# Patient Record
Sex: Female | Born: 1960 | Race: White | Hispanic: No | Smoking: Current every day smoker
Health system: Southern US, Community
[De-identification: ages and names within clinical notes are randomized; demographics above are authoritative.]

## PROBLEM LIST (undated history)

## (undated) DIAGNOSIS — K7460 Unspecified cirrhosis of liver: Secondary | ICD-10-CM

## (undated) DIAGNOSIS — F1910 Other psychoactive substance abuse, uncomplicated: Secondary | ICD-10-CM

## (undated) DIAGNOSIS — H269 Unspecified cataract: Secondary | ICD-10-CM

## (undated) DIAGNOSIS — I10 Essential (primary) hypertension: Secondary | ICD-10-CM

## (undated) DIAGNOSIS — M1990 Unspecified osteoarthritis, unspecified site: Secondary | ICD-10-CM

## (undated) DIAGNOSIS — G5600 Carpal tunnel syndrome, unspecified upper limb: Secondary | ICD-10-CM

## (undated) DIAGNOSIS — B1920 Unspecified viral hepatitis C without hepatic coma: Secondary | ICD-10-CM

## (undated) HISTORY — PX: CATARACT EXTRACTION: SUR2

## (undated) HISTORY — PX: POLYPECTOMY: SHX149

## (undated) HISTORY — DX: Unspecified cirrhosis of liver: K74.60

## (undated) HISTORY — DX: Other psychoactive substance abuse, uncomplicated: F19.10

## (undated) HISTORY — DX: Essential (primary) hypertension: I10

## (undated) HISTORY — PX: CHOLECYSTECTOMY: SHX55

## (undated) HISTORY — PX: COLONOSCOPY: SHX174

## (undated) HISTORY — PX: ERCP: SHX60

---

## 1999-08-22 ENCOUNTER — Emergency Department (HOSPITAL_COMMUNITY): Admission: EM | Admit: 1999-08-22 | Discharge: 1999-08-22 | Payer: Self-pay | Admitting: Emergency Medicine

## 1999-08-23 ENCOUNTER — Encounter: Payer: Self-pay | Admitting: Emergency Medicine

## 2001-03-28 ENCOUNTER — Emergency Department (HOSPITAL_COMMUNITY): Admission: EM | Admit: 2001-03-28 | Discharge: 2001-03-28 | Payer: Self-pay | Admitting: Emergency Medicine

## 2001-05-28 ENCOUNTER — Emergency Department (HOSPITAL_COMMUNITY): Admission: EM | Admit: 2001-05-28 | Discharge: 2001-05-28 | Payer: Self-pay | Admitting: Emergency Medicine

## 2003-05-11 ENCOUNTER — Encounter: Payer: Self-pay | Admitting: Emergency Medicine

## 2003-05-11 ENCOUNTER — Emergency Department (HOSPITAL_COMMUNITY): Admission: AD | Admit: 2003-05-11 | Discharge: 2003-05-11 | Payer: Self-pay | Admitting: Emergency Medicine

## 2004-06-28 ENCOUNTER — Emergency Department (HOSPITAL_COMMUNITY): Admission: EM | Admit: 2004-06-28 | Discharge: 2004-06-28 | Payer: Self-pay | Admitting: *Deleted

## 2004-11-02 ENCOUNTER — Emergency Department (HOSPITAL_COMMUNITY): Admission: EM | Admit: 2004-11-02 | Discharge: 2004-11-02 | Payer: Self-pay | Admitting: Emergency Medicine

## 2004-11-02 IMAGING — CR DG CHEST 2V
2 series · 2 of 2 positions shown · non-contrast
Comparison: none

CLINICAL DATA: Chest pain.
 TWO-VIEW CHEST, [DATE]:
 The lungs are clear and the heart and mediastinal structures are normal.

[view not recorded (1 of 2)]
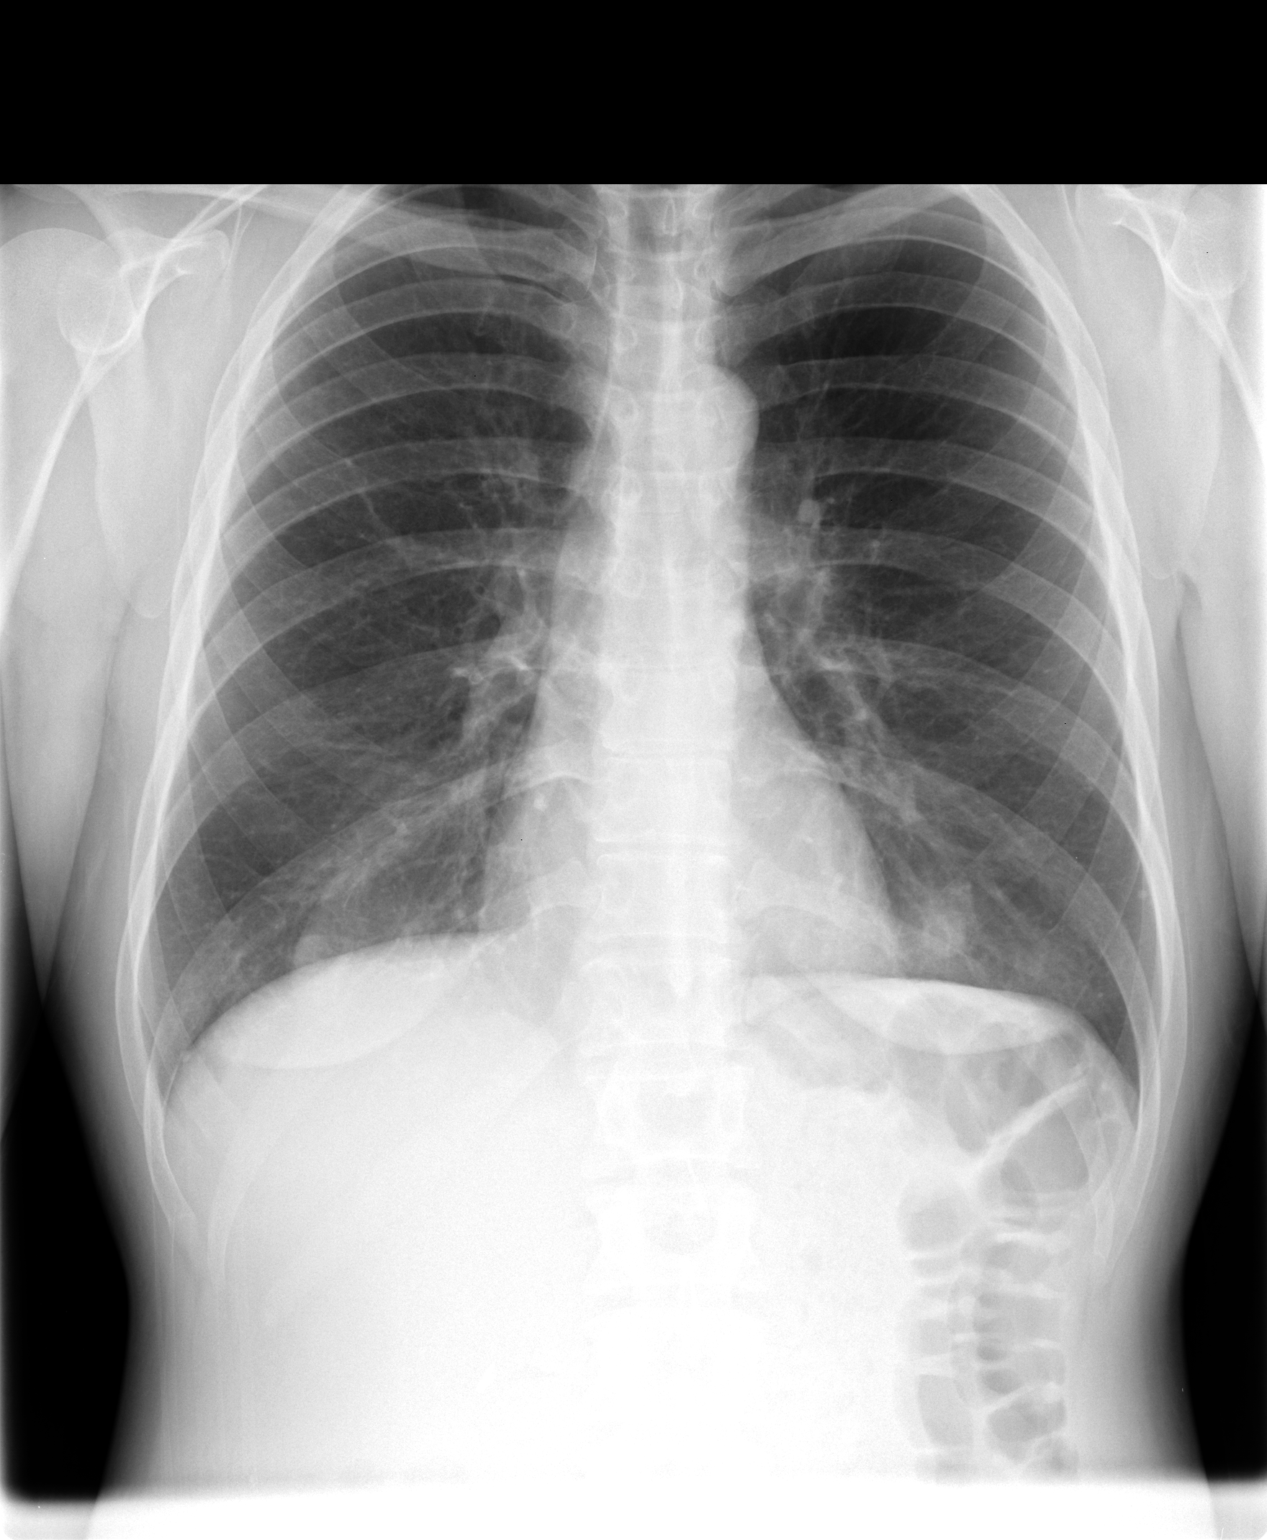

[view not recorded (2 of 2)]
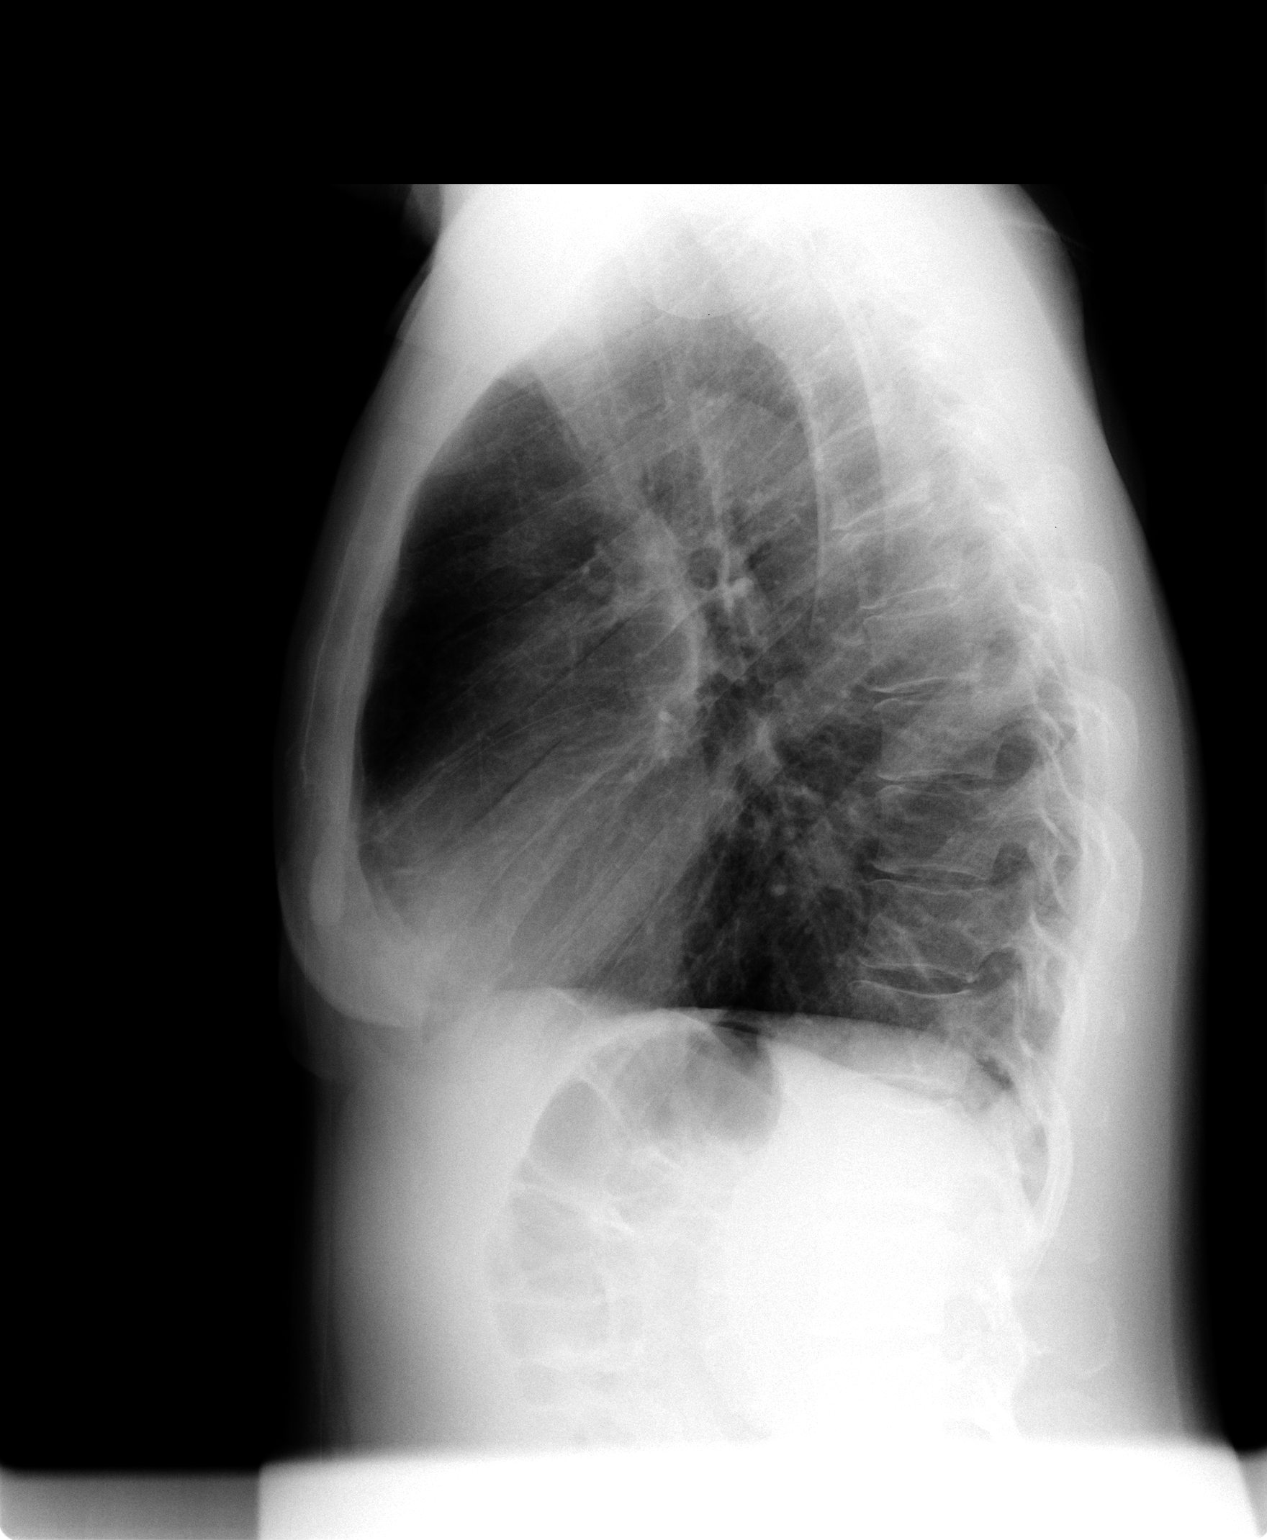

[2 of 2 positions shown; findings below may reference images not displayed]

IMPRESSION: No evidence for active chest disease.

## 2004-12-06 ENCOUNTER — Emergency Department (HOSPITAL_COMMUNITY): Admission: EM | Admit: 2004-12-06 | Discharge: 2004-12-06 | Payer: Self-pay | Admitting: Emergency Medicine

## 2009-06-16 IMAGING — CR DG FOOT COMPLETE 3+V*R*
3 series · 3 of 3 positions shown · non-contrast
Comparison: None

CLINICAL DATA: Right foot injury.

RIGHT FOOT COMPLETE - 3+ VIEW

[t foot ap right]
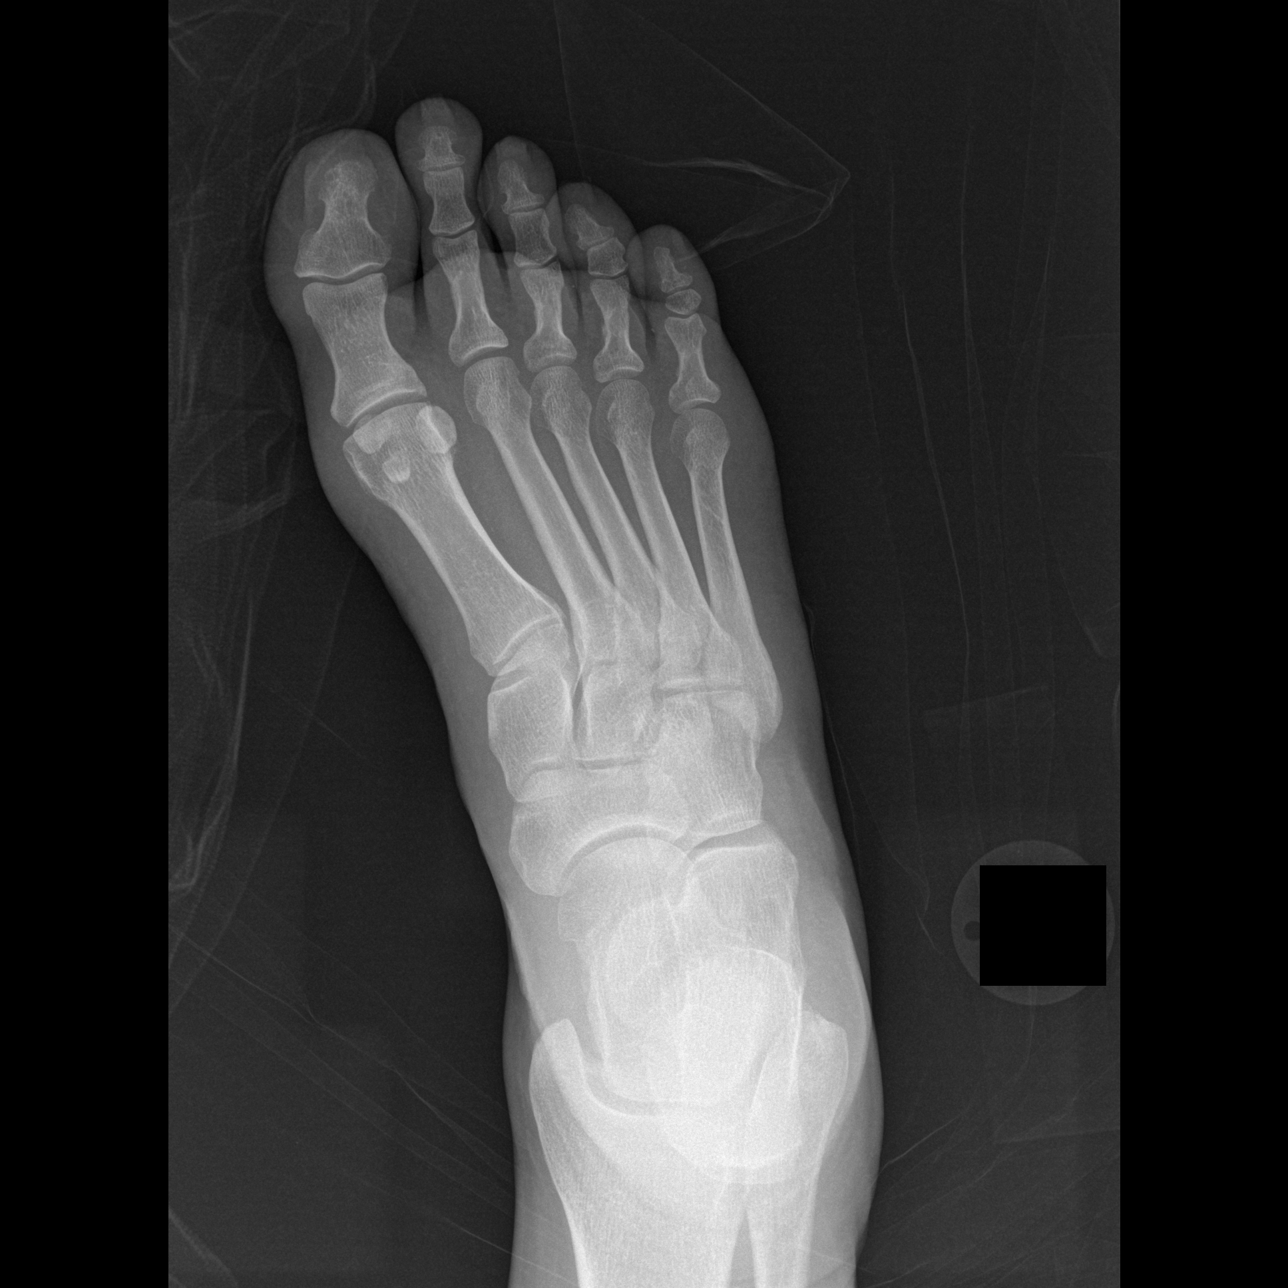

[t foot oblique right]
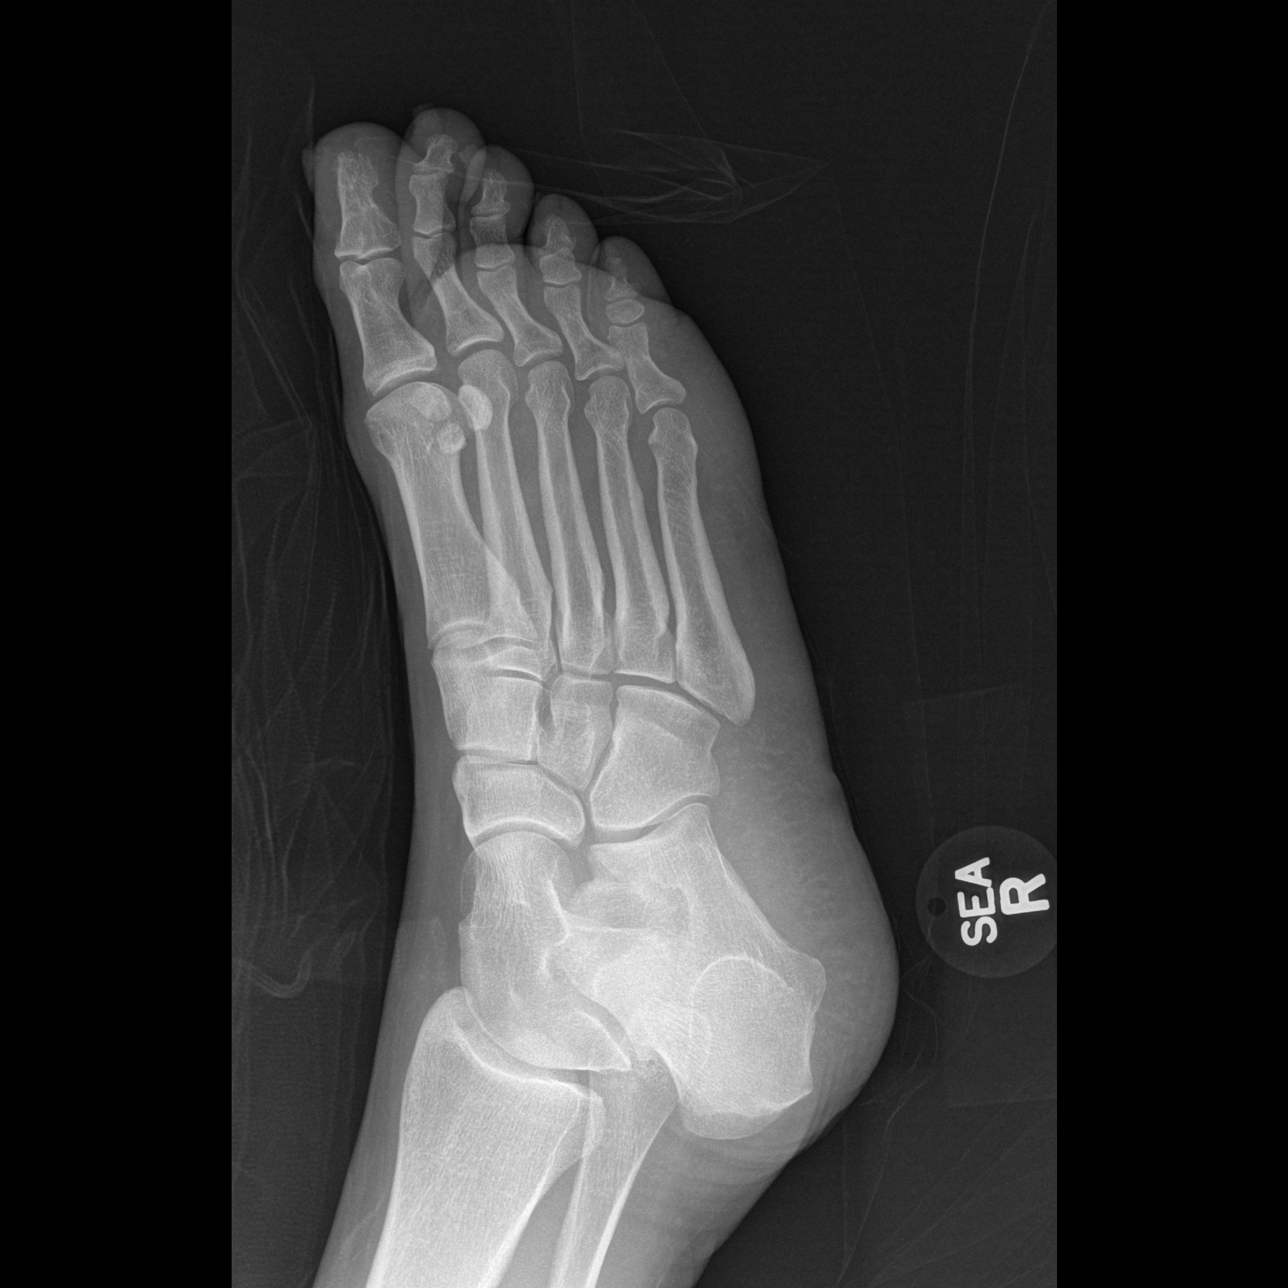

[view not recorded]
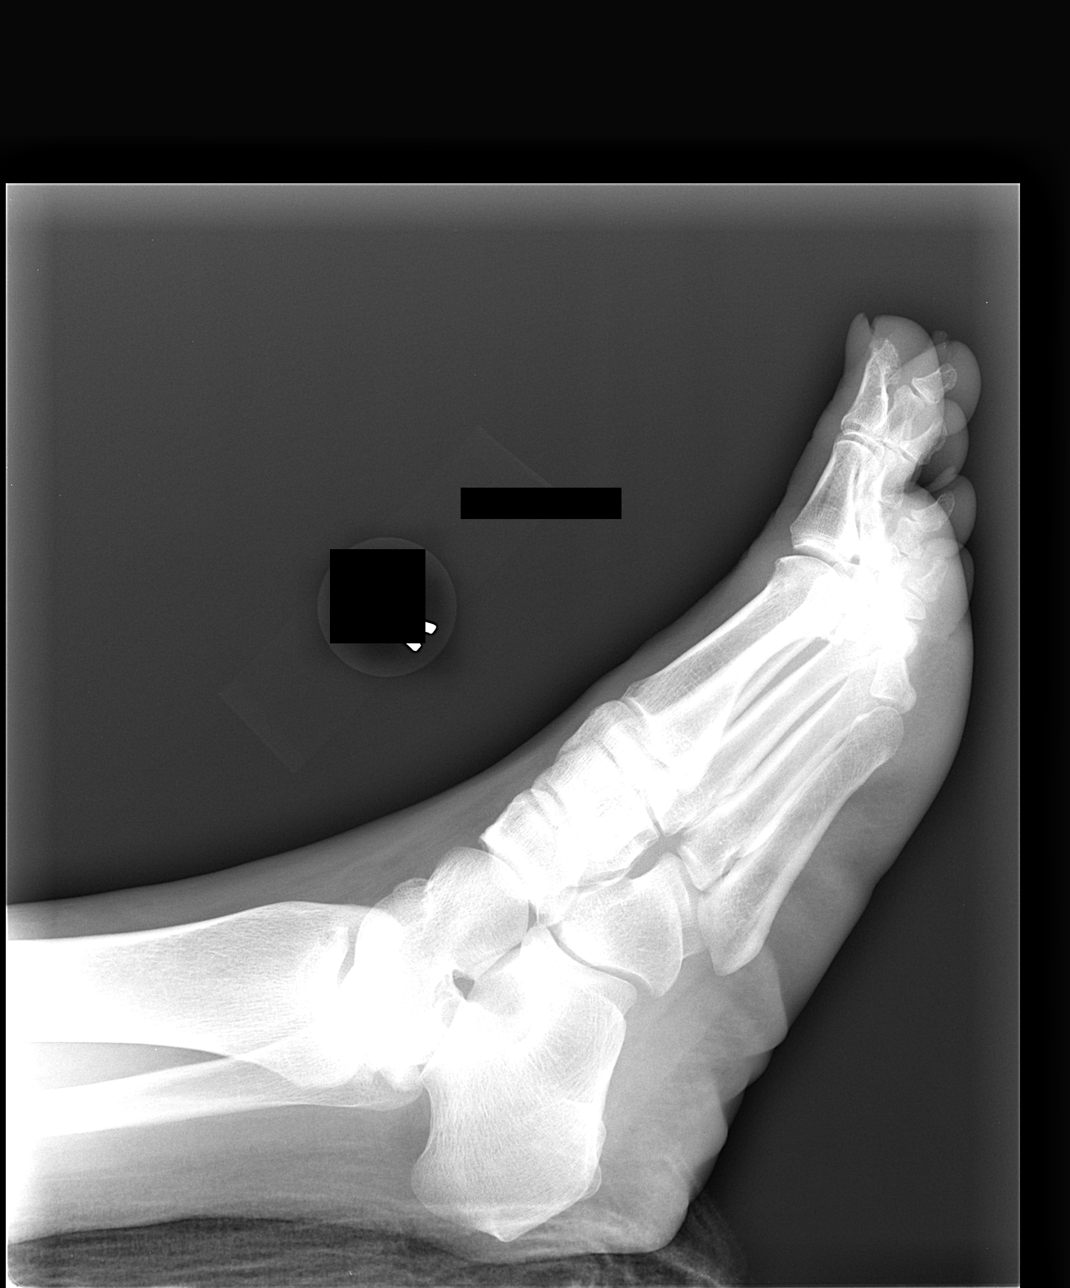

[3 of 3 positions shown; findings below may reference images not displayed]

FINDINGS: The joint spaces are maintained.  No definite fractures
are seen.
IMPRESSION: No acute bony findings.
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
## 2009-09-19 ENCOUNTER — Emergency Department (HOSPITAL_COMMUNITY): Admission: EM | Admit: 2009-09-19 | Discharge: 2009-09-20 | Payer: Self-pay | Admitting: Emergency Medicine

## 2009-09-19 IMAGING — CR DG CHEST 2V
2 series · 2 of 2 positions shown · non-contrast
Comparison: [DATE]

CLINICAL DATA: Congestion and productive cough for 2 days.

CHEST - 2 VIEW

[w chest pa]
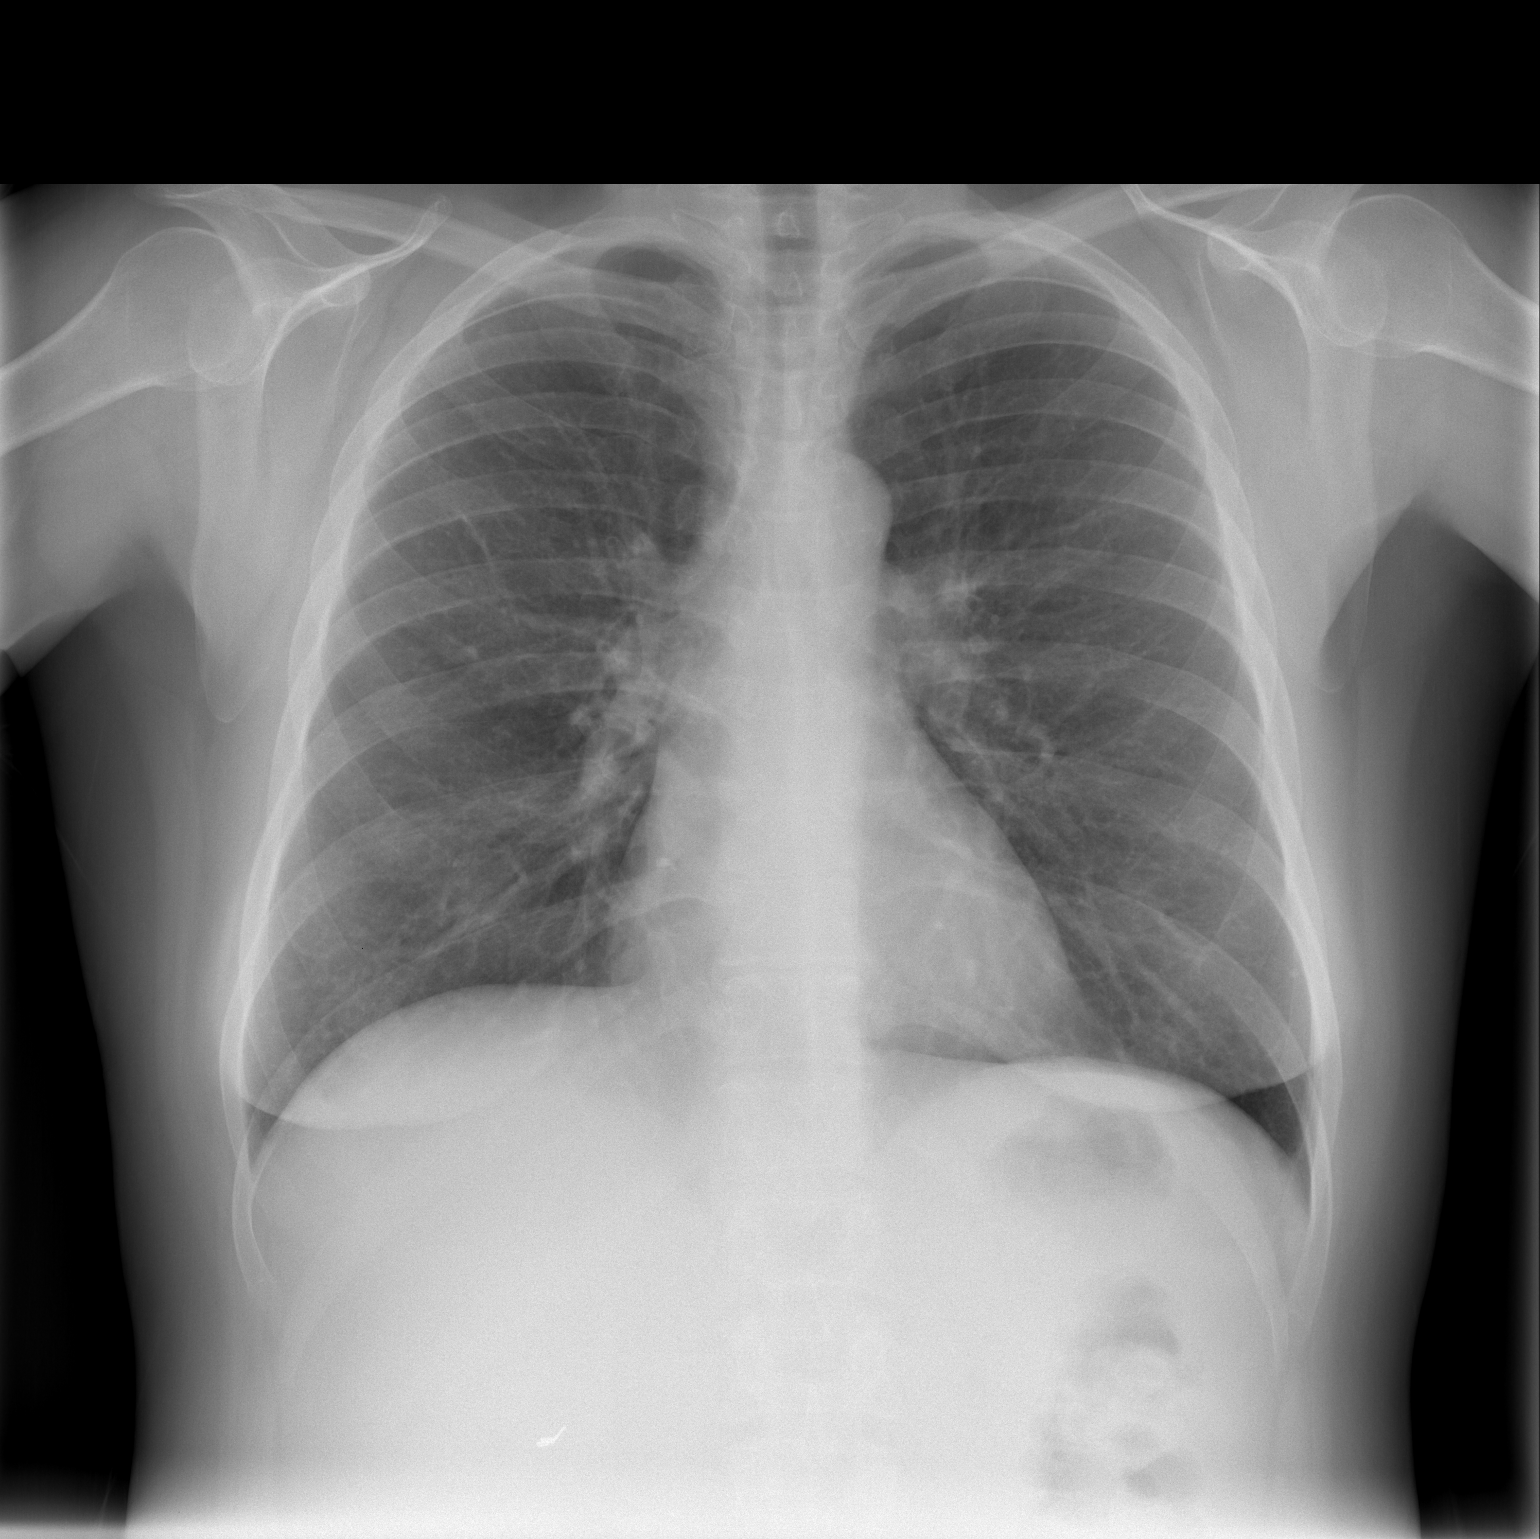

[w chest lat]
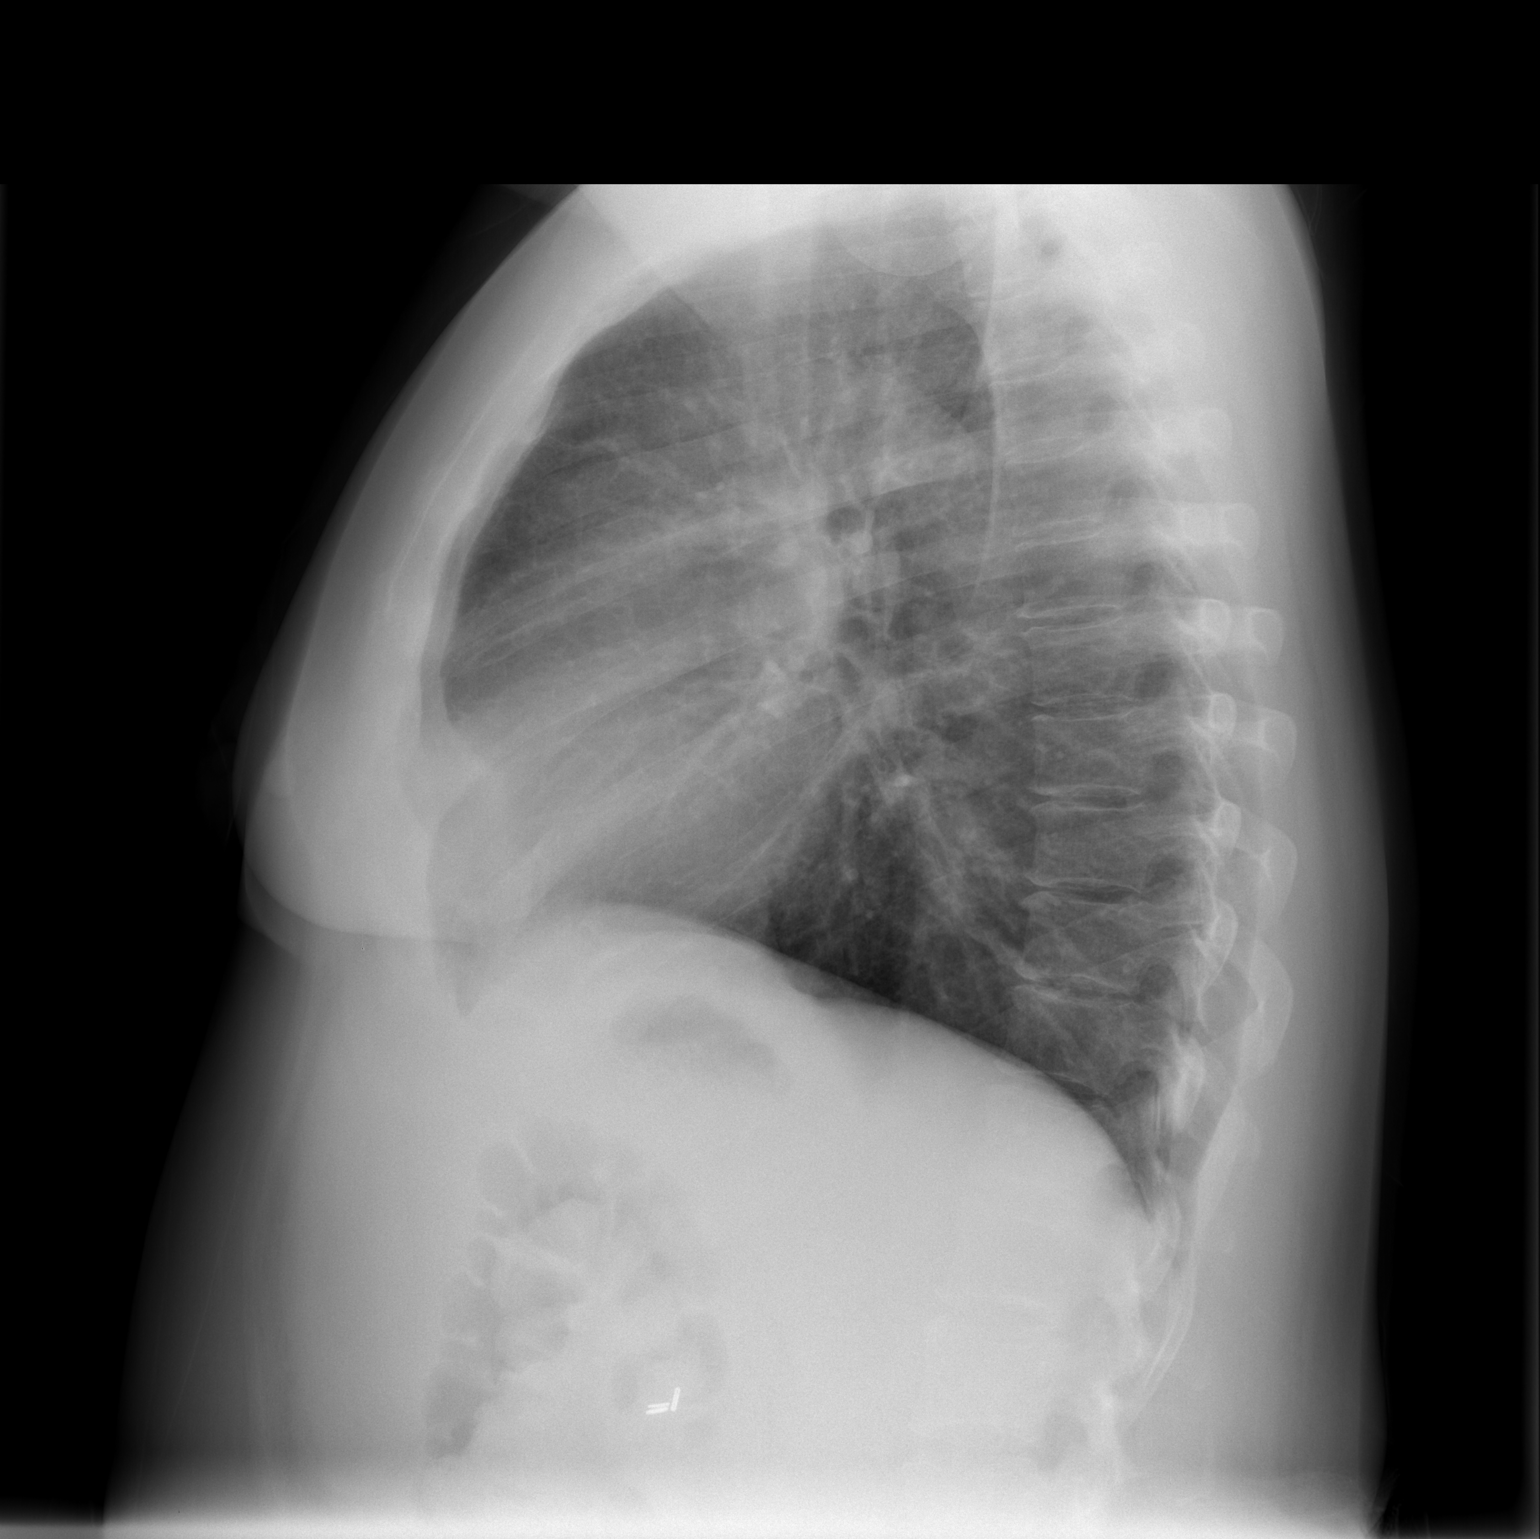

[2 of 2 positions shown; findings below may reference images not displayed]

FINDINGS: Midline trachea.  Normal heart size and mediastinal
contours. No pleural effusion or pneumothorax.  Clear lungs.

Prior cholecystectomy.
IMPRESSION: No acute cardiopulmonary disease.

## 2010-02-23 ENCOUNTER — Emergency Department (HOSPITAL_COMMUNITY): Admission: EM | Admit: 2010-02-23 | Discharge: 2010-02-24 | Payer: Self-pay | Admitting: Emergency Medicine
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
## 2010-02-23 IMAGING — CR DG CHEST 2V
2 series · 2 of 2 positions shown · non-contrast
Comparison: [DATE]

CLINICAL DATA: Fell.  Chest pain.

CHEST - 2 VIEW

[t chest supine]
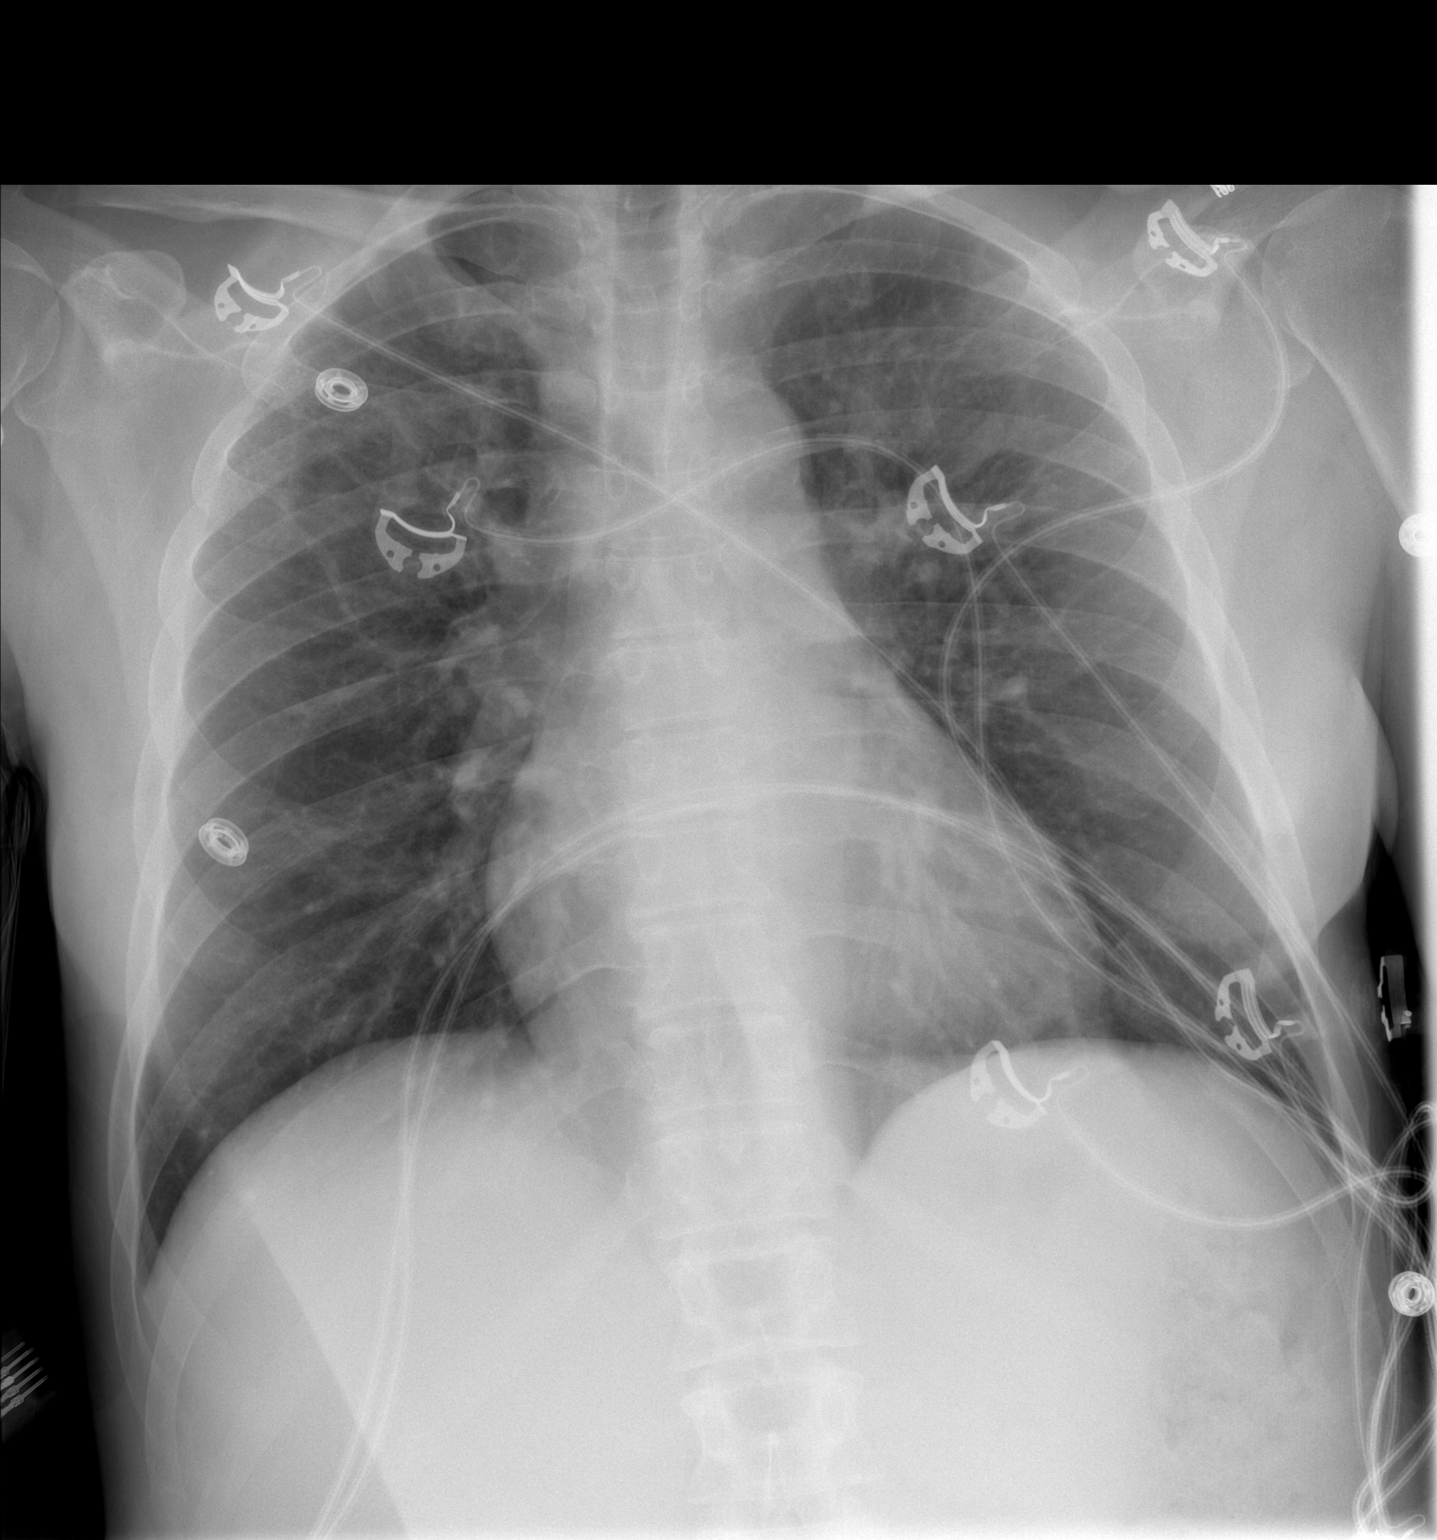

[w chest lat]
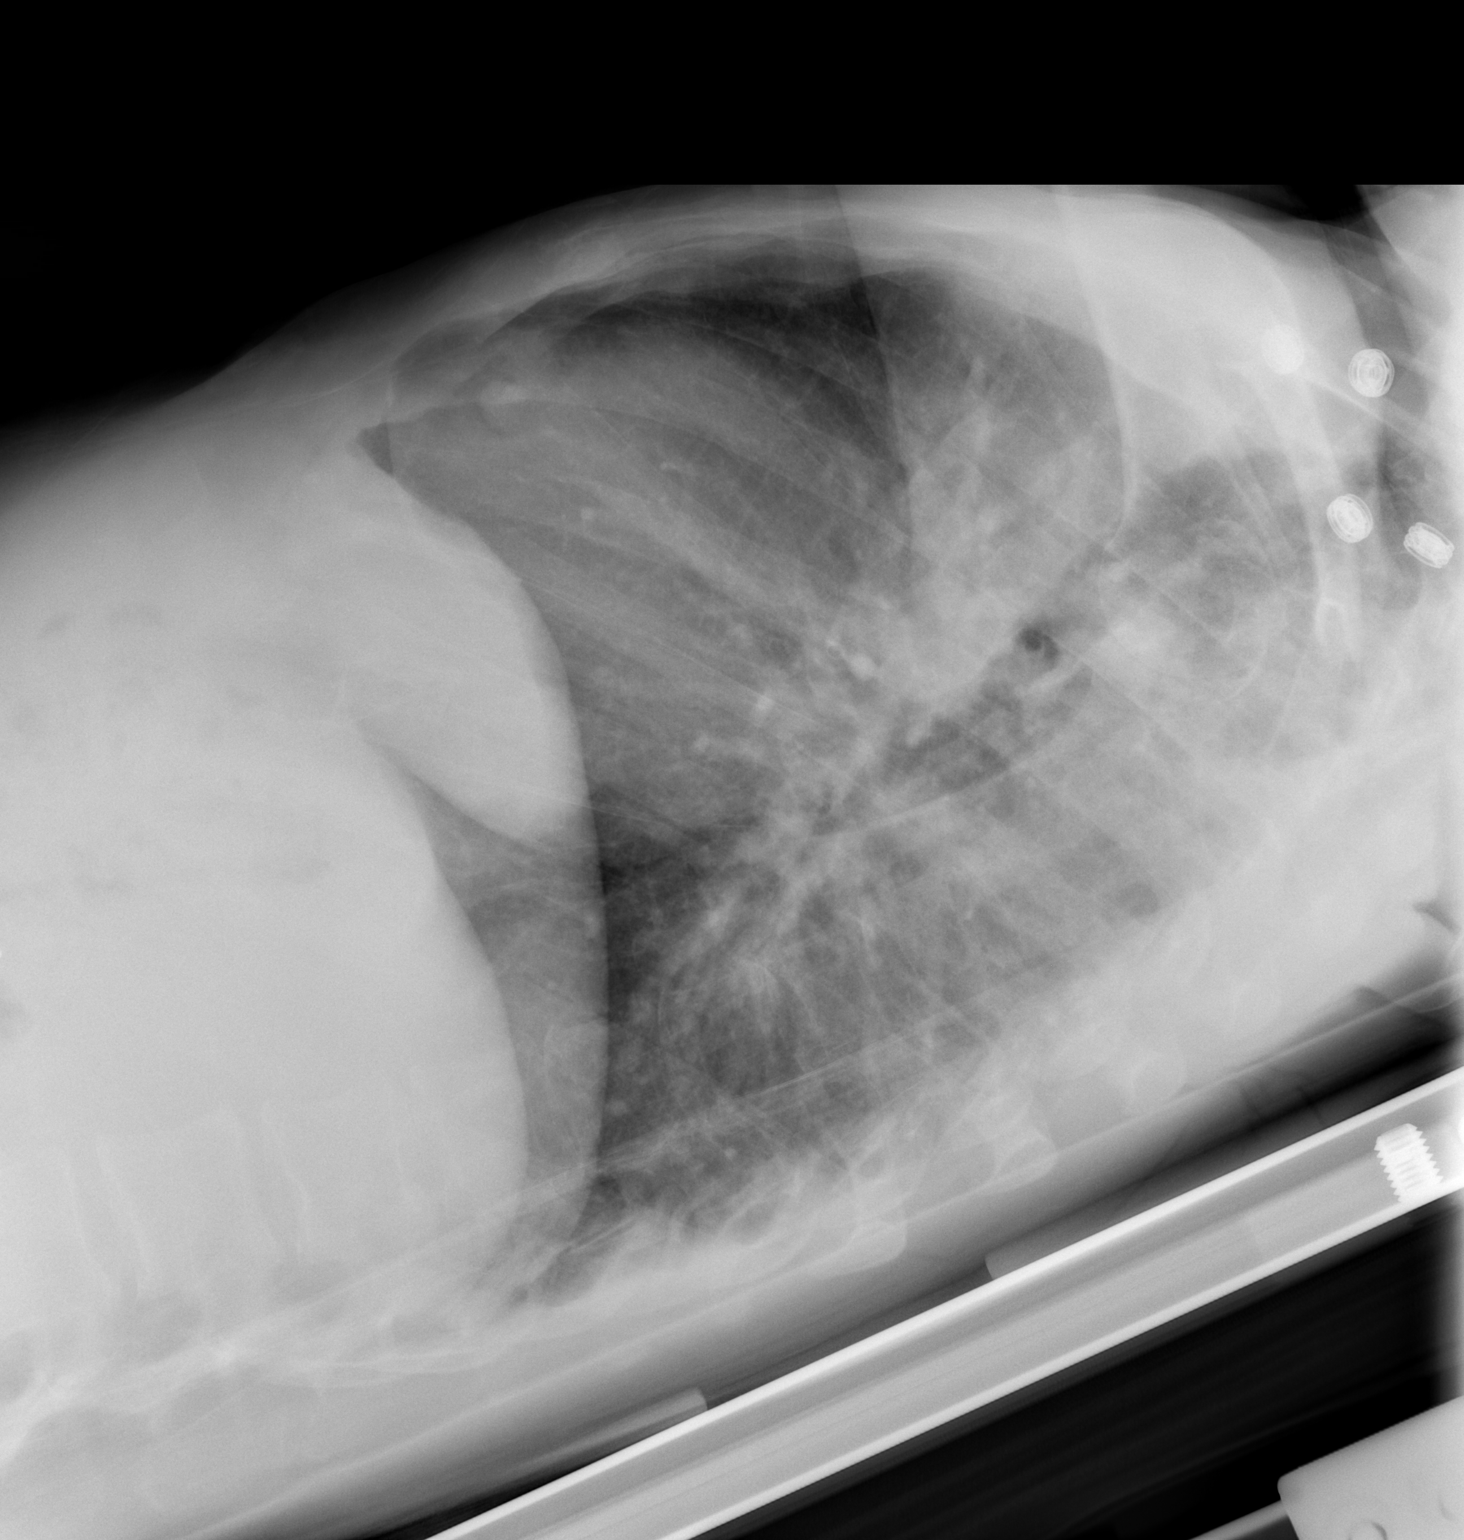

[2 of 2 positions shown; findings below may reference images not displayed]

FINDINGS: The cardiac silhouette, mediastinal and hilar contours
are within normal limits.  The lungs are clear.  The bony thorax is
intact.
IMPRESSION: No acute cardiopulmonary findings.
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
## 2010-02-23 IMAGING — CR DG ELBOW COMPLETE 3+V*R*
4 series · 4 of 4 positions shown · non-contrast
Comparison: None.

CLINICAL DATA: Right elbow pain, trauma

RIGHT ELBOW - COMPLETE 3+ VIEW

[t elbow (1 of 4)]
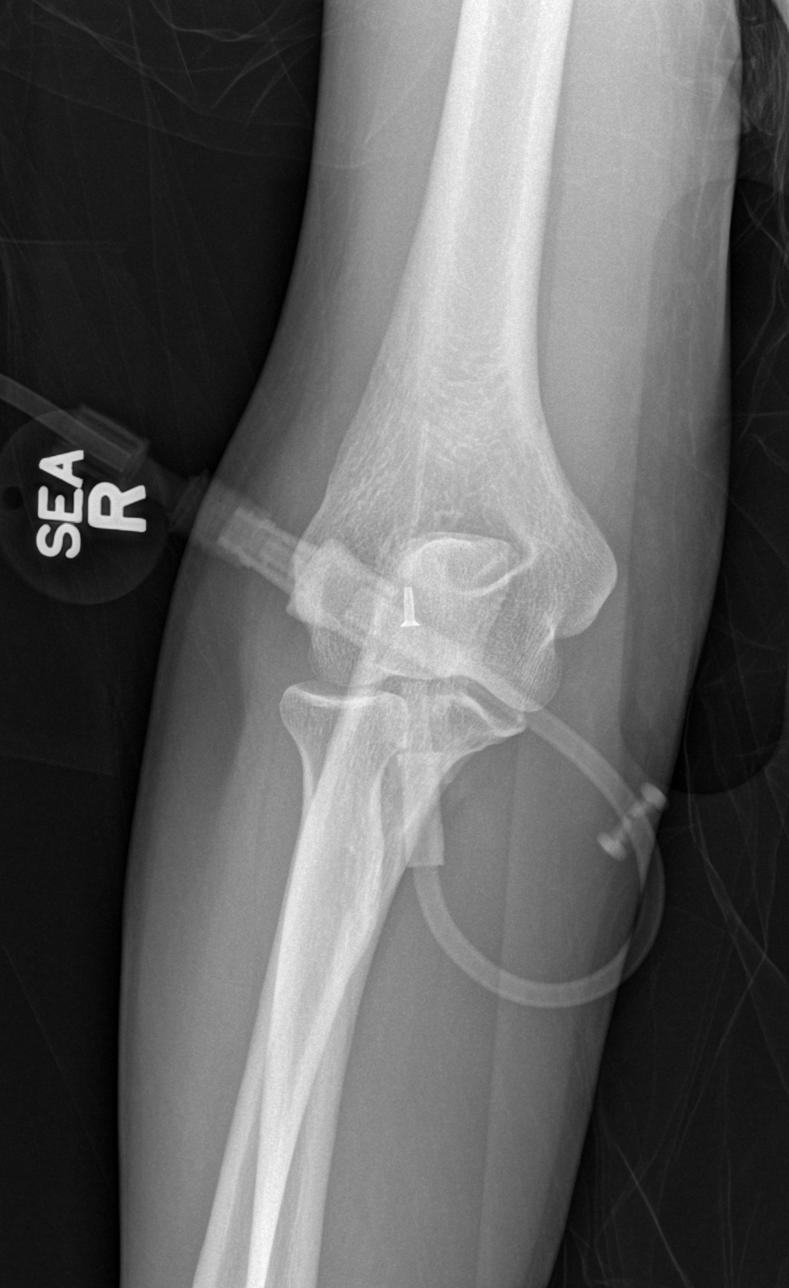

[t elbow (2 of 4)]
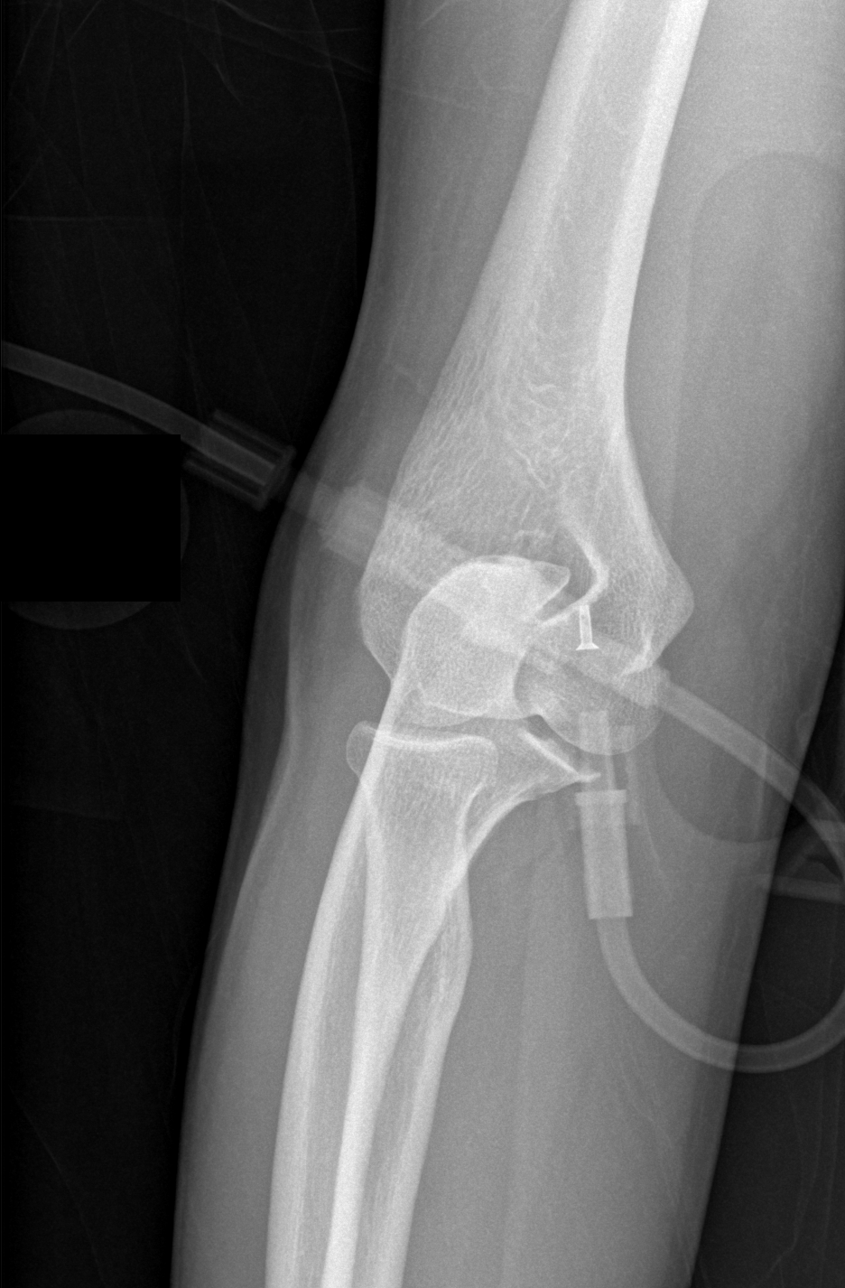

[t elbow (3 of 4)]
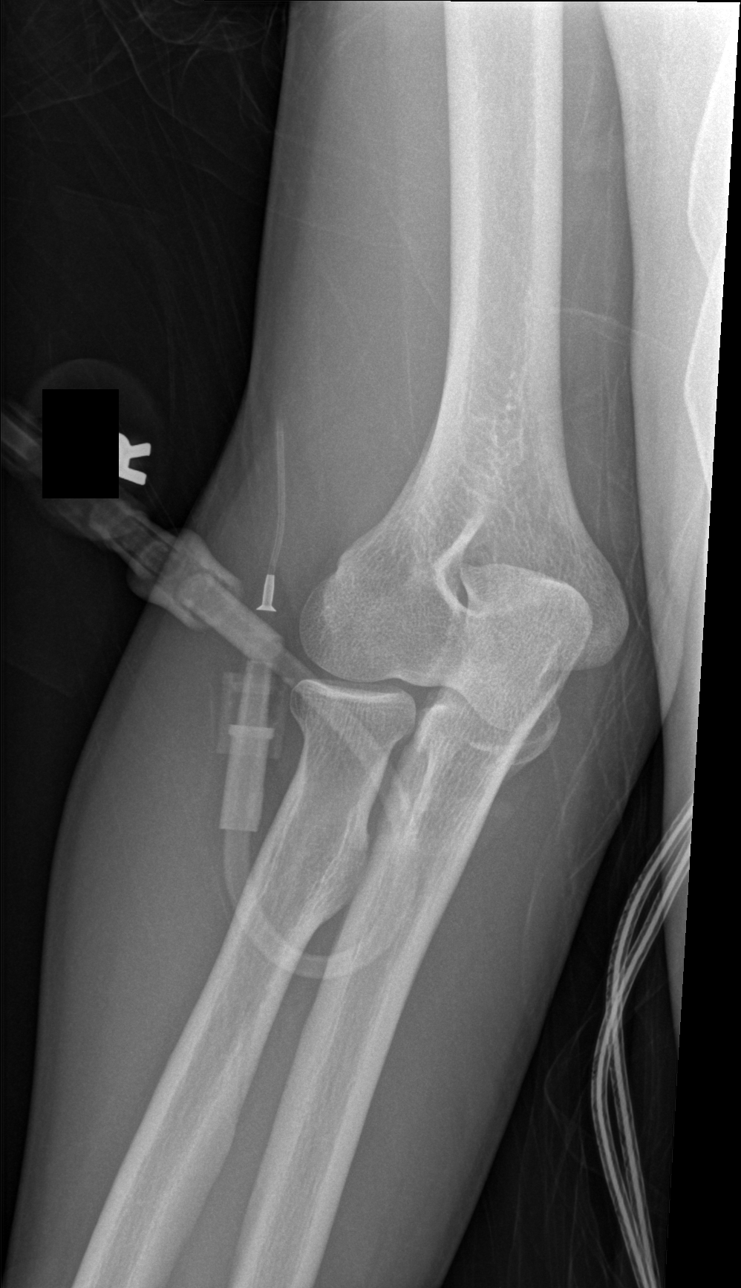

[t elbow (4 of 4)]
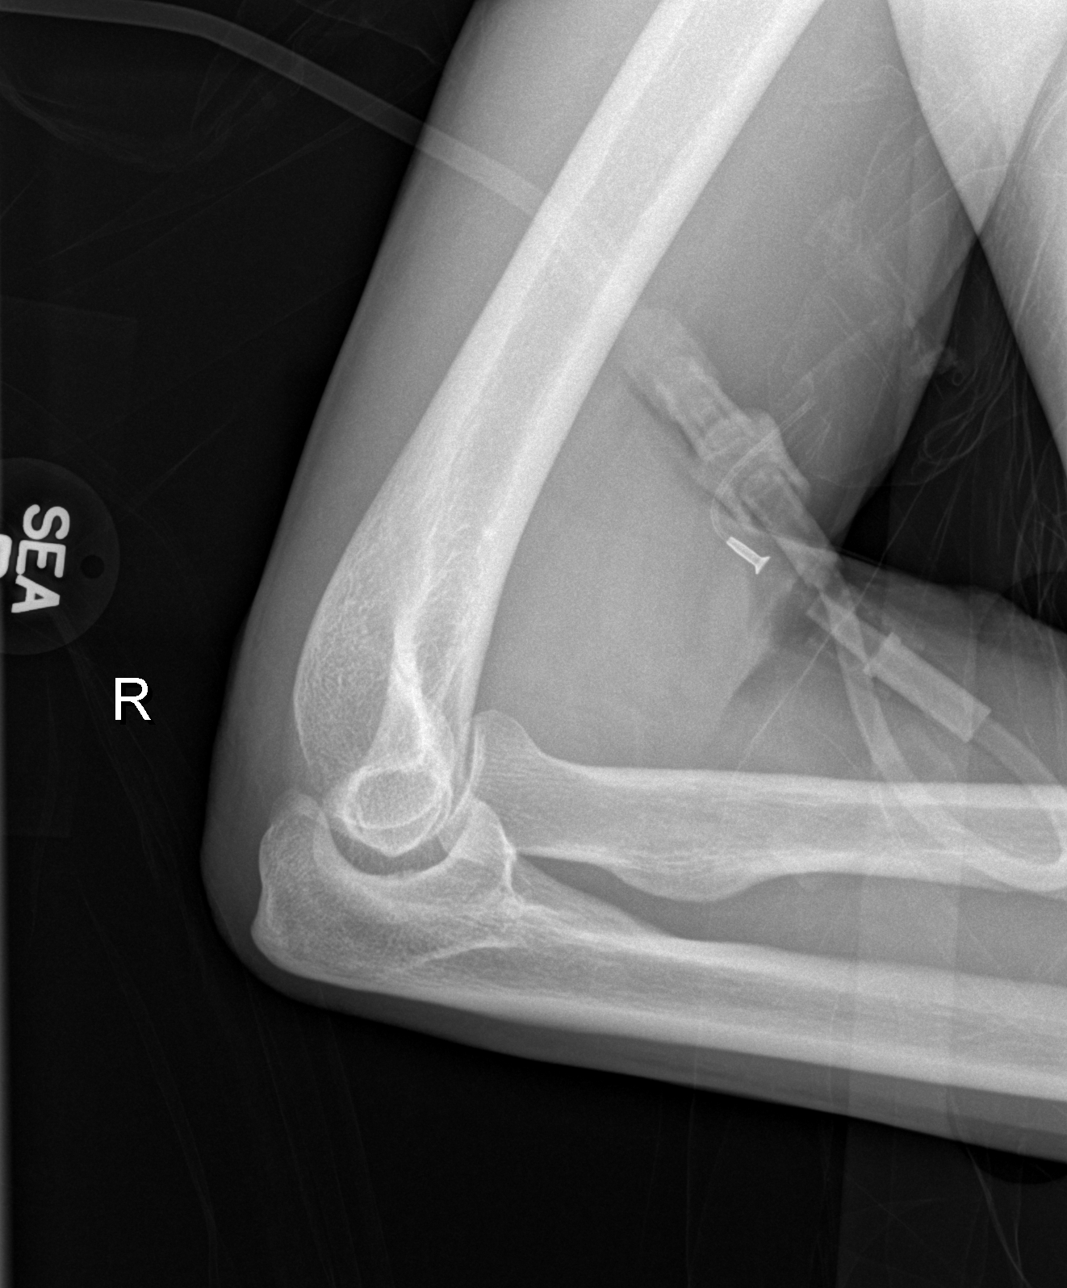

[4 of 4 positions shown; findings below may reference images not displayed]

FINDINGS: IV tubing overlies the elbow and obscures detail.  No
fracture or dislocation is identified.  No joint effusion.
IMPRESSION: No acute bony abnormality.
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
## 2010-03-04 ENCOUNTER — Emergency Department (HOSPITAL_COMMUNITY): Admission: EM | Admit: 2010-03-04 | Discharge: 2010-03-04 | Payer: Self-pay | Admitting: Emergency Medicine

## 2010-12-25 ENCOUNTER — Emergency Department (HOSPITAL_COMMUNITY)
Admission: EM | Admit: 2010-12-25 | Discharge: 2010-12-25 | Disposition: A | Discharge status: Home / Self Care | Payer: Self-pay | Attending: Emergency Medicine | Admitting: Emergency Medicine

## 2010-12-25 DIAGNOSIS — L0201 Cutaneous abscess of face: Secondary | ICD-10-CM | POA: Insufficient documentation

## 2010-12-25 DIAGNOSIS — H5789 Other specified disorders of eye and adnexa: Secondary | ICD-10-CM | POA: Insufficient documentation

## 2010-12-25 DIAGNOSIS — L03211 Cellulitis of face: Secondary | ICD-10-CM | POA: Insufficient documentation

## 2011-02-04 LAB — POCT I-STAT, CHEM 8
BUN: 15 mg/dL (ref 6–23)
Chloride: 109 mEq/L (ref 96–112)
Creatinine, Ser: 0.9 mg/dL (ref 0.4–1.2)
Glucose, Bld: 111 mg/dL — ABNORMAL HIGH (ref 70–99)
Potassium: 4.1 mEq/L (ref 3.5–5.1)
Sodium: 142 mEq/L (ref 135–145)

## 2011-02-04 LAB — URINALYSIS, ROUTINE W REFLEX MICROSCOPIC: pH: 6.5 (ref 5.0–8.0)

## 2011-02-04 LAB — URINE MICROSCOPIC-ADD ON

## 2020-08-31 ENCOUNTER — Ambulatory Visit (HOSPITAL_COMMUNITY)
Admission: EM | Admit: 2020-08-31 | Discharge: 2020-08-31 | Disposition: A | Discharge status: Home / Self Care | Payer: Self-pay | Attending: Emergency Medicine | Admitting: Emergency Medicine

## 2020-08-31 ENCOUNTER — Ambulatory Visit (INDEPENDENT_AMBULATORY_CARE_PROVIDER_SITE_OTHER): Payer: Self-pay

## 2020-08-31 ENCOUNTER — Encounter (HOSPITAL_COMMUNITY): Payer: Self-pay | Admitting: Emergency Medicine

## 2020-08-31 ENCOUNTER — Other Ambulatory Visit: Payer: Self-pay

## 2020-08-31 DIAGNOSIS — M7989 Other specified soft tissue disorders: Secondary | ICD-10-CM

## 2020-08-31 DIAGNOSIS — M79671 Pain in right foot: Secondary | ICD-10-CM

## 2020-08-31 IMAGING — DX DG FOOT COMPLETE 3+V*R*
3 series · 3 of 3 positions shown · non-contrast
Comparison: [DATE]

CLINICAL DATA: Pain and swelling

EXAM:
RIGHT FOOT COMPLETE - 3+ VIEW

[foot ap]
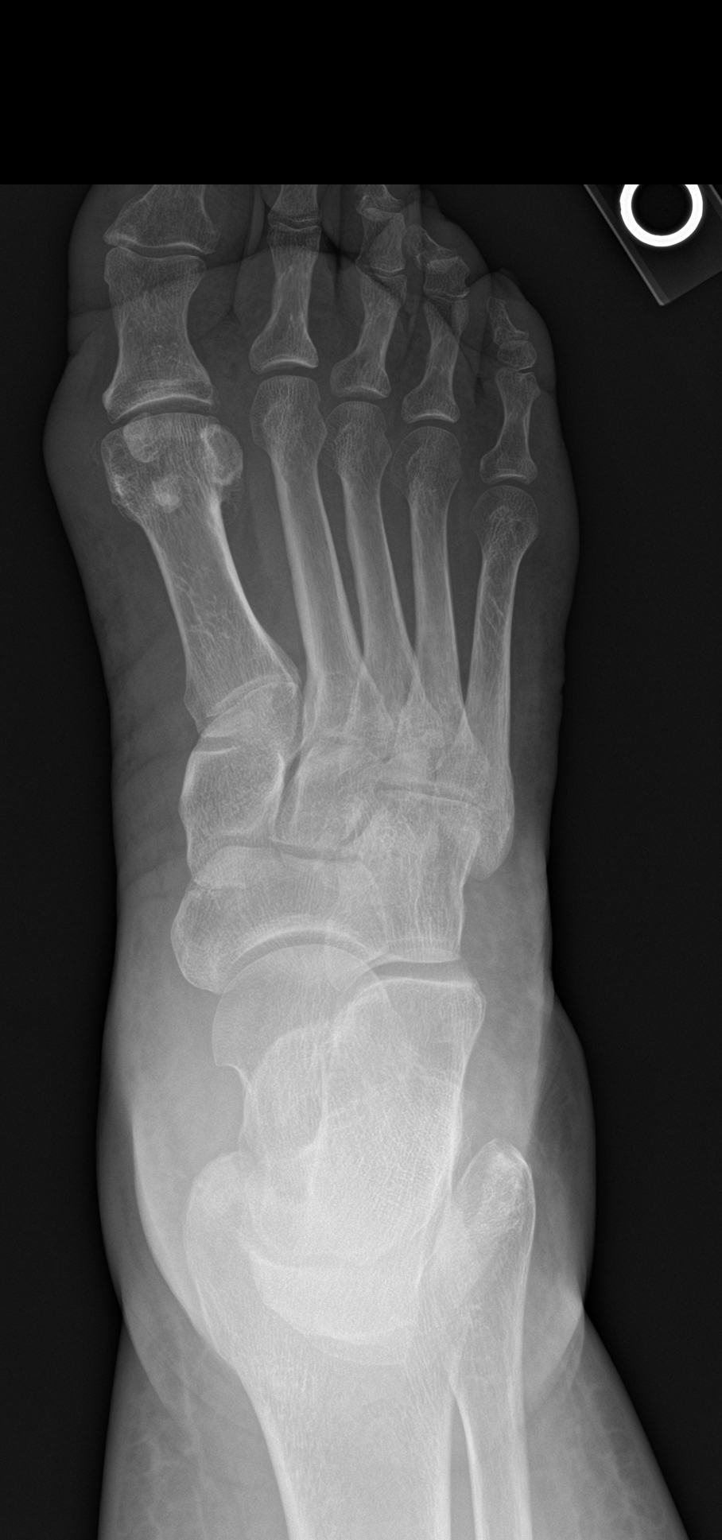

[foot obl]
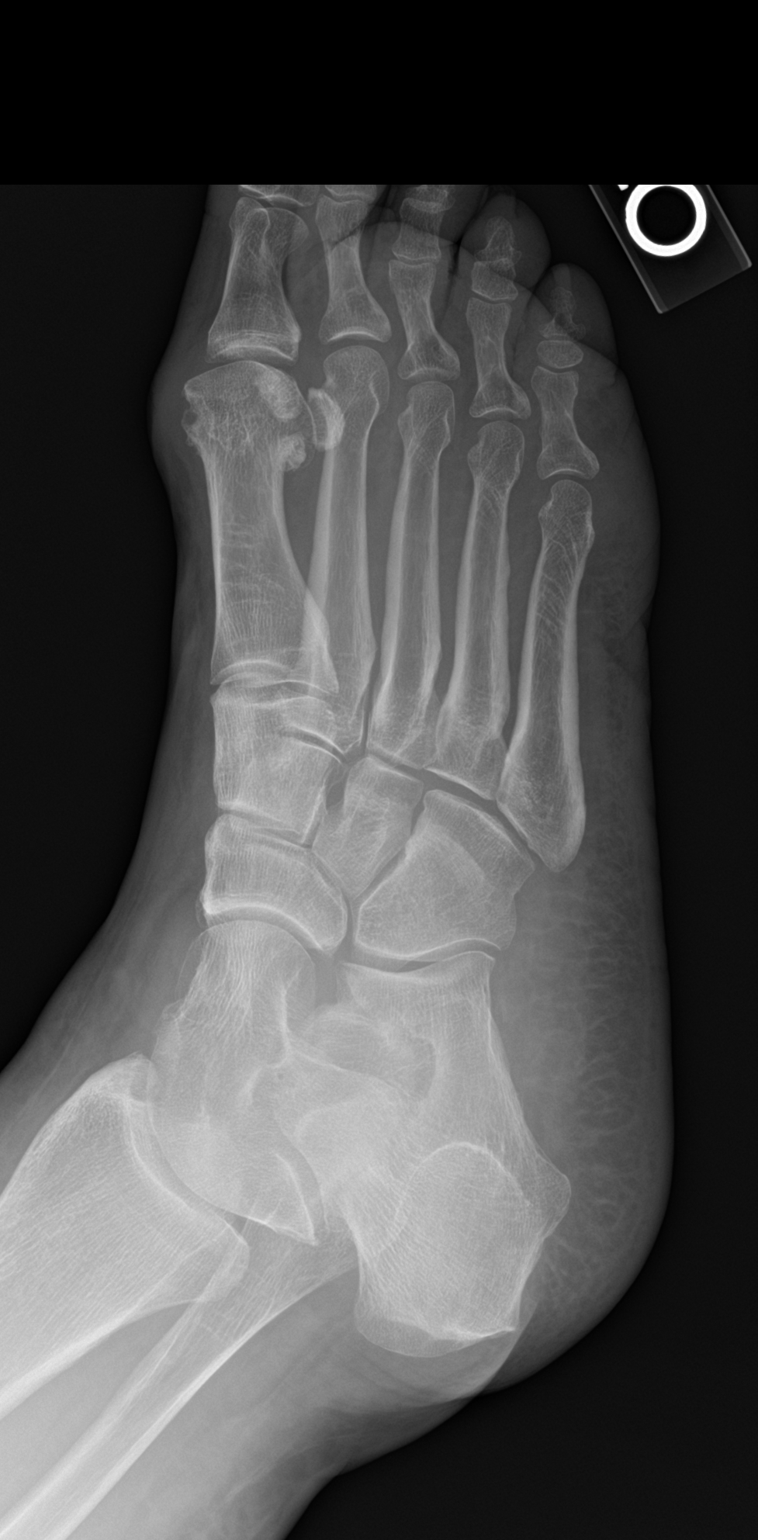

[foot lat]
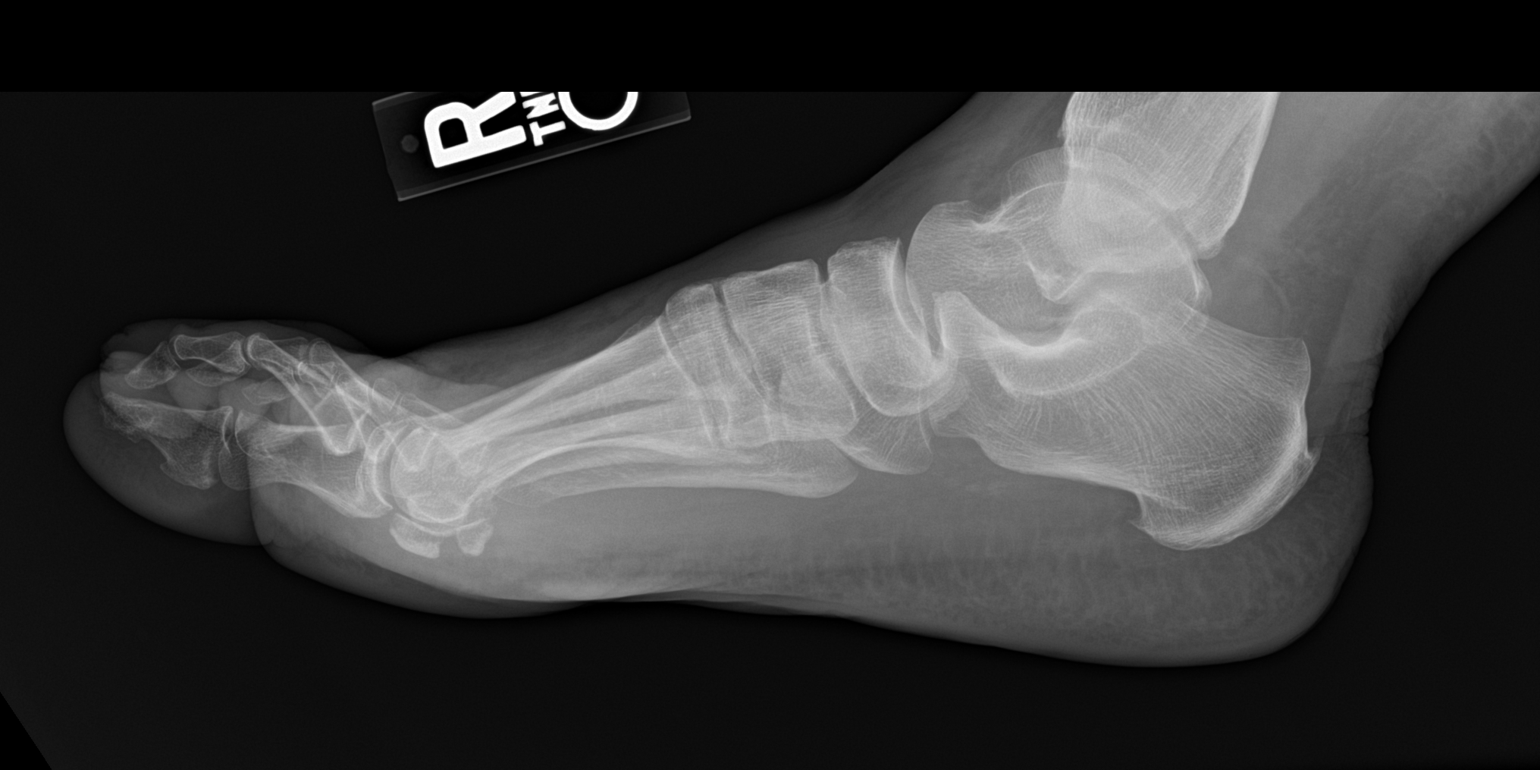

[3 of 3 positions shown; findings below may reference images not displayed]

FINDINGS: Frontal, oblique, and lateral views were obtained. There is no
fracture or dislocation. There is no appreciable joint space
narrowing. There is a small erosion along the distal aspect of the
distal first metatarsal with soft tissue swelling medial to the
first MTP joint. No similar erosion elsewhere. There are small
posterior and inferior calcaneal spurs.
IMPRESSION: Small erosion along the medial distal first metatarsal with soft
tissue swelling. This appearance raises concern for potential gout.
Appropriate laboratory correlation in this regard advised.

No appreciable joint space narrowing. No other evidence of erosion.
No fracture or dislocation. There are small calcaneal spurs.

## 2020-08-31 MED ORDER — NAPROXEN 500 MG PO TABS: 500.0000 mg | ORAL_TABLET | Freq: Two times a day (BID) | ORAL | 0 refills | Status: DC

## 2020-08-31 NOTE — ED Provider Notes (Signed)
Samantha Evans    CSN: [id number] Arrival date & time: 08/31/20  1031      History   Chief Complaint Chief Complaint  Patient presents with  . Ankle Pain    HPI Samantha Evans is a 59 y.o. female.   Samantha Evans presents with complaints of right foot pain, worse at end of day. Started three weeks ago while on her feet at work, without any specific injury. Pain with motion and weight bearing. Denies any previous similar. No redness or warmth. No numbness or tingling. Has occasionally tried take over the counter medications for pain which has minimally helped.    ROS per HPI, negative if not otherwise mentioned.      History reviewed. No pertinent past medical history.  There are no problems to display for this patient.   History reviewed. No pertinent surgical history.  OB History   No obstetric history on file.      Home Medications    Prior to Admission medications   Medication Sig Start Date End Date Taking? Authorizing Provider  naproxen (NAPROSYN) 500 MG tablet Take 1 tablet (500 mg total) by mouth 2 (two) times daily. 08/31/20   Zigmund Gottron, NP    Family History Family History  Problem Relation Age of Onset  . Cancer Father     Social History Social History   Tobacco Use  . Smoking status: Current Every Day Smoker  . Smokeless tobacco: Never Used  Substance Use Topics  . Alcohol use: Not Currently  . Drug use: Not on file     Allergies   Patient has no allergy information on record.   Review of Systems Review of Systems   Physical Exam Triage Vital Signs ED Triage Vitals  Enc Vitals Group     BP 08/31/20 1222 (!) 179/72     Pulse Rate 08/31/20 1222 65     Resp 08/31/20 1222 16     Temp 08/31/20 1222 (!) 97.5 F (36.4 C)     Temp Source 08/31/20 1222 Oral     SpO2 08/31/20 1222 99 %     Weight --      Height --      Head Circumference --      Peak Flow --      Pain Score 08/31/20 1220 6     Pain Loc --       Pain Edu? --      Excl. in [place]? --    No data found.  Updated Vital Signs BP (!) 179/72 (BP Location: Right Arm)   Pulse 65   Temp (!) 97.5 F (36.4 C) (Oral)   Resp 16   SpO2 99%   Visual Acuity Right Eye Distance:   Left Eye Distance:   Bilateral Distance:    Right Eye Near:   Left Eye Near:    Bilateral Near:     Physical Exam Constitutional:      General: She is not in acute distress.    Appearance: She is well-developed.  Cardiovascular:     Rate and Rhythm: Normal rate.  Pulmonary:     Effort: Pulmonary effort is normal.  Musculoskeletal:     Right foot: Normal range of motion.       Feet:  Feet:     Right foot:     Skin integrity: Skin integrity normal. No skin breakdown, erythema or warmth.     Comments: Tenderness to plantar aspect of right foot along  arch; ankle WNL; no heel pain; no redness swelling or warmth; cap refill < 2 seconds   Skin:    General: Skin is warm and dry.  Neurological:     Mental Status: She is alert and oriented to person, place, and time.      UC Treatments / Results  Labs (all labs ordered are listed, but only abnormal results are displayed) Labs Reviewed - No data to display  EKG   Radiology DG Foot Complete Right  Result Date: 08/31/2020 CLINICAL DATA:  Pain and swelling EXAM: RIGHT FOOT COMPLETE - 3+ VIEW COMPARISON:  February 23, 2010 FINDINGS: Frontal, oblique, and lateral views were obtained. There is no fracture or dislocation. There is no appreciable joint space narrowing. There is a small erosion along the distal aspect of the distal first metatarsal with soft tissue swelling medial to the first MTP joint. No similar erosion elsewhere. There are small posterior and inferior calcaneal spurs. IMPRESSION: Small erosion along the medial distal first metatarsal with soft tissue swelling. This appearance raises concern for potential gout. Appropriate laboratory correlation in this regard advised. No appreciable joint space  narrowing. No other evidence of erosion. No fracture or dislocation. There are small calcaneal spurs. Electronically Signed   By: Lowella Grip III M.D.   On: 08/31/2020 13:33    Procedures Procedures (including critical care time)  Medications Ordered in UC Medications - No data to display  Initial Impression / Assessment and Plan / UC Course  I have reviewed the triage vital signs and the nursing notes.  Pertinent labs & imaging results that were available during my care of the patient were reviewed by me and considered in my medical decision making (see chart for details).     Xray with changes at distal first metatarsal. Exam is not necessarily consistent with gout at this time, without redness swelling or warmth, no MTP joint tenderness, symptoms started 3 weeks ago. Will treat for arthritis at this time with recommendations to follow up with podiatry prn. Supportive shoes, ice recommended. Patient verbalized understanding and agreeable to plan.  Ambulatory out of clinic without difficulty.    Final Clinical Impressions(s) / UC Diagnoses   Final diagnoses:  Right foot pain     Discharge Instructions     You have findings consistent with gout, on xray, which is an arthritis- swelling- to your foot.  We will start an antiinflammatory medication to help with pain and swelling to see if this is helpful.  Activity as tolerated.  Ice application, especially after activity on your foot.  Please follow up with podiatry as needed if symptoms persist.  Wear well supportive shoes with adequate arch support, especially while working.     ED Prescriptions    Medication Sig Dispense Auth. Provider   naproxen (NAPROSYN) 500 MG tablet Take 1 tablet (500 mg total) by mouth 2 (two) times daily. 30 tablet Zigmund Gottron, NP     PDMP not reviewed this encounter.   Zigmund Gottron, NP 09/01/20 1013

## 2020-08-31 NOTE — ED Triage Notes (Signed)
Pt presents with right ankle pain, swelling at night xs 3 weeks.

## 2020-08-31 NOTE — Discharge Instructions (Signed)
You have findings consistent with gout, on xray, which is an arthritis- swelling- to your foot.  We will start an antiinflammatory medication to help with pain and swelling to see if this is helpful.  Activity as tolerated.  Ice application, especially after activity on your foot.  Please follow up with podiatry as needed if symptoms persist.  Wear well supportive shoes with adequate arch support, especially while working.

## 2020-12-17 DIAGNOSIS — Z419 Encounter for procedure for purposes other than remedying health state, unspecified: Secondary | ICD-10-CM | POA: Diagnosis not present

## 2021-01-13 ENCOUNTER — Other Ambulatory Visit: Payer: Self-pay

## 2021-01-13 ENCOUNTER — Ambulatory Visit: Payer: Medicaid Other | Attending: Family Medicine | Admitting: Family Medicine

## 2021-01-14 DIAGNOSIS — Z419 Encounter for procedure for purposes other than remedying health state, unspecified: Secondary | ICD-10-CM | POA: Diagnosis not present

## 2021-02-14 DIAGNOSIS — Z419 Encounter for procedure for purposes other than remedying health state, unspecified: Secondary | ICD-10-CM | POA: Diagnosis not present

## 2021-02-23 ENCOUNTER — Encounter (HOSPITAL_COMMUNITY): Payer: Self-pay | Admitting: Emergency Medicine

## 2021-02-23 ENCOUNTER — Emergency Department (HOSPITAL_COMMUNITY)
Admission: EM | Admit: 2021-02-23 | Discharge: 2021-02-23 | Disposition: A | Discharge status: Home / Self Care | Payer: Medicaid Other | Attending: Emergency Medicine | Admitting: Emergency Medicine

## 2021-02-23 DIAGNOSIS — K429 Umbilical hernia without obstruction or gangrene: Secondary | ICD-10-CM | POA: Insufficient documentation

## 2021-02-23 DIAGNOSIS — R7989 Other specified abnormal findings of blood chemistry: Secondary | ICD-10-CM | POA: Diagnosis not present

## 2021-02-23 DIAGNOSIS — F17200 Nicotine dependence, unspecified, uncomplicated: Secondary | ICD-10-CM | POA: Diagnosis not present

## 2021-02-23 DIAGNOSIS — R1110 Vomiting, unspecified: Secondary | ICD-10-CM | POA: Diagnosis present

## 2021-02-23 DIAGNOSIS — K760 Fatty (change of) liver, not elsewhere classified: Secondary | ICD-10-CM | POA: Diagnosis not present

## 2021-02-23 LAB — URINALYSIS, ROUTINE W REFLEX MICROSCOPIC
Bilirubin Urine: NEGATIVE
Glucose, UA: NEGATIVE mg/dL
Ketones, ur: NEGATIVE mg/dL
Leukocytes,Ua: NEGATIVE
Nitrite: NEGATIVE
Protein, ur: 100 mg/dL — AB
Specific Gravity, Urine: 1.019 (ref 1.005–1.030)
pH: 6 (ref 5.0–8.0)

## 2021-02-23 LAB — CBC
HCT: 32.9 % — ABNORMAL LOW (ref 36.0–46.0)
Hemoglobin: 11.3 g/dL — ABNORMAL LOW (ref 12.0–15.0)
MCH: 33.1 pg (ref 26.0–34.0)
MCHC: 34.3 g/dL (ref 30.0–36.0)
MCV: 96.5 fL (ref 80.0–100.0)
Platelets: 93 10*3/uL — ABNORMAL LOW (ref 150–400)
RBC: 3.41 MIL/uL — ABNORMAL LOW (ref 3.87–5.11)
RDW: 14.4 % (ref 11.5–15.5)
WBC: 3.6 10*3/uL — ABNORMAL LOW (ref 4.0–10.5)
nRBC: 0 % (ref 0.0–0.2)

## 2021-02-23 LAB — COMPREHENSIVE METABOLIC PANEL
ALT: 92 U/L — ABNORMAL HIGH (ref 0–44)
AST: 159 U/L — ABNORMAL HIGH (ref 15–41)
Albumin: 2.7 g/dL — ABNORMAL LOW (ref 3.5–5.0)
Alkaline Phosphatase: 124 U/L (ref 38–126)
Anion gap: 3 — ABNORMAL LOW (ref 5–15)
BUN: 17 mg/dL (ref 6–20)
CO2: 26 mmol/L (ref 22–32)
Calcium: 10.1 mg/dL (ref 8.9–10.3)
Chloride: 109 mmol/L (ref 98–111)
Creatinine, Ser: 0.8 mg/dL (ref 0.44–1.00)
GFR, Estimated: >60 mL/min (ref >60)
Glucose, Bld: 127 mg/dL — ABNORMAL HIGH (ref 70–99)
Potassium: 3.7 mmol/L (ref 3.5–5.1)
Sodium: 138 mmol/L (ref 135–145)
Total Bilirubin: 1.8 mg/dL — ABNORMAL HIGH (ref 0.3–1.2)
Total Protein: 7 g/dL (ref 6.5–8.1)

## 2021-02-23 LAB — LIPASE, BLOOD: Lipase: 42 U/L (ref 11–51)

## 2021-02-23 MED ORDER — SODIUM CHLORIDE 0.9 % IV BOLUS
500.0000 mL | Freq: Once | INTRAVENOUS | Status: AC
  Administered 2021-02-23: 500 mL via INTRAVENOUS

## 2021-02-23 NOTE — ED Provider Notes (Signed)
[facility] EMERGENCY DEPARTMENT Provider Note   CSN: [id number] Arrival date & time: 02/23/21  1448     History Chief Complaint  Patient presents with  . Hernia    Samantha Evans is a 60 y.o. female.  60 year old female presents with complaint of painful umbilical hernia with vomiting and diarrhea today.  Patient states that she has had this umbilical hernia for some time, its been painful for the past few days and then today she woke with one episode of nonbloody loose stools and one episode of emesis.  Feels like she has been feverish recently with chills and sweats, has not checked her temperature.  Denies urinary symptoms, sick contacts.  Prior cholecystectomy.  No other complaints or concerns.        History reviewed. No pertinent past medical history.  There are no problems to display for this patient.   History reviewed. No pertinent surgical history.   OB History   No obstetric history on file.     Family History  Problem Relation Age of Onset  . Cancer Father     Social History   Tobacco Use  . Smoking status: Current Every Day Smoker  . Smokeless tobacco: Never Used  Substance Use Topics  . Alcohol use: Not Currently    Home Medications Prior to Admission medications   Medication Sig Start Date End Date Taking? Authorizing Provider  naproxen (NAPROSYN) 500 MG tablet Take 1 tablet (500 mg total) by mouth 2 (two) times daily. 08/31/20   Zigmund Gottron, NP    Allergies    Patient has no allergy information on record.  Review of Systems   Review of Systems  Constitutional: Positive for chills and diaphoresis.  Respiratory: Negative for shortness of breath.   Cardiovascular: Negative for chest pain.  Gastrointestinal: Positive for abdominal pain, diarrhea, nausea and vomiting. Negative for blood in stool and constipation.  Genitourinary: Negative for difficulty urinating and dysuria.  Musculoskeletal: Negative for arthralgias  and myalgias.  Skin: Negative for rash and wound.  Allergic/Immunologic: Negative for immunocompromised state.  Neurological: Negative for weakness.  Psychiatric/Behavioral: Negative for confusion.  All other systems reviewed and are negative.   Physical Exam Updated Vital Signs BP (!) 148/72 (BP Location: Right Arm)   Pulse 70   Temp 98.1 F (36.7 C) (Oral)   Resp 18   SpO2 100%   Physical Exam Vitals and nursing note reviewed.  Constitutional:      General: She is not in acute distress.    Appearance: She is well-developed. She is not diaphoretic.  HENT:     Head: Normocephalic and atraumatic.  Cardiovascular:     Rate and Rhythm: Normal rate and regular rhythm.     Pulses: Normal pulses.     Heart sounds: Normal heart sounds.  Pulmonary:     Effort: Pulmonary effort is normal.     Breath sounds: Normal breath sounds.  Abdominal:     General: Bowel sounds are normal.     Palpations: Abdomen is soft.     Tenderness: There is generalized abdominal tenderness.     Hernia: A hernia is present. Hernia is present in the umbilical area.  Musculoskeletal:     Right lower leg: No edema.     Left lower leg: No edema.  Skin:    General: Skin is warm and dry.     Findings: No erythema or rash.  Neurological:     Mental Status: She is alert and  oriented to person, place, and time.  Psychiatric:        Behavior: Behavior normal.     ED Results / Procedures / Treatments   Labs (all labs ordered are listed, but only abnormal results are displayed) Labs Reviewed  COMPREHENSIVE METABOLIC PANEL - Abnormal; Notable for the following components:      Result Value   Glucose, Bld 127 (*)    Albumin 2.7 (*)    AST 159 (*)    ALT 92 (*)    Total Bilirubin 1.8 (*)    Anion gap 3 (*)    All other components within normal limits  CBC - Abnormal; Notable for the following components:   WBC 3.6 (*)    RBC 3.41 (*)    Hemoglobin 11.3 (*)    HCT 32.9 (*)    Platelets 93 (*)     All other components within normal limits  URINALYSIS, ROUTINE W REFLEX MICROSCOPIC - Abnormal; Notable for the following components:   Color, Urine AMBER (*)    APPearance HAZY (*)    Hgb urine dipstick MODERATE (*)    Protein, ur 100 (*)    Bacteria, UA RARE (*)    All other components within normal limits  LIPASE, BLOOD    EKG None  Radiology No results found.  Procedures Hernia reduction  Date/Time: 02/23/2021 10:06 AM Performed by: Tacy Learn, PA-C Authorized by: Tacy Learn, PA-C  Consent: Verbal consent obtained. Consent given by: patient Patient identity confirmed: verbally with patient Local anesthesia used: no  Anesthesia: Local anesthesia used: no  Sedation: Patient sedated: no  Patient tolerance: patient tolerated the procedure well with no immediate complications      Medications Ordered in ED Medications  sodium chloride 0.9 % bolus 500 mL (0 mLs Intravenous Stopped 02/23/21 1134)    ED Course  I have reviewed the triage vital signs and the nursing notes.  Pertinent labs & imaging results that were available during my care of the patient were reviewed by me and considered in my medical decision making (see chart for details).  Clinical Course as of 02/23/21 1139  Sun Feb 23, 3833  [id number] 60 year old female with complaint of painful umbilical hernia with one episode of vomiting, 1 episode of loose stools today. On exam, patient appears uncomfortable, has a tender umbilical hernia. Gentle pressure was applied to her hernia which was then reduced and symptoms have resolved.  Plan is to obtain labs and monitor.  Patient will notify staff if symptoms return.  If she remains symptom-free, will avoid CT imaging at this time however if pain returns, may reconsider imaging. [LM]  1101 Labs reviewed, no recent priors for comparison. CBC with white blood cell count of 3.6, hemoglobin 11.3 with hematocrit 32.9, platelets 93. CMP with elevated LFTs with  AST of 159, ALT of 92 and total bilirubin of 1.8.  Patient has prior cholecystectomy, does not have any right upper quadrant tenderness, denies alcohol use, no history of fatty liver. Lipase within normal limits.  Hemoglobin on UA with protein. On recheck, abdomen remains soft, nontender, patient remains symptom-free and feeling well. [LM]  1138 Patient does not currently have a PCP, is struggling to figure out Medicaid options and plans to go to social services this week.  Patient is referred to [facility] for follow-up.  Discussed management of her umbilical hernia at home and advised return to the ED for any return or worsening symptoms. Discussed her lab findings,  given dietary recommendations for suspected fatty liver disease. [LM]    Clinical Course User Index [LM] Roque Lias   MDM Rules/Calculators/A&P                          Final Clinical Impression(s) / ED Diagnoses Final diagnoses:  Umbilical hernia without obstruction and without gangrene  Elevated LFTs    Rx / DC Orders ED Discharge Orders    None       Tacy Learn, PA-C 02/23/21 [place], [place], DO 02/23/21 1347

## 2021-02-23 NOTE — ED Triage Notes (Signed)
Pt reports hernia pain x 3 days.  Reports diarrhea this morning.

## 2021-02-23 NOTE — Discharge Instructions (Addendum)
Follow-up with [facility] for further care. If your hernia becomes painful again, lie on your back and put an ice pack on it.  If you are unable to get the hernia to reduce (go away), return to the emergency room if it is painful and you feel unwell. Please see discharge instructions on fatty liver disease and dietary changes.

## 2021-02-23 NOTE — ED Notes (Signed)
ED Provider at bedside. 

## 2021-02-24 ENCOUNTER — Telehealth: Payer: Self-pay | Admitting: *Deleted

## 2021-02-24 NOTE — Telephone Encounter (Signed)
Transition Care Management Follow-up Telephone Call  Date of discharge and from where: 02/23/2021 - [facility]  How have you been since you were released from the hospital? "The same"  Any questions or concerns? No  Items Reviewed:  Did the pt receive and understand the discharge instructions provided? Yes   Medications obtained and verified? Yes   Other? No   Any new allergies since your discharge? No   Dietary orders reviewed? No  Do you have support at home? No    Functional Questionnaire: (I = Independent and D = Dependent) ADLs: I  Bathing/Dressing- I  Meal Prep- I  Eating- I  Maintaining continence- I  Transferring/Ambulation- I  Managing Meds- I  Follow up appointments reviewed:   PCP Hospital f/u appt confirmed? Yes  Scheduled to see Dr. Margarita Rana with [facility] on 04/02/2021 @ 0930 for a new patient appointment.  [facility] f/u appt confirmed? No    Are transportation arrangements needed? No   If their condition worsens, is the pt aware to call PCP or go to the Emergency Dept.? Yes  Was the patient provided with contact information for the PCP's office or ED? Yes  Was to pt encouraged to call back with questions or concerns? Yes

## 2021-02-27 ENCOUNTER — Emergency Department (HOSPITAL_COMMUNITY): Payer: Medicaid Other | Admitting: Certified Registered Nurse Anesthetist

## 2021-02-27 ENCOUNTER — Inpatient Hospital Stay (HOSPITAL_COMMUNITY)
Admission: EM | Admit: 2021-02-27 | Discharge: 2021-03-03 | DRG: 354 | Disposition: A | Discharge status: Home / Self Care | Payer: Medicaid Other | Attending: Internal Medicine | Admitting: Internal Medicine

## 2021-02-27 ENCOUNTER — Encounter (HOSPITAL_COMMUNITY)
Admission: EM | Disposition: A | Discharge status: Home / Self Care | Payer: Self-pay | Source: Home / Self Care | Attending: Internal Medicine

## 2021-02-27 ENCOUNTER — Emergency Department (HOSPITAL_COMMUNITY): Payer: Medicaid Other

## 2021-02-27 ENCOUNTER — Encounter (HOSPITAL_COMMUNITY): Payer: Self-pay | Admitting: Surgery

## 2021-02-27 DIAGNOSIS — Z20822 Contact with and (suspected) exposure to covid-19: Secondary | ICD-10-CM | POA: Diagnosis present

## 2021-02-27 DIAGNOSIS — I10 Essential (primary) hypertension: Secondary | ICD-10-CM | POA: Diagnosis present

## 2021-02-27 DIAGNOSIS — D696 Thrombocytopenia, unspecified: Secondary | ICD-10-CM | POA: Diagnosis present

## 2021-02-27 DIAGNOSIS — K56609 Unspecified intestinal obstruction, unspecified as to partial versus complete obstruction: Secondary | ICD-10-CM | POA: Diagnosis not present

## 2021-02-27 DIAGNOSIS — K460 Unspecified abdominal hernia with obstruction, without gangrene: Secondary | ICD-10-CM | POA: Diagnosis not present

## 2021-02-27 DIAGNOSIS — Z6833 Body mass index (BMI) 33.0-33.9, adult: Secondary | ICD-10-CM

## 2021-02-27 DIAGNOSIS — R1033 Periumbilical pain: Secondary | ICD-10-CM

## 2021-02-27 DIAGNOSIS — F17210 Nicotine dependence, cigarettes, uncomplicated: Secondary | ICD-10-CM | POA: Diagnosis not present

## 2021-02-27 DIAGNOSIS — Z0189 Encounter for other specified special examinations: Secondary | ICD-10-CM

## 2021-02-27 DIAGNOSIS — Z9049 Acquired absence of other specified parts of digestive tract: Secondary | ICD-10-CM | POA: Diagnosis not present

## 2021-02-27 DIAGNOSIS — N179 Acute kidney failure, unspecified: Secondary | ICD-10-CM

## 2021-02-27 DIAGNOSIS — K7469 Other cirrhosis of liver: Secondary | ICD-10-CM

## 2021-02-27 DIAGNOSIS — K7460 Unspecified cirrhosis of liver: Secondary | ICD-10-CM

## 2021-02-27 DIAGNOSIS — K420 Umbilical hernia with obstruction, without gangrene: Secondary | ICD-10-CM | POA: Diagnosis not present

## 2021-02-27 DIAGNOSIS — E669 Obesity, unspecified: Secondary | ICD-10-CM | POA: Diagnosis not present

## 2021-02-27 DIAGNOSIS — R109 Unspecified abdominal pain: Secondary | ICD-10-CM

## 2021-02-27 HISTORY — DX: Carpal tunnel syndrome, unspecified upper limb: G56.00

## 2021-02-27 HISTORY — DX: Unspecified cataract: H26.9

## 2021-02-27 HISTORY — DX: Unspecified osteoarthritis, unspecified site: M19.90

## 2021-02-27 HISTORY — DX: Unspecified intestinal obstruction, unspecified as to partial versus complete obstruction: K56.609

## 2021-02-27 HISTORY — PX: UMBILICAL HERNIA REPAIR: SHX196

## 2021-02-27 LAB — CBC WITH DIFFERENTIAL/PLATELET
Abs Immature Granulocytes: 0.01 10*3/uL (ref 0.00–0.07)
Basophils Absolute: 0 10*3/uL (ref 0.0–0.1)
Basophils Relative: 1 %
Eosinophils Absolute: 0.1 10*3/uL (ref 0.0–0.5)
Eosinophils Relative: 1 %
HCT: 36.5 % (ref 36.0–46.0)
Hemoglobin: 12.6 g/dL (ref 12.0–15.0)
Immature Granulocytes: 0 %
Lymphocytes Relative: 18 %
Lymphs Abs: 1.1 10*3/uL (ref 0.7–4.0)
MCH: 33.2 pg (ref 26.0–34.0)
MCHC: 34.5 g/dL (ref 30.0–36.0)
MCV: 96.1 fL (ref 80.0–100.0)
Monocytes Absolute: 0.5 10*3/uL (ref 0.1–1.0)
Monocytes Relative: 7 %
Neutro Abs: 4.5 10*3/uL (ref 1.7–7.7)
Neutrophils Relative %: 73 %
Platelets: 135 10*3/uL — ABNORMAL LOW (ref 150–400)
RBC: 3.8 MIL/uL — ABNORMAL LOW (ref 3.87–5.11)
RDW: 15.2 % (ref 11.5–15.5)
WBC: 6.2 10*3/uL (ref 4.0–10.5)
nRBC: 0 % (ref 0.0–0.2)

## 2021-02-27 LAB — PROTIME-INR
INR: 1.3 — ABNORMAL HIGH (ref 0.8–1.2)
Prothrombin Time: 16 seconds — ABNORMAL HIGH (ref 11.4–15.2)

## 2021-02-27 LAB — LACTIC ACID, PLASMA
Lactic Acid, Venous: 1.8 mmol/L (ref 0.5–1.9)
Lactic Acid, Venous: 1.4 mmol/L (ref 0.5–1.9)

## 2021-02-27 LAB — TYPE AND SCREEN
ABO/RH(D): A POS
Antibody Screen: NEGATIVE

## 2021-02-27 LAB — COMPREHENSIVE METABOLIC PANEL
ALT: 72 U/L — ABNORMAL HIGH (ref 0–44)
AST: 105 U/L — ABNORMAL HIGH (ref 15–41)
Albumin: 3 g/dL — ABNORMAL LOW (ref 3.5–5.0)
Alkaline Phosphatase: 94 U/L (ref 38–126)
Anion gap: 8 (ref 5–15)
BUN: 25 mg/dL — ABNORMAL HIGH (ref 6–20)
CO2: 22 mmol/L (ref 22–32)
Calcium: 10.4 mg/dL — ABNORMAL HIGH (ref 8.9–10.3)
Chloride: 106 mmol/L (ref 98–111)
Creatinine, Ser: 1.18 mg/dL — ABNORMAL HIGH (ref 0.44–1.00)
GFR, Estimated: 53 mL/min — ABNORMAL LOW (ref >60)
Glucose, Bld: 107 mg/dL — ABNORMAL HIGH (ref 70–99)
Potassium: 4.1 mmol/L (ref 3.5–5.1)
Sodium: 136 mmol/L (ref 135–145)
Total Bilirubin: 2.8 mg/dL — ABNORMAL HIGH (ref 0.3–1.2)
Total Protein: 7.3 g/dL (ref 6.5–8.1)

## 2021-02-27 LAB — ABO/RH: ABO/RH(D): A POS

## 2021-02-27 LAB — RESP PANEL BY RT-PCR (FLU A&B, COVID) ARPGX2
Influenza A by PCR: NEGATIVE
Influenza B by PCR: NEGATIVE
SARS Coronavirus 2 by RT PCR: NEGATIVE

## 2021-02-27 LAB — LIPASE, BLOOD: Lipase: 37 U/L (ref 11–51)

## 2021-02-27 IMAGING — CT CT ABD-PELV W/ CM
2 of 5 series · 15 of 46 positions shown, 17 images · IV contrast (APPLIED)
Comparison: CT abdomen pelvis [DATE]

CLINICAL DATA: Abdominal pain.  Incarcerated hernia suspected.

EXAM:
CT ABDOMEN AND PELVIS WITH CONTRAST
TECHNIQUE: Multidetector CT imaging of the abdomen and pelvis was performed
using the standard protocol following bolus administration of
intravenous contrast.
CONTRAST:  100mL OMNIPAQUE IOHEXOL 300 MG/ML  SOLN

[Series 3: abd/ pelvis 5.0 i30f 2 · axial · 0.91mm/px · z∈[+1034,+1454]mm · 12 of 94 slices shown, 14 images]
[im 5/94  soft-tissue]
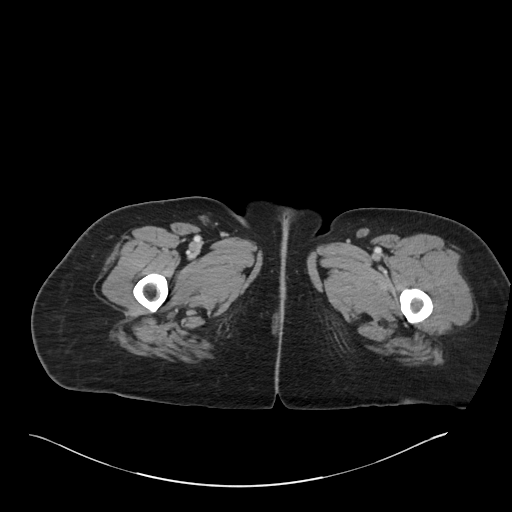
[im 5/94  bone]
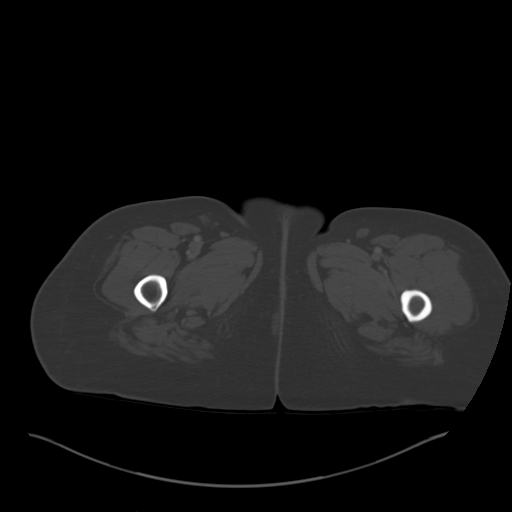
[im 15/94  soft-tissue]
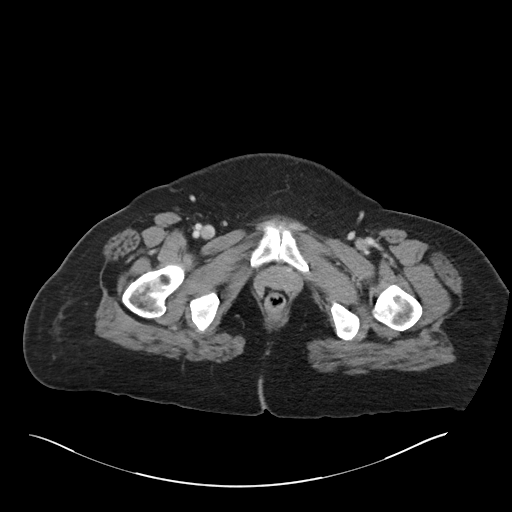
[im 20/94  soft-tissue]
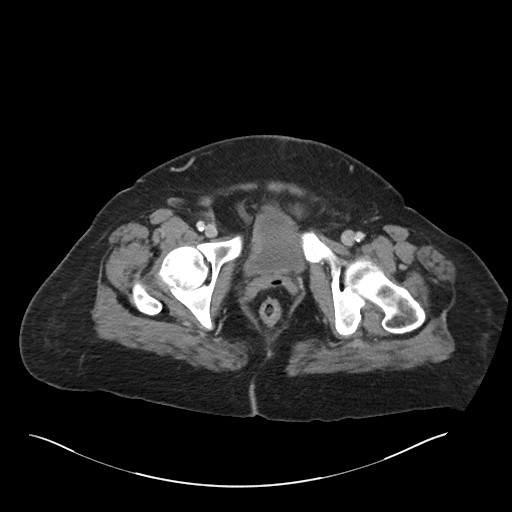
[im 30/94  soft-tissue]
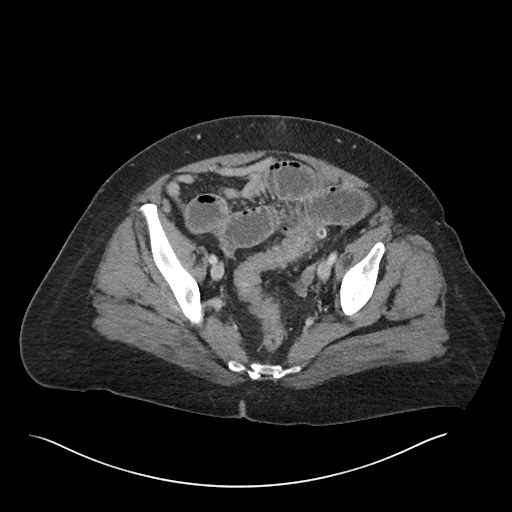
[im 35/94  soft-tissue]
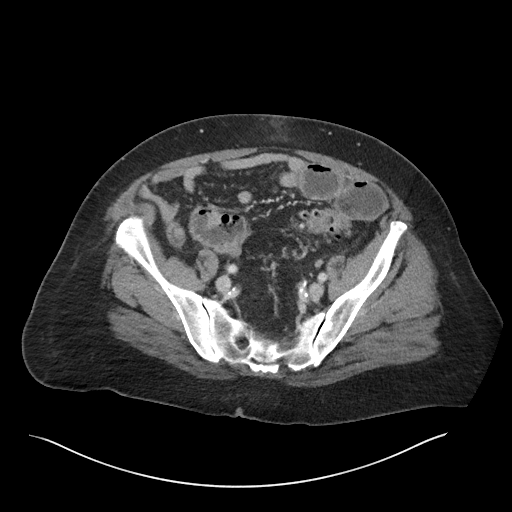
[im 45/94  soft-tissue]
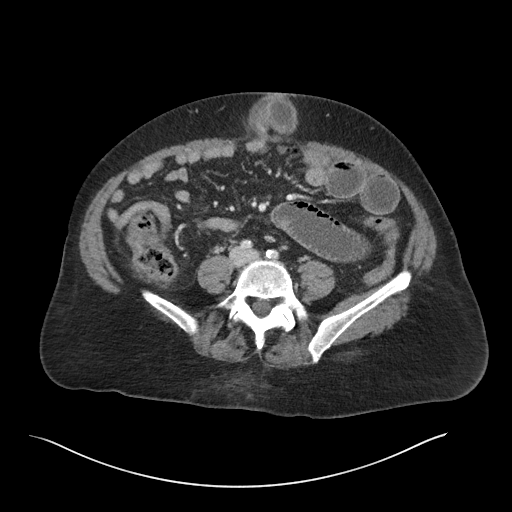
[im 49/94  soft-tissue]
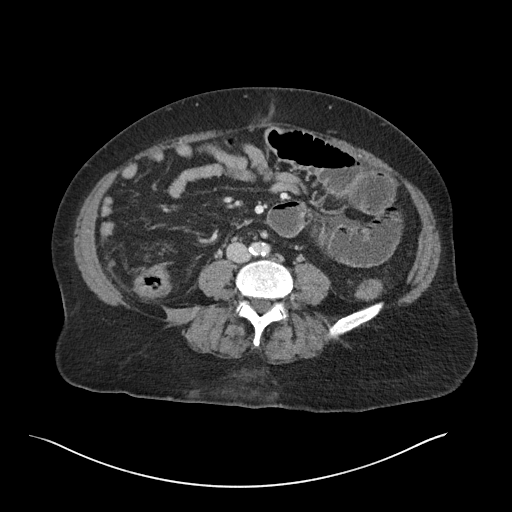
[im 59/94  soft-tissue]
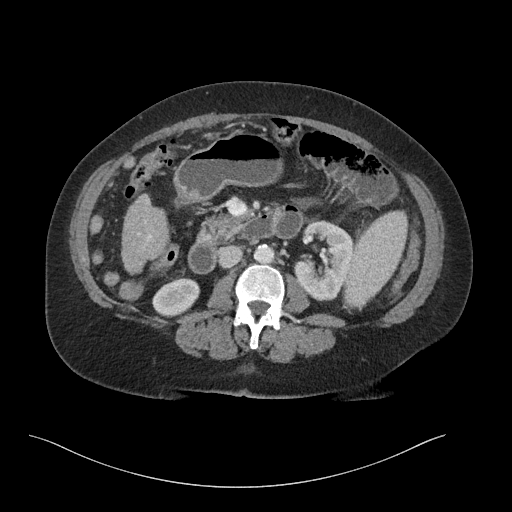
[im 64/94  soft-tissue]
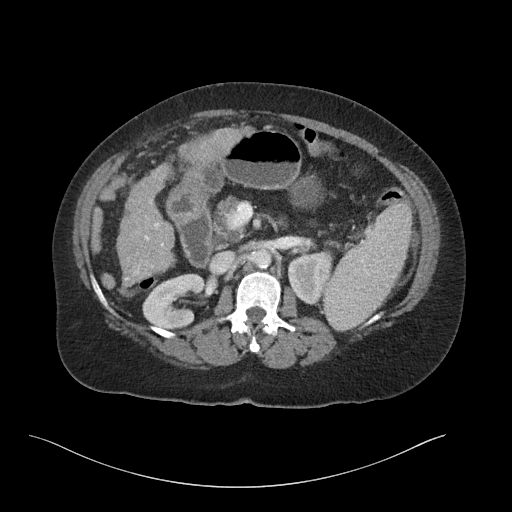
[im 64/94  bone]
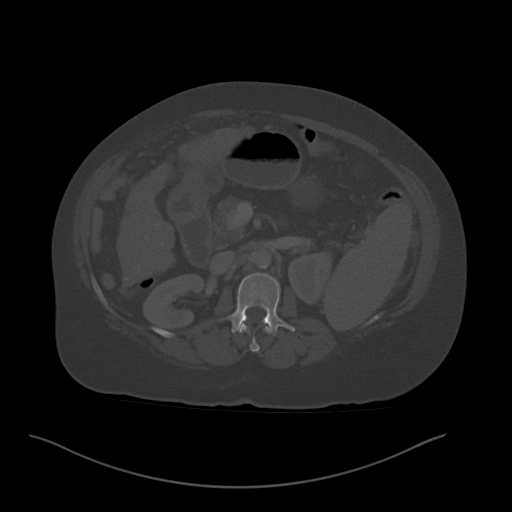
[im 74/94  soft-tissue]
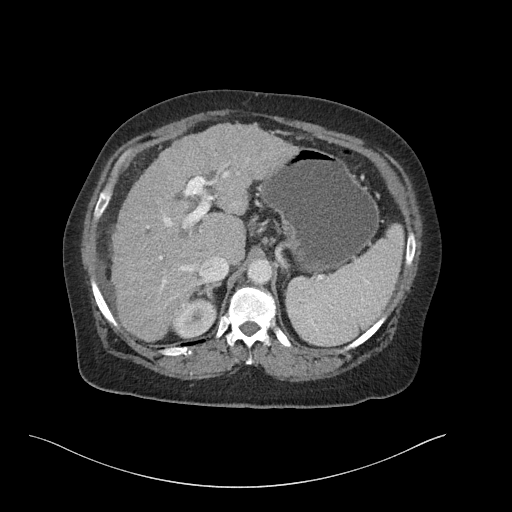
[im 79/94  soft-tissue]
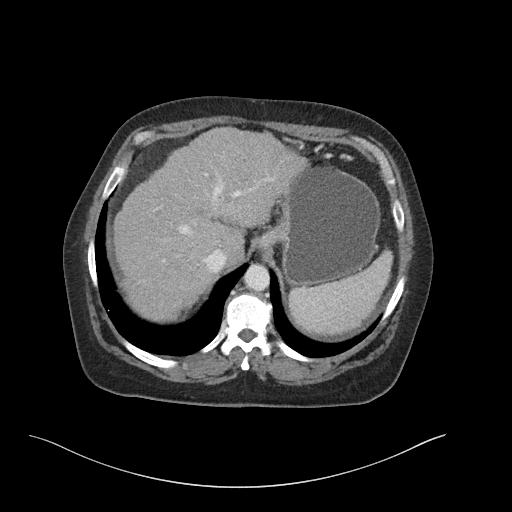
[im 89/94  soft-tissue]
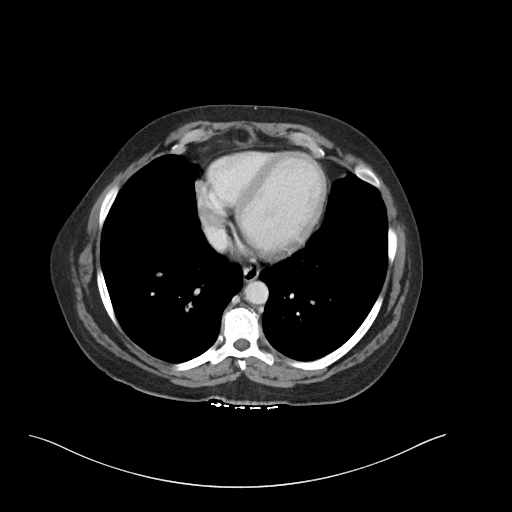

[Series 6: coronal soft tissue · coronal · 0.87mm/px · 3 of 107 slices shown]
[im 36/107  soft-tissue]
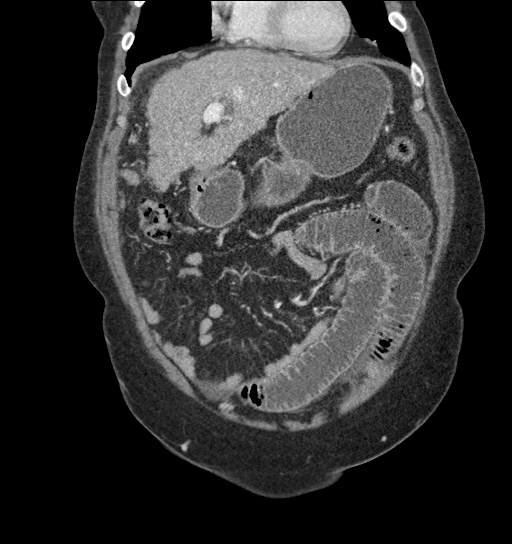
[im 48/107  soft-tissue]
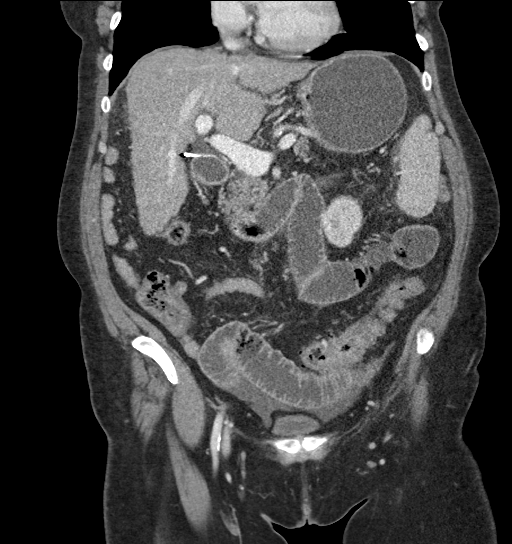
[im 59/107  soft-tissue]
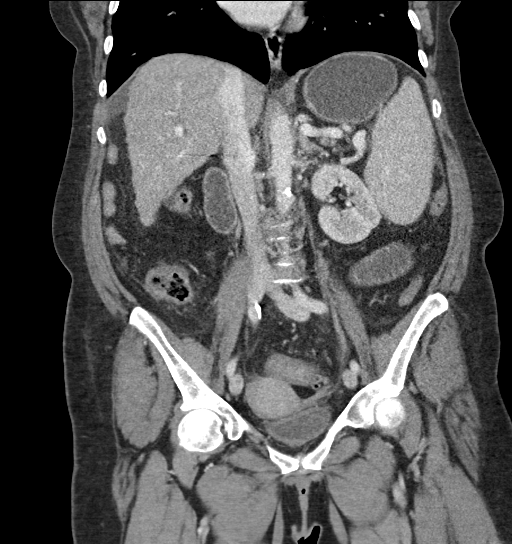

[15 of 46 positions shown; findings below may reference images not displayed]

FINDINGS: Lower chest: No acute abnormality.

Hepatobiliary: Nodular hepatic contour with an enlarged caudate
lobe. Several hypodense lesions within the liver are smaller than 1
cm in too small to characterize. There is a fluid density lesion
within the right hepatic lobe measuring up to 2.1 cm ([DATE]). Status
post cholecystectomy. No biliary dilatation.

Pancreas: No focal lesion. Normal pancreatic contour. No surrounding
inflammatory changes. No main pancreatic ductal dilatation.

Spleen: The spleen is enlarged measuring up to 14 cm. No focal
abnormality.

Adrenals/Urinary Tract:

No adrenal nodule bilaterally.

Bilateral kidneys enhance symmetrically. Subcentimeter hypodensities
are too small to characterize. No hydronephrosis. No hydroureter.

The urinary bladder is unremarkable.

Stomach/Bowel: Proximal to mid small bowel is dilated with fluid.
There is a transition point at the entrance of the small bowel into
an umbilical hernia ([DATE]). A short segment of small bowel is noted
to be dilated with fluid within the umbilical hernia with findings
suggestive of a closed loop obstruction at the entry and exit sites.
Associated bowel wall thickening of this short segment of bowel is
noted. No definite pneumatosis. Question trace free fluid
surrounding this bowel loop. The remainder of the distal small bowel
is completely collapsed. Small amount of stool is noted within the
colon. Scattered sigmoid diverticulosis. No large bowel dilatation
or bowel wall thickening. The appendix is not definitely identified.

Vascular/Lymphatic: Trace venous collateral are noted within the
upper abdomen. The portal, splenic, superior mesenteric veins are
patent. No abdominal aorta or iliac aneurysm. At least moderate
atherosclerotic plaque of the aorta and its branches. No abdominal,
pelvic, or inguinal lymphadenopathy.

Reproductive: Uterus and bilateral adnexa are unremarkable.

Other: Trace simple free fluid. No intraperitoneal free gas. No
organized fluid collection.

Musculoskeletal:

Periumbilical hernia with a short loop of small bowel noted and
described above. Superiorly there is likely a tiny fat containing
ventral wall hernia with an abdominal defect that is difficult to
measure considering it's small size ([DATE], [DATE]). Inferiorly there
is another tiny fat and fluid containing ventral wall hernia with an
abdominal defect of 2 mm ([DATE], [DATE]).

No suspicious lytic or blastic osseous lesions. No acute displaced
fracture. Multilevel degenerative changes of the spine.
IMPRESSION: 1. Small-bowel obstruction with transition point at the entry site
of an umbilical hernia. Associated closed loop obstruction of a
short segment of small bowel within the umbilical hernia. Concern
for ischemia and incarceration of the bowel loop within the hernia.
Recommend emergent surgical consultation.
2. Cirrhosis with portal hypertension. A 2.1cm indetemriante fluid
density lesion with otherwise markedly limited evaluation for focal
hepatic lesion on this single phase portal venous study. Recommend
MRI liver protocol for further evaluation.
3. Couple of other tiny ventral wall hernia containing fat and free
fluid.
4. Scattered sigmoid diverticulosis with no acute diverticulitis.
5. Trace ascites.

These results were called by telephone at the time of interpretation
on [DATE] at [DATE] to provider Dr. MARCHELLINO, who verbally
acknowledged these results.

## 2021-02-27 SURGERY — REPAIR, HERNIA, UMBILICAL, ADULT
Anesthesia: General | Site: Abdomen

## 2021-02-27 MED ORDER — ACETAMINOPHEN 10 MG/ML IV SOLN: 1000.0000 mg | Freq: Once | INTRAVENOUS | Status: DC | PRN

## 2021-02-27 MED ORDER — SUCCINYLCHOLINE CHLORIDE 200 MG/10ML IV SOSY
PREFILLED_SYRINGE | INTRAVENOUS | Status: DC | PRN
  Administered 2021-02-27: 100 mg via INTRAVENOUS

## 2021-02-27 MED ORDER — ACETAMINOPHEN 160 MG/5ML PO SOLN: 325.0000 mg | Freq: Once | ORAL | Status: DC | PRN

## 2021-02-27 MED ORDER — HYDROMORPHONE HCL 1 MG/ML IJ SOLN
0.5000 mg | INTRAMUSCULAR | Status: DC | PRN
  Administered 2021-02-27: 0.5 mg via INTRAVENOUS

## 2021-02-27 MED ORDER — CHLORHEXIDINE GLUCONATE 0.12 % MT SOLN
OROMUCOSAL | Status: AC
  Administered 2021-02-27: 15 mL via OROMUCOSAL
  Filled 2021-02-27: qty 15

## 2021-02-27 MED ORDER — LACTATED RINGERS IV SOLN: INTRAVENOUS | Status: DC

## 2021-02-27 MED ORDER — ESMOLOL HCL 100 MG/10ML IV SOLN
INTRAVENOUS | Status: AC
  Filled 2021-02-27: qty 10

## 2021-02-27 MED ORDER — HYDROMORPHONE HCL 1 MG/ML IJ SOLN
INTRAMUSCULAR | Status: AC
  Administered 2021-02-27: 0.5 mg via INTRAVENOUS
  Filled 2021-02-27: qty 1

## 2021-02-27 MED ORDER — PROPOFOL 10 MG/ML IV BOLUS
INTRAVENOUS | Status: DC | PRN
  Administered 2021-02-27: 90 mg via INTRAVENOUS

## 2021-02-27 MED ORDER — FENTANYL CITRATE (PF) 250 MCG/5ML IJ SOLN
INTRAMUSCULAR | Status: AC
  Filled 2021-02-27: qty 5

## 2021-02-27 MED ORDER — BUPIVACAINE-EPINEPHRINE 0.5% -1:200000 IJ SOLN
INTRAMUSCULAR | Status: DC | PRN
  Administered 2021-02-27: 20 mL

## 2021-02-27 MED ORDER — LABETALOL HCL 5 MG/ML IV SOLN
INTRAVENOUS | Status: AC
  Filled 2021-02-27: qty 4

## 2021-02-27 MED ORDER — SODIUM CHLORIDE 0.9 % IV SOLN
2.0000 g | Freq: Once | INTRAVENOUS | Status: AC
  Administered 2021-02-27: 2 g via INTRAVENOUS
  Filled 2021-02-27 (×2): qty 2

## 2021-02-27 MED ORDER — ACETAMINOPHEN 325 MG PO TABS: 325.0000 mg | ORAL_TABLET | Freq: Once | ORAL | Status: DC | PRN

## 2021-02-27 MED ORDER — LABETALOL HCL 5 MG/ML IV SOLN
INTRAVENOUS | Status: DC | PRN
  Administered 2021-02-27: 5 mg via INTRAVENOUS
  Administered 2021-02-27: 2.5 mg via INTRAVENOUS
  Administered 2021-02-27: 5 mg via INTRAVENOUS
  Administered 2021-02-27: 2.5 mg via INTRAVENOUS

## 2021-02-27 MED ORDER — HYDROMORPHONE HCL 1 MG/ML IJ SOLN
0.2500 mg | INTRAMUSCULAR | Status: DC | PRN
  Administered 2021-02-27: 0.5 mg via INTRAVENOUS

## 2021-02-27 MED ORDER — MIDAZOLAM HCL 5 MG/5ML IJ SOLN
INTRAMUSCULAR | Status: DC | PRN
  Administered 2021-02-27: 2 mg via INTRAVENOUS

## 2021-02-27 MED ORDER — ORAL CARE MOUTH RINSE: 15.0000 mL | Freq: Once | OROMUCOSAL | Status: AC

## 2021-02-27 MED ORDER — MEPERIDINE HCL 25 MG/ML IJ SOLN: 6.2500 mg | INTRAMUSCULAR | Status: DC | PRN

## 2021-02-27 MED ORDER — ESMOLOL HCL 100 MG/10ML IV SOLN
INTRAVENOUS | Status: DC | PRN
  Administered 2021-02-27: 30 mg via INTRAVENOUS

## 2021-02-27 MED ORDER — FENTANYL CITRATE (PF) 250 MCG/5ML IJ SOLN
INTRAMUSCULAR | Status: DC | PRN
  Administered 2021-02-27 (×2): 50 ug via INTRAVENOUS
  Administered 2021-02-27: 100 ug via INTRAVENOUS
  Administered 2021-02-27: 50 ug via INTRAVENOUS

## 2021-02-27 MED ORDER — IOHEXOL 300 MG/ML  SOLN
100.0000 mL | Freq: Once | INTRAMUSCULAR | Status: AC | PRN
  Administered 2021-02-27: 100 mL via INTRAVENOUS

## 2021-02-27 MED ORDER — 0.9 % SODIUM CHLORIDE (POUR BTL) OPTIME
TOPICAL | Status: DC | PRN
  Administered 2021-02-27: 1000 mL

## 2021-02-27 MED ORDER — BUPIVACAINE-EPINEPHRINE (PF) 0.25% -1:200000 IJ SOLN
INTRAMUSCULAR | Status: AC
  Filled 2021-02-27: qty 30

## 2021-02-27 MED ORDER — SUGAMMADEX SODIUM 200 MG/2ML IV SOLN
INTRAVENOUS | Status: DC | PRN
  Administered 2021-02-27: 200 mg via INTRAVENOUS

## 2021-02-27 MED ORDER — ONDANSETRON HCL 4 MG/2ML IJ SOLN
INTRAMUSCULAR | Status: DC | PRN
  Administered 2021-02-27: 4 mg via INTRAVENOUS

## 2021-02-27 MED ORDER — DEXAMETHASONE SODIUM PHOSPHATE 10 MG/ML IJ SOLN
INTRAMUSCULAR | Status: DC | PRN
  Administered 2021-02-27: 10 mg via INTRAVENOUS

## 2021-02-27 MED ORDER — MIDAZOLAM HCL 2 MG/2ML IJ SOLN
INTRAMUSCULAR | Status: AC
  Filled 2021-02-27: qty 2

## 2021-02-27 MED ORDER — SODIUM CHLORIDE 0.9 % IV BOLUS
500.0000 mL | Freq: Once | INTRAVENOUS | Status: AC
  Administered 2021-02-27: 500 mL via INTRAVENOUS

## 2021-02-27 MED ORDER — LIDOCAINE 2% (20 MG/ML) 5 ML SYRINGE
INTRAMUSCULAR | Status: DC | PRN
  Administered 2021-02-27: 40 mg via INTRAVENOUS

## 2021-02-27 MED ORDER — CHLORHEXIDINE GLUCONATE 0.12 % MT SOLN: 15.0000 mL | Freq: Once | OROMUCOSAL | Status: AC

## 2021-02-27 MED ORDER — LACTATED RINGERS IV SOLN: INTRAVENOUS | Status: DC | PRN

## 2021-02-27 MED ORDER — DEXTROSE-NACL 5-0.45 % IV SOLN: INTRAVENOUS | Status: AC

## 2021-02-27 MED ORDER — FENTANYL CITRATE (PF) 100 MCG/2ML IJ SOLN: 12.5000 ug | INTRAMUSCULAR | Status: DC | PRN

## 2021-02-27 MED ORDER — PROPOFOL 10 MG/ML IV BOLUS
INTRAVENOUS | Status: AC
  Filled 2021-02-27: qty 20

## 2021-02-27 MED ORDER — DIPHENHYDRAMINE HCL 50 MG/ML IJ SOLN
INTRAMUSCULAR | Status: DC | PRN
  Administered 2021-02-27: 6.25 mg via INTRAVENOUS

## 2021-02-27 MED ORDER — ONDANSETRON HCL 4 MG PO TABS: 4.0000 mg | ORAL_TABLET | Freq: Four times a day (QID) | ORAL | Status: DC | PRN

## 2021-02-27 MED ORDER — CHLORHEXIDINE GLUCONATE 0.12 % MT SOLN: 15.0000 mL | OROMUCOSAL | Status: AC

## 2021-02-27 MED ORDER — ONDANSETRON HCL 4 MG/2ML IJ SOLN: 4.0000 mg | Freq: Four times a day (QID) | INTRAMUSCULAR | Status: DC | PRN

## 2021-02-27 MED ORDER — ROCURONIUM BROMIDE 10 MG/ML (PF) SYRINGE
PREFILLED_SYRINGE | INTRAVENOUS | Status: DC | PRN
  Administered 2021-02-27: 60 mg via INTRAVENOUS

## 2021-02-27 SURGICAL SUPPLY — 43 items
BLADE CLIPPER SURG (BLADE) IMPLANT
CANISTER SUCT 3000ML PPV (MISCELLANEOUS) ×2 IMPLANT
CHLORAPREP W/TINT 26 (MISCELLANEOUS) ×2 IMPLANT
COVER SURGICAL LIGHT HANDLE (MISCELLANEOUS) ×2 IMPLANT
COVER WAND RF STERILE (DRAPES) ×2 IMPLANT
DECANTER SPIKE VIAL GLASS SM (MISCELLANEOUS) ×2 IMPLANT
DERMABOND ADHESIVE PROPEN (GAUZE/BANDAGES/DRESSINGS) ×1
DERMABOND ADVANCED (GAUZE/BANDAGES/DRESSINGS) ×1
DERMABOND ADVANCED .7 DNX12 (GAUZE/BANDAGES/DRESSINGS) ×1 IMPLANT
DERMABOND ADVANCED .7 DNX6 (GAUZE/BANDAGES/DRESSINGS) ×1 IMPLANT
DRAPE LAPAROTOMY 100X72 PEDS (DRAPES) ×2 IMPLANT
ELECT REM PT RETURN 9FT ADLT (ELECTROSURGICAL) ×2
ELECTRODE REM PT RTRN 9FT ADLT (ELECTROSURGICAL) ×1 IMPLANT
GLOVE BIO SURGEON STRL SZ8 (GLOVE) ×2 IMPLANT
GLOVE OPTIFIT SS 6.0 STRL BRWN (GLOVE) ×2 IMPLANT
GLOVE OPTIFIT SS 7.5 STRL LX (GLOVE) ×2 IMPLANT
GLOVE SRG 8 PF TXTR STRL LF DI (GLOVE) ×1 IMPLANT
GLOVE SURG ENC MOIS LTX SZ6.5 (GLOVE) ×2 IMPLANT
GLOVE SURG UNDER POLY LF SZ8 (GLOVE) ×2
GOWN STRL REUS W/ TWL LRG LVL3 (GOWN DISPOSABLE) ×1 IMPLANT
GOWN STRL REUS W/ TWL XL LVL3 (GOWN DISPOSABLE) ×1 IMPLANT
GOWN STRL REUS W/TWL LRG LVL3 (GOWN DISPOSABLE) ×2
GOWN STRL REUS W/TWL XL LVL3 (GOWN DISPOSABLE) ×2
KIT BASIN OR (CUSTOM PROCEDURE TRAY) ×2 IMPLANT
KIT TURNOVER KIT B (KITS) ×2 IMPLANT
NEEDLE 22X1 1/2 (OR ONLY) (NEEDLE) ×2 IMPLANT
NS IRRIG 1000ML POUR BTL (IV SOLUTION) ×2 IMPLANT
PACK GENERAL/GYN (CUSTOM PROCEDURE TRAY) ×2 IMPLANT
PAD ARMBOARD 7.5X6 YLW CONV (MISCELLANEOUS) ×2 IMPLANT
PENCIL SMOKE EVACUATOR (MISCELLANEOUS) ×2 IMPLANT
SUT MNCRL AB 4-0 PS2 18 (SUTURE) ×2 IMPLANT
SUT NOVA 1 T20/GS 25DT (SUTURE) ×4 IMPLANT
SUT PROLENE 0 CT 1 30 (SUTURE) ×4 IMPLANT
SUT SILK 2 0 SH CR/8 (SUTURE) ×2 IMPLANT
SUT SILK 2 0 TIES 10X30 (SUTURE) ×2 IMPLANT
SUT SILK 3 0 TIES 10X30 (SUTURE) ×2 IMPLANT
SUT VIC AB 2-0 CT1 27 (SUTURE) ×2
SUT VIC AB 2-0 CT1 TAPERPNT 27 (SUTURE) ×1 IMPLANT
SUT VIC AB 3-0 SH 27 (SUTURE) ×8
SUT VIC AB 3-0 SH 27XBRD (SUTURE) ×4 IMPLANT
SYR CONTROL 10ML LL (SYRINGE) ×2 IMPLANT
TOWEL GREEN STERILE (TOWEL DISPOSABLE) ×2 IMPLANT
TOWEL GREEN STERILE FF (TOWEL DISPOSABLE) ×2 IMPLANT

## 2021-02-27 NOTE — Anesthesia Preprocedure Evaluation (Addendum)
Anesthesia Evaluation  Patient identified by MRN, date of birth, ID band Patient awake    Reviewed: Allergy & Precautions, NPO status , Patient's Chart, lab work & pertinent test results  Airway Mallampati: I  TM Distance: <3 FB Neck ROM: Full    Dental  (+) Edentulous Upper, Edentulous Lower   Pulmonary Current Smoker,    breath sounds clear to auscultation       Cardiovascular negative cardio ROS   Rhythm:Regular Rate:Normal     Neuro/Psych negative neurological ROS  negative psych ROS   GI/Hepatic negative GI ROS, (+) Cirrhosis       ,   Endo/Other    Renal/GU Renal InsufficiencyRenal disease     Musculoskeletal   Abdominal Normal abdominal exam  (+)   Peds  Hematology   Anesthesia Other Findings   Reproductive/Obstetrics                            Anesthesia Physical Anesthesia Plan  ASA: III and emergent  Anesthesia Plan: General   Post-op Pain Management:    Induction: Intravenous, Rapid sequence and Cricoid pressure planned  PONV Risk Score and Plan: 3 and Ondansetron, Dexamethasone and Midazolam  Airway Management Planned: Oral ETT  Additional Equipment: None  Intra-op Plan:   Post-operative Plan: Extubation in OR  Informed Consent: I have reviewed the patients History and Physical, chart, labs and discussed the procedure including the risks, benefits and alternatives for the proposed anesthesia with the patient or authorized representative who has indicated his/her understanding and acceptance.     Dental advisory given  Plan Discussed with: CRNA  Anesthesia Plan Comments:        Anesthesia Quick Evaluation

## 2021-02-27 NOTE — Consult Note (Signed)
Reason for Consult:incarcerated umbilical hernia Referring Physician: Kellis Topete Garron is an 60 y.o. female.  HPI: 60 year old female presents to the emergency department complaining of swelling at her umbilicus, nausea and vomiting.  She was seen in the emergency department for an umbilical hernia on 1/02.  At that time, it was reportedly reduced and she was sent home.  She developed increased swelling in the area as well as vomiting and return to the emergency department today.  She is also had some diarrhea.  She complains of localized pain.  She underwent CT scan of the abdomen and pelvis in the emergency department which demonstrates an umbilical hernia with a loop of small bowel inside the hernia causing a small bowel obstruction.  There is also signs of inflammation.  I was asked to see her for surgical management.  No past medical history on file.  No past surgical history on file.  Family History  Problem Relation Age of Onset  . Cancer Father     Social History:  reports that she has been smoking. She has never used smokeless tobacco. She reports previous alcohol use. No history on file for drug use.  Allergies: No Known Allergies  Medications: I have reviewed the patient's current medications.  Results for orders placed or performed during the hospital encounter of 02/27/21 (from the past 48 hour(s))  CBC with Differential     Status: Abnormal   Collection Time: 02/27/21  1:45 PM  Result Value Ref Range   WBC 6.2 4.0 - 10.5 K/uL   RBC 3.80 (L) 3.87 - 5.11 MIL/uL   Hemoglobin 12.6 12.0 - 15.0 g/dL   HCT 36.5 36.0 - 46.0 %   MCV 96.1 80.0 - 100.0 fL   MCH 33.2 26.0 - 34.0 pg   MCHC 34.5 30.0 - 36.0 g/dL   RDW 15.2 11.5 - 15.5 %   Platelets 135 (L) 150 - 400 K/uL    Comment: REPEATED TO VERIFY   nRBC 0.0 0.0 - 0.2 %   Neutrophils Relative % 73 %   Neutro Abs 4.5 1.7 - 7.7 K/uL   Lymphocytes Relative 18 %   Lymphs Abs 1.1 0.7 - 4.0 K/uL   Monocytes Relative 7  %   Monocytes Absolute 0.5 0.1 - 1.0 K/uL   Eosinophils Relative 1 %   Eosinophils Absolute 0.1 0.0 - 0.5 K/uL   Basophils Relative 1 %   Basophils Absolute 0.0 0.0 - 0.1 K/uL   Immature Granulocytes 0 %   Abs Immature Granulocytes 0.01 0.00 - 0.07 K/uL    Comment: Performed at [facility], [address] [place],  [place] [address]  Comprehensive metabolic panel     Status: Abnormal   Collection Time: 02/27/21  1:45 PM  Result Value Ref Range   Sodium 136 135 - 145 mmol/L   Potassium 4.1 3.5 - 5.1 mmol/L   Chloride 106 98 - 111 mmol/L   CO2 22 22 - 32 mmol/L   Glucose, Bld 107 (H) 70 - 99 mg/dL    Comment: Glucose reference range applies only to samples taken after fasting for at least 8 hours.   BUN 25 (H) 6 - 20 mg/dL   Creatinine, Ser 1.18 (H) 0.44 - 1.00 mg/dL   Calcium 10.4 (H) 8.9 - 10.3 mg/dL   Total Protein 7.3 6.5 - 8.1 g/dL   Albumin 3.0 (L) 3.5 - 5.0 g/dL   AST 105 (H) 15 - 41 U/L   ALT 72 (  H) 0 - 44 U/L   Alkaline Phosphatase 94 38 - 126 U/L   Total Bilirubin 2.8 (H) 0.3 - 1.2 mg/dL   GFR, Estimated 53 (L) >60 mL/min    Comment: (NOTE) Calculated using the CKD-EPI Creatinine Equation (2021)    Anion gap 8 5 - 15    Comment: Performed at [facility] [address] [place], [place] [address]  Lipase, blood     Status: None   Collection Time: 02/27/21  1:45 PM  Result Value Ref Range   Lipase 37 11 - 51 U/L    Comment: Performed at [facility] [address], [place], [place] [address]    CT ABDOMEN PELVIS W CONTRAST  Result Date: 02/27/2021 CLINICAL DATA:  Abdominal pain.  Incarcerated hernia suspected. EXAM: CT ABDOMEN AND PELVIS WITH CONTRAST TECHNIQUE: Multidetector CT imaging of the abdomen and pelvis was performed using the standard protocol following bolus administration of intravenous contrast. CONTRAST:  117mL OMNIPAQUE IOHEXOL 300 MG/ML  SOLN COMPARISON:  CT abdomen pelvis 02/23/2010 FINDINGS: Lower chest: No acute abnormality.  Hepatobiliary: Nodular hepatic contour with an enlarged caudate lobe. Several hypodense lesions within the liver are smaller than 1 cm in too small to characterize. There is a fluid density lesion within the right hepatic lobe measuring up to 2.1 cm (3:14). Status post cholecystectomy. No biliary dilatation. Pancreas: No focal lesion. Normal pancreatic contour. No surrounding inflammatory changes. No main pancreatic ductal dilatation. Spleen: The spleen is enlarged measuring up to 14 cm. No focal abnormality. Adrenals/Urinary Tract: No adrenal nodule bilaterally. Bilateral kidneys enhance symmetrically. Subcentimeter hypodensities are too small to characterize. No hydronephrosis. No hydroureter. The urinary bladder is unremarkable. Stomach/Bowel: Proximal to mid small bowel is dilated with fluid. There is a transition point at the entrance of the small bowel into an umbilical hernia (9:62). A short segment of small bowel is noted to be dilated with fluid within the umbilical hernia with findings suggestive of a closed loop obstruction at the entry and exit sites. Associated bowel wall thickening of this short segment of bowel is noted. No definite pneumatosis. Question trace free fluid surrounding this bowel loop. The remainder of the distal small bowel is completely collapsed. Small amount of stool is noted within the colon. Scattered sigmoid diverticulosis. No large bowel dilatation or bowel wall thickening. The appendix is not definitely identified. Vascular/Lymphatic: Trace venous collateral are noted within the upper abdomen. The portal, splenic, superior mesenteric veins are patent. No abdominal aorta or iliac aneurysm. At least moderate atherosclerotic plaque of the aorta and its branches. No abdominal, pelvic, or inguinal lymphadenopathy. Reproductive: Uterus and bilateral adnexa are unremarkable. Other: Trace simple free fluid. No intraperitoneal free gas. No organized fluid collection. Musculoskeletal:  Periumbilical hernia with a short loop of small bowel noted and described above. Superiorly there is likely a tiny fat containing ventral wall hernia with an abdominal defect that is difficult to measure considering it's small size (7:63, 3:40). Inferiorly there is another tiny fat and fluid containing ventral wall hernia with an abdominal defect of 2 mm (7:70, 3:57). No suspicious lytic or blastic osseous lesions. No acute displaced fracture. Multilevel degenerative changes of the spine. IMPRESSION: 1. Small-bowel obstruction with transition point at the entry site of an umbilical hernia. Associated closed loop obstruction of a short segment of small bowel within the umbilical hernia. Concern for ischemia and incarceration of the bowel loop within the hernia. Recommend emergent surgical consultation. 2. Cirrhosis with portal hypertension. A  2.1cm indetemriante fluid density lesion with otherwise markedly limited evaluation for focal hepatic lesion on this single phase portal venous study. Recommend MRI liver protocol for further evaluation. 3. Couple of other tiny ventral wall hernia containing fat and free fluid. 4. Scattered sigmoid diverticulosis with no acute diverticulitis. 5. Trace ascites. These results were called by telephone at the time of interpretation on 02/27/2021 at 4:29 pm to provider Dr. Nanda Quinton, who verbally acknowledged these results. Electronically Signed   By: Iven Finn M.D.   On: 02/27/2021 16:38    Review of Systems  Constitutional: Positive for appetite change.  HENT: Negative.   Eyes:       Recent diagnosis of bilateral cataracts  Respiratory: Negative.   Cardiovascular: Negative.   Gastrointestinal: Positive for abdominal distention, abdominal pain, diarrhea, nausea and vomiting.  Genitourinary: Negative.   Musculoskeletal: Positive for arthralgias.  Skin: Negative.   Allergic/Immunologic: Negative.   Neurological: Negative.   Hematological: Negative.    Psychiatric/Behavioral: Negative.    Blood pressure (!) 183/84, pulse 76, temperature 98.4 F (36.9 C), temperature source Oral, resp. rate 19, height 5\' 1"  (1.549 m), weight 81.6 kg, SpO2 99 %. Physical Exam Constitutional:      General: She is not in acute distress.    Appearance: Normal appearance.  HENT:     Head: Normocephalic.     Right Ear: External ear normal.     Left Ear: External ear normal.     Nose: Nose normal.     Mouth/Throat:     Mouth: Mucous membranes are dry.  Eyes:     Pupils: Pupils are equal, round, and reactive to light.  Cardiovascular:     Rate and Rhythm: Normal rate and regular rhythm.     Pulses: Normal pulses.     Heart sounds: Normal heart sounds.  Pulmonary:     Effort: Pulmonary effort is normal. No respiratory distress.     Breath sounds: Normal breath sounds. No stridor. No wheezing or rhonchi.  Abdominal:     General: There is distension.     Tenderness: There is abdominal tenderness. There is no guarding or rebound.     Hernia: A hernia is present.     Comments: Incarcerated umbilical hernia with some ecchymosis of the overlying skin  Musculoskeletal:        General: No tenderness.     Cervical back: Neck supple.  Skin:    General: Skin is warm.     Capillary Refill: Capillary refill takes 2 to 3 seconds.  Neurological:     Mental Status: She is alert and oriented to person, place, and time.  Psychiatric:        Mood and Affect: Mood normal.     Assessment/Plan: Incarcerated umbilical hernia causing bowel obstruction It appears she has cirrhosis based on CT scan findings as well as liver function tests Hypertension  I recommend medical admission for management of her cirrhosis and hypertension.  Her blood pressure is quite elevated in the emergency department.  I also recommend proceeding with emergent repair of incarcerated umbilical hernia and possible bowel resection. COVID test and INR are pending right now.  I advised her that  she is at significantly increased risk for perioperative complications in light of her cirrhosis.  She denies drinking alcohol and was unaware of this issue.  I discussed the planned procedure, risks, and benefits and she is agreeable.  I discussed the expected postoperative course.  Zenovia Jarred 02/27/2021, 5:48 PM

## 2021-02-27 NOTE — ED Provider Notes (Signed)
Emergency Department Provider Note   I have reviewed the triage vital signs and the nursing notes.   HISTORY  Chief Complaint Hernia   HPI Samantha Evans is a 60 y.o. female known history of umbilical hernia last reduced in the emergency department 4 days ago returns with vomiting and increased pain and swelling in the umbilicus.  Patient states she has had vomiting along with diarrhea especially over the past 24 hours.  She has been unable to keep much of anything down.  She has had some darkening over the umbilicus area.  She states she is actually had a palpable knot here for several months which has been intermittently painful but ultimately required ED evaluation 4 days ago.  This was reduced and she was referred to the surgery service as an outpatient but her appointment is in May.  She returns today with worsening pain and vomiting.    No past medical history on file.  There are no problems to display for this patient.   No past surgical history on file.  Allergies Patient has no known allergies.  Family History  Problem Relation Age of Onset  . Cancer Father     Social History Social History   Tobacco Use  . Smoking status: Current Every Day Smoker  . Smokeless tobacco: Never Used  Substance Use Topics  . Alcohol use: Not Currently    Review of Systems  Constitutional: No fever/chills Eyes: No visual changes. ENT: No sore throat. Cardiovascular: Denies chest pain. Respiratory: Denies shortness of breath. Gastrointestinal: Positive abdominal pain. Positive nausea and vomiting.  No diarrhea.  No constipation. Genitourinary: Negative for dysuria. Musculoskeletal: Negative for back pain. Skin: Negative for rash. Neurological: Negative for headaches, focal weakness or numbness.  10-point ROS otherwise negative.  ____________________________________________   PHYSICAL EXAM:  VITAL SIGNS: ED Triage Vitals  Enc Vitals Group     BP 02/27/21 1332 (!)  141/84     Pulse Rate 02/27/21 1332 74     Resp 02/27/21 1332 (!) 22     Temp 02/27/21 1332 98.2 F (36.8 C)     Temp Source 02/27/21 1332 Oral     SpO2 02/27/21 1332 100 %     Weight 02/27/21 1346 180 lb (81.6 kg)     Height 02/27/21 1346 5\' 1"  (1.549 m)   Constitutional: Alert and oriented. Well appearing and in no acute distress. Eyes: Conjunctivae are normal.  Head: Atraumatic. Nose: No congestion/rhinnorhea. Mouth/Throat: Mucous membranes are moist.   Neck: No stridor.   Cardiovascular: Normal rate, regular rhythm. Good peripheral circulation. Grossly normal heart sounds.   Respiratory: Normal respiratory effort.  No retractions. Lungs CTAB. Gastrointestinal: Soft with palpable umbilical hernia with some overlying dusky appearance. No distention.  Musculoskeletal: No gross deformities of extremities. Neurologic:  Normal speech and language. No gross focal neurologic deficits are appreciated.  Skin:  Skin is warm, dry and intact. No rash noted.  ____________________________________________   LABS (all labs ordered are listed, but only abnormal results are displayed)  Labs Reviewed  CBC WITH DIFFERENTIAL/PLATELET - Abnormal; Notable for the following components:      Result Value   RBC 3.80 (*)    Platelets 135 (*)    All other components within normal limits  COMPREHENSIVE METABOLIC PANEL - Abnormal; Notable for the following components:   Glucose, Bld 107 (*)    BUN 25 (*)    Creatinine, Ser 1.18 (*)    Calcium 10.4 (*)    Albumin  3.0 (*)    AST 105 (*)    ALT 72 (*)    Total Bilirubin 2.8 (*)    GFR, Estimated 53 (*)    All other components within normal limits  RESP PANEL BY RT-PCR (FLU A&B, COVID) ARPGX2  LIPASE, BLOOD  LACTIC ACID, PLASMA  LACTIC ACID, PLASMA  PROTIME-INR  TYPE AND SCREEN   ____________________________________________  RADIOLOGY  CT ABDOMEN PELVIS W CONTRAST  Result Date: 02/27/2021 CLINICAL DATA:  Abdominal pain.  Incarcerated  hernia suspected. EXAM: CT ABDOMEN AND PELVIS WITH CONTRAST TECHNIQUE: Multidetector CT imaging of the abdomen and pelvis was performed using the standard protocol following bolus administration of intravenous contrast. CONTRAST:  127mL OMNIPAQUE IOHEXOL 300 MG/ML  SOLN COMPARISON:  CT abdomen pelvis 02/23/2010 FINDINGS: Lower chest: No acute abnormality. Hepatobiliary: Nodular hepatic contour with an enlarged caudate lobe. Several hypodense lesions within the liver are smaller than 1 cm in too small to characterize. There is a fluid density lesion within the right hepatic lobe measuring up to 2.1 cm (3:14). Status post cholecystectomy. No biliary dilatation. Pancreas: No focal lesion. Normal pancreatic contour. No surrounding inflammatory changes. No main pancreatic ductal dilatation. Spleen: The spleen is enlarged measuring up to 14 cm. No focal abnormality. Adrenals/Urinary Tract: No adrenal nodule bilaterally. Bilateral kidneys enhance symmetrically. Subcentimeter hypodensities are too small to characterize. No hydronephrosis. No hydroureter. The urinary bladder is unremarkable. Stomach/Bowel: Proximal to mid small bowel is dilated with fluid. There is a transition point at the entrance of the small bowel into an umbilical hernia (5:63). A short segment of small bowel is noted to be dilated with fluid within the umbilical hernia with findings suggestive of a closed loop obstruction at the entry and exit sites. Associated bowel wall thickening of this short segment of bowel is noted. No definite pneumatosis. Question trace free fluid surrounding this bowel loop. The remainder of the distal small bowel is completely collapsed. Small amount of stool is noted within the colon. Scattered sigmoid diverticulosis. No large bowel dilatation or bowel wall thickening. The appendix is not definitely identified. Vascular/Lymphatic: Trace venous collateral are noted within the upper abdomen. The portal, splenic, superior  mesenteric veins are patent. No abdominal aorta or iliac aneurysm. At least moderate atherosclerotic plaque of the aorta and its branches. No abdominal, pelvic, or inguinal lymphadenopathy. Reproductive: Uterus and bilateral adnexa are unremarkable. Other: Trace simple free fluid. No intraperitoneal free gas. No organized fluid collection. Musculoskeletal: Periumbilical hernia with a short loop of small bowel noted and described above. Superiorly there is likely a tiny fat containing ventral wall hernia with an abdominal defect that is difficult to measure considering it's small size (7:63, 3:40). Inferiorly there is another tiny fat and fluid containing ventral wall hernia with an abdominal defect of 2 mm (7:70, 3:57). No suspicious lytic or blastic osseous lesions. No acute displaced fracture. Multilevel degenerative changes of the spine. IMPRESSION: 1. Small-bowel obstruction with transition point at the entry site of an umbilical hernia. Associated closed loop obstruction of a short segment of small bowel within the umbilical hernia. Concern for ischemia and incarceration of the bowel loop within the hernia. Recommend emergent surgical consultation. 2. Cirrhosis with portal hypertension. A 2.1cm indetemriante fluid density lesion with otherwise markedly limited evaluation for focal hepatic lesion on this single phase portal venous study. Recommend MRI liver protocol for further evaluation. 3. Couple of other tiny ventral wall hernia containing fat and free fluid. 4. Scattered sigmoid diverticulosis with no acute diverticulitis. 5. Trace  ascites. These results were called by telephone at the time of interpretation on 02/27/2021 at 4:29 pm to provider Dr. Nanda Quinton, who verbally acknowledged these results. Electronically Signed   By: Iven Finn M.D.   On: 02/27/2021 16:38    ____________________________________________   PROCEDURES  Procedure(s) performed:   Procedures  None   ____________________________________________   INITIAL IMPRESSION / ASSESSMENT AND PLAN / ED COURSE  Pertinent labs & imaging results that were available during my care of the patient were reviewed by me and considered in my medical decision making (see chart for details).   Patient presents to the emergency department for evaluation of return of umbilical hernia.  Clinically she is incarcerated which is also out appears on imaging.  Discussed with radiology.  She is afebrile and vital signs are within normal limits other than mildly elevated blood pressure.  Have ordered Covid swab and lactate.  Discussed with the nurse that the patient may ultimately require operative repair and is in these urgently.  Will page general surgery to discuss.  05:44 PM  Spoke with Dr. Grandville Silos with general surgery.  He plans to take the patient to the OR for repair this evening.  Patient has new onset cirrhosis which was not a prior diagnosis for her.  Her bilirubin is up to 2.8 with elevated LFTs.  Patient also with significant hypertension.  He is requesting medicine admit to assist with these issues and he will manage surgically.   Discussed patient's case with [facility], Dr. Jonnie Finner to request admission. Patient and family (if present) updated with plan. Care transferred to [facility] service.  I reviewed all nursing notes, vitals, pertinent old records, EKGs, labs, imaging (as available).  ____________________________________________  FINAL CLINICAL IMPRESSION(S) / ED DIAGNOSES  Final diagnoses:  Incarcerated hernia    MEDICATIONS GIVEN DURING THIS VISIT:  Medications  cefoTEtan (CEFOTAN) 2 g in sodium chloride 0.9 % 100 mL IVPB (has no administration in time range)  iohexol (OMNIPAQUE) 300 MG/ML solution 100 mL (100 mLs Intravenous Contrast Given 02/27/21 1613)  sodium chloride 0.9 % bolus 500 mL (0 mLs Intravenous Stopped 02/27/21 1822)    Note:  This document was prepared using Dragon voice recognition  software and may include unintentional dictation errors.  Nanda Quinton, MD, [facility] Emergency Medicine    Analeigh Aries, [facility], MD 02/27/21 [phone]

## 2021-02-27 NOTE — Progress Notes (Signed)
Spoke to Dr Grandville Silos about NG tube output being bright red, new orders to hold off on suction for a few hours, then resume, and monitor output.

## 2021-02-27 NOTE — Op Note (Signed)
  02/27/2021  8:17 PM  PATIENT:  Samantha Evans  60 y.o. female  PRE-OPERATIVE DIAGNOSIS:  INCARCERATED UMBILICAL HERNIA, SMALL BOWEL OBSTRUCTION  POST-OPERATIVE DIAGNOSIS:  INCARCERATED UMBILICAL HERNIA, SMALL BOWEL VIABLE  PROCEDURE:  Procedure(s): REPAIR INCARCERATED UMBILICAL HERNIA  SURGEON:  Surgeon(s): Georganna Skeans, MD  ASSISTANTS: none   ANESTHESIA:   local and general  EBL:  Total I/O In: 1100 [I.V.:1000; IV Piggyback:100] Out: 725 [Other:700; Blood:25]  BLOOD ADMINISTERED:none  DRAINS: none   SPECIMEN:  No Specimen  DISPOSITION OF SPECIMEN:  N/A  COUNTS:  YES  DICTATION: .Dragon Dictation Findings: Loop of small bowel incarcerated in umbilical hernia was viable without perforation  Procedure in detail: Informed consent was obtained.  She received intravenous antibiotics.  She was brought emergently to the operating room and general endotracheal anesthesia was administered by the anesthesia staff.  Her abdomen was prepped and draped in sterile fashion.  Timeout procedure was performed.  I made a transverse infraumbilical incision.  Subcutaneous tissues were dissected down to the fascia.  There were a lot of subcutaneous dilated veins consistent with her cirrhosis.  These were cauterized.  The hernia sac was identified.  It was freed up circumferentially including freeing it from the overlying umbilical skin superiorly.  I then carefully opened the sac.  It contained serosanguineous fluid.  The sac was excised.  There was a loop of bowel with the obstruction and the hernia but the loop of bowel was viable.  I enlarged the fascial defect laterally.  I inspected the segment of small bowel that had been stuck in the hernia.  It was not perforated and it was viable.  I was then able to easily reduce it back into the abdomen.  The fascia was circumferentially cleared from overlying subcutaneous tissues.  I used cautery for good hemostasis.  There was some oozing along the  fascial edge of the hernia so I placed multiple interrupted 3-0 Vicryl figure-of-eight sutures to get good hemostasis.  Once this was achieved, I repaired the hernia primarily using interrupted #1 Novafil sutures.  I did not want to place a mesh due to the recently incarcerated bowel.  Subcutaneous tissues were irrigated.  I ensured hemostasis.  Local was injected.  The umbilical stalk was tacked down to the underlying tissues with 2-0 Vicryl.  Subcutaneous tissues were closed with multiple interrupted 3-0 Vicryl's and the skin was closed with a running 4-0 Monocryl subcuticular followed by Dermabond.  All counts were correct.  She tolerated the procedure well without apparent complication was taken recovery in stable condition. PATIENT DISPOSITION:  PACU - hemodynamically stable.   Delay start of Pharmacological VTE agent (>24hrs) due to surgical blood loss or risk of bleeding:  yes  Georganna Skeans, MD, MPH, FACS Pager: [phone]  4/14/20228:17 PM

## 2021-02-27 NOTE — ED Triage Notes (Signed)
Emergency Medicine Provider Triage Evaluation Note  Samantha Evans , a 60 y.o. female  was evaluated in triage.  Pt complains of hernia, increasing pain.  Review of Systems  Positive: Loss of appetitive, vomiting with diarrhea smell, umbilical pain    Negative: fevers  Physical Exam  There were no vitals taken for this visit. Gen:   Awake, no distress   HEENT:  Atraumatic  Resp:  Normal effort  Cardiac:  Normal rate  Abd:   Umbilical hernia, erythematous, hard, extremely tender  MSK:   Moves extremities without difficulty  Neuro:  Speech clear   Medical Decision Making  Medically screening exam initiated at 1:30 PM.  Appropriate orders placed.  Samantha Evans was informed that the remainder of the evaluation will be completed by another provider, this initial triage assessment does not replace that evaluation, and the importance of remaining in the ED until their evaluation is complete.  Clinical Impression  Patient with umbilical hernia, appears erythematous, very tender to the touch.  Reportedly vomiting fecal smelling like vomit.  CT of the abdomen was ordered along with basic labs to rule out incarcerated hernia/perforation.  Stable appearing, no active vomiting.   Samantha Balding, PA-C 02/27/21 1529

## 2021-02-27 NOTE — Anesthesia Procedure Notes (Signed)
Procedure Name: Intubation Date/Time: 02/27/2021 7:23 PM Performed by: Jearld Pies, CRNA Pre-anesthesia Checklist: Patient identified, Emergency Drugs available, Suction available and Patient being monitored Patient Re-evaluated:Patient Re-evaluated prior to induction Oxygen Delivery Method: Circle System Utilized Preoxygenation: Pre-oxygenation with 100% oxygen Induction Type: IV induction, Rapid sequence and Cricoid Pressure applied Laryngoscope Size: Miller and 2 Grade View: Grade I Tube type: Oral Tube size: 7.5 mm Number of attempts: 1 Airway Equipment and Method: Stylet Placement Confirmation: ETT inserted through vocal cords under direct vision,  positive ETCO2 and breath sounds checked- equal and bilateral Secured at: 21 cm Tube secured with: Tape Dental Injury: Teeth and Oropharynx as per pre-operative assessment

## 2021-02-27 NOTE — ED Triage Notes (Signed)
Pt arrived c/o of increased pain from her umbilical hernia  Pt states she has had N/VD   Pain is 8/10 at this time

## 2021-02-27 NOTE — Transfer of Care (Signed)
Immediate Anesthesia Transfer of Care Note  Patient: Samantha Evans  Procedure(s) Performed: REPAIR INCARCERATED UMBILICAL HERNIA (N/A Abdomen)  Patient Location: PACU  Anesthesia Type:General  Level of Consciousness: awake, alert  and oriented  Airway & Oxygen Therapy: Patient Spontanous Breathing and Patient connected to face mask oxygen  Post-op Assessment: Report given to RN and Post -op Vital signs reviewed and stable  Post vital signs: Reviewed and stable  Last Vitals:  Vitals Value Taken Time  BP 139/70 02/27/21 2027  Temp 36.4 C 02/27/21 2025  Pulse 67 02/27/21 2029  Resp 13 02/27/21 2029  SpO2 98 % 02/27/21 2029  Vitals shown include unvalidated device data.  Last Pain:  Vitals:   02/27/21 1822  TempSrc: Oral  PainSc:      Left nare NG tube placed to LWS.    Complications: No complications documented.

## 2021-02-27 NOTE — H&P (Addendum)
[facility] History & Physical  Kelly Splinter MD  Referring Provider: Dr Coralyn Helling, [facility] ED  Samantha Evans 02/27/2021  Chief Complaint: pt w/ N/V and incarcerated hernia also w/ CT changes/ ^LFT's suggesting significant liver disease.  HPI: The patient is a 60 y.o. year-old w/ no sig PMH. She was seen on 4/10 recently in ED for umbilical hernia issues. It was reduced and she was sent home. Returned to ED today w/ diarrhea and vomiting and swelling in that area.  CT abd showed umbilical hernia w/ secondary SBO and signs of inflammation. Gen surg has been consulted and they are planning to take the pt to OR tonight.  Pt's BP's are high and LFT"s are abnormal w/ CT abd liver findings suggesting cirrhosis +/- other findings. We are asked to see for admission.    Pt seen in PACU.  Pt is groggy post -op but knows her location.  Denies any sig ETOH use, does smoke cigarettes. Lives w/ her son at home.      ROS  denies CP  no joint pain   no HA  no blurry vision  no rash  no dysuria  no difficulty voiding  no change in urine color   Past Medical History No past medical history on file. Past Surgical History No past surgical history on file. Family History  Family History  Problem Relation Age of Onset  . Cancer Father    Social History  reports that she has been smoking. She has never used smokeless tobacco. She reports previous alcohol use. No history on file for drug use. Allergies No Known Allergies Home medications Prior to Admission medications   Medication Sig Start Date End Date Taking? Authorizing Provider  naproxen (NAPROSYN) 500 MG tablet Take 1 tablet (500 mg total) by mouth 2 (two) times daily. Patient not taking: Reported on 02/27/2021 08/31/20   Zigmund Gottron, NP       Exam Gen a bit lethargic post op, seen in PACU No rash, cyanosis or gangrene Sclera anicteric, NG tube in R nare No jvd or bruits Chest clear bilat to bases RRR no MRG Abd soft mildly  tender mid abd, fresh scar below the umbilicus  no ascites or HSM on exam GU defer MS no joint effusions or deformity Ext no leg or UE edema, no wounds or ulcers  Neuro is groggy postop, nonfocal, Ox 3    Home meds:  - none      Na 136  K 4.1  CO2 22  BUN 25  Cr 1.18  Alb 3.0  AST 105  ALT 72  Tbili 2.8   eGFR 53   WBC 6K  Hb 12.6  plt 135.  COVID neg/ flu A+ B neg   UA 6-10 rbc and wbc, rare bact, 100 prot   CT abd / pelvis 4/14 - IMPRESSION: 1. Small-bowel obstruction with transition point at the entry site of an umbilical hernia. Associated closed loop obstruction of a short segment of small bowel within the umbilical hernia. Concern for ischemia and incarceration of the bowel loop within the hernia. Recommend emergent surgical consultation. 2. Cirrhosis with portal hypertension. A 2.1cm indetemriante fluid density lesion with otherwise markedly limited evaluation for focal hepatic lesion on this single phase portal venous study. Recommend MRI liver protocol for further evaluation. 3. Couple of other tiny ventral wall hernia containing fat and free fluid. 4. Scattered sigmoid diverticulosis with no acute diverticulitis. 5. Trace ascites.   BP 162/74  HR 74  RR 18  Temp 98.2  100% on RA    Assessment/ Plan: 1. SBO w/ umbilical hernia - sp OR this evening, per Dr Grandville Silos there was no ischemia, did not require any bowel resection, and there was mild enlargement of the peritoneal wall veins c/w portal HTN but not real severe. NG in place, npo for now per Gen Surg, admit to med-surg bed.  2. Abnormal CT/ probable cirrhosis / indeterminate liver lesion - abd CT suggesting cirrhosis and portal HTN. Also low albumin, mild ^INR 1.3, mild ^AST/ ALT/ Tbili.  Also indeterminate fluid density lesion, per Rad reading MRI liver protocol recommended for further evaluation.  No symptoms of cirrhosis, no ascites on exam or CT. MRI ordered. Consider GI evaluation.  3. Mild AKI - likely d/t dehydration.  Plan  IVF"s, repeat bmet in am.       Kelly Splinter  MD 02/27/2021, 6:28 PM

## 2021-02-28 ENCOUNTER — Encounter (HOSPITAL_COMMUNITY): Payer: Self-pay | Admitting: General Surgery

## 2021-02-28 ENCOUNTER — Other Ambulatory Visit: Payer: Self-pay

## 2021-02-28 ENCOUNTER — Inpatient Hospital Stay (HOSPITAL_COMMUNITY): Payer: Medicaid Other

## 2021-02-28 DIAGNOSIS — K420 Umbilical hernia with obstruction, without gangrene: Secondary | ICD-10-CM | POA: Diagnosis not present

## 2021-02-28 DIAGNOSIS — Z4682 Encounter for fitting and adjustment of non-vascular catheter: Secondary | ICD-10-CM | POA: Diagnosis not present

## 2021-02-28 DIAGNOSIS — K7469 Other cirrhosis of liver: Secondary | ICD-10-CM | POA: Diagnosis not present

## 2021-02-28 DIAGNOSIS — R1033 Periumbilical pain: Secondary | ICD-10-CM | POA: Diagnosis not present

## 2021-02-28 DIAGNOSIS — N179 Acute kidney failure, unspecified: Secondary | ICD-10-CM | POA: Diagnosis not present

## 2021-02-28 DIAGNOSIS — K56609 Unspecified intestinal obstruction, unspecified as to partial versus complete obstruction: Secondary | ICD-10-CM | POA: Diagnosis not present

## 2021-02-28 LAB — CBC
HCT: 32.7 % — ABNORMAL LOW (ref 36.0–46.0)
Hemoglobin: 11.2 g/dL — ABNORMAL LOW (ref 12.0–15.0)
MCH: 33.4 pg (ref 26.0–34.0)
MCHC: 34.3 g/dL (ref 30.0–36.0)
MCV: 97.6 fL (ref 80.0–100.0)
Platelets: 88 10*3/uL — ABNORMAL LOW (ref 150–400)
RBC: 3.35 MIL/uL — ABNORMAL LOW (ref 3.87–5.11)
RDW: 15 % (ref 11.5–15.5)
WBC: 6 10*3/uL (ref 4.0–10.5)
nRBC: 0 % (ref 0.0–0.2)

## 2021-02-28 LAB — HIV ANTIBODY (ROUTINE TESTING W REFLEX): HIV Screen 4th Generation wRfx: NONREACTIVE

## 2021-02-28 LAB — COMPREHENSIVE METABOLIC PANEL
ALT: 63 U/L — ABNORMAL HIGH (ref 0–44)
AST: 81 U/L — ABNORMAL HIGH (ref 15–41)
Albumin: 2.6 g/dL — ABNORMAL LOW (ref 3.5–5.0)
Alkaline Phosphatase: 89 U/L (ref 38–126)
Anion gap: 4 — ABNORMAL LOW (ref 5–15)
BUN: 25 mg/dL — ABNORMAL HIGH (ref 6–20)
CO2: 21 mmol/L — ABNORMAL LOW (ref 22–32)
Calcium: 9.8 mg/dL (ref 8.9–10.3)
Chloride: 108 mmol/L (ref 98–111)
Creatinine, Ser: 1.06 mg/dL — ABNORMAL HIGH (ref 0.44–1.00)
GFR, Estimated: >60 mL/min (ref >60)
Glucose, Bld: 152 mg/dL — ABNORMAL HIGH (ref 70–99)
Potassium: 4.1 mmol/L (ref 3.5–5.1)
Sodium: 133 mmol/L — ABNORMAL LOW (ref 135–145)
Total Bilirubin: 1.5 mg/dL — ABNORMAL HIGH (ref 0.3–1.2)
Total Protein: 6.9 g/dL (ref 6.5–8.1)

## 2021-02-28 IMAGING — DX DG ABD PORTABLE 1V
1 series · 1 of 1 positions shown · non-contrast
Comparison: None.

CLINICAL DATA: NG tube placement

EXAM:
PORTABLE ABDOMEN - 1 VIEW

[abdomen supine]
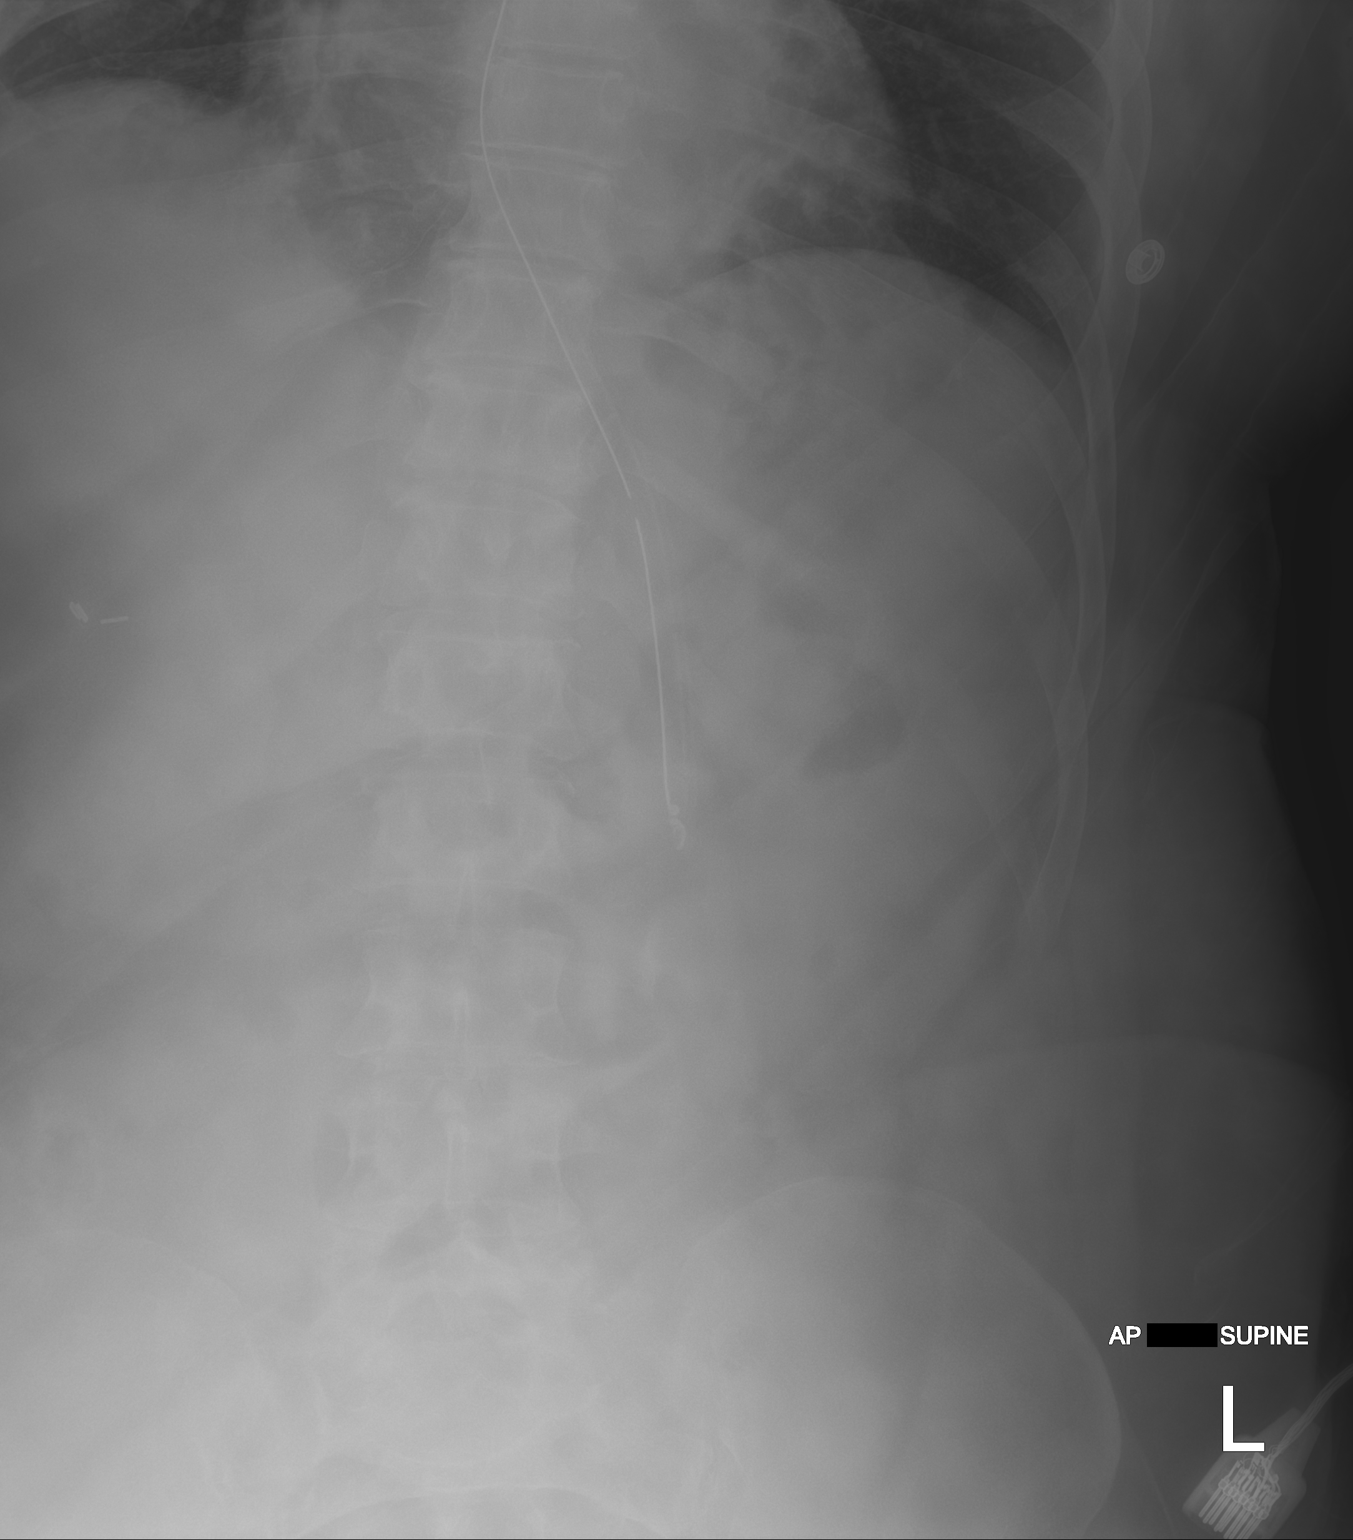

[1 of 1 positions shown; findings below may reference images not displayed]

FINDINGS: Tip and side port of the esophageal catheter project within the
stomach.
IMPRESSION: Esophageal catheter tip in the stomach.

## 2021-02-28 MED ORDER — PANTOPRAZOLE SODIUM 40 MG IV SOLR
40.0000 mg | Freq: Two times a day (BID) | INTRAVENOUS | Status: DC
  Administered 2021-02-28 – 2021-03-02 (×5): 40 mg via INTRAVENOUS
  Filled 2021-02-28 (×4): qty 40

## 2021-02-28 NOTE — Evaluation (Signed)
Physical Therapy Evaluation Patient Details Name: Samantha Evans MRN: [id number] DOB: 1961-04-02 Today's Date: 02/28/2021   History of Present Illness  Patient is a 60 y/o female who presents on 02/27/21 with N/V and pain from umbilical hernia. Abdominal CT-umbilical hernia w/ secondary SBO and signs of inflammation now s/p repair of incarcerated umbilical hernia 4/31/54. No PMH.  Clinical Impression  Patient presents with impaired balance, abdominal pain and impaired mobility s/p above. Pt lives at home with son, who works, and reports being independent for ADLs/IADLs and driving PTA. Today, pt tolerated transfers and gait training with Min guard-Min A for balance/safety. Pt unsteady during ambulation drifting in both directions and reaching for counter for support with 1 standing rest break. 2/4 DOE noted. VSS with Sp02 >92% on RA. Encouraged walking to bathroom and hallway ambulation with nursing over the weekend as able. Pt will likely progress well with increased activity. Will follow acutely to maximize independence and mobility prior to return home.    Follow Up Recommendations No PT follow up;Supervision for mobility/OOB    Equipment Recommendations  None recommended by PT    Recommendations for Other Services       Precautions / Restrictions Precautions Precautions: Other (comment);Fall Precaution Comments: NGT clamped Required Braces or Orthoses: Other Brace Other Brace: abdominal binder for OOB Restrictions Weight Bearing Restrictions: No      Mobility  Bed Mobility Overal bed mobility: Needs Assistance Bed Mobility: Rolling;Sidelying to Sit Rolling: Supervision Sidelying to sit: Supervision;HOB elevated       General bed mobility comments: Cues for log roll technique for comfort on abdomen. Use of rails.    Transfers Overall transfer level: Needs assistance Equipment used: None Transfers: Sit to/from Stand Sit to Stand: Min guard         General transfer  comment: Min guard for safety. Stood from [employer], transferred to chair post ambulation.  Ambulation/Gait Ambulation/Gait assistance: Min assist;Min guard Gait Distance (Feet): 150 Feet Assistive device: None Gait Pattern/deviations: Step-through pattern;Decreased stride length;Drifts right/left Gait velocity: decreased Gait velocity interpretation: <1.31 ft/sec, indicative of household ambulator General Gait Details: Slow, unsteady gait with drifting in both directions; holding up abdomen with UEs for comfort. VSS on RA with sp02 >92% during activity. 1 standing rest break. Holding onto counter for support PRN.  Stairs            Wheelchair Mobility    Modified Rankin (Stroke Patients Only)       Balance Overall balance assessment: Needs assistance Sitting-balance support: Feet supported;No upper extremity supported Sitting balance-Leahy Scale: Good     Standing balance support: During functional activity Standing balance-Leahy Scale: Fair Standing balance comment: Close Min guard-Min A for dynamic tasks.                             Pertinent Vitals/Pain Pain Assessment: 0-10 Pain Score: 3  Pain Location: abdomen Pain Descriptors / Indicators: Other (Comment) (stinging) Pain Intervention(s): Monitored during session;Repositioned    Home Living Family/patient expects to be discharged to:: Private residence Living Arrangements: Children (with son) Available Help at Discharge: Family;Available PRN/intermittently Type of Home: Mobile home Home Access: Stairs to enter Entrance Stairs-Rails: Right Entrance Stairs-Number of Steps: 2-3 Home Layout: One level Home Equipment: None      Prior Function Level of Independence: Independent         Comments: Drives, doing ADLs/IADLs.     Hand Dominance  Extremity/Trunk Assessment   Upper Extremity Assessment Upper Extremity Assessment: Defer to OT evaluation    Lower Extremity  Assessment Lower Extremity Assessment: Overall WFL for tasks assessed    Cervical / Trunk Assessment Cervical / Trunk Assessment: Normal  Communication   Communication: No difficulties  Cognition Arousal/Alertness: Awake/alert Behavior During Therapy: WFL for tasks assessed/performed Overall Cognitive Status: Within Functional Limits for tasks assessed                                 General Comments: for basic mobility tasks      General Comments General comments (skin integrity, edema, etc.): RN made aware of need to order abdominal binder    Exercises     Assessment/Plan    PT Assessment Patient needs continued PT services  PT Problem List Decreased mobility;Pain;Decreased balance;Decreased skin integrity;Decreased activity tolerance       PT Treatment Interventions Therapeutic exercise;Balance training;Gait training;Stair training;Patient/family education;Therapeutic activities;Functional mobility training    PT Goals (Current goals can be found in the Care Plan section)  Acute Rehab PT Goals Patient Stated Goal: to get better and go home PT Goal Formulation: With patient Time For Goal Achievement: 03/14/21 Potential to Achieve Goals: Good    Frequency Min 3X/week   Barriers to discharge Decreased caregiver support;Inaccessible home environment stairs    Co-evaluation               AM-PAC PT "6 Clicks" Mobility  Outcome Measure Help needed turning from your back to your side while in a flat bed without using bedrails?: None Help needed moving from lying on your back to sitting on the side of a flat bed without using bedrails?: A Little Help needed moving to and from a bed to a chair (including a wheelchair)?: A Little Help needed standing up from a chair using your arms (e.g., wheelchair or bedside chair)?: A Little Help needed to walk in hospital room?: A Little Help needed climbing 3-5 steps with a railing? : A Little 6 Click Score:  19    End of Session   Activity Tolerance: Patient tolerated treatment well Patient left: in chair;with call bell/phone within reach Nurse Communication: Mobility status;Other (comment) (abdominal binder, pt on RA) PT Visit Diagnosis: Pain;Unsteadiness on feet (R26.81);Other abnormalities of gait and mobility (R26.89) Pain - part of body:  (abdomen)    Time: 5409-8119 PT Time Calculation (min) (ACUTE ONLY): 22 min   Charges:   PT Evaluation $PT Eval Moderate Complexity: 1 Mod          Marisa Severin, PT, DPT Acute Rehabilitation Services Pager [phone] Office [phone]      Marguarite Arbour A Sabra Heck 02/28/2021, 3:08 PM

## 2021-02-28 NOTE — Progress Notes (Signed)
Progress Note  1 Day Post-Op  Subjective: Patient with some mild abdominal soreness. No flatus yet. NGT in place and reconnected to LIWS.   Objective: Vital signs in last 24 hours: Temp:  [97.5 F (36.4 C)-98.7 F (37.1 C)] 98.3 F (36.8 C) (04/15 0943) Pulse Rate:  [64-76] 66 (04/15 0943) Resp:  [13-22] 16 (04/15 0943) BP: (136-183)/(63-118) 139/63 (04/15 0943) SpO2:  [89 %-100 %] 89 % (04/15 0943) Weight:  [81.6 kg] 81.6 kg (04/14 1346)    Intake/Output from previous day: 04/14 0701 - 04/15 0700 In: 1880 [I.V.:1250; NG/GT:30; IV Piggyback:600] Out: 975 [Urine:250; Blood:25] Intake/Output this shift: No intake/output data recorded.  PE: General: pleasant, WD, obese female who is laying in bed in NAD Heart: regular, rate, and rhythm.  Lungs: CTAB, no wheezes, rhonchi, or rales noted.  Respiratory effort nonlabored Abd: soft, NT, ND, +BS, incision c/d/i, NGT with small amount clear brown fluid    Lab Results:  Recent Labs    02/27/21 1345 02/28/21 0128  WBC 6.2 6.0  HGB 12.6 11.2*  HCT 36.5 32.7*  PLT 135* 88*   BMET Recent Labs    02/27/21 1345 02/28/21 0128  NA 136 133*  K 4.1 4.1  CL 106 108  CO2 22 21*  GLUCOSE 107* 152*  BUN 25* 25*  CREATININE 1.18* 1.06*  CALCIUM 10.4* 9.8   PT/INR Recent Labs    02/27/21 1900  LABPROT 16.0*  INR 1.3*   CMP     Component Value Date/Time   NA 133 (L) 02/28/2021 0128   K 4.1 02/28/2021 0128   CL 108 02/28/2021 0128   CO2 21 (L) 02/28/2021 0128   GLUCOSE 152 (H) 02/28/2021 0128   BUN 25 (H) 02/28/2021 0128   CREATININE 1.06 (H) 02/28/2021 0128   CALCIUM 9.8 02/28/2021 0128   PROT 6.9 02/28/2021 0128   ALBUMIN 2.6 (L) 02/28/2021 0128   AST 81 (H) 02/28/2021 0128   ALT 63 (H) 02/28/2021 0128   ALKPHOS 89 02/28/2021 0128   BILITOT 1.5 (H) 02/28/2021 0128   GFRNONAA >60 02/28/2021 0128   Lipase     Component Value Date/Time   LIPASE 37 02/27/2021 1345       Studies/Results: CT ABDOMEN  PELVIS W CONTRAST  Result Date: 02/27/2021 CLINICAL DATA:  Abdominal pain.  Incarcerated hernia suspected. EXAM: CT ABDOMEN AND PELVIS WITH CONTRAST TECHNIQUE: Multidetector CT imaging of the abdomen and pelvis was performed using the standard protocol following bolus administration of intravenous contrast. CONTRAST:  158mL OMNIPAQUE IOHEXOL 300 MG/ML  SOLN COMPARISON:  CT abdomen pelvis 02/23/2010 FINDINGS: Lower chest: No acute abnormality. Hepatobiliary: Nodular hepatic contour with an enlarged caudate lobe. Several hypodense lesions within the liver are smaller than 1 cm in too small to characterize. There is a fluid density lesion within the right hepatic lobe measuring up to 2.1 cm (3:14). Status post cholecystectomy. No biliary dilatation. Pancreas: No focal lesion. Normal pancreatic contour. No surrounding inflammatory changes. No main pancreatic ductal dilatation. Spleen: The spleen is enlarged measuring up to 14 cm. No focal abnormality. Adrenals/Urinary Tract: No adrenal nodule bilaterally. Bilateral kidneys enhance symmetrically. Subcentimeter hypodensities are too small to characterize. No hydronephrosis. No hydroureter. The urinary bladder is unremarkable. Stomach/Bowel: Proximal to mid small bowel is dilated with fluid. There is a transition point at the entrance of the small bowel into an umbilical hernia (9:62). A short segment of small bowel is noted to be dilated with fluid within the umbilical hernia with findings suggestive  of a closed loop obstruction at the entry and exit sites. Associated bowel wall thickening of this short segment of bowel is noted. No definite pneumatosis. Question trace free fluid surrounding this bowel loop. The remainder of the distal small bowel is completely collapsed. Small amount of stool is noted within the colon. Scattered sigmoid diverticulosis. No large bowel dilatation or bowel wall thickening. The appendix is not definitely identified. Vascular/Lymphatic:  Trace venous collateral are noted within the upper abdomen. The portal, splenic, superior mesenteric veins are patent. No abdominal aorta or iliac aneurysm. At least moderate atherosclerotic plaque of the aorta and its branches. No abdominal, pelvic, or inguinal lymphadenopathy. Reproductive: Uterus and bilateral adnexa are unremarkable. Other: Trace simple free fluid. No intraperitoneal free gas. No organized fluid collection. Musculoskeletal: Periumbilical hernia with a short loop of small bowel noted and described above. Superiorly there is likely a tiny fat containing ventral wall hernia with an abdominal defect that is difficult to measure considering it's small size (7:63, 3:40). Inferiorly there is another tiny fat and fluid containing ventral wall hernia with an abdominal defect of 2 mm (7:70, 3:57). No suspicious lytic or blastic osseous lesions. No acute displaced fracture. Multilevel degenerative changes of the spine. IMPRESSION: 1. Small-bowel obstruction with transition point at the entry site of an umbilical hernia. Associated closed loop obstruction of a short segment of small bowel within the umbilical hernia. Concern for ischemia and incarceration of the bowel loop within the hernia. Recommend emergent surgical consultation. 2. Cirrhosis with portal hypertension. A 2.1cm indetemriante fluid density lesion with otherwise markedly limited evaluation for focal hepatic lesion on this single phase portal venous study. Recommend MRI liver protocol for further evaluation. 3. Couple of other tiny ventral wall hernia containing fat and free fluid. 4. Scattered sigmoid diverticulosis with no acute diverticulitis. 5. Trace ascites. These results were called by telephone at the time of interpretation on 02/27/2021 at 4:29 pm to provider Dr. Nanda Quinton, who verbally acknowledged these results. Electronically Signed   By: Iven Finn M.D.   On: 02/27/2021 16:38   DG Abd Portable 1V  Result Date:  02/28/2021 CLINICAL DATA:  NG tube placement EXAM: PORTABLE ABDOMEN - 1 VIEW COMPARISON:  None. FINDINGS: Tip and side port of the esophageal catheter project within the stomach. IMPRESSION: Esophageal catheter tip in the stomach. Electronically Signed   By: Ulyses Jarred M.D.   On: 02/28/2021 01:40    Anti-infectives: Anti-infectives (From admission, onward)   Start     Dose/Rate Route Frequency Ordered Stop   02/27/21 1800  cefoTEtan (CEFOTAN) 2 g in sodium chloride 0.9 % 100 mL IVPB        2 g 200 mL/hr over 30 Minutes Intravenous  Once 02/27/21 1754 02/27/21 2000       Assessment/Plan Cirrhosis HTN  Incarcerated umbilical hernia  S/P open primary hernia repair 02/27/21 Dr. Grandville Silos - POD#1 - incision c/d/i - ok to clamp NGT and allow clears this AM, remove NGT later today if tolerating - recommend PPI BID  - PT/OT, abdominal binder when OOB  FEN: clamp and CLD, IVF VTE: SCDs, possibly start chemical prophylaxis tomorrow  ID: [email or web address] 4/14  LOS: 1 day    Norm Parcel , [facility] 02/28/2021, 11:01 AM Please see Amion for pager number during day hours 7:00am-4:30pm

## 2021-02-28 NOTE — Progress Notes (Signed)
PROGRESS NOTE    Samantha Evans  UJW:[id number] DOB: [birth year] DOA: 02/27/2021 PCP: Pcp, No    Brief Narrative:  60 year old female admitted to the hospital with nausea, vomiting and was found to have incarcerated umbilical hernia with small bowel obstruction.  She was admitted for operative management.  Incidental finding of underlying cirrhosis noted on CT imaging.   Assessment & Plan:   Active Problems:   Abdominal pain   Incarcerated umbilical hernia   Small bowel obstruction (HCC)   Hepatic cirrhosis ([place])- by CT scan   AKI (acute kidney injury) ([place])   Small bowel obstruction due to incarcerated umbilical hernia -Status post operative repair on 4/14 -Currently has NG tube in place -Await return of bowel function -Postop care per general surgery  Probable cirrhosis -Reported the patient had previous alcohol use -We will check viral hepatitis panel -We will likely need to follow-up with GI as an outpatient for further work-up and management -Noted to have 2.1 cm fluid density in liver, further characterization per MRI liver  Thrombocytopenia -Suspect this is related to underlying liver disease -No signs of bleeding at present -Continue to monitor    DVT prophylaxis: SCDs Start: 02/27/21 2159  Code Status: Full code Family Communication: Discussed with patient Disposition Plan: Status is: Inpatient  Remains inpatient appropriate because:Inpatient level of care appropriate due to severity of illness   Dispo: The patient is from: Home              Anticipated d/c is to: Home              Patient currently is not medically stable to d/c.   Difficult to place patient No         Consultants:   General surgery  Procedures:   Repair of incarcerated umbilical hernia  Antimicrobials:      Subjective: Patient with NG tube in place.  She has not had a bowel movement or passed any flatus yet.  No vomiting.  Objective: Vitals:   02/28/21 0019  02/28/21 0448 02/28/21 0943 02/28/21 1348  BP: (!) 155/78 136/69 139/63 125/74  Pulse: 64 67 66 72  Resp: 19 17 16 16   Temp: 98.4 F (36.9 C) 98 F (36.7 C) 98.3 F (36.8 C) 98.4 F (36.9 C)  TempSrc: Oral Oral  Oral  SpO2: 95% 96% (!) 89% 91%  Weight:      Height:        Intake/Output Summary (Last 24 hours) at 02/28/2021 1456 Last data filed at 02/28/2021 0934 Gross per 24 hour  Intake 1880 ml  Output 975 ml  Net 905 ml   Filed Weights   02/27/21 1346  Weight: 81.6 kg    Examination:  General exam: Appears calm and comfortable  Respiratory system: Clear to auscultation. Respiratory effort normal. Cardiovascular system: S1 & S2 heard, RRR. No JVD, murmurs, rubs, gallops or clicks. No pedal edema. Gastrointestinal system: Abdomen is nondistended, soft and nontender. No organomegaly or masses felt. Normal bowel sounds heard.  NG tube in place Central nervous system: Alert and oriented. No focal neurological deficits. Extremities: Symmetric 5 x 5 power. Skin: No rashes, lesions or ulcers Psychiatry: Judgement and insight appear normal. Mood & affect appropriate.     Data Reviewed: I have personally reviewed following labs and imaging studies  CBC: Recent Labs  Lab 02/23/21 1019 02/27/21 1345 02/28/21 0128  WBC 3.6* 6.2 6.0  NEUTROABS  --  4.5  --   HGB 11.3* 12.6 11.2*  HCT 32.9* 36.5 32.7*  MCV 96.5 96.1 97.6  PLT 93* 135* 88*   Basic Metabolic Panel: Recent Labs  Lab 02/23/21 1019 02/27/21 1345 02/28/21 0128  NA 138 136 133*  K 3.7 4.1 4.1  CL 109 106 108  CO2 26 22 21*  GLUCOSE 127* 107* 152*  BUN 17 25* 25*  CREATININE 0.80 1.18* 1.06*  CALCIUM 10.1 10.4* 9.8   GFR: Estimated Creatinine Clearance: 54.6 mL/min (A) (by C-G formula based on SCr of 1.06 mg/dL (H)). Liver Function Tests: Recent Labs  Lab 02/23/21 1019 02/27/21 1345 02/28/21 0128  AST 159* 105* 81*  ALT 92* 72* 63*  ALKPHOS 124 94 89  BILITOT 1.8* 2.8* 1.5*  PROT 7.0 7.3 6.9   ALBUMIN 2.7* 3.0* 2.6*   Recent Labs  Lab 02/23/21 1019 02/27/21 1345  LIPASE 42 37   No results for input(s): AMMONIA in the last 168 hours. Coagulation Profile: Recent Labs  Lab 02/27/21 1900  INR 1.3*   Cardiac Enzymes: No results for input(s): CKTOTAL, CKMB, CKMBINDEX, TROPONINI in the last 168 hours. BNP (last 3 results) No results for input(s): PROBNP in the last 8760 hours. HbA1C: No results for input(s): HGBA1C in the last 72 hours. CBG: No results for input(s): GLUCAP in the last 168 hours. Lipid Profile: No results for input(s): CHOL, HDL, LDLCALC, TRIG, CHOLHDL, LDLDIRECT in the last 72 hours. Thyroid Function Tests: No results for input(s): TSH, T4TOTAL, FREET4, T3FREE, THYROIDAB in the last 72 hours. Anemia Panel: No results for input(s): VITAMINB12, FOLATE, FERRITIN, TIBC, IRON, RETICCTPCT in the last 72 hours. Sepsis Labs: Recent Labs  Lab 02/27/21 1657 02/27/21 2200  LATICACIDVEN 1.8 1.4    Recent Results (from the past 240 hour(s))  Resp Panel by RT-PCR (Flu A&B, Covid) Nasopharyngeal Swab     Status: None   Collection Time: 02/27/21  4:57 PM   Specimen: Nasopharyngeal Swab; Nasopharyngeal(NP) swabs in vial transport medium  Result Value Ref Range Status   SARS Coronavirus 2 by RT PCR NEGATIVE NEGATIVE Final    Comment: (NOTE) SARS-CoV-2 target nucleic acids are NOT DETECTED.  The SARS-CoV-2 RNA is generally detectable in upper respiratory specimens during the acute phase of infection. The lowest concentration of SARS-CoV-2 viral copies this assay can detect is 138 copies/mL. A negative result does not preclude SARS-Cov-2 infection and should not be used as the sole basis for treatment or other patient management decisions. A negative result may occur with  improper specimen collection/handling, submission of specimen other than nasopharyngeal swab, presence of viral mutation(s) within the areas targeted by this assay, and inadequate number of  viral copies(<138 copies/mL). A negative result must be combined with clinical observations, patient history, and epidemiological information. The expected result is Negative.  Fact Sheet for Patients:  EntrepreneurPulse.com.au  Fact Sheet for Healthcare Providers:  IncredibleEmployment.be  This test is no t yet approved or cleared by the [place] FDA and  has been authorized for detection and/or diagnosis of SARS-CoV-2 by FDA under an Emergency Use Authorization (EUA). This EUA will remain  in effect (meaning this test can be used) for the duration of the COVID-19 declaration under Section 564(b)(1) of the Act, 21 U.S.C.section 360bbb-3(b)(1), unless the authorization is terminated  or revoked sooner.       Influenza A by PCR NEGATIVE NEGATIVE Final   Influenza B by PCR NEGATIVE NEGATIVE Final    Comment: (NOTE) The Xpert Xpress SARS-CoV-2/FLU/RSV plus assay is intended as an aid in the diagnosis of  influenza from Nasopharyngeal swab specimens and should not be used as a sole basis for treatment. Nasal washings and aspirates are unacceptable for Xpert Xpress SARS-CoV-2/FLU/RSV testing.  Fact Sheet for Patients: EntrepreneurPulse.com.au  Fact Sheet for Healthcare Providers: IncredibleEmployment.be  This test is not yet approved or cleared by the [place] FDA and has been authorized for detection and/or diagnosis of SARS-CoV-2 by FDA under an Emergency Use Authorization (EUA). This EUA will remain in effect (meaning this test can be used) for the duration of the COVID-19 declaration under Section 564(b)(1) of the Act, 21 U.S.C. section 360bbb-3(b)(1), unless the authorization is terminated or revoked.  Performed at [facility], [place] [address] [place],  [address]          Radiology Studies: CT ABDOMEN PELVIS W CONTRAST  Result Date: 02/27/2021 CLINICAL DATA:  Abdominal  pain.  Incarcerated hernia suspected. EXAM: CT ABDOMEN AND PELVIS WITH CONTRAST TECHNIQUE: Multidetector CT imaging of the abdomen and pelvis was performed using the standard protocol following bolus administration of intravenous contrast. CONTRAST:  120mL OMNIPAQUE IOHEXOL 300 MG/ML  SOLN COMPARISON:  CT abdomen pelvis 02/23/2010 FINDINGS: Lower chest: No acute abnormality. Hepatobiliary: Nodular hepatic contour with an enlarged caudate lobe. Several hypodense lesions within the liver are smaller than 1 cm in too small to characterize. There is a fluid density lesion within the right hepatic lobe measuring up to 2.1 cm (3:14). Status post cholecystectomy. No biliary dilatation. Pancreas: No focal lesion. Normal pancreatic contour. No surrounding inflammatory changes. No main pancreatic ductal dilatation. Spleen: The spleen is enlarged measuring up to 14 cm. No focal abnormality. Adrenals/Urinary Tract: No adrenal nodule bilaterally. Bilateral kidneys enhance symmetrically. Subcentimeter hypodensities are too small to characterize. No hydronephrosis. No hydroureter. The urinary bladder is unremarkable. Stomach/Bowel: Proximal to mid small bowel is dilated with fluid. There is a transition point at the entrance of the small bowel into an umbilical hernia (9:83). A short segment of small bowel is noted to be dilated with fluid within the umbilical hernia with findings suggestive of a closed loop obstruction at the entry and exit sites. Associated bowel wall thickening of this short segment of bowel is noted. No definite pneumatosis. Question trace free fluid surrounding this bowel loop. The remainder of the distal small bowel is completely collapsed. Small amount of stool is noted within the colon. Scattered sigmoid diverticulosis. No large bowel dilatation or bowel wall thickening. The appendix is not definitely identified. Vascular/Lymphatic: Trace venous collateral are noted within the upper abdomen. The portal,  splenic, superior mesenteric veins are patent. No abdominal aorta or iliac aneurysm. At least moderate atherosclerotic plaque of the aorta and its branches. No abdominal, pelvic, or inguinal lymphadenopathy. Reproductive: Uterus and bilateral adnexa are unremarkable. Other: Trace simple free fluid. No intraperitoneal free gas. No organized fluid collection. Musculoskeletal: Periumbilical hernia with a short loop of small bowel noted and described above. Superiorly there is likely a tiny fat containing ventral wall hernia with an abdominal defect that is difficult to measure considering it's small size (7:63, 3:40). Inferiorly there is another tiny fat and fluid containing ventral wall hernia with an abdominal defect of 2 mm (7:70, 3:57). No suspicious lytic or blastic osseous lesions. No acute displaced fracture. Multilevel degenerative changes of the spine. IMPRESSION: 1. Small-bowel obstruction with transition point at the entry site of an umbilical hernia. Associated closed loop obstruction of a short segment of small bowel within the umbilical hernia. Concern for ischemia and incarceration of the bowel loop  within the hernia. Recommend emergent surgical consultation. 2. Cirrhosis with portal hypertension. A 2.1cm indetemriante fluid density lesion with otherwise markedly limited evaluation for focal hepatic lesion on this single phase portal venous study. Recommend MRI liver protocol for further evaluation. 3. Couple of other tiny ventral wall hernia containing fat and free fluid. 4. Scattered sigmoid diverticulosis with no acute diverticulitis. 5. Trace ascites. These results were called by telephone at the time of interpretation on 02/27/2021 at 4:29 pm to provider Dr. Nanda Quinton, who verbally acknowledged these results. Electronically Signed   By: Iven Finn M.D.   On: 02/27/2021 16:38   DG Abd Portable 1V  Result Date: 02/28/2021 CLINICAL DATA:  NG tube placement EXAM: PORTABLE ABDOMEN - 1 VIEW  COMPARISON:  None. FINDINGS: Tip and side port of the esophageal catheter project within the stomach. IMPRESSION: Esophageal catheter tip in the stomach. Electronically Signed   By: Ulyses Jarred M.D.   On: 02/28/2021 01:40        Scheduled Meds: . chlorhexidine  15 mL Mouth/Throat NOW  . pantoprazole (PROTONIX) IV  40 mg Intravenous Q12H   Continuous Infusions: . dextrose 5 % and 0.45% NaCl 100 mL/hr at 02/27/21 2254  . lactated ringers       LOS: 1 day    Time spent: 35 minutes    Kathie Dike, MD [facility]   If 7PM-7AM, please contact night-coverage [email or web address]  02/28/2021, 2:56 PM

## 2021-03-01 ENCOUNTER — Inpatient Hospital Stay (HOSPITAL_COMMUNITY): Payer: Medicaid Other

## 2021-03-01 DIAGNOSIS — R1033 Periumbilical pain: Secondary | ICD-10-CM | POA: Diagnosis not present

## 2021-03-01 DIAGNOSIS — K56609 Unspecified intestinal obstruction, unspecified as to partial versus complete obstruction: Secondary | ICD-10-CM | POA: Diagnosis not present

## 2021-03-01 DIAGNOSIS — K7689 Other specified diseases of liver: Secondary | ICD-10-CM | POA: Diagnosis not present

## 2021-03-01 DIAGNOSIS — N179 Acute kidney failure, unspecified: Secondary | ICD-10-CM | POA: Diagnosis not present

## 2021-03-01 DIAGNOSIS — K7460 Unspecified cirrhosis of liver: Secondary | ICD-10-CM | POA: Diagnosis not present

## 2021-03-01 DIAGNOSIS — K420 Umbilical hernia with obstruction, without gangrene: Secondary | ICD-10-CM | POA: Diagnosis not present

## 2021-03-01 DIAGNOSIS — K766 Portal hypertension: Secondary | ICD-10-CM | POA: Diagnosis not present

## 2021-03-01 DIAGNOSIS — K7469 Other cirrhosis of liver: Secondary | ICD-10-CM | POA: Diagnosis not present

## 2021-03-01 DIAGNOSIS — I878 Other specified disorders of veins: Secondary | ICD-10-CM | POA: Diagnosis not present

## 2021-03-01 LAB — BASIC METABOLIC PANEL
Anion gap: 3 — ABNORMAL LOW (ref 5–15)
BUN: 24 mg/dL — ABNORMAL HIGH (ref 6–20)
CO2: 25 mmol/L (ref 22–32)
Calcium: 10.1 mg/dL (ref 8.9–10.3)
Chloride: 106 mmol/L (ref 98–111)
Creatinine, Ser: 0.97 mg/dL (ref 0.44–1.00)
GFR, Estimated: >60 mL/min (ref >60)
Glucose, Bld: 104 mg/dL — ABNORMAL HIGH (ref 70–99)
Potassium: 3.8 mmol/L (ref 3.5–5.1)
Sodium: 134 mmol/L — ABNORMAL LOW (ref 135–145)

## 2021-03-01 LAB — CBC
HCT: 29.9 % — ABNORMAL LOW (ref 36.0–46.0)
Hemoglobin: 10.4 g/dL — ABNORMAL LOW (ref 12.0–15.0)
MCH: 33.5 pg (ref 26.0–34.0)
MCHC: 34.8 g/dL (ref 30.0–36.0)
MCV: 96.5 fL (ref 80.0–100.0)
Platelets: 97 10*3/uL — ABNORMAL LOW (ref 150–400)
RBC: 3.1 MIL/uL — ABNORMAL LOW (ref 3.87–5.11)
RDW: 14.9 % (ref 11.5–15.5)
WBC: 9.9 10*3/uL (ref 4.0–10.5)
nRBC: 0 % (ref 0.0–0.2)

## 2021-03-01 LAB — HEPATITIS PANEL, ACUTE
HCV Ab: REACTIVE — AB
Hep A IgM: NONREACTIVE
Hep B C IgM: NONREACTIVE
Hepatitis B Surface Ag: NONREACTIVE

## 2021-03-01 IMAGING — MR MR ABDOMEN WO/W CM
18 series · 48 of 48 positions shown · IV contrast (gadavist)
Comparison: CTs of [DATE] and [DATE].

CLINICAL DATA: Cirrhosis.  Indeterminate liver lesion on CT.

EXAM:
MRI ABDOMEN WITHOUT AND WITH CONTRAST
TECHNIQUE: Multiplanar multisequence MR imaging of the abdomen was performed
both before and after the administration of intravenous contrast.
CONTRAST:  7.5mL GADAVIST GADOBUTROL 1 MMOL/ML IV SOLN

[Series 3: cor haste · coronal · 6.0mm · 1.19mm/px · 2 of 34 slices shown]
[im 1/34]
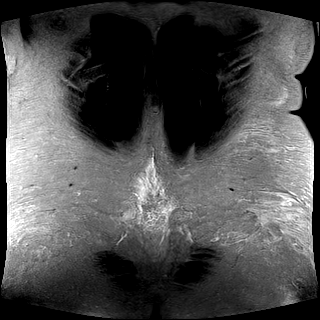
[im 34/34]
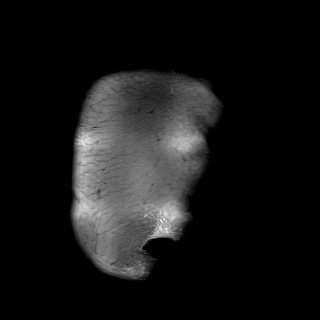

[Series 4: ax haste · axial · 6.0mm · 1.19mm/px · z∈[-211,+12]mm · 2 of 32 slices shown]
[im 1/32]
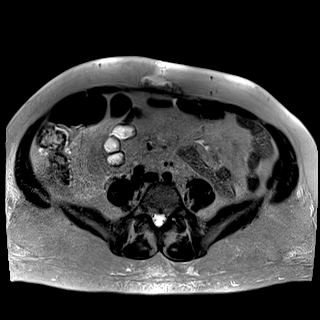
[im 32/32]
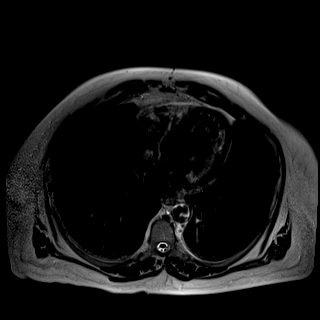

[Series 5: t1_vibe_opp-in_tra_p4_bh · axial · 3.0mm · 1.19mm/px · z∈[-206,+7]mm · 4 of 72 slices shown (1 of 2)]
[im 1/72]
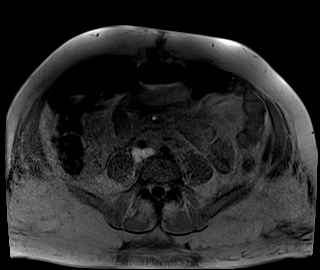
[im 24/72]
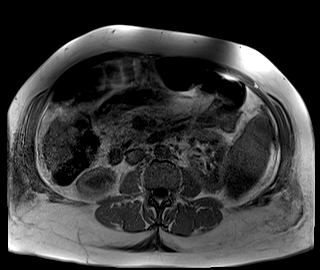
[im 48/72]
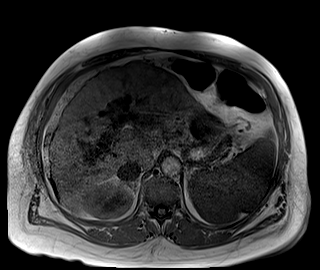
[im 72/72]
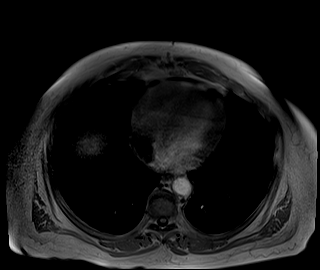

[Series 5: t1_vibe_opp-in_tra_p4_bh · axial · 3.0mm · 1.19mm/px · z∈[-206,+7]mm · 3 of 72 slices shown (2 of 2)]
[im 1/72]
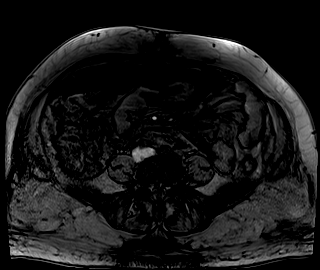
[im 36/72]
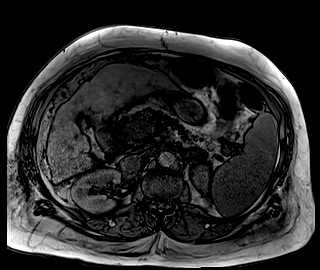
[im 72/72]
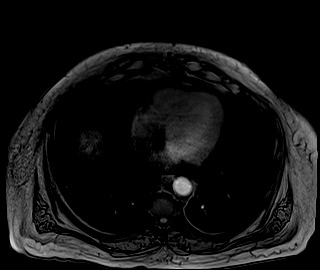

[Series 8: T2 fat-sat · axial · 6.0mm · 1.19mm/px · 1 of 30 slices shown]
[im 1/30]
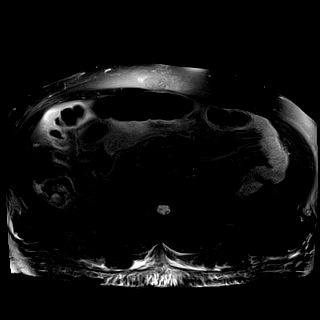

[Series 9: DWI · axial · 6.0mm · 1.42mm/px · z∈[-184,+24]mm · 4 of 90 slices shown (1 of 2)]
[im 1/90]
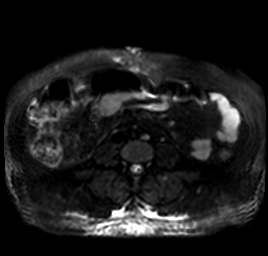
[im 30/90]
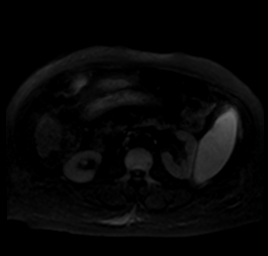
[im 60/90]
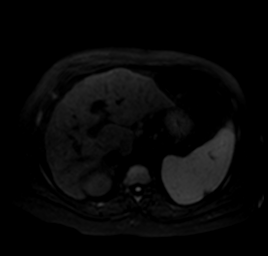
[im 90/90]
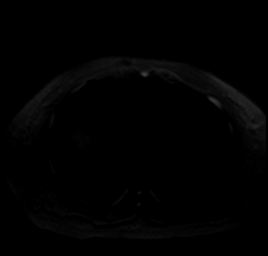

[Series 10: DWI · axial · 6.0mm · 1.42mm/px · 1 of 30 slices shown (2 of 2)]
[im 1/30]
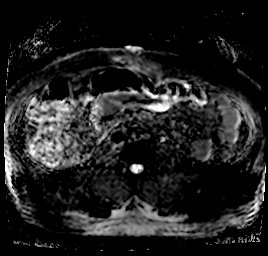

[Series 11: bSSFP · axial · 6.0mm · 0.74mm/px · 1 of 30 slices shown]
[im 1/30]
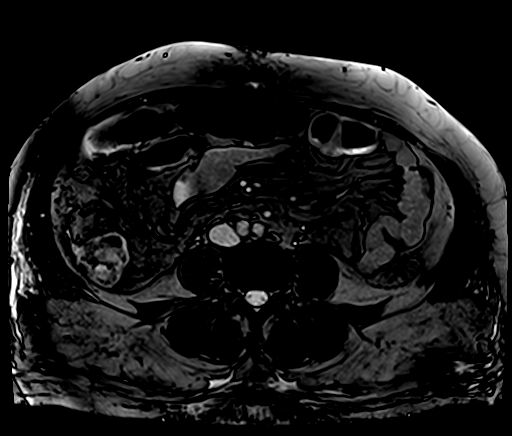

[Series 12: t1_vibe_fs_tra_p4_bh_pre · axial · 3.0mm · 1.19mm/px · z∈[-198,+15]mm · 3 of 72 slices shown]
[im 1/72]
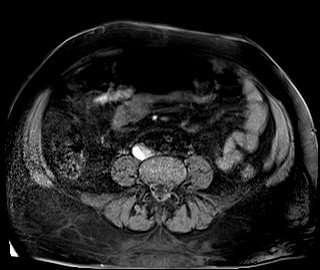
[im 36/72]
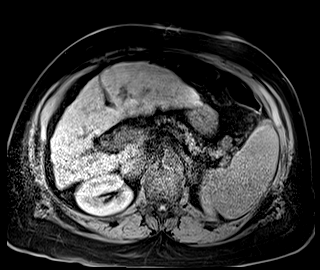
[im 72/72]
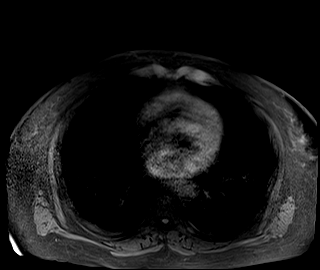

[Series 15: t1_vibe_fs_tra_p4_bh_post · axial · 3.0mm · 1.19mm/px · z∈[-198,+15]mm · 3 of 72 slices shown (1 of 4)]
[im 1/72]
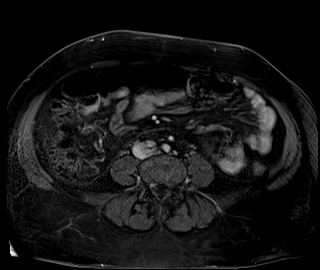
[im 36/72]
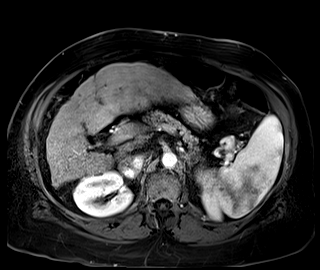
[im 72/72]
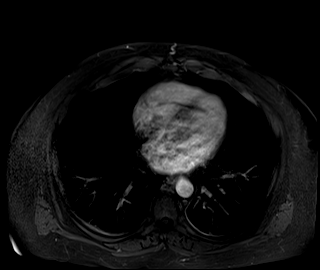

[Series 16: t1_vibe_fs_tra_p4_bh_post_sub · axial · 3.0mm · 1.19mm/px · z∈[-198,+15]mm · 3 of 72 slices shown (1 of 4)]
[im 1/72]
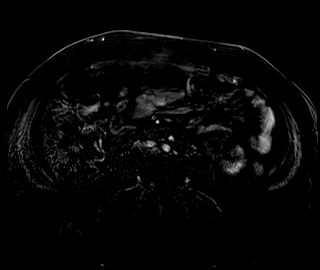
[im 36/72]
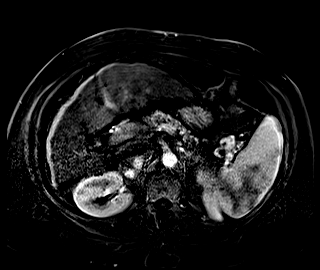
[im 72/72]
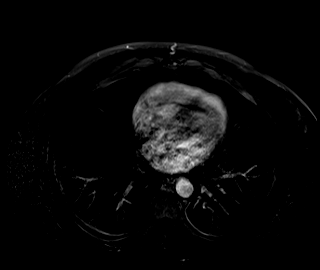

[Series 17: t1_vibe_fs_tra_p4_bh_post · axial · 3.0mm · 1.19mm/px · z∈[-198,+15]mm · 3 of 72 slices shown (2 of 4)]
[im 1/72]
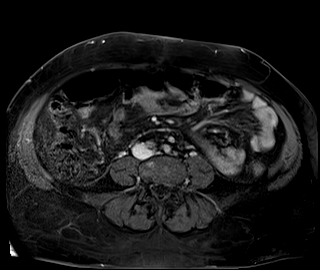
[im 36/72]
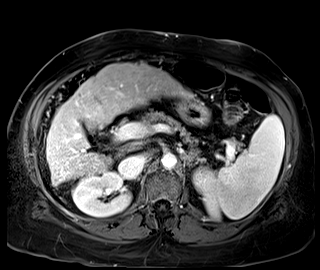
[im 72/72]
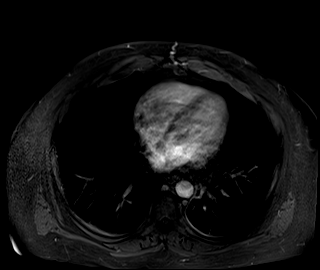

[Series 18: t1_vibe_fs_tra_p4_bh_post_sub · axial · 3.0mm · 1.19mm/px · z∈[-198,+15]mm · 3 of 72 slices shown (2 of 4)]
[im 1/72]
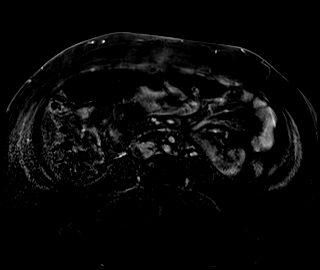
[im 36/72]
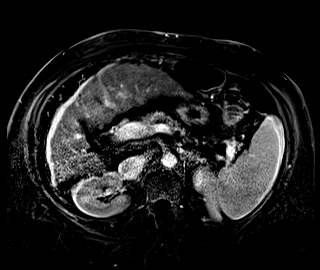
[im 72/72]
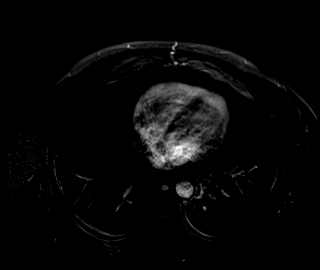

[Series 19: t1_vibe_fs_tra_p4_bh_post · axial · 3.0mm · 1.19mm/px · z∈[-198,+15]mm · 3 of 72 slices shown (3 of 4)]
[im 1/72]
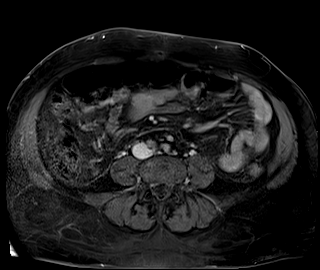
[im 36/72]
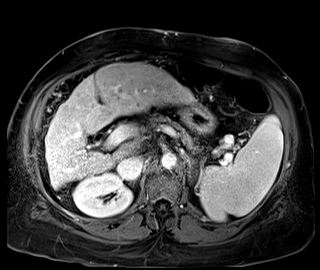
[im 72/72]
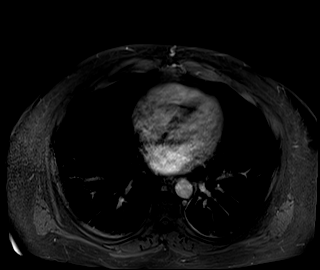

[Series 20: t1_vibe_fs_tra_p4_bh_post_sub · axial · 3.0mm · 1.19mm/px · z∈[-198,+15]mm · 3 of 72 slices shown (3 of 4)]
[im 1/72]
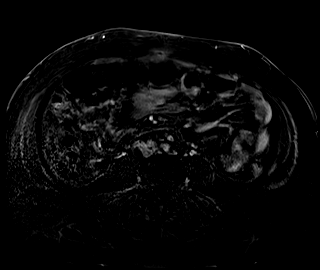
[im 36/72]
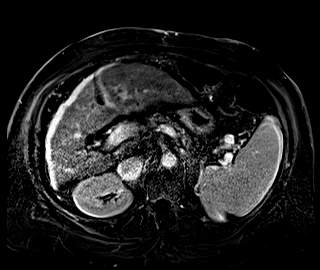
[im 72/72]
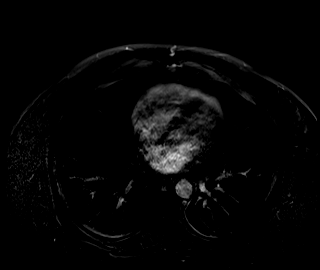

[Series 21: t1_vibe_fs_tra_p4_bh_post · axial · 3.0mm · 1.19mm/px · z∈[-198,+15]mm · 3 of 72 slices shown (4 of 4)]
[im 1/72]
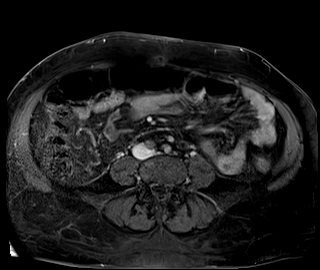
[im 36/72]
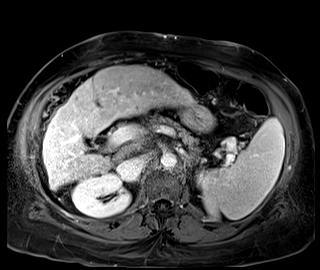
[im 72/72]
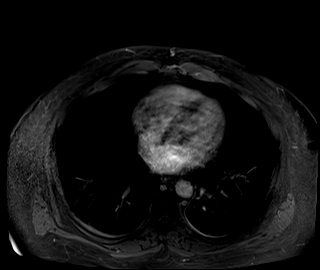

[Series 22: t1_vibe_fs_tra_p4_bh_post_sub · axial · 3.0mm · 1.19mm/px · z∈[-198,+15]mm · 3 of 72 slices shown (4 of 4)]
[im 1/72]
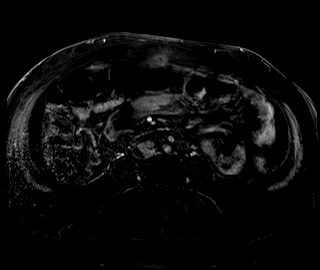
[im 36/72]
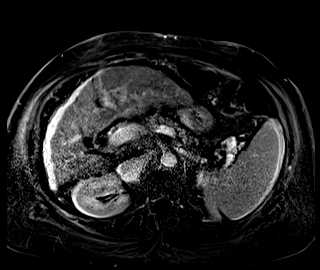
[im 72/72]
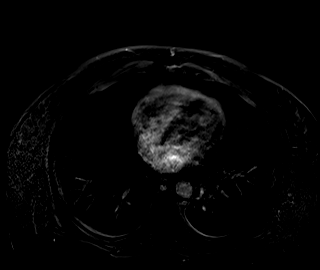

[Series 23: T1 dynamic post-contrast · coronal · 3.0mm · 1.31mm/px · 3 of 72 slices shown]
[im 1/72]
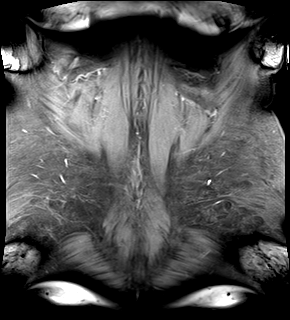
[im 36/72]
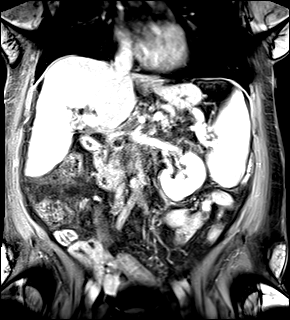
[im 72/72]
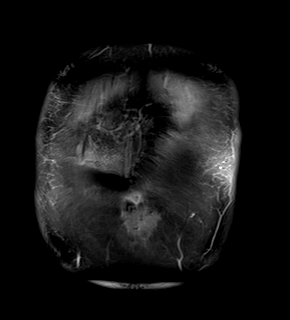

[48 of 48 positions shown; findings below may reference images not displayed]

FINDINGS: Lower chest: Normal heart size without pericardial or pleural
effusion.

Hepatobiliary: Marked cirrhosis. Multiple foci of hyperenhancement
on series 16. These measure on the order of maximally 6 mm in the
hepatic dome on 15/15. None demonstrate delayed washout or other
suspicious characteristics.

Corresponding to the CT abnormality is a simple cyst within the
central left hepatic lobe 1.8 cm. Other smaller hepatic cysts are
seen.

Intrahepatic ducts are upper normal for prior cholecystectomy. There
may also be peribiliary cysts. No common duct dilatation or
choledocholithiasis.

Pancreas:  Normal, without mass or ductal dilatation.

Spleen:  Borderline splenomegaly at 13.7 cm craniocaudal.

Adrenals/Urinary Tract: Normal adrenal glands. Bilateral tiny renal
cysts. No hydronephrosis.

Stomach/Bowel: Normal stomach and abdominal bowel loops.

Vascular/Lymphatic: Aortic atherosclerosis. Patent portal splenic
and hepatic veins. Enlarged portal vein, suggesting portal venous
hypertension.

No retroperitoneal or retrocrural adenopathy.

Other:  No ascites.

Musculoskeletal: No acute osseous abnormality.
IMPRESSION: 1. Cirrhosis and portal venous hypertension.
2. No evidence of hepatocellular carcinoma. Multiple arterial phase
foci of hyperenhancement are likely perfusion anomalies. LR 3. This
could be re-evaluated with follow-up MRI at 6 months.
3.  Aortic Atherosclerosis ([4D]-[4D]).

## 2021-03-01 MED ORDER — GADOBUTROL 1 MMOL/ML IV SOLN
7.5000 mL | Freq: Once | INTRAVENOUS | Status: AC | PRN
  Administered 2021-03-01: 7.5 mL via INTRAVENOUS

## 2021-03-01 MED ORDER — ACETAMINOPHEN 500 MG PO TABS
500.0000 mg | ORAL_TABLET | Freq: Four times a day (QID) | ORAL | Status: DC | PRN
Start: 2021-03-01 — End: 2021-03-03

## 2021-03-01 MED ORDER — TRAMADOL HCL 50 MG PO TABS
50.0000 mg | ORAL_TABLET | Freq: Four times a day (QID) | ORAL | Status: DC | PRN
  Administered 2021-03-02: 50 mg via ORAL
  Filled 2021-03-01: qty 1

## 2021-03-01 NOTE — Evaluation (Signed)
Occupational Therapy Evaluation Patient Details Name: Samantha Evans MRN: [id number] DOB: [birth year] Today's Date: 03/01/2021    History of Present Illness Patient is a 60 y/o female who presents on 02/27/21 with N/V and pain from umbilical hernia. Abdominal CT-umbilical hernia w/ secondary SBO and signs of inflammation now s/p repair of incarcerated umbilical hernia 7/61/95. No PMH.   Clinical Impression   Patient admitted for the diagnosis and procedure above.  PTA she lived with her sin in a mobile home.  Family will be able to provide assist as needed at home.  Barriers are listed below.  Currently she is needing up to supervision for lower body ADL and is essentially Mod I for basic mobility in her room.  Patient has been walking in the halls with a RW and staff.  She had just completed three laps around the unit, and was fatigued.  She remains a little unsteady without the RW, reaching for objects in her environment.  OT to follow iin the acute setting to maximize her functional status for an eventual return home.      Follow Up Recommendations  No OT follow up    Equipment Recommendations  None recommended by OT    Recommendations for Other Services       Precautions / Restrictions Precautions Precautions: Other (comment);Fall Required Braces or Orthoses: Other Brace Other Brace: abdominal binder for OOB Restrictions Weight Bearing Restrictions: No      Mobility Bed Mobility Overal bed mobility: Modified Independent               Patient Response: Cooperative  Transfers Overall transfer level: Needs assistance Equipment used: Rolling walker (2 wheeled) Transfers: Sit to/from Stand Sit to Stand: Supervision              Balance Overall balance assessment: Needs assistance Sitting-balance support: Feet supported;No upper extremity supported Sitting balance-Leahy Scale: Good     Standing balance support: Bilateral upper extremity supported Standing  balance-Leahy Scale: Poor Standing balance comment: like RW to take stress off her abdomen                           ADL either performed or assessed with clinical judgement   ADL Overall ADL's : Needs assistance/impaired Eating/Feeding: Independent;Sitting   Grooming: Wash/dry hands;Wash/dry face;Independent;Sitting   Upper Body Bathing: Set up;Sitting   Lower Body Bathing: Supervison/ safety;Sit to/from stand   Upper Body Dressing : Set up;Sitting   Lower Body Dressing: Supervision/safety;Sit to/from stand               Functional mobility during ADLs: Modified independent;Rolling walker       Vision Patient Visual Report: No change from baseline       Perception     Praxis      Pertinent Vitals/Pain Pain Assessment: Faces Faces Pain Scale: Hurts a little bit Pain Location: abdomen Pain Descriptors / Indicators: Sore Pain Intervention(s): Monitored during session     Hand Dominance Right   Extremity/Trunk Assessment Upper Extremity Assessment Upper Extremity Assessment: Overall WFL for tasks assessed   Lower Extremity Assessment Lower Extremity Assessment: Defer to PT evaluation   Cervical / Trunk Assessment Cervical / Trunk Assessment: Normal   Communication Communication Communication: No difficulties   Cognition Arousal/Alertness: Awake/alert Behavior During Therapy: WFL for tasks assessed/performed Overall Cognitive Status: Within Functional Limits for tasks assessed  General Comments       Exercises     Shoulder Instructions      Home Living Family/patient expects to be discharged to:: Private residence Living Arrangements: Children Available Help at Discharge: Family;Available PRN/intermittently Type of Home: Mobile home Home Access: Stairs to enter Entrance Stairs-Number of Steps: 2-3 Entrance Stairs-Rails: Right Home Layout: One level     Bathroom Shower/Tub:  Occupational psychologist: Standard     Home Equipment: None          Prior Functioning/Environment Level of Independence: Independent        Comments: Drives, doing ADLs/IADLs.        OT Problem List: Impaired balance (sitting and/or standing);Pain      OT Treatment/Interventions: Self-care/ADL training;Therapeutic activities;Balance training    OT Goals(Current goals can be found in the care plan section) Acute Rehab OT Goals Patient Stated Goal: keep moving and feel better OT Goal Formulation: With patient Time For Goal Achievement: 03/15/21 Potential to Achieve Goals: Good ADL Goals Pt Will Perform Lower Body Bathing: Independently;sit to/from stand Pt Will Perform Lower Body Dressing: Independently;sit to/from stand Pt Will Transfer to Toilet: Independently;ambulating;regular height toilet Pt Will Perform Toileting - Clothing Manipulation and hygiene: Independently;sit to/from stand  OT Frequency: Min 2X/week   Barriers to D/C:    none noted       Co-evaluation              AM-PAC OT "6 Clicks" Daily Activity     Outcome Measure Help from another person eating meals?: None Help from another person taking care of personal grooming?: None Help from another person toileting, which includes using toliet, bedpan, or urinal?: None Help from another person bathing (including washing, rinsing, drying)?: A Little Help from another person to put on and taking off regular upper body clothing?: None Help from another person to put on and taking off regular lower body clothing?: A Little 6 Click Score: 22   End of Session Equipment Utilized During Treatment: Rolling walker Nurse Communication: Mobility status  Activity Tolerance: Patient tolerated treatment well Patient left: in bed;with call bell/phone within reach  OT Visit Diagnosis: Unsteadiness on feet (R26.81);Pain                Time: 1610-9604 OT Time Calculation (min): 17 min Charges:  OT  General Charges $OT Visit: 1 Visit OT Evaluation $OT Eval Moderate Complexity: 1 Mod  03/01/2021  Rich, OTR/L  Acute Rehabilitation Services  Office:  [phone]   Metta Clines 03/01/2021, 4:00 PM

## 2021-03-01 NOTE — Progress Notes (Signed)
PROGRESS NOTE    Samantha Evans  BJS:[id number] DOB: [birth year] DOA: 02/27/2021 PCP: Pcp, No    Brief Narrative:  60 year old female admitted to the hospital with nausea, vomiting and was found to have incarcerated umbilical hernia with small bowel obstruction.  She was admitted for operative management.  Incidental finding of underlying cirrhosis noted on CT imaging.   Assessment & Plan:   Active Problems:   Abdominal pain   Incarcerated umbilical hernia   Small bowel obstruction (HCC)   Hepatic cirrhosis ([place])- by CT scan   AKI (acute kidney injury) ([place])   Small bowel obstruction due to incarcerated umbilical hernia -Status post operative repair on 4/14 -NG tube removed 4/15 -currently on clear liquids -no BM yet -Await return of bowel function -Postop care per general surgery  Cirrhosis -No significant alcohol history -Viral panel positive for HCV ab, will check quantitative RNA -We will likely need to follow-up with GI as an outpatient for further work-up and management -Noted to have 2.1 cm fluid density in liver on CT, This was not appreciated on MRI  Thrombocytopenia -Suspect this is related to underlying liver disease -No signs of bleeding at present -Continue to monitor    DVT prophylaxis: SCDs Start: 02/27/21 2159  Code Status: Full code Family Communication: Discussed with patient Disposition Plan: Status is: Inpatient  Remains inpatient appropriate because:Inpatient level of care appropriate due to severity of illness   Dispo: The patient is from: Home              Anticipated d/c is to: Home              Patient currently is not medically stable to d/c.   Difficult to place patient No         Consultants:   General surgery  Procedures:   Repair of incarcerated umbilical hernia  Antimicrobials:      Subjective: No bowel movements/flatus, no vomiting, tolerating clear liquids  Objective: Vitals:   02/28/21 1806 02/28/21 2127  03/01/21 0400 03/01/21 1211  BP: (!) 145/115 (!) 152/62 (!) 145/65 114/79  Pulse: (!) 52 76  74  Resp: 16 17 18 18   Temp: 99.1 F (37.3 C) 98.6 F (37 C) 98.2 F (36.8 C) 98.6 F (37 C)  TempSrc:  Oral Oral Oral  SpO2: 91% 95% 94% 90%  Weight:      Height:        Intake/Output Summary (Last 24 hours) at 03/01/2021 1614 Last data filed at 03/01/2021 0500 Gross per 24 hour  Intake 0 ml  Output 2 ml  Net -2 ml   Filed Weights   02/27/21 1346  Weight: 81.6 kg    Examination:  General exam: Alert, awake, oriented x 3 Respiratory system: Clear to auscultation. Respiratory effort normal. Cardiovascular system:RRR. No murmurs, rubs, gallops. Gastrointestinal system: Abdomen is nondistended, soft. No organomegaly or masses felt. Normal bowel sounds heard. Central nervous system: Alert and oriented. No focal neurological deficits. Extremities: No C/C/E, +pedal pulses Skin: No rashes, lesions or ulcers Psychiatry: Judgement and insight appear normal. Mood & affect appropriate.      Data Reviewed: I have personally reviewed following labs and imaging studies  CBC: Recent Labs  Lab 02/23/21 1019 02/27/21 1345 02/28/21 0128 03/01/21 0042  WBC 3.6* 6.2 6.0 9.9  NEUTROABS  --  4.5  --   --   HGB 11.3* 12.6 11.2* 10.4*  HCT 32.9* 36.5 32.7* 29.9*  MCV 96.5 96.1 97.6 96.5  PLT 93* 135*  88* 97*   Basic Metabolic Panel: Recent Labs  Lab 02/23/21 1019 02/27/21 1345 02/28/21 0128 03/01/21 0042  NA 138 136 133* 134*  K 3.7 4.1 4.1 3.8  CL 109 106 108 106  CO2 26 22 21* 25  GLUCOSE 127* 107* 152* 104*  BUN 17 25* 25* 24*  CREATININE 0.80 1.18* 1.06* 0.97  CALCIUM 10.1 10.4* 9.8 10.1   GFR: Estimated Creatinine Clearance: 59.7 mL/min (by C-G formula based on SCr of 0.97 mg/dL). Liver Function Tests: Recent Labs  Lab 02/23/21 1019 02/27/21 1345 02/28/21 0128  AST 159* 105* 81*  ALT 92* 72* 63*  ALKPHOS 124 94 89  BILITOT 1.8* 2.8* 1.5*  PROT 7.0 7.3 6.9   ALBUMIN 2.7* 3.0* 2.6*   Recent Labs  Lab 02/23/21 1019 02/27/21 1345  LIPASE 42 37   No results for input(s): AMMONIA in the last 168 hours. Coagulation Profile: Recent Labs  Lab 02/27/21 1900  INR 1.3*   Cardiac Enzymes: No results for input(s): CKTOTAL, CKMB, CKMBINDEX, TROPONINI in the last 168 hours. BNP (last 3 results) No results for input(s): PROBNP in the last 8760 hours. HbA1C: No results for input(s): HGBA1C in the last 72 hours. CBG: No results for input(s): GLUCAP in the last 168 hours. Lipid Profile: No results for input(s): CHOL, HDL, LDLCALC, TRIG, CHOLHDL, LDLDIRECT in the last 72 hours. Thyroid Function Tests: No results for input(s): TSH, T4TOTAL, FREET4, T3FREE, THYROIDAB in the last 72 hours. Anemia Panel: No results for input(s): VITAMINB12, FOLATE, FERRITIN, TIBC, IRON, RETICCTPCT in the last 72 hours. Sepsis Labs: Recent Labs  Lab 02/27/21 1657 02/27/21 2200  LATICACIDVEN 1.8 1.4    Recent Results (from the past 240 hour(s))  Resp Panel by RT-PCR (Flu A&B, Covid) Nasopharyngeal Swab     Status: None   Collection Time: 02/27/21  4:57 PM   Specimen: Nasopharyngeal Swab; Nasopharyngeal(NP) swabs in vial transport medium  Result Value Ref Range Status   SARS Coronavirus 2 by RT PCR NEGATIVE NEGATIVE Final    Comment: (NOTE) SARS-CoV-2 target nucleic acids are NOT DETECTED.  The SARS-CoV-2 RNA is generally detectable in upper respiratory specimens during the acute phase of infection. The lowest concentration of SARS-CoV-2 viral copies this assay can detect is 138 copies/mL. A negative result does not preclude SARS-Cov-2 infection and should not be used as the sole basis for treatment or other patient management decisions. A negative result may occur with  improper specimen collection/handling, submission of specimen other than nasopharyngeal swab, presence of viral mutation(s) within the areas targeted by this assay, and inadequate number of  viral copies(<138 copies/mL). A negative result must be combined with clinical observations, patient history, and epidemiological information. The expected result is Negative.  Fact Sheet for Patients:  EntrepreneurPulse.com.au  Fact Sheet for Healthcare Providers:  IncredibleEmployment.be  This test is no t yet approved or cleared by the [place] FDA and  has been authorized for detection and/or diagnosis of SARS-CoV-2 by FDA under an Emergency Use Authorization (EUA). This EUA will remain  in effect (meaning this test can be used) for the duration of the COVID-19 declaration under Section 564(b)(1) of the Act, 21 U.S.C.section 360bbb-3(b)(1), unless the authorization is terminated  or revoked sooner.       Influenza A by PCR NEGATIVE NEGATIVE Final   Influenza B by PCR NEGATIVE NEGATIVE Final    Comment: (NOTE) The Xpert Xpress SARS-CoV-2/FLU/RSV plus assay is intended as an aid in the diagnosis of influenza from Nasopharyngeal swab  specimens and should not be used as a sole basis for treatment. Nasal washings and aspirates are unacceptable for Xpert Xpress SARS-CoV-2/FLU/RSV testing.  Fact Sheet for Patients: EntrepreneurPulse.com.au  Fact Sheet for Healthcare Providers: IncredibleEmployment.be  This test is not yet approved or cleared by the [place] FDA and has been authorized for detection and/or diagnosis of SARS-CoV-2 by FDA under an Emergency Use Authorization (EUA). This EUA will remain in effect (meaning this test can be used) for the duration of the COVID-19 declaration under Section 564(b)(1) of the Act, 21 U.S.C. section 360bbb-3(b)(1), unless the authorization is terminated or revoked.  Performed at [facility], [place] [address] [place], [place] [address]          Radiology Studies: CT ABDOMEN PELVIS W CONTRAST  Result Date: 02/27/2021 CLINICAL DATA:  Abdominal  pain.  Incarcerated hernia suspected. EXAM: CT ABDOMEN AND PELVIS WITH CONTRAST TECHNIQUE: Multidetector CT imaging of the abdomen and pelvis was performed using the standard protocol following bolus administration of intravenous contrast. CONTRAST:  143mL OMNIPAQUE IOHEXOL 300 MG/ML  SOLN COMPARISON:  CT abdomen pelvis 02/23/2010 FINDINGS: Lower chest: No acute abnormality. Hepatobiliary: Nodular hepatic contour with an enlarged caudate lobe. Several hypodense lesions within the liver are smaller than 1 cm in too small to characterize. There is a fluid density lesion within the right hepatic lobe measuring up to 2.1 cm (3:14). Status post cholecystectomy. No biliary dilatation. Pancreas: No focal lesion. Normal pancreatic contour. No surrounding inflammatory changes. No main pancreatic ductal dilatation. Spleen: The spleen is enlarged measuring up to 14 cm. No focal abnormality. Adrenals/Urinary Tract: No adrenal nodule bilaterally. Bilateral kidneys enhance symmetrically. Subcentimeter hypodensities are too small to characterize. No hydronephrosis. No hydroureter. The urinary bladder is unremarkable. Stomach/Bowel: Proximal to mid small bowel is dilated with fluid. There is a transition point at the entrance of the small bowel into an umbilical hernia (8:01). A short segment of small bowel is noted to be dilated with fluid within the umbilical hernia with findings suggestive of a closed loop obstruction at the entry and exit sites. Associated bowel wall thickening of this short segment of bowel is noted. No definite pneumatosis. Question trace free fluid surrounding this bowel loop. The remainder of the distal small bowel is completely collapsed. Small amount of stool is noted within the colon. Scattered sigmoid diverticulosis. No large bowel dilatation or bowel wall thickening. The appendix is not definitely identified. Vascular/Lymphatic: Trace venous collateral are noted within the upper abdomen. The portal,  splenic, superior mesenteric veins are patent. No abdominal aorta or iliac aneurysm. At least moderate atherosclerotic plaque of the aorta and its branches. No abdominal, pelvic, or inguinal lymphadenopathy. Reproductive: Uterus and bilateral adnexa are unremarkable. Other: Trace simple free fluid. No intraperitoneal free gas. No organized fluid collection. Musculoskeletal: Periumbilical hernia with a short loop of small bowel noted and described above. Superiorly there is likely a tiny fat containing ventral wall hernia with an abdominal defect that is difficult to measure considering it's small size (7:63, 3:40). Inferiorly there is another tiny fat and fluid containing ventral wall hernia with an abdominal defect of 2 mm (7:70, 3:57). No suspicious lytic or blastic osseous lesions. No acute displaced fracture. Multilevel degenerative changes of the spine. IMPRESSION: 1. Small-bowel obstruction with transition point at the entry site of an umbilical hernia. Associated closed loop obstruction of a short segment of small bowel within the umbilical hernia. Concern for ischemia and incarceration of the bowel loop within the hernia. Recommend  emergent surgical consultation. 2. Cirrhosis with portal hypertension. A 2.1cm indetemriante fluid density lesion with otherwise markedly limited evaluation for focal hepatic lesion on this single phase portal venous study. Recommend MRI liver protocol for further evaluation. 3. Couple of other tiny ventral wall hernia containing fat and free fluid. 4. Scattered sigmoid diverticulosis with no acute diverticulitis. 5. Trace ascites. These results were called by telephone at the time of interpretation on 02/27/2021 at 4:29 pm to provider Dr. Nanda Quinton, who verbally acknowledged these results. Electronically Signed   By: Iven Finn M.D.   On: 02/27/2021 16:38   MR LIVER W WO CONTRAST  Result Date: 03/01/2021 CLINICAL DATA:  Cirrhosis.  Indeterminate liver lesion on CT.  EXAM: MRI ABDOMEN WITHOUT AND WITH CONTRAST TECHNIQUE: Multiplanar multisequence MR imaging of the abdomen was performed both before and after the administration of intravenous contrast. CONTRAST:  7.9mL GADAVIST GADOBUTROL 1 MMOL/ML IV SOLN COMPARISON:  CTs of 02/27/2021 and 02/23/2010. FINDINGS: Lower chest: Normal heart size without pericardial or pleural effusion. Hepatobiliary: Marked cirrhosis. Multiple foci of hyperenhancement on series 16. These measure on the order of maximally 6 mm in the hepatic dome on 15/15. None demonstrate delayed washout or other suspicious characteristics. Corresponding to the CT abnormality is a simple cyst within the central left hepatic lobe 1.8 cm. Other smaller hepatic cysts are seen. Intrahepatic ducts are upper normal for prior cholecystectomy. There may also be peribiliary cysts. No common duct dilatation or choledocholithiasis. Pancreas:  Normal, without mass or ductal dilatation. Spleen:  Borderline splenomegaly at 13.7 cm craniocaudal. Adrenals/Urinary Tract: Normal adrenal glands. Bilateral tiny renal cysts. No hydronephrosis. Stomach/Bowel: Normal stomach and abdominal bowel loops. Vascular/Lymphatic: Aortic atherosclerosis. Patent portal splenic and hepatic veins. Enlarged portal vein, suggesting portal venous hypertension. No retroperitoneal or retrocrural adenopathy. Other:  No ascites. Musculoskeletal: No acute osseous abnormality. IMPRESSION: 1. Cirrhosis and portal venous hypertension. 2. No evidence of hepatocellular carcinoma. Multiple arterial phase foci of hyperenhancement are likely perfusion anomalies. LR 3. This could be re-evaluated with follow-up MRI at 6 months. 3.  Aortic Atherosclerosis (ICD10-I70.0). Electronically Signed   By: Abigail Miyamoto M.D.   On: 03/01/2021 12:49   DG Abd Portable 1V  Result Date: 02/28/2021 CLINICAL DATA:  NG tube placement EXAM: PORTABLE ABDOMEN - 1 VIEW COMPARISON:  None. FINDINGS: Tip and side port of the esophageal  catheter project within the stomach. IMPRESSION: Esophageal catheter tip in the stomach. Electronically Signed   By: Ulyses Jarred M.D.   On: 02/28/2021 01:40        Scheduled Meds: . pantoprazole (PROTONIX) IV  40 mg Intravenous Q12H   Continuous Infusions: . lactated ringers       LOS: 2 days    Time spent: 35 minutes    Kathie Dike, MD [facility]   If 7PM-7AM, please contact night-coverage [email or web address]  03/01/2021, 4:14 PM

## 2021-03-01 NOTE — Progress Notes (Signed)
Progress Note  2 Days Post-Op  Subjective: Patient reports some soreness in low abdomen with mobilizing. Denies nausea or vomiting or pain with CLD. No flatus or BM. Ambulating well.   Objective: Vital signs in last 24 hours: Temp:  [98.2 F (36.8 C)-99.1 F (37.3 C)] 98.2 F (36.8 C) (04/16 0400) Pulse Rate:  [52-76] 76 (04/15 2127) Resp:  [16-18] 18 (04/16 0400) BP: (125-152)/(62-115) 145/65 (04/16 0400) SpO2:  [89 %-95 %] 94 % (04/16 0400)    Intake/Output from previous day: 04/15 0701 - 04/16 0700 In: 580 [P.O.:580] Out: 2 [Urine:2] Intake/Output this shift: No intake/output data recorded.  PE: General: pleasant, WD, obese female who is laying in bed in NAD Heart: regular, rate, and rhythm.  Lungs: CTAB, no wheezes, rhonchi, or rales noted.  Respiratory effort nonlabored Abd: soft, appropriately ttp, ND, +BS, incision c/d/i   Lab Results:  Recent Labs    02/28/21 0128 03/01/21 0042  WBC 6.0 9.9  HGB 11.2* 10.4*  HCT 32.7* 29.9*  PLT 88* 97*   BMET Recent Labs    02/28/21 0128 03/01/21 0042  NA 133* 134*  K 4.1 3.8  CL 108 106  CO2 21* 25  GLUCOSE 152* 104*  BUN 25* 24*  CREATININE 1.06* 0.97  CALCIUM 9.8 10.1   PT/INR Recent Labs    02/27/21 1900  LABPROT 16.0*  INR 1.3*   CMP     Component Value Date/Time   NA 134 (L) 03/01/2021 0042   K 3.8 03/01/2021 0042   CL 106 03/01/2021 0042   CO2 25 03/01/2021 0042   GLUCOSE 104 (H) 03/01/2021 0042   BUN 24 (H) 03/01/2021 0042   CREATININE 0.97 03/01/2021 0042   CALCIUM 10.1 03/01/2021 0042   PROT 6.9 02/28/2021 0128   ALBUMIN 2.6 (L) 02/28/2021 0128   AST 81 (H) 02/28/2021 0128   ALT 63 (H) 02/28/2021 0128   ALKPHOS 89 02/28/2021 0128   BILITOT 1.5 (H) 02/28/2021 0128   GFRNONAA >60 03/01/2021 0042   Lipase     Component Value Date/Time   LIPASE 37 02/27/2021 1345       Studies/Results: CT ABDOMEN PELVIS W CONTRAST  Result Date: 02/27/2021 CLINICAL DATA:  Abdominal pain.   Incarcerated hernia suspected. EXAM: CT ABDOMEN AND PELVIS WITH CONTRAST TECHNIQUE: Multidetector CT imaging of the abdomen and pelvis was performed using the standard protocol following bolus administration of intravenous contrast. CONTRAST:  153mL OMNIPAQUE IOHEXOL 300 MG/ML  SOLN COMPARISON:  CT abdomen pelvis 02/23/2010 FINDINGS: Lower chest: No acute abnormality. Hepatobiliary: Nodular hepatic contour with an enlarged caudate lobe. Several hypodense lesions within the liver are smaller than 1 cm in too small to characterize. There is a fluid density lesion within the right hepatic lobe measuring up to 2.1 cm (3:14). Status post cholecystectomy. No biliary dilatation. Pancreas: No focal lesion. Normal pancreatic contour. No surrounding inflammatory changes. No main pancreatic ductal dilatation. Spleen: The spleen is enlarged measuring up to 14 cm. No focal abnormality. Adrenals/Urinary Tract: No adrenal nodule bilaterally. Bilateral kidneys enhance symmetrically. Subcentimeter hypodensities are too small to characterize. No hydronephrosis. No hydroureter. The urinary bladder is unremarkable. Stomach/Bowel: Proximal to mid small bowel is dilated with fluid. There is a transition point at the entrance of the small bowel into an umbilical hernia (9:21). A short segment of small bowel is noted to be dilated with fluid within the umbilical hernia with findings suggestive of a closed loop obstruction at the entry and exit sites. Associated bowel wall  thickening of this short segment of bowel is noted. No definite pneumatosis. Question trace free fluid surrounding this bowel loop. The remainder of the distal small bowel is completely collapsed. Small amount of stool is noted within the colon. Scattered sigmoid diverticulosis. No large bowel dilatation or bowel wall thickening. The appendix is not definitely identified. Vascular/Lymphatic: Trace venous collateral are noted within the upper abdomen. The portal, splenic,  superior mesenteric veins are patent. No abdominal aorta or iliac aneurysm. At least moderate atherosclerotic plaque of the aorta and its branches. No abdominal, pelvic, or inguinal lymphadenopathy. Reproductive: Uterus and bilateral adnexa are unremarkable. Other: Trace simple free fluid. No intraperitoneal free gas. No organized fluid collection. Musculoskeletal: Periumbilical hernia with a short loop of small bowel noted and described above. Superiorly there is likely a tiny fat containing ventral wall hernia with an abdominal defect that is difficult to measure considering it's small size (7:63, 3:40). Inferiorly there is another tiny fat and fluid containing ventral wall hernia with an abdominal defect of 2 mm (7:70, 3:57). No suspicious lytic or blastic osseous lesions. No acute displaced fracture. Multilevel degenerative changes of the spine. IMPRESSION: 1. Small-bowel obstruction with transition point at the entry site of an umbilical hernia. Associated closed loop obstruction of a short segment of small bowel within the umbilical hernia. Concern for ischemia and incarceration of the bowel loop within the hernia. Recommend emergent surgical consultation. 2. Cirrhosis with portal hypertension. A 2.1cm indetemriante fluid density lesion with otherwise markedly limited evaluation for focal hepatic lesion on this single phase portal venous study. Recommend MRI liver protocol for further evaluation. 3. Couple of other tiny ventral wall hernia containing fat and free fluid. 4. Scattered sigmoid diverticulosis with no acute diverticulitis. 5. Trace ascites. These results were called by telephone at the time of interpretation on 02/27/2021 at 4:29 pm to provider Dr. Nanda Quinton, who verbally acknowledged these results. Electronically Signed   By: Iven Finn M.D.   On: 02/27/2021 16:38   DG Abd Portable 1V  Result Date: 02/28/2021 CLINICAL DATA:  NG tube placement EXAM: PORTABLE ABDOMEN - 1 VIEW COMPARISON:   None. FINDINGS: Tip and side port of the esophageal catheter project within the stomach. IMPRESSION: Esophageal catheter tip in the stomach. Electronically Signed   By: Ulyses Jarred M.D.   On: 02/28/2021 01:40    Anti-infectives: Anti-infectives (From admission, onward)   Start     Dose/Rate Route Frequency Ordered Stop   02/27/21 1800  cefoTEtan (CEFOTAN) 2 g in sodium chloride 0.9 % 100 mL IVPB        2 g 200 mL/hr over 30 Minutes Intravenous  Once 02/27/21 1754 02/27/21 2000       Assessment/Plan Cirrhosis HTN  Incarcerated umbilical hernia  S/P open primary hernia repair 02/27/21 Dr. Grandville Silos - POD#2 - incision c/d/i - tolerating CLD but no bowel function yet, hold off on advancing past FLD.  - recommend PPI BID  - mobilize more today, abdominal binder when OOB  FEN: FLD, SLIV VTE: SCDs, no lovenox with plts 97K ID: [email or web address] 4/14  LOS: 2 days    Norm Parcel , [facility] 03/01/2021, 9:22 AM Please see Amion for pager number during day hours 7:00am-4:30pm

## 2021-03-02 DIAGNOSIS — K56609 Unspecified intestinal obstruction, unspecified as to partial versus complete obstruction: Secondary | ICD-10-CM | POA: Diagnosis not present

## 2021-03-02 DIAGNOSIS — K7469 Other cirrhosis of liver: Secondary | ICD-10-CM | POA: Diagnosis not present

## 2021-03-02 DIAGNOSIS — R1033 Periumbilical pain: Secondary | ICD-10-CM | POA: Diagnosis not present

## 2021-03-02 DIAGNOSIS — K420 Umbilical hernia with obstruction, without gangrene: Secondary | ICD-10-CM | POA: Diagnosis not present

## 2021-03-02 DIAGNOSIS — N179 Acute kidney failure, unspecified: Secondary | ICD-10-CM | POA: Diagnosis not present

## 2021-03-02 MED ORDER — PANTOPRAZOLE SODIUM 40 MG PO TBEC
40.0000 mg | DELAYED_RELEASE_TABLET | Freq: Two times a day (BID) | ORAL | Status: DC
  Administered 2021-03-02 – 2021-03-03 (×2): 40 mg via ORAL
  Filled 2021-03-02 (×2): qty 1

## 2021-03-02 NOTE — Progress Notes (Signed)
Progress Note  3 Days Post-Op  Subjective: Patient tolerating FLD diet and denies nausea or vomiting. Passing flatus. Updated her son on the phone.   Objective: Vital signs in last 24 hours: Temp:  [98.1 F (36.7 C)-98.6 F (37 C)] 98.1 F (36.7 C) (04/17 0400) Pulse Rate:  [72-74] 72 (04/17 0400) Resp:  [17-18] 18 (04/17 0400) BP: (114-147)/(50-79) 147/50 (04/17 0400) SpO2:  [90 %-95 %] 93 % (04/17 0400)    Intake/Output from previous day: No intake/output data recorded. Intake/Output this shift: No intake/output data recorded.  PE: General: pleasant, WD,obesefemale who is laying in bed in NAD Heart: regular, rate, and rhythm.  Lungs: CTAB, no wheezes, rhonchi, or rales noted. Respiratory effort nonlabored Abd: soft, appropriately ttp, ND, +BS,incision c/d/i    Lab Results:  Recent Labs    02/28/21 0128 03/01/21 0042  WBC 6.0 9.9  HGB 11.2* 10.4*  HCT 32.7* 29.9*  PLT 88* 97*   BMET Recent Labs    02/28/21 0128 03/01/21 0042  NA 133* 134*  K 4.1 3.8  CL 108 106  CO2 21* 25  GLUCOSE 152* 104*  BUN 25* 24*  CREATININE 1.06* 0.97  CALCIUM 9.8 10.1   PT/INR Recent Labs    02/27/21 1900  LABPROT 16.0*  INR 1.3*   CMP     Component Value Date/Time   NA 134 (L) 03/01/2021 0042   K 3.8 03/01/2021 0042   CL 106 03/01/2021 0042   CO2 25 03/01/2021 0042   GLUCOSE 104 (H) 03/01/2021 0042   BUN 24 (H) 03/01/2021 0042   CREATININE 0.97 03/01/2021 0042   CALCIUM 10.1 03/01/2021 0042   PROT 6.9 02/28/2021 0128   ALBUMIN 2.6 (L) 02/28/2021 0128   AST 81 (H) 02/28/2021 0128   ALT 63 (H) 02/28/2021 0128   ALKPHOS 89 02/28/2021 0128   BILITOT 1.5 (H) 02/28/2021 0128   GFRNONAA >60 03/01/2021 0042   Lipase     Component Value Date/Time   LIPASE 37 02/27/2021 1345       Studies/Results: MR LIVER W WO CONTRAST  Result Date: 03/01/2021 CLINICAL DATA:  Cirrhosis.  Indeterminate liver lesion on CT. EXAM: MRI ABDOMEN WITHOUT AND WITH CONTRAST  TECHNIQUE: Multiplanar multisequence MR imaging of the abdomen was performed both before and after the administration of intravenous contrast. CONTRAST:  7.70mL GADAVIST GADOBUTROL 1 MMOL/ML IV SOLN COMPARISON:  CTs of 02/27/2021 and 02/23/2010. FINDINGS: Lower chest: Normal heart size without pericardial or pleural effusion. Hepatobiliary: Marked cirrhosis. Multiple foci of hyperenhancement on series 16. These measure on the order of maximally 6 mm in the hepatic dome on 15/15. None demonstrate delayed washout or other suspicious characteristics. Corresponding to the CT abnormality is a simple cyst within the central left hepatic lobe 1.8 cm. Other smaller hepatic cysts are seen. Intrahepatic ducts are upper normal for prior cholecystectomy. There may also be peribiliary cysts. No common duct dilatation or choledocholithiasis. Pancreas:  Normal, without mass or ductal dilatation. Spleen:  Borderline splenomegaly at 13.7 cm craniocaudal. Adrenals/Urinary Tract: Normal adrenal glands. Bilateral tiny renal cysts. No hydronephrosis. Stomach/Bowel: Normal stomach and abdominal bowel loops. Vascular/Lymphatic: Aortic atherosclerosis. Patent portal splenic and hepatic veins. Enlarged portal vein, suggesting portal venous hypertension. No retroperitoneal or retrocrural adenopathy. Other:  No ascites. Musculoskeletal: No acute osseous abnormality. IMPRESSION: 1. Cirrhosis and portal venous hypertension. 2. No evidence of hepatocellular carcinoma. Multiple arterial phase foci of hyperenhancement are likely perfusion anomalies. LR 3. This could be re-evaluated with follow-up MRI at 6 months.  3.  Aortic Atherosclerosis (ICD10-I70.0). Electronically Signed   By: Abigail Miyamoto M.D.   On: 03/01/2021 12:49    Anti-infectives: Anti-infectives (From admission, onward)   Start     Dose/Rate Route Frequency Ordered Stop   02/27/21 1800  cefoTEtan (CEFOTAN) 2 g in sodium chloride 0.9 % 100 mL IVPB        2 g 200 mL/hr over 30  Minutes Intravenous  Once 02/27/21 1754 02/27/21 2000       Assessment/Plan Cirrhosis HTN  Incarcerated umbilical hernia  S/P open primary hernia repair 02/27/21 Dr. Grandville Silos - POD#3 - incision c/d/i - passing flatus - advance diet to regular - mobilize more today, abdominal binder when OOB - possibly home tomorrow from a surgery standpoint if tolerating diet   FEN: FLD, SLIV VTE: SCDs, no lovenox with plts 97K ID: [email or web address] 4/14  LOS: 3 days    Norm Parcel , [facility] 03/02/2021, 10:02 AM Please see Amion for pager number during day hours 7:00am-4:30pm

## 2021-03-02 NOTE — Progress Notes (Signed)
PROGRESS NOTE    Samantha Evans  BCW:[id number] DOB: November 30, 1960 DOA: 02/27/2021 PCP: Pcp, No    Brief Narrative:  60 year old female admitted to the hospital with nausea, vomiting and was found to have incarcerated umbilical hernia with small bowel obstruction.  She was admitted for operative management.  Incidental finding of underlying cirrhosis noted on CT imaging.   Assessment & Plan:   Active Problems:   Abdominal pain   Incarcerated umbilical hernia   Small bowel obstruction (HCC)   Hepatic cirrhosis ([place])- by CT scan   AKI (acute kidney injury) ([place])   Small bowel obstruction due to incarcerated umbilical hernia -Status post operative repair on 4/14 -NG tube removed 4/15 -currently on regular diet -no BM yet, but she is passing gas -encouraged to ambulate -Postop care per general surgery  Cirrhosis -No significant alcohol history -Viral panel positive for HCV ab, quantitative RNA pending -Will likely need to follow-up with GI as an outpatient for further work-up and management -Noted to have 2.1 cm fluid density in liver on CT, This was not appreciated on MRI  Thrombocytopenia -Suspect this is related to underlying liver disease -No signs of bleeding at present -Continue to monitor    DVT prophylaxis: SCDs Start: 02/27/21 2159  Code Status: Full code Family Communication: Discussed with patient Disposition Plan: Status is: Inpatient  Remains inpatient appropriate because:Inpatient level of care appropriate due to severity of illness   Dispo: The patient is from: Home              Anticipated d/c is to: Home              Patient currently is not medically stable to d/c.   Difficult to place patient No         Consultants:   General surgery  Procedures:   Repair of incarcerated umbilical hernia  Antimicrobials:      Subjective: No bowel movement yet. She is passing gas. Diet has been advance to regular diet. No  vomiting.  Objective: Vitals:   03/01/21 0400 03/01/21 1211 03/01/21 2007 03/02/21 0400  BP: (!) 145/65 114/79 (!) 144/57 (!) 147/50  Pulse:  74 74 72  Resp: 18 18 17 18   Temp: 98.2 F (36.8 C) 98.6 F (37 C) 98.4 F (36.9 C) 98.1 F (36.7 C)  TempSrc: Oral Oral Oral Oral  SpO2: 94% 90% 95% 93%  Weight:      Height:        Intake/Output Summary (Last 24 hours) at 03/02/2021 1417 Last data filed at 03/02/2021 0900 Gross per 24 hour  Intake 0 ml  Output --  Net 0 ml   Filed Weights   02/27/21 1346  Weight: 81.6 kg    Examination:  General exam: Alert, awake, oriented x 3 Respiratory system: Clear to auscultation. Respiratory effort normal. Cardiovascular system:RRR. No murmurs, rubs, gallops. Gastrointestinal system: Abdomen is nondistended, soft. No organomegaly or masses felt. Normal bowel sounds heard. Central nervous system: Alert and oriented. No focal neurological deficits. Extremities: No C/C/E, +pedal pulses Skin: No rashes, lesions or ulcers Psychiatry: Judgement and insight appear normal. Mood & affect appropriate.      Data Reviewed: I have personally reviewed following labs and imaging studies  CBC: Recent Labs  Lab 02/27/21 1345 02/28/21 0128 03/01/21 0042  WBC 6.2 6.0 9.9  NEUTROABS 4.5  --   --   HGB 12.6 11.2* 10.4*  HCT 36.5 32.7* 29.9*  MCV 96.1 97.6 96.5  PLT 135* 88* 97*  Basic Metabolic Panel: Recent Labs  Lab 02/27/21 1345 02/28/21 0128 03/01/21 0042  NA 136 133* 134*  K 4.1 4.1 3.8  CL 106 108 106  CO2 22 21* 25  GLUCOSE 107* 152* 104*  BUN 25* 25* 24*  CREATININE 1.18* 1.06* 0.97  CALCIUM 10.4* 9.8 10.1   GFR: Estimated Creatinine Clearance: 59.7 mL/min (by C-G formula based on SCr of 0.97 mg/dL). Liver Function Tests: Recent Labs  Lab 02/27/21 1345 02/28/21 0128  AST 105* 81*  ALT 72* 63*  ALKPHOS 94 89  BILITOT 2.8* 1.5*  PROT 7.3 6.9  ALBUMIN 3.0* 2.6*   Recent Labs  Lab 02/27/21 1345  LIPASE 37   No  results for input(s): AMMONIA in the last 168 hours. Coagulation Profile: Recent Labs  Lab 02/27/21 1900  INR 1.3*   Cardiac Enzymes: No results for input(s): CKTOTAL, CKMB, CKMBINDEX, TROPONINI in the last 168 hours. BNP (last 3 results) No results for input(s): PROBNP in the last 8760 hours. HbA1C: No results for input(s): HGBA1C in the last 72 hours. CBG: No results for input(s): GLUCAP in the last 168 hours. Lipid Profile: No results for input(s): CHOL, HDL, LDLCALC, TRIG, CHOLHDL, LDLDIRECT in the last 72 hours. Thyroid Function Tests: No results for input(s): TSH, T4TOTAL, FREET4, T3FREE, THYROIDAB in the last 72 hours. Anemia Panel: No results for input(s): VITAMINB12, FOLATE, FERRITIN, TIBC, IRON, RETICCTPCT in the last 72 hours. Sepsis Labs: Recent Labs  Lab 02/27/21 1657 02/27/21 2200  LATICACIDVEN 1.8 1.4    Recent Results (from the past 240 hour(s))  Resp Panel by RT-PCR (Flu A&B, Covid) Nasopharyngeal Swab     Status: None   Collection Time: 02/27/21  4:57 PM   Specimen: Nasopharyngeal Swab; Nasopharyngeal(NP) swabs in vial transport medium  Result Value Ref Range Status   SARS Coronavirus 2 by RT PCR NEGATIVE NEGATIVE Final    Comment: (NOTE) SARS-CoV-2 target nucleic acids are NOT DETECTED.  The SARS-CoV-2 RNA is generally detectable in upper respiratory specimens during the acute phase of infection. The lowest concentration of SARS-CoV-2 viral copies this assay can detect is 138 copies/mL. A negative result does not preclude SARS-Cov-2 infection and should not be used as the sole basis for treatment or other patient management decisions. A negative result may occur with  improper specimen collection/handling, submission of specimen other than nasopharyngeal swab, presence of viral mutation(s) within the areas targeted by this assay, and inadequate number of viral copies(<138 copies/mL). A negative result must be combined with clinical observations,  patient history, and epidemiological information. The expected result is Negative.  Fact Sheet for Patients:  EntrepreneurPulse.com.au  Fact Sheet for Healthcare Providers:  IncredibleEmployment.be  This test is no t yet approved or cleared by the [place] FDA and  has been authorized for detection and/or diagnosis of SARS-CoV-2 by FDA under an Emergency Use Authorization (EUA). This EUA will remain  in effect (meaning this test can be used) for the duration of the COVID-19 declaration under Section 564(b)(1) of the Act, 21 U.S.C.section 360bbb-3(b)(1), unless the authorization is terminated  or revoked sooner.       Influenza A by PCR NEGATIVE NEGATIVE Final   Influenza B by PCR NEGATIVE NEGATIVE Final    Comment: (NOTE) The Xpert Xpress SARS-CoV-2/FLU/RSV plus assay is intended as an aid in the diagnosis of influenza from Nasopharyngeal swab specimens and should not be used as a sole basis for treatment. Nasal washings and aspirates are unacceptable for Xpert Xpress SARS-CoV-2/FLU/RSV testing.  Fact  Sheet for Patients: EntrepreneurPulse.com.au  Fact Sheet for Healthcare Providers: IncredibleEmployment.be  This test is not yet approved or cleared by the [place] FDA and has been authorized for detection and/or diagnosis of SARS-CoV-2 by FDA under an Emergency Use Authorization (EUA). This EUA will remain in effect (meaning this test can be used) for the duration of the COVID-19 declaration under Section 564(b)(1) of the Act, 21 U.S.C. section 360bbb-3(b)(1), unless the authorization is terminated or revoked.  Performed at [facility], [place] [address] [place], [place] [address]          Radiology Studies: MR LIVER W WO CONTRAST  Result Date: 03/01/2021 CLINICAL DATA:  Cirrhosis.  Indeterminate liver lesion on CT. EXAM: MRI ABDOMEN WITHOUT AND WITH CONTRAST TECHNIQUE:  Multiplanar multisequence MR imaging of the abdomen was performed both before and after the administration of intravenous contrast. CONTRAST:  7.67mL GADAVIST GADOBUTROL 1 MMOL/ML IV SOLN COMPARISON:  CTs of 02/27/2021 and 02/23/2010. FINDINGS: Lower chest: Normal heart size without pericardial or pleural effusion. Hepatobiliary: Marked cirrhosis. Multiple foci of hyperenhancement on series 16. These measure on the order of maximally 6 mm in the hepatic dome on 15/15. None demonstrate delayed washout or other suspicious characteristics. Corresponding to the CT abnormality is a simple cyst within the central left hepatic lobe 1.8 cm. Other smaller hepatic cysts are seen. Intrahepatic ducts are upper normal for prior cholecystectomy. There may also be peribiliary cysts. No common duct dilatation or choledocholithiasis. Pancreas:  Normal, without mass or ductal dilatation. Spleen:  Borderline splenomegaly at 13.7 cm craniocaudal. Adrenals/Urinary Tract: Normal adrenal glands. Bilateral tiny renal cysts. No hydronephrosis. Stomach/Bowel: Normal stomach and abdominal bowel loops. Vascular/Lymphatic: Aortic atherosclerosis. Patent portal splenic and hepatic veins. Enlarged portal vein, suggesting portal venous hypertension. No retroperitoneal or retrocrural adenopathy. Other:  No ascites. Musculoskeletal: No acute osseous abnormality. IMPRESSION: 1. Cirrhosis and portal venous hypertension. 2. No evidence of hepatocellular carcinoma. Multiple arterial phase foci of hyperenhancement are likely perfusion anomalies. LR 3. This could be re-evaluated with follow-up MRI at 6 months. 3.  Aortic Atherosclerosis (ICD10-I70.0). Electronically Signed   By: Abigail Miyamoto M.D.   On: 03/01/2021 12:49        Scheduled Meds: . pantoprazole  40 mg Oral BID   Continuous Infusions:    LOS: 3 days    Time spent: 30 minutes    Kathie Dike, MD [facility]   If 7PM-7AM, please contact  night-coverage [email or web address]  03/02/2021, 2:17 PM

## 2021-03-03 DIAGNOSIS — K7469 Other cirrhosis of liver: Secondary | ICD-10-CM | POA: Diagnosis not present

## 2021-03-03 DIAGNOSIS — K56609 Unspecified intestinal obstruction, unspecified as to partial versus complete obstruction: Secondary | ICD-10-CM | POA: Diagnosis not present

## 2021-03-03 DIAGNOSIS — N179 Acute kidney failure, unspecified: Secondary | ICD-10-CM | POA: Diagnosis not present

## 2021-03-03 DIAGNOSIS — R1033 Periumbilical pain: Secondary | ICD-10-CM | POA: Diagnosis not present

## 2021-03-03 DIAGNOSIS — K420 Umbilical hernia with obstruction, without gangrene: Secondary | ICD-10-CM | POA: Diagnosis not present

## 2021-03-03 LAB — HCV RNA QUANT
HCV Quantitative Log: 5.98 log10 IU/mL (ref >1.70)
HCV Quantitative: 954000 IU/mL (ref >50)

## 2021-03-03 MED ORDER — TRAMADOL HCL 50 MG PO TABS: 50.0000 mg | ORAL_TABLET | Freq: Four times a day (QID) | ORAL | 0 refills | Status: DC | PRN

## 2021-03-03 MED ORDER — POLYETHYLENE GLYCOL 3350 17 G PO PACK: 17.0000 g | PACK | Freq: Every day | ORAL | Status: DC | PRN

## 2021-03-03 MED ORDER — DOCUSATE SODIUM 100 MG PO CAPS
100.0000 mg | ORAL_CAPSULE | Freq: Two times a day (BID) | ORAL | Status: DC
  Administered 2021-03-03: 100 mg via ORAL
  Filled 2021-03-03: qty 1

## 2021-03-03 MED ORDER — POLYETHYLENE GLYCOL 3350 17 G PO PACK: 17.0000 g | PACK | Freq: Every day | ORAL | 0 refills | Status: DC | PRN

## 2021-03-03 MED ORDER — PANTOPRAZOLE SODIUM 40 MG PO TBEC: 40.0000 mg | DELAYED_RELEASE_TABLET | Freq: Two times a day (BID) | ORAL | 0 refills | Status: DC

## 2021-03-03 MED ORDER — POLYETHYLENE GLYCOL 3350 17 G PO PACK
17.0000 g | PACK | Freq: Every day | ORAL | Status: DC
  Administered 2021-03-03: 17 g via ORAL
  Filled 2021-03-03: qty 1

## 2021-03-03 MED ORDER — DOCUSATE SODIUM 100 MG PO CAPS
100.0000 mg | ORAL_CAPSULE | Freq: Two times a day (BID) | ORAL | 0 refills | Status: DC
Start: 2021-03-03 — End: 2021-06-23

## 2021-03-03 NOTE — Progress Notes (Signed)
Physical Therapy Treatment Patient Details Name: Samantha Evans MRN: [id number] DOB: 13-Jan-1961 Today's Date: 03/03/2021    History of Present Illness Patient is a 60 y/o female who presents on 02/27/21 with N/V and pain from umbilical hernia. Abdominal CT-umbilical hernia w/ secondary SBO and signs of inflammation now s/p repair of incarcerated umbilical hernia 1/91/47, incidental finding of non-alcholic cirrhosis on imaging. PMH: HTN    PT Comments    Pt received up ad lib in room, pleasantly agreeable to therapy session and with good participation and tolerance for hallway gait and stair training. Pt making good progress toward goals and needing up to min guard for stair training and Supervision for gait with no AD. Of note, pt SpO2 desat to 90% on RA with exertion and while pt talking but with continued ambulation and slow/deep breaths, SpO2 improves to 92-94% on RA; pt SpO2 95% resting on RA so encouraged to use IS and able to reach ~1,000 on IS with teachback technique. Pt continues to benefit from PT services to progress toward functional mobility goals. Anticipate pt safe to DC home once medically cleared.   Follow Up Recommendations  No PT follow up;Supervision for mobility/OOB     Equipment Recommendations  None recommended by PT    Recommendations for Other Services       Precautions / Restrictions Precautions Precautions: Other (comment);Fall Precaution Comments: abdominal incision Required Braces or Orthoses: Other Brace Other Brace: abdominal binder for OOB Restrictions Weight Bearing Restrictions: No    Mobility  Bed Mobility               General bed mobility comments: not assessed this session, up in chair pre-post    Transfers Overall transfer level: Needs assistance Equipment used: None Transfers: Sit to/from Stand Sit to Stand: Modified independent (Device/Increase time)         General transfer comment: from chair, no AD, no  LOB  Ambulation/Gait Ambulation/Gait assistance: Min guard;Supervision Gait Distance (Feet): 600 Feet Assistive device: None Gait Pattern/deviations: Step-through pattern;Decreased stride length Gait velocity: variable; grossly 0.4-0.6 m/s   General Gait Details: improved stability, pt performed DGI (see below) with no AD and no LOB; SpO2 90-95% on RA, does drop to 90-91% when talking and walking so encouraged pursed-lip breathing and use of IS after gait trial   Stairs Stairs: Yes Stairs assistance: Supervision Stair Management: Alternating pattern;Step to pattern;Forwards (using L wall for support ascending and R wall descending per home setup) Number of Stairs: 12 General stair comments: pt ascended 12 steps with L wall support and reciprocal pattern, no difficulty or LOB; on descending, pt at times with step-to pattern and at times alternating; encouraged her to descend with step-to pattern for safety as she is slightly steadier in this manner   Wheelchair Mobility    Modified Rankin (Stroke Patients Only)       Balance Overall balance assessment: Needs assistance Sitting-balance support: Feet supported;No upper extremity supported Sitting balance-Leahy Scale: Good     Standing balance support: No upper extremity supported;During functional activity Standing balance-Leahy Scale: Good Standing balance comment: see DGI; head turns, obstacles without LOB and no AD                 Standardized Balance Assessment Standardized Balance Assessment : Dynamic Gait Index   Dynamic Gait Index Level Surface: Normal Change in Gait Speed: Normal Gait with Horizontal Head Turns: Mild Impairment Gait with Vertical Head Turns: Normal Gait and Pivot Turn: Normal Step Over Obstacle:  Mild Impairment Step Around Obstacles: Normal Steps: Moderate Impairment Total Score: 20      Cognition Arousal/Alertness: Awake/alert Behavior During Therapy: WFL for tasks  assessed/performed Overall Cognitive Status: Within Functional Limits for tasks assessed                                 General Comments: poor dentition so at times speech difficult to understand but follows 1 and 2-step commands well.      Exercises General Exercises - Lower Extremity Ankle Circles/Pumps: AROM;Both;5 reps;Seated Long Arc Quad: AROM;Both;5 reps;Seated Hip ABduction/ADduction:  (verbal/visual review (pt seated in chair)) Straight Leg Raises:  (verbal/visual review (pt seated in chair))    General Comments General comments (skin integrity, edema, etc.): pt performed Incentive Spirometer x5 reps and encouraged her to continue until follow-up appt as she desats currently to 90% with exertion while talking (but improves with deep breaths and not talking while walking up to 95%); HR 80's bpm with exertion      Pertinent Vitals/Pain Pain Assessment: Faces Faces Pain Scale: No hurt Pain Location: abdomen Pain Intervention(s): Monitored during session;Repositioned           PT Goals (current goals can now be found in the care plan section) Acute Rehab PT Goals Patient Stated Goal: keep moving and feel better PT Goal Formulation: With patient Time For Goal Achievement: 03/14/21 Potential to Achieve Goals: Good Progress towards PT goals: Progressing toward goals    Frequency    Min 3X/week      PT Plan Current plan remains appropriate    Co-evaluation              AM-PAC PT "6 Clicks" Mobility   Outcome Measure  Help needed turning from your back to your side while in a flat bed without using bedrails?: None Help needed moving from lying on your back to sitting on the side of a flat bed without using bedrails?: A Little Help needed moving to and from a bed to a chair (including a wheelchair)?: None Help needed standing up from a chair using your arms (e.g., wheelchair or bedside chair)?: None Help needed to walk in hospital room?: A  Little Help needed climbing 3-5 steps with a railing? : A Little 6 Click Score: 21    End of Session Equipment Utilized During Treatment: Gait belt;Other (comment) (abdominal binder) Activity Tolerance: Patient tolerated treatment well Patient left: in chair;with call bell/phone within reach Nurse Communication: Mobility status;Other (comment) (SpO2 ~90% with exertion, encourage IS use) PT Visit Diagnosis: Pain;Unsteadiness on feet (R26.81);Other abnormalities of gait and mobility (R26.89)     Time: 5621-3086 PT Time Calculation (min) (ACUTE ONLY): 19 min  Charges:  $Gait Training: 8-22 mins                     Hadassah Rana P., PTA Acute Rehabilitation Services Pager: [phone] Office: [place] 03/03/2021, 11:27 AM

## 2021-03-03 NOTE — Social Work (Signed)
CSW met with pt at bedside per her request. Pt stated she needed help with applying for disability and wanted help with housing. Pt reports she lives at home with son and 3 other men but would prefer her own place. CSW explained that they do not help with disability or housing. CSW did give pt handout on how to apply for disability. Pt thanked CSW.   Emeterio Reeve, Latanya Presser, [place] Social Worker [phone]

## 2021-03-03 NOTE — Progress Notes (Signed)
[facility] Progress Note  4 Days Post-Op  Subjective: CC-  Comfortable this morning. Abdominal pain well controlled. Denies n/v. Tolerating diet. Passing flatus, no BM.  Objective: Vital signs in last 24 hours: Temp:  [98.5 F (36.9 C)-98.6 F (37 C)] 98.6 F (37 C) (04/18 0426) Pulse Rate:  [68-74] 74 (04/18 0426) Resp:  [16-18] 17 (04/18 0426) BP: (145-147)/(66-70) 147/70 (04/18 0426) SpO2:  [94 %-100 %] 94 % (04/18 0426) Weight:  [80.3 kg] 80.3 kg (04/18 0500) Last BM Date: 03/02/21  Intake/Output from previous day: 04/17 0701 - 04/18 0700 In: 360 [P.O.:360] Out: -  Intake/Output this shift: Total I/O In: 280 [P.O.:280] Out: -   PE: Gen:  Alert, NAD, pleasant Pulm: rate and effort normal Abd: Soft, nontender, +BS, incision cdi without erythema or drainage  Lab Results:  Recent Labs    03/01/21 0042  WBC 9.9  HGB 10.4*  HCT 29.9*  PLT 97*   BMET Recent Labs    03/01/21 0042  NA 134*  K 3.8  CL 106  CO2 25  GLUCOSE 104*  BUN 24*  CREATININE 0.97  CALCIUM 10.1   PT/INR No results for input(s): LABPROT, INR in the last 72 hours. CMP     Component Value Date/Time   NA 134 (L) 03/01/2021 0042   K 3.8 03/01/2021 0042   CL 106 03/01/2021 0042   CO2 25 03/01/2021 0042   GLUCOSE 104 (H) 03/01/2021 0042   BUN 24 (H) 03/01/2021 0042   CREATININE 0.97 03/01/2021 0042   CALCIUM 10.1 03/01/2021 0042   PROT 6.9 02/28/2021 0128   ALBUMIN 2.6 (L) 02/28/2021 0128   AST 81 (H) 02/28/2021 0128   ALT 63 (H) 02/28/2021 0128   ALKPHOS 89 02/28/2021 0128   BILITOT 1.5 (H) 02/28/2021 0128   GFRNONAA >60 03/01/2021 0042   Lipase     Component Value Date/Time   LIPASE 37 02/27/2021 1345       Studies/Results: No results found.  Anti-infectives: Anti-infectives (From admission, onward)   Start     Dose/Rate Route Frequency Ordered Stop   02/27/21 1800  cefoTEtan (CEFOTAN) 2 g in sodium chloride 0.9 % 100 mL IVPB        2 g 200 mL/hr  over 30 Minutes Intravenous  Once 02/27/21 1754 02/27/21 2000       Assessment/Plan Cirrhosis HTN  Incarcerated umbilical hernia  S/P open primary hernia repair 02/27/21 Dr. Grandville Silos - POD#4 - incision c/d/i - passing flatus and tolerating regular diet. Add bowel regimen - continue mobilizing, abdominal binder when [place] for discharge today from surgical standpoint. Discharge instructions and follow up info on AVS.  I will send tramadol rx to her pharmacy.  FEN:reg diet VTE: SCDs,no lovenox with plts 97K ID: [email or web address] 4/14   LOS: 4 days    Wellington Hampshire, [facility] Surgery 03/03/2021, 9:34 AM Please see Amion for pager number during day hours 7:00am-4:30pm

## 2021-03-03 NOTE — Progress Notes (Deleted)
Physician Discharge Summary  Samantha Evans DJM:[id number] DOB: February 22, 1961 DOA: 02/27/2021  PCP: Pcp, No Patient has an appointment with [facility] on 5/18  Admit date: 02/27/2021 Discharge date: 03/03/2021  Admitted From: home Disposition:  home  Recommendations for Outpatient Follow-up:  1. Follow up with PCP in 1-2 weeks 2. Please obtain BMP/CBC in one week 3. Follow up with Gen surg as scheduled on 5/3 4. Referral made to gastroenterology on 4/21 to discuss cirrhosis   Discharge Condition:stable CODE STATUS:full code Diet recommendation: heart healthy  Brief/Interim Summary: 60 year old female admitted to the hospital with nausea, vomiting and was found to have incarcerated umbilical hernia with small bowel obstruction.  She was admitted for operative management.  Incidental finding of underlying cirrhosis noted on CT imaging.  Discharge Diagnoses:  Active Problems:   Abdominal pain   Incarcerated umbilical hernia   Small bowel obstruction (HCC)   Hepatic cirrhosis ([place])- by CT scan   AKI (acute kidney injury) ([place])  Small bowel obstruction due to incarcerated umbilical hernia -Status post operative repair on 4/14 -NG tube removed 4/15 -currently on regular diet -She is passing gas and tolerating diet -encouraged to ambulate -will provide miralax on discharge -cleared by surgery for discharge  Cirrhosis -No significant alcohol history -Viral panel positive for HCV ab, quantitative RNA pending -referral to [facility] made to discuss further work up/treatment options -Noted to have 2.1 cm fluid density in liver on CT, This was not appreciated on MRI  Thrombocytopenia -Suspect this is related to underlying liver disease -No signs of bleeding at present -Continue to monitor  Discharge Instructions  Discharge Instructions    Ambulatory referral to Gastroenterology   Complete by: As directed    What is the reason for referral?: Other  Comment - new diagnosis of HepC cirrhosis   Diet - low sodium heart healthy   Complete by: As directed    Discharge wound care:   Complete by: As directed    Keep incisions clean and dry   Increase activity slowly   Complete by: As directed      Allergies as of 03/03/2021   No Known Allergies     Medication List    STOP taking these medications   naproxen 500 MG tablet Commonly known as: NAPROSYN     TAKE these medications   docusate sodium 100 MG capsule Commonly known as: COLACE Take 1 capsule (100 mg total) by mouth 2 (two) times daily.   pantoprazole 40 MG tablet Commonly known as: PROTONIX Take 1 tablet (40 mg total) by mouth 2 (two) times daily.   polyethylene glycol 17 g packet Commonly known as: MIRALAX / GLYCOLAX Take 17 g by mouth daily as needed for mild constipation.   traMADol 50 MG tablet Commonly known as: ULTRAM Take 1 tablet (50 mg total) by mouth every 6 (six) hours as needed for severe pain.            Discharge Care Instructions  (From admission, onward)         Start     Ordered   03/03/21 0000  Discharge wound care:       Comments: Keep incisions clean and dry   03/03/21 1223          Follow-up [facility] Go on 03/18/2021.   Specialty: General Surgery Why: Your appointment is 5/3 at 11:15am Please arrive 30 minutes prior to your appointment to check in and fill  out paperwork. Bring photo ID and insurance information. Contact information: [address] [place] [phone]       Alfredia Ferguson, PA-C Follow up on 03/06/2021.   Specialty: Gastroenterology Why: 10:30am to discuss liver disease Contact information: [place] [address] [phone]              No Known Allergies  Consultations:  Gen surgery   Procedures/Studies: CT ABDOMEN PELVIS W CONTRAST  Result Date: 02/27/2021 CLINICAL DATA:  Abdominal pain.   Incarcerated hernia suspected. EXAM: CT ABDOMEN AND PELVIS WITH CONTRAST TECHNIQUE: Multidetector CT imaging of the abdomen and pelvis was performed using the standard protocol following bolus administration of intravenous contrast. CONTRAST:  122mL OMNIPAQUE IOHEXOL 300 MG/ML  SOLN COMPARISON:  CT abdomen pelvis 02/23/2010 FINDINGS: Lower chest: No acute abnormality. Hepatobiliary: Nodular hepatic contour with an enlarged caudate lobe. Several hypodense lesions within the liver are smaller than 1 cm in too small to characterize. There is a fluid density lesion within the right hepatic lobe measuring up to 2.1 cm (3:14). Status post cholecystectomy. No biliary dilatation. Pancreas: No focal lesion. Normal pancreatic contour. No surrounding inflammatory changes. No main pancreatic ductal dilatation. Spleen: The spleen is enlarged measuring up to 14 cm. No focal abnormality. Adrenals/Urinary Tract: No adrenal nodule bilaterally. Bilateral kidneys enhance symmetrically. Subcentimeter hypodensities are too small to characterize. No hydronephrosis. No hydroureter. The urinary bladder is unremarkable. Stomach/Bowel: Proximal to mid small bowel is dilated with fluid. There is a transition point at the entrance of the small bowel into an umbilical hernia (2:58). A short segment of small bowel is noted to be dilated with fluid within the umbilical hernia with findings suggestive of a closed loop obstruction at the entry and exit sites. Associated bowel wall thickening of this short segment of bowel is noted. No definite pneumatosis. Question trace free fluid surrounding this bowel loop. The remainder of the distal small bowel is completely collapsed. Small amount of stool is noted within the colon. Scattered sigmoid diverticulosis. No large bowel dilatation or bowel wall thickening. The appendix is not definitely identified. Vascular/Lymphatic: Trace venous collateral are noted within the upper abdomen. The portal, splenic,  superior mesenteric veins are patent. No abdominal aorta or iliac aneurysm. At least moderate atherosclerotic plaque of the aorta and its branches. No abdominal, pelvic, or inguinal lymphadenopathy. Reproductive: Uterus and bilateral adnexa are unremarkable. Other: Trace simple free fluid. No intraperitoneal free gas. No organized fluid collection. Musculoskeletal: Periumbilical hernia with a short loop of small bowel noted and described above. Superiorly there is likely a tiny fat containing ventral wall hernia with an abdominal defect that is difficult to measure considering it's small size (7:63, 3:40). Inferiorly there is another tiny fat and fluid containing ventral wall hernia with an abdominal defect of 2 mm (7:70, 3:57). No suspicious lytic or blastic osseous lesions. No acute displaced fracture. Multilevel degenerative changes of the spine. IMPRESSION: 1. Small-bowel obstruction with transition point at the entry site of an umbilical hernia. Associated closed loop obstruction of a short segment of small bowel within the umbilical hernia. Concern for ischemia and incarceration of the bowel loop within the hernia. Recommend emergent surgical consultation. 2. Cirrhosis with portal hypertension. A 2.1cm indetemriante fluid density lesion with otherwise markedly limited evaluation for focal hepatic lesion on this single phase portal venous study. Recommend MRI liver protocol for further evaluation. 3. Couple of other tiny ventral wall hernia containing fat and free fluid.  4. Scattered sigmoid diverticulosis with no acute diverticulitis. 5. Trace ascites. These results were called by telephone at the time of interpretation on 02/27/2021 at 4:29 pm to provider Dr. Nanda Quinton, who verbally acknowledged these results. Electronically Signed   By: Iven Finn M.D.   On: 02/27/2021 16:38   MR LIVER W WO CONTRAST  Result Date: 03/01/2021 CLINICAL DATA:  Cirrhosis.  Indeterminate liver lesion on CT. EXAM: MRI  ABDOMEN WITHOUT AND WITH CONTRAST TECHNIQUE: Multiplanar multisequence MR imaging of the abdomen was performed both before and after the administration of intravenous contrast. CONTRAST:  7.13mL GADAVIST GADOBUTROL 1 MMOL/ML IV SOLN COMPARISON:  CTs of 02/27/2021 and 02/23/2010. FINDINGS: Lower chest: Normal heart size without pericardial or pleural effusion. Hepatobiliary: Marked cirrhosis. Multiple foci of hyperenhancement on series 16. These measure on the order of maximally 6 mm in the hepatic dome on 15/15. None demonstrate delayed washout or other suspicious characteristics. Corresponding to the CT abnormality is a simple cyst within the central left hepatic lobe 1.8 cm. Other smaller hepatic cysts are seen. Intrahepatic ducts are upper normal for prior cholecystectomy. There may also be peribiliary cysts. No common duct dilatation or choledocholithiasis. Pancreas:  Normal, without mass or ductal dilatation. Spleen:  Borderline splenomegaly at 13.7 cm craniocaudal. Adrenals/Urinary Tract: Normal adrenal glands. Bilateral tiny renal cysts. No hydronephrosis. Stomach/Bowel: Normal stomach and abdominal bowel loops. Vascular/Lymphatic: Aortic atherosclerosis. Patent portal splenic and hepatic veins. Enlarged portal vein, suggesting portal venous hypertension. No retroperitoneal or retrocrural adenopathy. Other:  No ascites. Musculoskeletal: No acute osseous abnormality. IMPRESSION: 1. Cirrhosis and portal venous hypertension. 2. No evidence of hepatocellular carcinoma. Multiple arterial phase foci of hyperenhancement are likely perfusion anomalies. LR 3. This could be re-evaluated with follow-up MRI at 6 months. 3.  Aortic Atherosclerosis (ICD10-I70.0). Electronically Signed   By: Abigail Miyamoto M.D.   On: 03/01/2021 12:49   DG Abd Portable 1V  Result Date: 02/28/2021 CLINICAL DATA:  NG tube placement EXAM: PORTABLE ABDOMEN - 1 VIEW COMPARISON:  None. FINDINGS: Tip and side port of the esophageal catheter  project within the stomach. IMPRESSION: Esophageal catheter tip in the stomach. Electronically Signed   By: Ulyses Jarred M.D.   On: 02/28/2021 01:40       Subjective: No abd pain. No vomiting. Passing gas, tolerating diet  Discharge Exam: Vitals:   03/02/21 1458 03/02/21 1954 03/03/21 0426 03/03/21 0500  BP: (!) 145/69 (!) 147/66 (!) 147/70   Pulse: 70 68 74   Resp: 16 18 17    Temp: 98.6 F (37 C) 98.5 F (36.9 C) 98.6 F (37 C)   TempSrc: Oral Oral Oral   SpO2: 96% 100% 94%   Weight:    80.3 kg  Height:        General: Pt is alert, awake, not in acute distress Cardiovascular: RRR, S1/S2 +, no rubs, no gallops Respiratory: CTA bilaterally, no wheezing, no rhonchi Abdominal: Soft, NT, ND, bowel sounds + Extremities: no edema, no cyanosis    The results of significant diagnostics from this hospitalization (including imaging, microbiology, ancillary and laboratory) are listed below for reference.     Microbiology: Recent Results (from the past 240 hour(s))  Resp Panel by RT-PCR (Flu A&B, Covid) Nasopharyngeal Swab     Status: None   Collection Time: 02/27/21  4:57 PM   Specimen: Nasopharyngeal Swab; Nasopharyngeal(NP) swabs in vial transport medium  Result Value Ref Range Status   SARS Coronavirus 2 by RT PCR NEGATIVE NEGATIVE Final    Comment: (  NOTE) SARS-CoV-2 target nucleic acids are NOT DETECTED.  The SARS-CoV-2 RNA is generally detectable in upper respiratory specimens during the acute phase of infection. The lowest concentration of SARS-CoV-2 viral copies this assay can detect is 138 copies/mL. A negative result does not preclude SARS-Cov-2 infection and should not be used as the sole basis for treatment or other patient management decisions. A negative result may occur with  improper specimen collection/handling, submission of specimen other than nasopharyngeal swab, presence of viral mutation(s) within the areas targeted by this assay, and inadequate number  of viral copies(<138 copies/mL). A negative result must be combined with clinical observations, patient history, and epidemiological information. The expected result is Negative.  Fact Sheet for Patients:  EntrepreneurPulse.com.au  Fact Sheet for Healthcare Providers:  IncredibleEmployment.be  This test is no t yet approved or cleared by the [place] FDA and  has been authorized for detection and/or diagnosis of SARS-CoV-2 by FDA under an Emergency Use Authorization (EUA). This EUA will remain  in effect (meaning this test can be used) for the duration of the COVID-19 declaration under Section 564(b)(1) of the Act, 21 U.S.C.section 360bbb-3(b)(1), unless the authorization is terminated  or revoked sooner.       Influenza A by PCR NEGATIVE NEGATIVE Final   Influenza B by PCR NEGATIVE NEGATIVE Final    Comment: (NOTE) The Xpert Xpress SARS-CoV-2/FLU/RSV plus assay is intended as an aid in the diagnosis of influenza from Nasopharyngeal swab specimens and should not be used as a sole basis for treatment. Nasal washings and aspirates are unacceptable for Xpert Xpress SARS-CoV-2/FLU/RSV testing.  Fact Sheet for Patients: EntrepreneurPulse.com.au  Fact Sheet for Healthcare Providers: IncredibleEmployment.be  This test is not yet approved or cleared by the [place] FDA and has been authorized for detection and/or diagnosis of SARS-CoV-2 by FDA under an Emergency Use Authorization (EUA). This EUA will remain in effect (meaning this test can be used) for the duration of the COVID-19 declaration under Section 564(b)(1) of the Act, 21 U.S.C. section 360bbb-3(b)(1), unless the authorization is terminated or revoked.  Performed at [facility], [place] [address], [place], [place] [address]      Labs: BNP (last 3 results) No results for input(s): BNP in the last 8760 hours. Basic Metabolic  Panel: Recent Labs  Lab 02/27/21 1345 02/28/21 0128 03/01/21 0042  NA 136 133* 134*  K 4.1 4.1 3.8  CL 106 108 106  CO2 22 21* 25  GLUCOSE 107* 152* 104*  BUN 25* 25* 24*  CREATININE 1.18* 1.06* 0.97  CALCIUM 10.4* 9.8 10.1   Liver Function Tests: Recent Labs  Lab 02/27/21 1345 02/28/21 0128  AST 105* 81*  ALT 72* 63*  ALKPHOS 94 89  BILITOT 2.8* 1.5*  PROT 7.3 6.9  ALBUMIN 3.0* 2.6*   Recent Labs  Lab 02/27/21 1345  LIPASE 37   No results for input(s): AMMONIA in the last 168 hours. CBC: Recent Labs  Lab 02/27/21 1345 02/28/21 0128 03/01/21 0042  WBC 6.2 6.0 9.9  NEUTROABS 4.5  --   --   HGB 12.6 11.2* 10.4*  HCT 36.5 32.7* 29.9*  MCV 96.1 97.6 96.5  PLT 135* 88* 97*   Cardiac Enzymes: No results for input(s): CKTOTAL, CKMB, CKMBINDEX, TROPONINI in the last 168 hours. BNP: Invalid input(s): POCBNP CBG: No results for input(s): GLUCAP in the last 168 hours. D-Dimer No results for input(s): DDIMER in the last 72 hours. Hgb A1c No results for input(s): HGBA1C in the  last 72 hours. Lipid Profile No results for input(s): CHOL, HDL, LDLCALC, TRIG, CHOLHDL, LDLDIRECT in the last 72 hours. Thyroid function studies No results for input(s): TSH, T4TOTAL, T3FREE, THYROIDAB in the last 72 hours.  Invalid input(s): FREET3 Anemia work up No results for input(s): VITAMINB12, FOLATE, FERRITIN, TIBC, IRON, RETICCTPCT in the last 72 hours. Urinalysis    Component Value Date/Time   COLORURINE AMBER (A) 02/23/2021 1013   APPEARANCEUR HAZY (A) 02/23/2021 1013   LABSPEC 1.019 02/23/2021 1013   PHURINE 6.0 02/23/2021 1013   GLUCOSEU NEGATIVE 02/23/2021 1013   HGBUR MODERATE (A) 02/23/2021 1013   BILIRUBINUR NEGATIVE 02/23/2021 1013   KETONESUR NEGATIVE 02/23/2021 1013   PROTEINUR 100 (A) 02/23/2021 1013   UROBILINOGEN 1.0 02/23/2010 2204   NITRITE NEGATIVE 02/23/2021 1013   LEUKOCYTESUR NEGATIVE 02/23/2021 1013   Sepsis Labs Invalid input(s): PROCALCITONIN,   WBC,  LACTICIDVEN Microbiology Recent Results (from the past 240 hour(s))  Resp Panel by RT-PCR (Flu A&B, Covid) Nasopharyngeal Swab     Status: None   Collection Time: 02/27/21  4:57 PM   Specimen: Nasopharyngeal Swab; Nasopharyngeal(NP) swabs in vial transport medium  Result Value Ref Range Status   SARS Coronavirus 2 by RT PCR NEGATIVE NEGATIVE Final    Comment: (NOTE) SARS-CoV-2 target nucleic acids are NOT DETECTED.  The SARS-CoV-2 RNA is generally detectable in upper respiratory specimens during the acute phase of infection. The lowest concentration of SARS-CoV-2 viral copies this assay can detect is 138 copies/mL. A negative result does not preclude SARS-Cov-2 infection and should not be used as the sole basis for treatment or other patient management decisions. A negative result may occur with  improper specimen collection/handling, submission of specimen other than nasopharyngeal swab, presence of viral mutation(s) within the areas targeted by this assay, and inadequate number of viral copies(<138 copies/mL). A negative result must be combined with clinical observations, patient history, and epidemiological information. The expected result is Negative.  Fact Sheet for Patients:  EntrepreneurPulse.com.au  Fact Sheet for Healthcare Providers:  IncredibleEmployment.be  This test is no t yet approved or cleared by the [place] FDA and  has been authorized for detection and/or diagnosis of SARS-CoV-2 by FDA under an Emergency Use Authorization (EUA). This EUA will remain  in effect (meaning this test can be used) for the duration of the COVID-19 declaration under Section 564(b)(1) of the Act, 21 U.S.C.section 360bbb-3(b)(1), unless the authorization is terminated  or revoked sooner.       Influenza A by PCR NEGATIVE NEGATIVE Final   Influenza B by PCR NEGATIVE NEGATIVE Final    Comment: (NOTE) The Xpert Xpress SARS-CoV-2/FLU/RSV  plus assay is intended as an aid in the diagnosis of influenza from Nasopharyngeal swab specimens and should not be used as a sole basis for treatment. Nasal washings and aspirates are unacceptable for Xpert Xpress SARS-CoV-2/FLU/RSV testing.  Fact Sheet for Patients: EntrepreneurPulse.com.au  Fact Sheet for Healthcare Providers: IncredibleEmployment.be  This test is not yet approved or cleared by the [place] FDA and has been authorized for detection and/or diagnosis of SARS-CoV-2 by FDA under an Emergency Use Authorization (EUA). This EUA will remain in effect (meaning this test can be used) for the duration of the COVID-19 declaration under Section 564(b)(1) of the Act, 21 U.S.C. section 360bbb-3(b)(1), unless the authorization is terminated or revoked.  Performed at [facility], [place] [address], [place], [place] [address]      Time coordinating discharge: 35mins  SIGNED:   Jolaine Artist  Roderic Palau, MD  [facility] 03/03/2021, 12:50 PM   If 7PM-7AM, please contact night-coverage [email or web address]

## 2021-03-03 NOTE — Progress Notes (Signed)
Jamison Neighbor Danish to be D/C'd  per MD order. Discussed with the patient and all questions fully answered.  VSS, Skin clean, dry and intact without evidence of skin break down, no evidence of skin tears noted.  IV catheter discontinued intact. Site without signs and symptoms of complications. Dressing and pressure applied.  An After Visit Summary was printed and given to the patient. Patient received prescription.  D/c education completed with patient/family including follow up instructions, medication list, d/c activities limitations if indicated, with other d/c instructions as indicated by MD - patient able to verbalize understanding, all questions fully answered.   Patient instructed to return to ED, call 911, or call MD for any changes in condition.   Patient to be escorted via [place], and D/C home via private auto.

## 2021-03-03 NOTE — Progress Notes (Signed)
Occupational Therapy Treatment and Discharge Patient Details Name: Samantha Evans MRN: [id number] DOB: Oct 29, 1961 Today's Date: 03/03/2021    History of present illness Patient is a 60 y/o female who presents on 02/27/21 with N/V and pain from umbilical hernia. 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 PMH: HTN   OT comments  Pt has met all acute OT goals at this time. During this session pt was educated on energy conservation techniques she can use in her home with dressing, bathing, and some IADL's, as well as fall prevention techniques. Pt asked appropriate questions and verbalized understanding of all items discussed. Pt demonstrated mod I with all functional mobility and grooming in standing, pt close to baseline, limited by pain at this time. Pt will be discharged from acute OT at this time.    Follow Up Recommendations       Equipment Recommendations  None recommended by OT    Recommendations for Other Services      Precautions / Restrictions Precautions Precautions: Other (comment);Fall Precaution Comments: abdominal incision Required Braces or Orthoses: Other Brace Other Brace: abdominal binder for OOB Restrictions Weight Bearing Restrictions: No       Mobility Bed Mobility               General bed mobility comments: not assessed this session, up in chair pre-post    Transfers Overall transfer level: Modified independent Equipment used: None Transfers: Sit to/from Stand Sit to Stand: Modified independent (Device/Increase time)         General transfer comment: from chair, no AD, no LOB    Balance Overall balance assessment: Independent Sitting-balance support: Feet supported;No upper extremity supported Sitting balance-Leahy Scale: Good     Standing balance support: No upper extremity supported;During  functional activity Standing balance-Leahy Scale: Good Standing balance comment: see DGI; head turns, obstacles without LOB and no AD                 Standardized Balance Assessment Standardized Balance Assessment : Dynamic Gait Index   Dynamic Gait Index Level Surface: Normal Change in Gait Speed: Normal Gait with Horizontal Head Turns: Mild Impairment Gait with Vertical Head Turns: Normal Gait and Pivot Turn: Normal Step Over Obstacle: Mild Impairment Step Around Obstacles: Normal Steps: Moderate Impairment Total Score: 20     ADL either performed or assessed with clinical judgement   ADL Overall ADL's : Modified independent Eating/Feeding: Independent;Sitting   Grooming: Wash/dry hands;Wash/dry face;Modified independent;Standing   Upper Body Bathing: Modified independent;Sitting   Lower Body Bathing: Supervison/ safety;Sit to/from stand   Upper Body Dressing : Modified independent;Sitting   Lower Body Dressing: Modified independent;Sitting/lateral leans;Sit to/from stand               Functional mobility during ADLs: Modified independent General ADL Comments: Pt mainly back to baseline with ADL's, limited by pain with certain movements and moving slower than her reported normal.     Vision       Perception     Praxis      Cognition Arousal/Alertness: Awake/alert Behavior During Therapy: WFL for tasks assessed/performed Overall Cognitive Status: Within Functional Limits for tasks assessed                                 General Comments: poor dentition so at times speech difficult to  understand but follows 1 and 2-step commands well.        Exercises Exercises: General Lower Extremity General Exercises - Lower Extremity Ankle Circles/Pumps: AROM;Both;5 reps;Seated Long Arc Quad: AROM;Both;5 reps;Seated Hip ABduction/ADduction:  (verbal/visual review (pt seated in chair)) Straight Leg Raises:  (verbal/visual review (pt seated in  chair))   Shoulder Instructions       General Comments Pt experiences mild loss of breath with functional mobility, however once seated pt is able to regain her breath.    Pertinent Vitals/ Pain       Pain Assessment: 0-10 Pain Score: 5  Faces Pain Scale: No hurt Pain Location: abdomen Pain Descriptors / Indicators: Sore Pain Intervention(s): Monitored during session;Repositioned  Home Living                                          Prior Functioning/Environment              Frequency           Progress Toward Goals  OT Goals(current goals can now be found in the care plan section)  Progress towards OT goals: Goals met/education completed, patient discharged from OT  Acute Rehab OT Goals Patient Stated Goal: keep moving and feel better OT Goal Formulation: With patient Time For Goal Achievement: 03/15/21 Potential to Achieve Goals: Good ADL Goals Pt Will Perform Lower Body Bathing: Independently;sit to/from stand Pt Will Perform Lower Body Dressing: Independently;sit to/from stand Pt Will Transfer to Toilet: Independently;ambulating;regular height toilet Pt Will Perform Toileting - Clothing Manipulation and hygiene: Independently;sit to/from stand  Plan All goals met and education completed, patient discharged from OT services    Co-evaluation                 AM-PAC OT "6 Clicks" Daily Activity     Outcome Measure   Help from another person eating meals?: None Help from another person taking care of personal grooming?: None Help from another person toileting, which includes using toliet, bedpan, or urinal?: None Help from another person bathing (including washing, rinsing, drying)?: None Help from another person to put on and taking off regular upper body clothing?: None Help from another person to put on and taking off regular lower body clothing?: None 6 Click Score: 24    End of Session    OT Visit Diagnosis: Unsteadiness  on feet (R26.81);Muscle weakness (generalized) (M62.81)   Activity Tolerance Patient tolerated treatment well   Patient Left in chair;with call bell/phone within reach   Nurse Communication Mobility status        Time: 1610-9604 OT Time Calculation (min): 18 min  Charges: OT General Charges $OT Visit: 1 Visit OT Treatments $Self Care/Home Management : 8-22 mins  Ashden Sonnenberg H., OTR/L Acute Rehabilitation  Gorman Safi Elane Yolanda Bonine 03/03/2021, 1:59 PM

## 2021-03-03 NOTE — Discharge Instructions (Signed)
[facility] _______[facility] ° °UMBILICAL  HERNIA REPAIR: POST OP INSTRUCTIONS ° °Always review your discharge instruction sheet given to you by the facility where your surgery was performed. °IF YOU HAVE DISABILITY OR FAMILY LEAVE FORMS, YOU MUST BRING THEM TO THE OFFICE FOR PROCESSING.   °DO NOT GIVE THEM TO YOUR DOCTOR. ° °1. A  prescription for pain medication may be given to you upon discharge.  Take your pain medication as prescribed, if needed.  If narcotic pain medicine is not needed, then you may take acetaminophen (Tylenol) or ibuprofen (Advil) as needed. °2. Take your usually prescribed medications unless otherwise directed. °If you need a refill on your pain medication, please contact your pharmacy.  They will contact our office to request authorization. Prescriptions will not be filled after 5 pm or on week-ends. °3. You should follow a light diet the first 24 hours after arrival home, such as soup and crackers, etc.  Be sure to include lots of fluids daily.  Resume your normal diet the day after surgery. °4.Most patients will experience some swelling and bruising around the umbilicus or in the groin and scrotum.  Ice packs and reclining will help.  Swelling and bruising can take several days to resolve.  °6. It is common to experience some constipation if taking pain medication after surgery.  Increasing fluid intake and taking a stool softener (such as Colace) will usually help or prevent this problem from occurring.  A mild laxative (Milk of Magnesia or Miralax) should be taken according to package directions if there are no bowel movements after 48 hours. °7. Unless discharge instructions indicate otherwise, you may remove your bandages 24-48 hours after surgery, and you may shower at that time.  You may have steri-strips (small skin tapes) in place directly over the incision.  These strips should be left on the skin for 7-10 days.  If your surgeon used skin glue on the incision, you may  shower in 24 hours.  The glue will flake off over the next 2-3 weeks.  Any sutures or staples will be removed at the office during your follow-up visit. °8. ACTIVITIES:  You may resume regular (light) daily activities beginning the next day--such as daily self-care, walking, climbing stairs--gradually increasing activities as tolerated.  You may have sexual intercourse when it is comfortable.  Refrain from any heavy lifting or straining until approved by your doctor. ° °a.You may drive when you are no longer taking prescription pain medication, you can comfortably wear a seatbelt, and you can safely maneuver your car and apply brakes. °b.RETURN TO WORK:   °_____________________________________________ ° °9.You should see your doctor in the office for a follow-up appointment approximately 2-3 weeks after your surgery.  Make sure that you call for this appointment within a day or two after you arrive home to insure a convenient appointment time. °10.OTHER INSTRUCTIONS: _________________________ °   _____________________________________ ° °WHEN TO CALL YOUR DOCTOR: °1. Fever over 101.0 °2. Inability to urinate °3. Nausea and/or vomiting °4. Extreme swelling or bruising °5. Continued bleeding from incision. °6. Increased pain, redness, or drainage from the incision ° °The clinic staff is available to answer your questions during regular business hours.  Please don’t hesitate to call and ask to speak to one of the nurses for clinical concerns.  If you have a medical emergency, go to the nearest emergency room or call 911.  A surgeon from [facility] is always on call at the hospital ° ° °[address], [address], [place], [place]  [address] ? ° [address] [address], [place], [place]   [address] °[phone] ? [phone] ? FAX [phone] °Web site: [email or web address] ° °

## 2021-03-04 ENCOUNTER — Telehealth: Payer: Self-pay

## 2021-03-04 NOTE — Telephone Encounter (Signed)
Transition Care Management Unsuccessful Follow-up Telephone Call  Date of discharge and from where:  03/03/2021 from [facility]  Attempts:  1st Attempt  Reason for unsuccessful TCM follow-up call:  Unable to leave message

## 2021-03-04 NOTE — Discharge Summary (Signed)
Physician Discharge Summary  Samantha Evans WCB:[id number] DOB: February 03, 1961 DOA: 02/27/2021  PCP: Pcp, No Patient has an appointment with [facility] on 5/18  Admit date: 02/27/2021 Discharge date: 03/03/2021  Admitted From: home Disposition:  home  Recommendations for Outpatient Follow-up:  1. Follow up with PCP in 1-2 weeks 2. Please obtain BMP/CBC in one week 3. Follow up with Gen surg as scheduled on 5/3 4. Referral made to gastroenterology on 4/21 to discuss cirrhosis   Discharge Condition:stable CODE STATUS:full code Diet recommendation: heart healthy  Brief/Interim Summary: 60 year old female admitted to the hospital with nausea, vomiting and was found to have incarcerated umbilical hernia with small bowel obstruction.  She was admitted for operative management.  Incidental finding of underlying cirrhosis noted on CT imaging.  Discharge Diagnoses:  Active Problems:   Abdominal pain   Incarcerated umbilical hernia   Small bowel obstruction (HCC)   Hepatic cirrhosis ([place])- by CT scan   AKI (acute kidney injury) ([place])  Small bowel obstruction due to incarcerated umbilical hernia -Status post operative repair on 4/14 -NG tube removed 4/15 -currently on regular diet -She is passing gas and tolerating diet -encouraged to ambulate -will provide miralax on discharge -cleared by surgery for discharge  Cirrhosis -No significant alcohol history -Viral panel positive for HCV ab, quantitative RNA pending -referral to [facility] made to discuss further work up/treatment options -Noted to have 2.1 cm fluid density in liver on CT, This was not appreciated on MRI  Thrombocytopenia -Suspect this is related to underlying liver disease -No signs of bleeding at present -Continue to monitor  Discharge Instructions  Discharge Instructions    Ambulatory referral to Gastroenterology   Complete by: As directed    What is the reason for referral?: Other  Comment - new diagnosis of HepC cirrhosis   Diet - low sodium heart healthy   Complete by: As directed    Discharge wound care:   Complete by: As directed    Keep incisions clean and dry   Increase activity slowly   Complete by: As directed      Allergies as of 03/03/2021   No Known Allergies     Medication List    STOP taking these medications   naproxen 500 MG tablet Commonly known as: NAPROSYN     TAKE these medications   docusate sodium 100 MG capsule Commonly known as: COLACE Take 1 capsule (100 mg total) by mouth 2 (two) times daily.   pantoprazole 40 MG tablet Commonly known as: PROTONIX Take 1 tablet (40 mg total) by mouth 2 (two) times daily.   polyethylene glycol 17 g packet Commonly known as: MIRALAX / GLYCOLAX Take 17 g by mouth daily as needed for mild constipation.   traMADol 50 MG tablet Commonly known as: ULTRAM Take 1 tablet (50 mg total) by mouth every 6 (six) hours as needed for severe pain.            Discharge Care Instructions  (From admission, onward)         Start     Ordered   03/03/21 0000  Discharge wound care:       Comments: Keep incisions clean and dry   03/03/21 1223          Follow-up [facility] Go on 03/18/2021.   Specialty: General Surgery Why: Your appointment is 5/3 at 11:15am Please arrive 30 minutes prior to your appointment to check in and fill  out paperwork. Bring photo ID and insurance information. Contact information: [address] [place] [phone]       Alfredia Ferguson, PA-C Follow up on 03/06/2021.   Specialty: Gastroenterology Why: 10:30am to discuss liver disease Contact information: [place] [address] [phone]              No Known Allergies  Consultations:  Gen surgery   Procedures/Studies: CT ABDOMEN PELVIS W CONTRAST  Result Date: 02/27/2021 CLINICAL DATA:  Abdominal pain.   Incarcerated hernia suspected. EXAM: CT ABDOMEN AND PELVIS WITH CONTRAST TECHNIQUE: Multidetector CT imaging of the abdomen and pelvis was performed using the standard protocol following bolus administration of intravenous contrast. CONTRAST:  131mL OMNIPAQUE IOHEXOL 300 MG/ML  SOLN COMPARISON:  CT abdomen pelvis 02/23/2010 FINDINGS: Lower chest: No acute abnormality. Hepatobiliary: Nodular hepatic contour with an enlarged caudate lobe. Several hypodense lesions within the liver are smaller than 1 cm in too small to characterize. There is a fluid density lesion within the right hepatic lobe measuring up to 2.1 cm (3:14). Status post cholecystectomy. No biliary dilatation. Pancreas: No focal lesion. Normal pancreatic contour. No surrounding inflammatory changes. No main pancreatic ductal dilatation. Spleen: The spleen is enlarged measuring up to 14 cm. No focal abnormality. Adrenals/Urinary Tract: No adrenal nodule bilaterally. Bilateral kidneys enhance symmetrically. Subcentimeter hypodensities are too small to characterize. No hydronephrosis. No hydroureter. The urinary bladder is unremarkable. Stomach/Bowel: Proximal to mid small bowel is dilated with fluid. There is a transition point at the entrance of the small bowel into an umbilical hernia (9:67). A short segment of small bowel is noted to be dilated with fluid within the umbilical hernia with findings suggestive of a closed loop obstruction at the entry and exit sites. Associated bowel wall thickening of this short segment of bowel is noted. No definite pneumatosis. Question trace free fluid surrounding this bowel loop. The remainder of the distal small bowel is completely collapsed. Small amount of stool is noted within the colon. Scattered sigmoid diverticulosis. No large bowel dilatation or bowel wall thickening. The appendix is not definitely identified. Vascular/Lymphatic: Trace venous collateral are noted within the upper abdomen. The portal, splenic,  superior mesenteric veins are patent. No abdominal aorta or iliac aneurysm. At least moderate atherosclerotic plaque of the aorta and its branches. No abdominal, pelvic, or inguinal lymphadenopathy. Reproductive: Uterus and bilateral adnexa are unremarkable. Other: Trace simple free fluid. No intraperitoneal free gas. No organized fluid collection. Musculoskeletal: Periumbilical hernia with a short loop of small bowel noted and described above. Superiorly there is likely a tiny fat containing ventral wall hernia with an abdominal defect that is difficult to measure considering it's small size (7:63, 3:40). Inferiorly there is another tiny fat and fluid containing ventral wall hernia with an abdominal defect of 2 mm (7:70, 3:57). No suspicious lytic or blastic osseous lesions. No acute displaced fracture. Multilevel degenerative changes of the spine. IMPRESSION: 1. Small-bowel obstruction with transition point at the entry site of an umbilical hernia. Associated closed loop obstruction of a short segment of small bowel within the umbilical hernia. Concern for ischemia and incarceration of the bowel loop within the hernia. Recommend emergent surgical consultation. 2. Cirrhosis with portal hypertension. A 2.1cm indetemriante fluid density lesion with otherwise markedly limited evaluation for focal hepatic lesion on this single phase portal venous study. Recommend MRI liver protocol for further evaluation. 3. Couple of other tiny ventral wall hernia containing fat and free fluid.  4. Scattered sigmoid diverticulosis with no acute diverticulitis. 5. Trace ascites. These results were called by telephone at the time of interpretation on 02/27/2021 at 4:29 pm to provider Dr. Nanda Quinton, who verbally acknowledged these results. Electronically Signed   By: Iven Finn M.D.   On: 02/27/2021 16:38   MR LIVER W WO CONTRAST  Result Date: 03/01/2021 CLINICAL DATA:  Cirrhosis.  Indeterminate liver lesion on CT. EXAM: MRI  ABDOMEN WITHOUT AND WITH CONTRAST TECHNIQUE: Multiplanar multisequence MR imaging of the abdomen was performed both before and after the administration of intravenous contrast. CONTRAST:  7.30mL GADAVIST GADOBUTROL 1 MMOL/ML IV SOLN COMPARISON:  CTs of 02/27/2021 and 02/23/2010. FINDINGS: Lower chest: Normal heart size without pericardial or pleural effusion. Hepatobiliary: Marked cirrhosis. Multiple foci of hyperenhancement on series 16. These measure on the order of maximally 6 mm in the hepatic dome on 15/15. None demonstrate delayed washout or other suspicious characteristics. Corresponding to the CT abnormality is a simple cyst within the central left hepatic lobe 1.8 cm. Other smaller hepatic cysts are seen. Intrahepatic ducts are upper normal for prior cholecystectomy. There may also be peribiliary cysts. No common duct dilatation or choledocholithiasis. Pancreas:  Normal, without mass or ductal dilatation. Spleen:  Borderline splenomegaly at 13.7 cm craniocaudal. Adrenals/Urinary Tract: Normal adrenal glands. Bilateral tiny renal cysts. No hydronephrosis. Stomach/Bowel: Normal stomach and abdominal bowel loops. Vascular/Lymphatic: Aortic atherosclerosis. Patent portal splenic and hepatic veins. Enlarged portal vein, suggesting portal venous hypertension. No retroperitoneal or retrocrural adenopathy. Other:  No ascites. Musculoskeletal: No acute osseous abnormality. IMPRESSION: 1. Cirrhosis and portal venous hypertension. 2. No evidence of hepatocellular carcinoma. Multiple arterial phase foci of hyperenhancement are likely perfusion anomalies. LR 3. This could be re-evaluated with follow-up MRI at 6 months. 3.  Aortic Atherosclerosis (ICD10-I70.0). Electronically Signed   By: Abigail Miyamoto M.D.   On: 03/01/2021 12:49   DG Abd Portable 1V  Result Date: 02/28/2021 CLINICAL DATA:  NG tube placement EXAM: PORTABLE ABDOMEN - 1 VIEW COMPARISON:  None. FINDINGS: Tip and side port of the esophageal catheter  project within the stomach. IMPRESSION: Esophageal catheter tip in the stomach. Electronically Signed   By: Ulyses Jarred M.D.   On: 02/28/2021 01:40      Subjective: No abd pain. No vomiting. Passing gas, tolerating diet  Discharge Exam: Vitals:   03/02/21 1458 03/02/21 1954 03/03/21 0426 03/03/21 0500  BP: (!) 145/69 (!) 147/66 (!) 147/70   Pulse: 70 68 74   Resp: 16 18 17    Temp: 98.6 F (37 C) 98.5 F (36.9 C) 98.6 F (37 C)   TempSrc: Oral Oral Oral   SpO2: 96% 100% 94%   Weight:    80.3 kg  Height:        General: Pt is alert, awake, not in acute distress Cardiovascular: RRR, S1/S2 +, no rubs, no gallops Respiratory: CTA bilaterally, no wheezing, no rhonchi Abdominal: Soft, NT, ND, bowel sounds + Extremities: no edema, no cyanosis    The results of significant diagnostics from this hospitalization (including imaging, microbiology, ancillary and laboratory) are listed below for reference.     Microbiology: Recent Results (from the past 240 hour(s))  Resp Panel by RT-PCR (Flu A&B, Covid) Nasopharyngeal Swab     Status: None   Collection Time: 02/27/21  4:57 PM   Specimen: Nasopharyngeal Swab; Nasopharyngeal(NP) swabs in vial transport medium  Result Value Ref Range Status   SARS Coronavirus 2 by RT PCR NEGATIVE NEGATIVE Final    Comment: (NOTE)  SARS-CoV-2 target nucleic acids are NOT DETECTED.  The SARS-CoV-2 RNA is generally detectable in upper respiratory specimens during the acute phase of infection. The lowest concentration of SARS-CoV-2 viral copies this assay can detect is 138 copies/mL. A negative result does not preclude SARS-Cov-2 infection and should not be used as the sole basis for treatment or other patient management decisions. A negative result may occur with  improper specimen collection/handling, submission of specimen other than nasopharyngeal swab, presence of viral mutation(s) within the areas targeted by this assay, and inadequate number  of viral copies(<138 copies/mL). A negative result must be combined with clinical observations, patient history, and epidemiological information. The expected result is Negative.  Fact Sheet for Patients:  EntrepreneurPulse.com.au  Fact Sheet for Healthcare Providers:  IncredibleEmployment.be  This test is no t yet approved or cleared by the [place] FDA and  has been authorized for detection and/or diagnosis of SARS-CoV-2 by FDA under an Emergency Use Authorization (EUA). This EUA will remain  in effect (meaning this test can be used) for the duration of the COVID-19 declaration under Section 564(b)(1) of the Act, 21 U.S.C.section 360bbb-3(b)(1), unless the authorization is terminated  or revoked sooner.       Influenza A by PCR NEGATIVE NEGATIVE Final   Influenza B by PCR NEGATIVE NEGATIVE Final    Comment: (NOTE) The Xpert Xpress SARS-CoV-2/FLU/RSV plus assay is intended as an aid in the diagnosis of influenza from Nasopharyngeal swab specimens and should not be used as a sole basis for treatment. Nasal washings and aspirates are unacceptable for Xpert Xpress SARS-CoV-2/FLU/RSV testing.  Fact Sheet for Patients: EntrepreneurPulse.com.au  Fact Sheet for Healthcare Providers: IncredibleEmployment.be  This test is not yet approved or cleared by the [place] FDA and has been authorized for detection and/or diagnosis of SARS-CoV-2 by FDA under an Emergency Use Authorization (EUA). This EUA will remain in effect (meaning this test can be used) for the duration of the COVID-19 declaration under Section 564(b)(1) of the Act, 21 U.S.C. section 360bbb-3(b)(1), unless the authorization is terminated or revoked.  Performed at [facility], [place] [address], [place], [place] [address]      Labs: BNP (last 3 results) No results for input(s): BNP in the last 8760 hours. Basic Metabolic  Panel: Recent Labs  Lab 02/27/21 1345 02/28/21 0128 03/01/21 0042  NA 136 133* 134*  K 4.1 4.1 3.8  CL 106 108 106  CO2 22 21* 25  GLUCOSE 107* 152* 104*  BUN 25* 25* 24*  CREATININE 1.18* 1.06* 0.97  CALCIUM 10.4* 9.8 10.1   Liver Function Tests: Recent Labs  Lab 02/27/21 1345 02/28/21 0128  AST 105* 81*  ALT 72* 63*  ALKPHOS 94 89  BILITOT 2.8* 1.5*  PROT 7.3 6.9  ALBUMIN 3.0* 2.6*   Recent Labs  Lab 02/27/21 1345  LIPASE 37   No results for input(s): AMMONIA in the last 168 hours. CBC: Recent Labs  Lab 02/27/21 1345 02/28/21 0128 03/01/21 0042  WBC 6.2 6.0 9.9  NEUTROABS 4.5  --   --   HGB 12.6 11.2* 10.4*  HCT 36.5 32.7* 29.9*  MCV 96.1 97.6 96.5  PLT 135* 88* 97*   Cardiac Enzymes: No results for input(s): CKTOTAL, CKMB, CKMBINDEX, TROPONINI in the last 168 hours. BNP: Invalid input(s): POCBNP CBG: No results for input(s): GLUCAP in the last 168 hours. D-Dimer No results for input(s): DDIMER in the last 72 hours. Hgb A1c No results for input(s): HGBA1C in the last  72 hours. Lipid Profile No results for input(s): CHOL, HDL, LDLCALC, TRIG, CHOLHDL, LDLDIRECT in the last 72 hours. Thyroid function studies No results for input(s): TSH, T4TOTAL, T3FREE, THYROIDAB in the last 72 hours.  Invalid input(s): FREET3 Anemia work up No results for input(s): VITAMINB12, FOLATE, FERRITIN, TIBC, IRON, RETICCTPCT in the last 72 hours. Urinalysis    Component Value Date/Time   COLORURINE AMBER (A) 02/23/2021 1013   APPEARANCEUR HAZY (A) 02/23/2021 1013   LABSPEC 1.019 02/23/2021 1013   PHURINE 6.0 02/23/2021 1013   GLUCOSEU NEGATIVE 02/23/2021 1013   HGBUR MODERATE (A) 02/23/2021 1013   BILIRUBINUR NEGATIVE 02/23/2021 1013   KETONESUR NEGATIVE 02/23/2021 1013   PROTEINUR 100 (A) 02/23/2021 1013   UROBILINOGEN 1.0 02/23/2010 2204   NITRITE NEGATIVE 02/23/2021 1013   LEUKOCYTESUR NEGATIVE 02/23/2021 1013   Sepsis Labs Invalid input(s): PROCALCITONIN,   WBC,  LACTICIDVEN Microbiology Recent Results (from the past 240 hour(s))  Resp Panel by RT-PCR (Flu A&B, Covid) Nasopharyngeal Swab     Status: None   Collection Time: 02/27/21  4:57 PM   Specimen: Nasopharyngeal Swab; Nasopharyngeal(NP) swabs in vial transport medium  Result Value Ref Range Status   SARS Coronavirus 2 by RT PCR NEGATIVE NEGATIVE Final    Comment: (NOTE) SARS-CoV-2 target nucleic acids are NOT DETECTED.  The SARS-CoV-2 RNA is generally detectable in upper respiratory specimens during the acute phase of infection. The lowest concentration of SARS-CoV-2 viral copies this assay can detect is 138 copies/mL. A negative result does not preclude SARS-Cov-2 infection and should not be used as the sole basis for treatment or other patient management decisions. A negative result may occur with  improper specimen collection/handling, submission of specimen other than nasopharyngeal swab, presence of viral mutation(s) within the areas targeted by this assay, and inadequate number of viral copies(<138 copies/mL). A negative result must be combined with clinical observations, patient history, and epidemiological information. The expected result is Negative.  Fact Sheet for Patients:  EntrepreneurPulse.com.au  Fact Sheet for Healthcare Providers:  IncredibleEmployment.be  This test is no t yet approved or cleared by the [place] FDA and  has been authorized for detection and/or diagnosis of SARS-CoV-2 by FDA under an Emergency Use Authorization (EUA). This EUA will remain  in effect (meaning this test can be used) for the duration of the COVID-19 declaration under Section 564(b)(1) of the Act, 21 U.S.C.section 360bbb-3(b)(1), unless the authorization is terminated  or revoked sooner.       Influenza A by PCR NEGATIVE NEGATIVE Final   Influenza B by PCR NEGATIVE NEGATIVE Final    Comment: (NOTE) The Xpert Xpress SARS-CoV-2/FLU/RSV  plus assay is intended as an aid in the diagnosis of influenza from Nasopharyngeal swab specimens and should not be used as a sole basis for treatment. Nasal washings and aspirates are unacceptable for Xpert Xpress SARS-CoV-2/FLU/RSV testing.  Fact Sheet for Patients: EntrepreneurPulse.com.au  Fact Sheet for Healthcare Providers: IncredibleEmployment.be  This test is not yet approved or cleared by the [place] FDA and has been authorized for detection and/or diagnosis of SARS-CoV-2 by FDA under an Emergency Use Authorization (EUA). This EUA will remain in effect (meaning this test can be used) for the duration of the COVID-19 declaration under Section 564(b)(1) of the Act, 21 U.S.C. section 360bbb-3(b)(1), unless the authorization is terminated or revoked.  Performed at [facility], [place] [address], [place], [place] [address]      Time coordinating discharge: 52mins  SIGNED:   Kathie Dike,  MD  [facility] 03/04/2021, 6:02 PM   If 7PM-7AM, please contact night-coverage [email or web address]

## 2021-03-05 ENCOUNTER — Encounter (HOSPITAL_COMMUNITY): Payer: Self-pay | Admitting: General Surgery

## 2021-03-05 NOTE — Telephone Encounter (Signed)
Transition Care Management Unsuccessful Follow-up Telephone Call  Date of discharge and from where:  03/03/2021 from [facility]  Attempts:  2nd Attempt  Reason for unsuccessful TCM follow-up call:  Unable to leave message

## 2021-03-06 ENCOUNTER — Encounter: Payer: Self-pay | Admitting: Physician Assistant

## 2021-03-06 ENCOUNTER — Ambulatory Visit (INDEPENDENT_AMBULATORY_CARE_PROVIDER_SITE_OTHER): Payer: Medicaid Other | Admitting: Physician Assistant

## 2021-03-06 ENCOUNTER — Other Ambulatory Visit: Payer: Medicaid Other

## 2021-03-06 VITALS — BP 148/64 | HR 80 | Ht 61.0 in | Wt 184.2 lb

## 2021-03-06 DIAGNOSIS — B1920 Unspecified viral hepatitis C without hepatic coma: Secondary | ICD-10-CM

## 2021-03-06 DIAGNOSIS — K7460 Unspecified cirrhosis of liver: Secondary | ICD-10-CM

## 2021-03-06 DIAGNOSIS — Z1211 Encounter for screening for malignant neoplasm of colon: Secondary | ICD-10-CM

## 2021-03-06 MED ORDER — NA SULFATE-K SULFATE-MG SULF 17.5-3.13-1.6 GM/177ML PO SOLN: 1.0000 | Freq: Once | ORAL | 0 refills | Status: AC

## 2021-03-06 NOTE — Telephone Encounter (Signed)
Transition Care Management Unsuccessful Follow-up Telephone Call  Date of discharge and from where:  03/03/2021 from [facility]  Attempts:  3rd Attempt  Reason for unsuccessful TCM follow-up call:  Unable to reach patient

## 2021-03-06 NOTE — Progress Notes (Signed)
Subjective:    Patient ID: Samantha Evans, female    DOB: [birth year], 60 y.o.   MRN: [id number]  HPI Samantha is a pleasant 60 year old white female, new to GI today referred by Samantha Evans/Samantha Evans for evaluation of new diagnosis of cirrhosis/hepatitis C.  Patient was not seen by GI during recent admission 4/14 through 03/03/2021 at which time she was admitted with an incarcerated umbilical hernia, and underwent umbilical hernia repair. CT imaging on admission had shown a cirrhotic liver and a 2.1 cm fluid density in the right hepatic lobe, she is status post cholecystectomy, noted to have an enlarged spleen at 14 cm and trace ascites as well as scattered diverticulosis. MRI of the liver was done on 03/01/2021 again showing cirrhosis and portal hypertension, no evidence of hepatocellular carcinoma.  She was noted to have multiple arterial phase foci of hyper enhancement likely a perfusion anomaly and follow-up MRI was to be considered in 6 months. Labs showed a T bili 1.5/alk phos 85/AST 81/ALT 63 Creatinine 1.16 INR 1.3 pro time 16 Hemoglobin 11.2/hematocrit 32.7 platelets 88. Hep C antibody was positive and hep C RNA quant 954,000  Patient says she had never been told in the past that she had any liver issues, and has not had any prior GI evaluation.  She does not currently have a PCP. She says in the past she used to donate plasma and her ex-husband to use to donate plasma with her was "banned" from the plasma donations.  She does not know exactly why but says that he had been an intravenous drug user.  She says that she had never used IV drugs herself  used to use crack.  Cocaine. She does not have any history of regular EtOH use, no family history of liver disease that she is aware of.  She is recovering from her umbilical hernia repair but is still quite sore, having just been discharged from the hospital about 3 days ago.  Review of Systems Pertinent positive and negative  review of systems were noted in the above HPI section.  All other review of systems was otherwise negative.  Outpatient Encounter Medications as of 03/06/2021  Medication Sig  . docusate sodium (COLACE) 100 MG capsule Take 1 capsule (100 mg total) by mouth 2 (two) times daily.  . Na Sulfate-K Sulfate-Mg Sulf 17.5-3.13-1.6 GM/177ML SOLN Take 1 kit by mouth once for 1 dose.  . pantoprazole (PROTONIX) 40 MG tablet Take 1 tablet (40 mg total) by mouth 2 (two) times daily.  . polyethylene glycol (MIRALAX / GLYCOLAX) 17 g packet Take 17 g by mouth daily as needed for mild constipation.  . traMADol (ULTRAM) 50 MG tablet Take 1 tablet (50 mg total) by mouth every 6 (six) hours as needed for severe pain.   No facility-administered encounter medications on file as of 03/06/2021.   No Known Allergies Patient Active Problem List   Diagnosis Date Noted  . Abdominal pain 02/27/2021  . Incarcerated umbilical hernia 34/28/7681  . Small bowel obstruction (Samantha Evans) 02/27/2021  . Hepatic cirrhosis (Samantha Evans)- by CT scan 02/27/2021  . AKI (acute kidney injury) (Samantha Evans) 02/27/2021   Social History   Socioeconomic History  . Marital status: Legally Separated    Spouse name: Not on file  . Number of children: Not on file  . Years of education: Not on file  . Highest education level: Not on file  Occupational History  . Not on file  Tobacco Use  . Smoking status:  Former Smoker    Packs/day: 0.50    Years: 40.00    Pack years: 20.00    Types: Cigarettes    Quit date: 02/27/2021    Years since quitting: 0.0  . Smokeless tobacco: Never Used  Substance and Sexual Activity  . Alcohol use: Not Currently  . Drug use: Not on file  . Sexual activity: Not on file  Other Topics Concern  . Not on file  Social History Narrative  . Not on file   Social Determinants of Health   Financial Resource Strain: Not on file  Food Insecurity: Not on file  Transportation Needs: Not on file  Physical Activity: Not on file   Stress: Not on file  Social Connections: Not on file  Intimate Partner Violence: Not on file    Samantha Evans's family history includes Lung cancer in her father.      Objective:    Vitals:   03/06/21 1033  BP: (!) 148/64  Pulse: 80    Physical Exam Well-developed older white female in no acute distress.  Height, Weight, 184 BMI 34.8  HEENT; nontraumatic normocephalic, EOMI, PE R LA, sclera anicteric. Oropharynx; not examined today Neck; supple, no JVD Cardiovascular; regular rate and rhythm with S1-S2, no murmur rub or gallop Pulmonary; Clear bilaterally Abdomen; soft, obese, no appreciable fluid wave, she has a healing periumbilical incisional wound nondistended, liver is palpable 2 to 3 fingerbreadths below the right costal margin Rectal; not examined today Skin; benign exam, no jaundice rash or appreciable lesions Extremities; 1+ edema bilateral lower extremities, she has scattered spider telangiectasia Neuro/Psych; alert and oriented x4, grossly nonfocal mood and affect appropriate, no asterixis       Assessment & Plan:   #54 60 year old white female with new diagnosis of cirrhosis and active hepatitis C. Decompensated liver disease with M ELD of 11.  No history of EtOH use, no family history of liver disease known hepatitis B serology negative  #2 abnormal MRI of the liver 02/27/2021 with multiple arterial phase foci of hyperenhancement likely representing perfusion anomaly, no evidence for Samantha Evans, recommended follow-up MRI 6 months #3 diverticulosis #4 s/p cholecystectomy #5 status post repair of incarcerated umbilical hernia 2/95/1884 #6 colon cancer screening-no prior colonoscopy  Plan; Patient will be referred to infectious disease for treatment of hepatitis C Discussed importance of continued 2 g sodium diet Check AFP, venous ammonia, will also complete work-up with autoimmune and inheritable markers Patient will be scheduled for upper endoscopy and colonoscopy  with Samantha Evans.  These will intentionally be scheduled about 8 weeks out to allow time for complete healing of her umbilical hernia incision.  Both procedures were discussed in detail with patient including indications risks and benefits and she is agreeable to proceed. Continue Protonix 40 mg once daily for now Consider follow-up MRI of the liver in 6 to 8 months We will plan office follow-up in 4 to 5 weeks, and repeat labs at that time. Patient will be established with Samantha Evans.  Neal Trulson S Lymon Kidney PA-C 03/06/2021   Cc: Kathie Dike, MD

## 2021-03-06 NOTE — Patient Instructions (Addendum)
If you are age 60 or older, your body mass index should be between 23-30. Your Body mass index is 34.8 kg/m. If this is out of the aforementioned range listed, please consider follow up with your Primary Care Provider.  If you are age 24 or younger, your body mass index should be between 19-25. Your Body mass index is 34.8 kg/m. If this is out of the aformentioned range listed, please consider follow up with your Primary Care Provider.   Your provider has requested that you go to the basement level for lab work before leaving today. Press "B" on the elevator. The lab is located at the first door on the left as you exit the elevator.  You have been scheduled for an endoscopy and colonoscopy. Please follow the written instructions given to you at your visit today. Please pick up your prep supplies at the pharmacy within the next 1-3 days. If you use inhalers (even only as needed), please bring them with you on the day of your procedure.  Follow a low sodium/2 gram Sodium diet.  You have been referred to Infectious Disease. They should contact you in the next 3 weeks.  You have been scheduled to follow up with [name] on June 05, 2021 at 10:50 am.  Thank you for entrusting me with your care and choosing [facility].  Amy Esterwood, PA-C

## 2021-03-07 NOTE — Anesthesia Postprocedure Evaluation (Signed)
Anesthesia Post Note  Patient: Samantha Evans  Procedure(s) Performed: REPAIR INCARCERATED UMBILICAL HERNIA (N/A Abdomen)     Patient location during evaluation: PACU Anesthesia Type: General Level of consciousness: awake and alert Pain management: pain level controlled Vital Signs Assessment: post-procedure vital signs reviewed and stable Respiratory status: spontaneous breathing, nonlabored ventilation, respiratory function stable and patient connected to nasal cannula oxygen Cardiovascular status: blood pressure returned to baseline and stable Postop Assessment: no apparent nausea or vomiting Anesthetic complications: no   No complications documented.            Effie Berkshire

## 2021-03-11 LAB — MITOCHONDRIAL ANTIBODIES: Mitochondrial M2 Ab, IgG: <=20 U

## 2021-03-11 LAB — ALPHA-1-ANTITRYPSIN: A-1 Antitrypsin, Ser: 165 mg/dL (ref 83–199)

## 2021-03-11 LAB — ANA: Anti Nuclear Antibody (ANA): NEGATIVE

## 2021-03-11 LAB — AFP TUMOR MARKER: AFP-Tumor Marker: 12 ng/mL — ABNORMAL HIGH

## 2021-03-11 LAB — CERULOPLASMIN: Ceruloplasmin: 36 mg/dL (ref 18–53)

## 2021-03-11 LAB — ANTI-SMOOTH MUSCLE ANTIBODY, IGG: Actin (Smooth Muscle) Antibody (IGG): 25 U — ABNORMAL HIGH (ref <20)

## 2021-03-16 DIAGNOSIS — Z419 Encounter for procedure for purposes other than remedying health state, unspecified: Secondary | ICD-10-CM | POA: Diagnosis not present

## 2021-03-19 ENCOUNTER — Other Ambulatory Visit (HOSPITAL_COMMUNITY): Payer: Self-pay

## 2021-03-19 ENCOUNTER — Encounter: Payer: Medicaid Other | Admitting: Internal Medicine

## 2021-03-19 ENCOUNTER — Telehealth: Payer: Self-pay

## 2021-03-19 NOTE — Telephone Encounter (Signed)
[name], English as a foreign language completed.    The patient is insured through [employer]  and has a $3.00  copay.  Medication will need a PA.  We will continue to follow to see if copay assistance is needed.  Samantha Evans, [facility] Patient [facility] for Infectious Disease Phone: [phone] Fax:  [phone]

## 2021-03-19 NOTE — Progress Notes (Deleted)
[facility] for Infectious Disease  Reason for Consult Hepatitis C  Referring Provider: Dr Charlane Ferretti   HPI:    Samantha Evans is a 60 y.o. female with PMHx as below who presents to the clinic for Hepatitis C.   Patient was recently hospitalized 4/14 through 03/03/2021 when she was admitted with incarcerated hernia and underwent repair.  Her admission CT scan showed a cirrhotic liver and a 2.1 cm fluid density in the right hepatic lobe.  Follow-up MRI of the liver 4/16 again showed cirrhosis and portal hypertension but no evidence of hepatocellular carcinoma.  Baseline labs showed AST 81, ALT 63, Foss 85, T bili 1.5, creatinine 1.6.3, PT 16, platelets 88.  She was found to be hepatitis C antibody positive as well as hep C RNA detectable at 954,000 copies.  This was a new diagnosis for patient in the hospital.  She states that she previously used to donate plasma along with her ex-husband who was subsequently "banned" from future plasma donations but she was never exactly sure why this was the case.  She reports that he was an injection drug user but she has never used IV drugs herself.  She does have a history of intranasal drug use.  She was seen by gastroenterology on 03/06/2021 for new diagnosis of cirrhosis and decompensated liver disease with meld of 11.  She will be scheduled for upper and lower endoscopy in the next several weeks.  Further labs showed mildly elevated AFP of 12.0 and anti-smooth muscle antibody mildly elevated at 25.  GI is planning for follow-up repeat AFP at the end of June and probable follow-up MRI after July follow-up.  FIB-4 score = 6.96.  Hepatitis A IgM nonreactive hepatitis B surface antigen nonreactive, core IgM nonreactive HIV nonreactive  Patient's Medications  New Prescriptions   No medications on file  Previous Medications   DOCUSATE SODIUM (COLACE) 100 MG CAPSULE    Take 1 capsule (100 mg total) by mouth 2 (two) times daily.   PANTOPRAZOLE  (PROTONIX) 40 MG TABLET    Take 1 tablet (40 mg total) by mouth 2 (two) times daily.   POLYETHYLENE GLYCOL (MIRALAX / GLYCOLAX) 17 G PACKET    Take 17 g by mouth daily as needed for mild constipation.   TRAMADOL (ULTRAM) 50 MG TABLET    Take 1 tablet (50 mg total) by mouth every 6 (six) hours as needed for severe pain.  Modified Medications   No medications on file  Discontinued Medications   No medications on file      Past Medical History:  Diagnosis Date  . Arthritis   . Carpal tunnel syndrome   . Cataracts, bilateral   . Cirrhosis ([place])     Social History   Tobacco Use  . Smoking status: Former Smoker    Packs/day: 0.50    Years: 40.00    Pack years: 20.00    Types: Cigarettes    Quit date: 02/27/2021    Years since quitting: 0.0  . Smokeless tobacco: Never Used  Substance Use Topics  . Alcohol use: Not Currently    Family History  Problem Relation Age of Onset  . Lung cancer Father   . Liver disease Neg Hx   . Colon cancer Neg Hx   . Esophageal cancer Neg Hx   . Pancreatic cancer Neg Hx   . Stomach cancer Neg Hx     No Known Allergies  ROS    OBJECTIVE:  There were no vitals filed for this visit.   There is no height or weight on file to calculate BMI.  Physical Exam   Labs and Microbiology:  CBC Latest Ref Rng & Units 03/01/2021 02/28/2021 02/27/2021  WBC 4.0 - 10.5 K/uL 9.9 6.0 6.2  Hemoglobin 12.0 - 15.0 g/dL 10.4(L) 11.2(L) 12.6  Hematocrit 36.0 - 46.0 % 29.9(L) 32.7(L) 36.5  Platelets 150 - 400 K/uL 97(L) 88(L) 135(L)   CMP Latest Ref Rng & Units 03/01/2021 02/28/2021 02/27/2021  Glucose 70 - 99 mg/dL 104(H) 152(H) 107(H)  BUN 6 - 20 mg/dL 24(H) 25(H) 25(H)  Creatinine 0.44 - 1.00 mg/dL 0.97 1.06(H) 1.18(H)  Sodium 135 - 145 mmol/L 134(L) 133(L) 136  Potassium 3.5 - 5.1 mmol/L 3.8 4.1 4.1  Chloride 98 - 111 mmol/L 106 108 106  CO2 22 - 32 mmol/L 25 21(L) 22  Calcium 8.9 - 10.3 mg/dL 10.1 9.8 10.4(H)  Total Protein 6.5 - 8.1 g/dL - 6.9 7.3   Total Bilirubin 0.3 - 1.2 mg/dL - 1.5(H) 2.8(H)  Alkaline Phos 38 - 126 U/L - 89 94  AST 15 - 41 U/L - 81(H) 105(H)  ALT 0 - 44 U/L - 63(H) 72(H)     No results found for this or any previous visit (from the past 240 hour(s)).  Imaging: ***   ASSESSMENT & PLAN:    No problem-specific Assessment & Plan notes found for this encounter.   No orders of the defined types were placed in this encounter.     ***  Raynelle Highland for Infectious Disease [facility] 03/19/2021, 5:25 AM

## 2021-03-19 NOTE — Telephone Encounter (Signed)
Attempted to call patient to reschedule today's missed appointment, no answer and patient does not have secure voicemail set up.   Beryle Flock, RN

## 2021-03-20 ENCOUNTER — Other Ambulatory Visit (HOSPITAL_COMMUNITY): Payer: Self-pay

## 2021-03-25 ENCOUNTER — Other Ambulatory Visit: Payer: Self-pay

## 2021-03-25 ENCOUNTER — Ambulatory Visit (INDEPENDENT_AMBULATORY_CARE_PROVIDER_SITE_OTHER): Payer: Medicaid Other | Admitting: Infectious Diseases

## 2021-03-25 ENCOUNTER — Encounter: Payer: Self-pay | Admitting: Infectious Diseases

## 2021-03-25 DIAGNOSIS — K219 Gastro-esophageal reflux disease without esophagitis: Secondary | ICD-10-CM | POA: Diagnosis not present

## 2021-03-25 DIAGNOSIS — K7469 Other cirrhosis of liver: Secondary | ICD-10-CM

## 2021-03-25 DIAGNOSIS — B182 Chronic viral hepatitis C: Secondary | ICD-10-CM | POA: Diagnosis not present

## 2021-03-25 MED ORDER — SOFOSBUVIR-VELPATASVIR 400-100 MG PO TABS: 1.0000 | ORAL_TABLET | Freq: Every day | ORAL | 2 refills | Status: DC

## 2021-03-25 NOTE — Assessment & Plan Note (Signed)
New Patient with Chronic Hepatitis C genotype unknown, treatment naive, with +rna recently during hospitalization. She has hepatic cirrhosis due to this unfortunately. I have concerns she will need more prolonged treatment for better chance of cure given Child Pugh B criteria met with recent labs. Will update today and see what the best course of action will be for her.  She may be an acceptable ribavarin candidate - will review with pharmacy team.   Risk for acquisition appears to be sexual with known hepatitis C + partner vs previous drug use.  No risk factors present the last 15 years.   I discussed with the patient the lab findings that confirm chronic hepatitis C as well as the natural history and progression of disease including about 30% of people who develop cirrhosis of the liver if left untreated and once cirrhosis is established there is a 2-7% risk per year of liver cancer and liver failure.  I discussed the importance of treatment and benefits in reducing the risk, even if significant liver fibrosis exists. I also discussed risk for re-infection following treatment should he not continue to modify risk factors.    Patient counseled extensively on limiting acetaminophen to no more than 1 gram daily, avoidance of alcohol.  Transmission discussed with patient including sexual transmission, sharing razors and toothbrush.   With known cirrhosis she will continue to work with gastroenterology for hepatic care   Will prescribe appropriate medication based on genotype and coverage   Hepatitis A and B titers to be drawn today with appropriate vaccinations as needed   Pneumovax vaccine at upcoming visit if not previously given   Will call Brailynn Breth Repinski back once all results are in and counsel on medication over the phone. She will return 4 weeks after starting to meet with pharmacy team and check RNA at that time.

## 2021-03-25 NOTE — Progress Notes (Signed)
Patient Name: Samantha Evans  Date of Birth: 11/23/1960  MRN: [id number]  PCP: [name], MD  Referring Provider: [name], MD, Ph#: [phone]   CC:  New patient - initial evaluation and management of chronic hepatitis C infection.     HPI/ROS:  Samantha Evans is a 60 y.o. female here for discussion regarding hepatitis C treatment.  She has been hospitalized recently for abdominal pain and found to have a severe hernia that was repaired about a month and a half ago.  She finally feels like she is coming along regarding her recovery but still having some lower abdominal pain and discomfort.  During this hospitalization was found that her liver function tests were quite high.  During this work-up it was found that she was positive for hepatitis C antibody with detectable viral load.  MRI of the liver does reveal cirrhotic changes with portal hypertension as well as some focal lesions in the liver that were not concerning for [facility] but recommended 57-monthfollow-up.  [facility] and is planning for a upper endoscopy sometime in July.  She does not drink alcohol and has been free from all illicit substances for 15 years.  She is never injected drugs but she did frequently smoke crack cocaine.  She quit smoking cigarettes recently as well.  She is not quite certain when she was exposed to hepatitis but tells me that her ex-husband has hepatitis C.  They both used to donate plasma and recalled that at one point she was "banned from donating any further blood products."  She does not recall the details of why.  She had 4 children before 1992 all vaginal births. Gallbladder removal after first 2 were born.   HIV screen 02/28/21 non reactive  AFP elevated (known cirrhosis)   Patient does not have documented immunity to Hepatitis A. Patient does not have documented immunity to Hepatitis B.     Constitutional: positive for fevers,  chills, malaise, anorexia and weight loss Eyes: negative for icterus, positive cataracts  Cardiovascular: negative for chest pain, dyspnea, orthopnea, positive lower extremity edema Gastrointestinal: positive for reflux symptoms and abdominal pain, negative for nausea, vomiting, melena, diarrhea, constipation and jaundice Hematologic/lymphatic: negative for bleeding, lymphadenopathy and petechiae Musculoskeletal: negative for myalgias and arthralgias Neurological: negative for headaches, paresthesia, coordination problems and gait problems Behavioral/Psych: negative for excessive alcohol consumption and illegal drug usage All other systems reviewed and are negative      Past Medical History:  Diagnosis Date  . Arthritis   . Carpal tunnel syndrome   . Cataracts, bilateral   . Cir[place]osis ([place]     Prior to Admission medications   Medication Sig Start Date End Date Taking? Authorizing Provider  docusate sodium (COLACE) 100 MG capsule Take 1 capsule (100 mg total) by mouth 2 (two) times daily. 03/03/21   MKathie Dike MD  pantoprazole (PROTONIX) 40 MG tablet Take 1 tablet (40 mg total) by mouth 2 (two) times daily. 03/03/21   MKathie Dike MD  polyethylene glycol (MIRALAX / GLYCOLAX) 17 g packet Take 17 g by mouth daily as needed for mild constipation. 03/03/21   MKathie Dike MD  traMADol (ULTRAM) 50 MG tablet Take 1 tablet (50 mg total) by mouth every 6 (six) hours as needed for severe pain. 03/03/21   Meuth, Brooke A, PA-C    No Known Allergies  Social History   Tobacco Use  . Smoking status: Former Smoker  Packs/day: 0.50    Years: 40.00    Pack years: 20.00    Types: Cigarettes    Quit date: 02/27/2021    Years since quitting: 0.0  . Smokeless tobacco: Never Used  Substance Use Topics  . Alcohol use: Not Currently    Family History  Problem Relation Age of Onset  . Lung cancer Father   . Liver disease Neg Hx   . Colon cancer Neg Hx   . Esophageal cancer Neg Hx    . Pancreatic cancer Neg Hx   . Stomach cancer Neg Hx     Objective:   Vitals:   03/25/21 1428  BP: (!) 156/75  Pulse: 73   Constitutional: in no apparent distress, well developed and well nourished and oriented times 3 Eyes: anicteric Cardiovascular: Cor RRR and No murmurs Respiratory: clear Gastrointestinal: Bowel sounds are normal, liver is not enlarged, spleen is not enlarged but body habitus limiting exam. Healing surgical incision at umbilicus  Musculoskeletal: pedal edema 1-2 +, intact peripheral pulses Skin: negative for - jaundice, spider hemangioma, telangiectasia, palmar erythema, ecchymosis and atrophy; no porphyria cutanea tarda Lymphatic: no cervical lymphadenopathy   Laboratory: Genotype: No results found for: HCVGENOTYPE HCV viral load:  Lab Results  Component Value Date   HCVQUANT 954,000 03/02/2021   Lab Results  Component Value Date   WBC 9.9 03/01/2021   HGB 10.4 (L) 03/01/2021   HCT 29.9 (L) 03/01/2021   MCV 96.5 03/01/2021   PLT 97 (L) 03/01/2021    Lab Results  Component Value Date   CREATININE 0.97 03/01/2021   BUN 24 (H) 03/01/2021   NA 134 (L) 03/01/2021   K 3.8 03/01/2021   CL 106 03/01/2021   CO2 25 03/01/2021    Lab Results  Component Value Date   ALT 63 (H) 02/28/2021   AST 81 (H) 02/28/2021   ALKPHOS 89 02/28/2021    Lab Results  Component Value Date   INR 1.3 (H) 02/27/2021   BILITOT 1.5 (H) 02/28/2021   ALBUMIN 2.6 (L) 02/28/2021     Imaging:  MRI of liver 03/01/2021 - cirrhosis+ with portal hypertension, enlarged spleen (14 cm) with trace ascites. Recommended FU in 14m    Assessment & Plan:   Problem List Items Addressed This Visit      Unprioritized   Hepatic cirrhosis ([place] by [place] scan    Reviewed MRI results today.  No obvious tumors associated with hepatocellular carcinoma.  Does have a small cystlike lesion that recommended follow-up in 6 months.  She does have portal hypertension demonstrated on her scan as  well and is awaiting EGD for variceal follow-up.  She does have some findings of synthetic liver dysfunction that at least recently appear to have her classified Child Pugh B (Tbili near 3, slightly elevated INR < 1.7, thrombocytopenia, ascites on scans previously, hypoalbuminemia).  These were all collected in the setting of concurrent abdominal hernia.  We will go ahead and repeat these to restage today which will help with duration of therapy (6 months versus 12 weeks for Epclusa).  Recommended complete cessation from alcohol (as she has been doing) as well as limiting salt containing foods, optimizing protein and limiting tylenol to as needed 1000 mg over 24 hour period.       Chronic hepatitis C without hepatic coma ([place]    New Patient with Chronic Hepatitis C genotype unknown, treatment naive, with +rna recently during hospitalization. She has hepatic cirrhosis due to this unfortunately. I have concerns  she will need more prolonged treatment for better chance of cure given Child Pugh B criteria met with recent labs. Will update today and see what the best course of action will be for her.  She may be an acceptable ribavarin candidate - will review with pharmacy team.   Risk for acquisition appears to be sexual with known hepatitis C + partner vs previous drug use.  No risk factors present the last 15 years.   I discussed with the patient the lab findings that confirm chronic hepatitis C as well as the natural history and progression of disease including about 30% of people who develop cirrhosis of the liver if left untreated and once cirrhosis is established there is a 2-7% risk per year of liver cancer and liver failure.  I discussed the importance of treatment and benefits in reducing the risk, even if significant liver fibrosis exists. I also discussed risk for re-infection following treatment should he not continue to modify risk factors.    Patient counseled extensively on limiting  acetaminophen to no more than 1 gram daily, avoidance of alcohol.  Transmission discussed with patient including sexual transmission, sharing razors and toothbrush.   With known cirrhosis she will continue to work with gastroenterology for hepatic care   Will prescribe appropriate medication based on genotype and coverage   Hepatitis A and B titers to be drawn today with appropriate vaccinations as needed   Pneumovax vaccine at upcoming visit if not previously given   Will call Arohi Salvatierra Sheaffer back once all results are in and counsel on medication over the phone. She will return 4 weeks after starting to meet with pharmacy team and check RNA at that time.        Relevant Medications   Sofosbuvir-Velpatasvir (EPCLUSA) 400-100 MG TABS   Other Relevant Orders   Hepatitis C genotype   Hepatitis B surface antibody,qualitative   Hepatitis A Ab, Total   Comp Met (CMET)   CBC   Protime-INR   Acid reflux    She seems to be off pantoprazole for the moment at least. Will need to review with her alternative options for treatment given drug interaction with Epclusa.          I spent 45 minutes with the patient including greater than 70% of time in face to face counsel of the patient re hepatitis c and the details described above and in coordination of their care.  Janene Madeira, MSN, NP-C [facility] for Infectious Disease [email or web address] Pager: [phone] Office: [phone] [place]: [phone]

## 2021-03-25 NOTE — Assessment & Plan Note (Signed)
She seems to be off pantoprazole for the moment at least. Will need to review with her alternative options for treatment given drug interaction with Epclusa.

## 2021-03-25 NOTE — Assessment & Plan Note (Addendum)
Reviewed MRI results today.  No obvious tumors associated with hepatocellular carcinoma.  Does have a small cystlike lesion that recommended follow-up in 6 months.  She does have portal hypertension demonstrated on her scan as well and is awaiting EGD for variceal follow-up.  She does have some findings of synthetic liver dysfunction that at least recently appear to have her classified Child Pugh B (Tbili near 3, slightly elevated INR < 1.7, thrombocytopenia, ascites on scans previously, hypoalbuminemia).  These were all collected in the setting of concurrent abdominal hernia.  We will go ahead and repeat these to restage today which will help with duration of therapy (6 months versus 12 weeks for Epclusa).  Recommended complete cessation from alcohol (as she has been doing) as well as limiting salt containing foods, optimizing protein and limiting tylenol to as needed 1000 mg over 24 hour period.

## 2021-03-25 NOTE — Patient Instructions (Addendum)
Nice to meet you today!    We need to get a little more information about your hepatitis c infection before we start your treatment. I anticipate that we can get you started in a few weeks after we submit approval to your insurance to ensure payment.   Please continue to follow with gastroenterology - you have a planned appointment with them in July.   We may need to do an extended treatment to get the best chance for cure for you, which means maybe 6 months of medication. We will see what your lab work shows today.     ABOUT HEPATITIS C VIRUS:   Chronic Hepatitis C is the most common blood-borne infection in the [place], affecting approximately 3 million people.   It is the leading cause of cirrhosis, liver cancer, and end stage liver disease requiring transplantation when this infection goes untreated for many years   The majority of people who are infected are unaware because there are not many early symptoms that are specific to this and often go undiagnosed until a specific blood test is drawn.    The hepatitis c virus is passed primarily through direct exposure of contaminated blood or body fluids. It is most efficiently transmitted through repeated exposure to infected blood.   Risk for sexual transmission is very low but is possible if there is high frequency of unprotected sexual activity with known hepatitis c partner or multiple partners of known status.   Over time, approximately 60-70% of people can develop some degree of liver disease. Cirrhosis occurs in 10-20% of those with chronic infection. 1-5% will get liver cancer, which has a very high rate of death.    Approximately 15-25% clear the infection without medication (usually in the first 6 months of becoming exposed to virus)   Newer medications provide over 95% cure rate when taken as prescribed    IN GENERAL ABOUT DIET  . Persons living with chronic hepatitis c infection should eat a diet to maintain a  healthy weight and avoid nutritional deficiencies.   . Completely avoiding alcohol is the best decision for your liver health. If unable to do so please limit alcohol to as little as possible to less than 1 standard drink a day - this is very irritating to your liver.  . Limit tylenol use to less than 2,000 mg daily (two extra strength tablets only twice a day)  . If you have cirrhosis of the liver please take no more than 1,000 mg tylenol a day  . Patients with cirrhosis should not have protein restriction; we recommend a protein intake of approximately 1.2-1.5 g/kg/day.   . For patients with cirrhosis and hepatic encephalopathy, the American Association for the Study of Liver Diseases (AASLD) recommended protein intake is 1.2-1.5 g/kg/day.  . If you experience ascites (fluid accumulation in the abdomen associated with severe liver damage / cirrhosis) please limit sodium intake to < 2000 mg a day    UNTIL YOU HAVE BEEN TREATED AND CURED:  . Use condoms with all sexual encounters or practice abstinence to avoid sexual transmission   . No sharing of razors, toothbrushes, nail clippers or anything that could potentially have blood on it.   . If you cut yourself please clean and cover any wounds or open sores to others do not come into contact with your blood.   . If blood spills onto item/surface please clean with 1:10 bleach solution and allow to dry, EVEN if it is dried blood.  GENERAL HELPFUL HINTS ON HCV THERAPY:  1. Stay well-hydrated.  2. Notify the ID Clinic of any changes in your other over-the-counter/herbal or prescription medications.  3. If you miss a dose of your medication, take the missed dose as soon as you remember. Return to your regular time/dose schedule the next day.   4.  Do not stop taking your medications without first talking with your healthcare provider.  5.  You will see our pharmacist-specialist within the first 2 weeks of starting your medication to  monitor for any possible side effects.  6.  You will have blood work once during treatment 4 weeks after your first pill. Again soon after treatment is completed and one final lab 3 months after your last pill to ensure cure!   TIPS TO BE SUCCESSFUL WITH DAILY MEDICATION USE:  1. Set a reminder on your phone  2. Try filling out a pill box for the week - pick a day and put one pill for every day during the week so you know right away if you missed a pill.   3. Have a trusted family member ask you about your medications.   4. Smartphone app    Medication we would like to use for you will be : [place] (pending your insurance evaluation)   Epclusa Instructions:  1. Take Epclusa, one tablet daily with or without food. You should take it at approximately the same time every day. Your treatment will be for 12 weeks. Do not miss a dose.    2. Do not run out of Epclusa! If you are down to one week of medication left and have not heard about your next shipment, please let us know as soon as possible. You will be given 28 days of medication at a time and provided with 2 refills.   3. If you need to start a new medication, prescription from your doctor or over the counter medication, you need to contact us to make sure it does not interfere with Epclusa. There are several medications that can interfere with Epclusa and can make you sick or make the medication not work.  4. If you need to take a medication for acid reflux, your choices are as follows: 1. TUMS or Rolaids separated at least four hours from [place].  2. Pepcid (famotidine) 40mg  up to twice per day administered with Epclusa or 12 hours apart from [place].    5. Tylenol (acetominophen) and Advil (ibuprofen) are safe to take with Epclusa if needed for headache, fever, pain.   6. DO NOT stop Epclusa unless instructed to by your provider. If you are hospitalized while taking this medication please bring it with you to the hospital to  avoid interruption of therapy. Every pill is important!  7. The most common side effects associated with Epclusa  include:  o Fatigue o Headache o Nausea o Diarrhea o Insomnia

## 2021-03-28 LAB — HEPATITIS A ANTIBODY, TOTAL: Hepatitis A AB,Total: NONREACTIVE

## 2021-03-28 LAB — COMPREHENSIVE METABOLIC PANEL
AG Ratio: 0.9 (calc) — ABNORMAL LOW (ref 1.0–2.5)
ALT: 60 U/L — ABNORMAL HIGH (ref 6–29)
AST: 110 U/L — ABNORMAL HIGH (ref 10–35)
Albumin: 3.1 g/dL — ABNORMAL LOW (ref 3.6–5.1)
Alkaline phosphatase (APISO): 93 U/L (ref 37–153)
BUN: 22 mg/dL (ref 7–25)
CO2: 24 mmol/L (ref 20–32)
Calcium: 10 mg/dL (ref 8.6–10.4)
Chloride: 107 mmol/L (ref 98–110)
Creat: 0.84 mg/dL (ref 0.50–0.99)
Globulin: 3.3 g/dL (calc) (ref 1.9–3.7)
Glucose, Bld: 68 mg/dL (ref 65–99)
Potassium: 4.6 mmol/L (ref 3.5–5.3)
Sodium: 137 mmol/L (ref 135–146)
Total Bilirubin: 2.3 mg/dL — ABNORMAL HIGH (ref 0.2–1.2)
Total Protein: 6.4 g/dL (ref 6.1–8.1)

## 2021-03-28 LAB — CBC
HCT: 30.1 % — ABNORMAL LOW (ref 35.0–45.0)
Hemoglobin: 10.4 g/dL — ABNORMAL LOW (ref 11.7–15.5)
MCH: 31.3 pg (ref 27.0–33.0)
MCHC: 34.6 g/dL (ref 32.0–36.0)
MCV: 90.7 fL (ref 80.0–100.0)
MPV: 9.7 fL (ref 7.5–12.5)
Platelets: 99 10*3/uL — ABNORMAL LOW (ref 140–400)
RBC: 3.32 10*6/uL — ABNORMAL LOW (ref 3.80–5.10)
RDW: 12.7 % (ref 11.0–15.0)
WBC: 3.9 10*3/uL (ref 3.8–10.8)

## 2021-03-28 LAB — PROTIME-INR
INR: 1.3 — ABNORMAL HIGH
Prothrombin Time: 13.3 s — ABNORMAL HIGH (ref 9.0–11.5)

## 2021-03-28 LAB — HEPATITIS B SURFACE ANTIBODY,QUALITATIVE: Hep B S Ab: NONREACTIVE

## 2021-03-28 LAB — HEPATITIS C GENOTYPE

## 2021-03-30 ENCOUNTER — Encounter: Payer: Self-pay | Admitting: Family Medicine

## 2021-03-31 ENCOUNTER — Telehealth: Payer: Self-pay

## 2021-03-31 ENCOUNTER — Other Ambulatory Visit (HOSPITAL_COMMUNITY): Payer: Self-pay

## 2021-03-31 ENCOUNTER — Telehealth: Payer: Self-pay | Admitting: Infectious Diseases

## 2021-03-31 NOTE — Telephone Encounter (Signed)
[facility] Patient Advocate Encounter   Received notification from [facility] that prior authorization for Raeanne Gathers is required.   PA submitted on 03/31/21 Key  [id number] Status is pending    [facility] will continue to follow.   Ileene Patrick, [facility] Patient [facility] for Infectious Disease Phone: [phone] Fax:  [phone]

## 2021-03-31 NOTE — Telephone Encounter (Signed)
I tried calling Regine on her home phone - unable to LVM due to it being full.  Mychart message sent.   Will need to treat as child pugh B. Would like to d/w Louana the ability of her to come weekly for labs to check for cytopenias while on Epclusa + Ribivarin. If this is not something she can do we can plan to proceed with Epclusa x 24 weeks.   Will await call back from patient or we can discuss at our appointment coming up in June. Would like to get her treated asap.

## 2021-04-01 ENCOUNTER — Telehealth: Payer: Self-pay

## 2021-04-01 ENCOUNTER — Other Ambulatory Visit (HOSPITAL_COMMUNITY): Payer: Self-pay

## 2021-04-01 NOTE — Telephone Encounter (Signed)
[facility] Patient Advocate Encounter  Prior Authorization for Samantha Evans has been approved.    PA# [id number] Effective dates: 03/19/21 through 09/15/21  Patients co-pay is $0.00.   Script can go to [facility].  [facility] will continue to follow.  Ileene Patrick, [facility] Patient [facility] for Infectious Disease Phone: [phone] Fax:  [phone]

## 2021-04-02 ENCOUNTER — Encounter: Payer: Self-pay | Admitting: Family Medicine

## 2021-04-02 ENCOUNTER — Ambulatory Visit: Payer: Medicaid Other | Attending: Family Medicine | Admitting: Family Medicine

## 2021-04-02 ENCOUNTER — Other Ambulatory Visit: Payer: Self-pay

## 2021-04-02 ENCOUNTER — Other Ambulatory Visit (HOSPITAL_COMMUNITY): Payer: Self-pay

## 2021-04-02 VITALS — BP 158/73 | HR 70 | Ht 61.0 in | Wt 180.0 lb

## 2021-04-02 DIAGNOSIS — S31109A Unspecified open wound of abdominal wall, unspecified quadrant without penetration into peritoneal cavity, initial encounter: Secondary | ICD-10-CM | POA: Diagnosis not present

## 2021-04-02 DIAGNOSIS — B182 Chronic viral hepatitis C: Secondary | ICD-10-CM | POA: Diagnosis not present

## 2021-04-02 DIAGNOSIS — I10 Essential (primary) hypertension: Secondary | ICD-10-CM | POA: Diagnosis not present

## 2021-04-02 DIAGNOSIS — L089 Local infection of the skin and subcutaneous tissue, unspecified: Secondary | ICD-10-CM

## 2021-04-02 MED ORDER — AMLODIPINE BESYLATE 5 MG PO TABS: 5.0000 mg | ORAL_TABLET | Freq: Every day | ORAL | 1 refills | Status: DC

## 2021-04-02 NOTE — Progress Notes (Signed)
History of Hep C. Has leaking from surgery site.

## 2021-04-02 NOTE — Progress Notes (Signed)
Subjective:  Patient ID: Samantha Evans, female    DOB: June 21, 1961  Age: 60 y.o. MRN: [id number]  CC: New Patient (Initial Visit)   HPI KATHE WIRICK is a 60 year old female with a history of hepatitis C, incarcerated umbilical hernia repair who presents today to establish care.  Interval History: She has had off and on clear discharge from her surgical site. She sometimes has sharp pain in her abdomen. Moves bowels okay and has no nausea or vomiting. She had a visit with infectious disease last week for management of hepatitis C and was prescribed Epclusa which she is yet to pick up but she states the pharmacy did not have it. Her blood pressure is elevated and was also elevated at her infectious disease visit.  She is currently not on any antihypertensives. Past Medical History:  Diagnosis Date  . Arthritis   . Carpal tunnel syndrome   . Cataracts, bilateral   . Cirrhosis ([place])     Past Surgical History:  Procedure Laterality Date  . ERCP    . UMBILICAL HERNIA REPAIR N/A 02/27/2021   Procedure: REPAIR INCARCERATED UMBILICAL HERNIA;  Surgeon: Georganna Skeans, MD;  Location: [facility] OR;  Service: General;  Laterality: N/A;    Family History  Problem Relation Age of Onset  . Lung cancer Father   . Liver disease Neg Hx   . Colon cancer Neg Hx   . Esophageal cancer Neg Hx   . Pancreatic cancer Neg Hx   . Stomach cancer Neg Hx     No Known Allergies  Outpatient Medications Prior to Visit  Medication Sig Dispense Refill  . docusate sodium (COLACE) 100 MG capsule Take 1 capsule (100 mg total) by mouth 2 (two) times daily. (Patient not taking: No sig reported) 10 capsule 0  . pantoprazole (PROTONIX) 40 MG tablet Take 1 tablet (40 mg total) by mouth 2 (two) times daily. (Patient not taking: No sig reported) 30 tablet 0  . polyethylene glycol (MIRALAX / GLYCOLAX) 17 g packet Take 17 g by mouth daily as needed for mild constipation. (Patient not taking: No sig reported) 14 each 0  .  Sofosbuvir-Velpatasvir (EPCLUSA) 400-100 MG TABS Take 1 tablet by mouth daily. (Patient not taking: Reported on 04/02/2021) 28 tablet 2  . traMADol (ULTRAM) 50 MG tablet Take 1 tablet (50 mg total) by mouth every 6 (six) hours as needed for severe pain. (Patient not taking: No sig reported) 15 tablet 0   No facility-administered medications prior to visit.     ROS Review of Systems  Constitutional: Negative for activity change, appetite change and fatigue.  HENT: Negative for congestion, sinus pressure and sore throat.   Eyes: Negative for visual disturbance.  Respiratory: Negative for cough, chest tightness, shortness of breath and wheezing.   Cardiovascular: Negative for chest pain and palpitations.  Gastrointestinal: Negative for abdominal distention, abdominal pain and constipation.  Endocrine: Negative for polydipsia.  Genitourinary: Negative for dysuria and frequency.  Musculoskeletal: Negative for arthralgias and back pain.  Skin: Positive for wound. Negative for rash.  Neurological: Negative for tremors, light-headedness and numbness.  Hematological: Does not bruise/bleed easily.  Psychiatric/Behavioral: Negative for agitation and behavioral problems.    Objective:  BP (!) 158/73   Pulse 70   Ht 5\' 1"  (1.549 m)   Wt 180 lb (81.6 kg)   SpO2 99%   BMI 34.01 kg/m   BP/Weight 04/02/2021 03/25/2021 12/16/8655  Systolic BP 846 962 952  Diastolic BP 73 75 64  Wt. (Lbs) 180 182.4 184.2  BMI 34.01 34.46 34.8      Physical Exam Constitutional:      Appearance: She is well-developed.  Neck:     Vascular: No JVD.  Cardiovascular:     Rate and Rhythm: Normal rate.     Heart sounds: Normal heart sounds. No murmur heard.   Pulmonary:     Effort: Pulmonary effort is normal.     Breath sounds: Normal breath sounds. No wheezing or rales.  Chest:     Chest wall: No tenderness.  Abdominal:     General: Bowel sounds are normal. There is no distension.     Palpations: Abdomen  is soft. There is no mass.     Tenderness: There is no abdominal tenderness.     Comments: Slight open area on right lateral aspect of surgical scar on abdomen with no drainage  Musculoskeletal:        General: Normal range of motion.     Right lower leg: No edema.     Left lower leg: No edema.  Neurological:     Mental Status: She is alert and oriented to person, place, and time.  Psychiatric:        Mood and Affect: Mood normal.     CMP Latest Ref Rng & Units 03/25/2021 03/01/2021 02/28/2021  Glucose 65 - 99 mg/dL 68 104(H) 152(H)  BUN 7 - 25 mg/dL 22 24(H) 25(H)  Creatinine 0.50 - 0.99 mg/dL 0.84 0.97 1.06(H)  Sodium 135 - 146 mmol/L 137 134(L) 133(L)  Potassium 3.5 - 5.3 mmol/L 4.6 3.8 4.1  Chloride 98 - 110 mmol/L 107 106 108  CO2 20 - 32 mmol/L 24 25 21(L)  Calcium 8.6 - 10.4 mg/dL 10.0 10.1 9.8  Total Protein 6.1 - 8.1 g/dL 6.4 - 6.9  Total Bilirubin 0.2 - 1.2 mg/dL 2.3(H) - 1.5(H)  Alkaline Phos 38 - 126 U/L - - 89  AST 10 - 35 U/L 110(H) - 81(H)  ALT 6 - 29 U/L 60(H) - 63(H)    Lipid Panel  No results found for: CHOL, TRIG, HDL, CHOLHDL, VLDL, LDLCALC, LDLDIRECT  CBC    Component Value Date/Time   WBC 3.9 03/25/2021 1522   RBC 3.32 (L) 03/25/2021 1522   HGB 10.4 (L) 03/25/2021 1522   HCT 30.1 (L) 03/25/2021 1522   PLT 99 (L) 03/25/2021 1522   MCV 90.7 03/25/2021 1522   MCH 31.3 03/25/2021 1522   MCHC 34.6 03/25/2021 1522   RDW 12.7 03/25/2021 1522   LYMPHSABS 1.1 02/27/2021 1345   MONOABS 0.5 02/27/2021 1345   EOSABS 0.1 02/27/2021 1345   BASOSABS 0.0 02/27/2021 1345    No results found for: HGBA1C  Assessment & Plan:  1. Essential hypertension New diagnosis We will initiate amlodipine Counseled on blood pressure goal of less than 130/80, low-sodium, DASH diet, medication compliance, 150 minutes of moderate intensity exercise per week. Discussed medication compliance, adverse effects. - amLODipine (NORVASC) 5 MG tablet; Take 1 tablet (5 mg total) by  mouth daily.  Dispense: 90 tablet; Refill: 1  2. Chronic hepatitis C without hepatic coma (HCC) With elevated LFTs Yet to pick up Epclusa Advised to contact infectious disease for prescription  3. Chronic abdominal wound infection, sequela No evidence of infection and abdominal wound Continue with daily dressing    Meds ordered this encounter  Medications  . amLODipine (NORVASC) 5 MG tablet    Sig: Take 1 tablet (5 mg total) by mouth daily.  Dispense:  90 tablet    Refill:  1    Follow-up: Return in about 3 months (around 07/03/2021) for Chronic disease management.       [name], MD, FAAFP. [facility] and [place], [place]   04/02/2021, 1:20 PM

## 2021-04-02 NOTE — Patient Instructions (Signed)

## 2021-04-07 ENCOUNTER — Other Ambulatory Visit: Payer: Self-pay | Admitting: Pharmacist

## 2021-04-07 ENCOUNTER — Other Ambulatory Visit (HOSPITAL_COMMUNITY): Payer: Self-pay

## 2021-04-07 DIAGNOSIS — B182 Chronic viral hepatitis C: Secondary | ICD-10-CM

## 2021-04-08 ENCOUNTER — Encounter: Payer: Self-pay | Admitting: Family Medicine

## 2021-04-08 ENCOUNTER — Encounter (INDEPENDENT_AMBULATORY_CARE_PROVIDER_SITE_OTHER): Payer: Self-pay

## 2021-04-08 ENCOUNTER — Other Ambulatory Visit: Payer: Self-pay | Admitting: Family Medicine

## 2021-04-08 ENCOUNTER — Other Ambulatory Visit (HOSPITAL_COMMUNITY): Payer: Self-pay

## 2021-04-08 DIAGNOSIS — I10 Essential (primary) hypertension: Secondary | ICD-10-CM

## 2021-04-08 MED ORDER — SOFOSBUVIR-VELPATASVIR 400-100 MG PO TABS
1.0000 | ORAL_TABLET | Freq: Every day | ORAL | 5 refills | Status: DC
  Filled 2021-04-08 (×2): qty 28, 28d supply, fill #0
  Filled 2021-04-28 – 2021-04-30 (×2): qty 28, 28d supply, fill #1
  Filled 2021-05-27: qty 28, 28d supply, fill #2
  Filled 2021-07-10: qty 28, 28d supply, fill #3
  Filled 2021-08-04: qty 28, 28d supply, fill #4
  Filled 2021-09-02: qty 28, 28d supply, fill #5

## 2021-04-08 MED ORDER — AMLODIPINE BESYLATE 5 MG PO TABS: 5.0000 mg | ORAL_TABLET | Freq: Every day | ORAL | 1 refills | Status: DC

## 2021-04-09 ENCOUNTER — Other Ambulatory Visit (HOSPITAL_COMMUNITY): Payer: Self-pay

## 2021-04-09 ENCOUNTER — Telehealth: Payer: Self-pay

## 2021-04-09 NOTE — Telephone Encounter (Signed)
[facility] Patient Advocate Encounter  Patient's medications have been couriered to [facility] from [place] and will be picked up 04/10/21.  Samantha Evans , [facility] Patient [facility] for Infectious Disease Phone: [phone] Fax:  [phone]

## 2021-04-10 ENCOUNTER — Other Ambulatory Visit: Payer: Self-pay

## 2021-04-10 ENCOUNTER — Ambulatory Visit (INDEPENDENT_AMBULATORY_CARE_PROVIDER_SITE_OTHER): Payer: Medicaid Other | Admitting: Pharmacist

## 2021-04-10 ENCOUNTER — Other Ambulatory Visit (HOSPITAL_COMMUNITY): Payer: Self-pay

## 2021-04-10 DIAGNOSIS — B182 Chronic viral hepatitis C: Secondary | ICD-10-CM | POA: Diagnosis not present

## 2021-04-10 NOTE — Progress Notes (Signed)
HPI: Samantha Evans is a 60 y.o. female who presents to the [facility] for Hepatitis C follow-up.  Medication: Epclusa x 6 months  Start Date: 04/11/21  Hepatitis C Genotype: 1a  Fibrosis Score: F4 decompensatd  Hepatitis C RNA: 954,000 on 03/02/21  Patient Active Problem List   Diagnosis Date Noted  . Chronic hepatitis C without hepatic coma ([place]) 03/25/2021  . Acid reflux 03/25/2021  . Incarcerated umbilical hernia 46/56/8127  . Small bowel obstruction ([place]) 02/27/2021  . Hepatic cirrhosis ([place])- by CT scan 02/27/2021  . AKI (acute kidney injury) ([place]) 02/27/2021    Patient's Medications  New Prescriptions   No medications on file  Previous Medications   AMLODIPINE (NORVASC) 5 MG TABLET    Take 1 tablet (5 mg total) by mouth daily.   DOCUSATE SODIUM (COLACE) 100 MG CAPSULE    Take 1 capsule (100 mg total) by mouth 2 (two) times daily.   PANTOPRAZOLE (PROTONIX) 40 MG TABLET    Take 1 tablet (40 mg total) by mouth 2 (two) times daily.   POLYETHYLENE GLYCOL (MIRALAX / GLYCOLAX) 17 G PACKET    Take 17 g by mouth daily as needed for mild constipation.   SOFOSBUVIR-VELPATASVIR (EPCLUSA) 400-100 MG TABS    Take 1 tablet by mouth daily.   TRAMADOL (ULTRAM) 50 MG TABLET    Take 1 tablet (50 mg total) by mouth every 6 (six) hours as needed for severe pain.  Modified Medications   No medications on file  Discontinued Medications   No medications on file    Allergies: No Known Allergies  Past Medical History: Past Medical History:  Diagnosis Date  . Arthritis   . Carpal tunnel syndrome   . Cataracts, bilateral   . Cirrhosis ([place])     Social History: Social History   Socioeconomic History  . Marital status: Legally Separated    Spouse name: Not on file  . Number of children: Not on file  . Years of education: Not on file  . Highest education level: Not on file  Occupational History  . Not on file  Tobacco Use  . Smoking status: Former Smoker     Packs/day: 0.50    Years: 40.00    Pack years: 20.00    Types: Cigarettes    Quit date: 02/27/2021    Years since quitting: 0.1  . Smokeless tobacco: Never Used  Substance and Sexual Activity  . Alcohol use: Not Currently  . Drug use: Not on file  . Sexual activity: Not on file  Other Topics Concern  . Not on file  Social History Narrative  . Not on file   Social Determinants of Health   Financial Resource Strain: Not on file  Food Insecurity: Not on file  Transportation Needs: Not on file  Physical Activity: Not on file  Stress: Not on file  Social Connections: Not on file    Labs: Hepatitis C Lab Results  Component Value Date   HCVGENOTYPE 1a 03/25/2021   Hepatitis B Lab Results  Component Value Date   HEPBSAB NON-REACTIVE 03/25/2021   HEPBSAG NON REACTIVE 03/01/2021   Hepatitis A Lab Results  Component Value Date   HAV NON-REACTIVE 03/25/2021   HIV Lab Results  Component Value Date   HIV Non Reactive 02/28/2021   Lab Results  Component Value Date   CREATININE 0.84 03/25/2021   CREATININE 0.97 03/01/2021   CREATININE 1.06 (H) 02/28/2021   CREATININE 1.18 (H) 02/27/2021   CREATININE  0.80 02/23/2021   Lab Results  Component Value Date   AST 110 (H) 03/25/2021   AST 81 (H) 02/28/2021   AST 105 (H) 02/27/2021   ALT 60 (H) 03/25/2021   ALT 63 (H) 02/28/2021   ALT 72 (H) 02/27/2021   INR 1.3 (H) 03/25/2021   INR 1.3 (H) 02/27/2021    Assessment: Samantha Evans comes in today to pick up her Epclusa. She has not started taking it yet; this is her first fill. She has decompensated cirrhosis and will require 6 months of treatment.   Patient is approved to receive Epclusa x 24 weeks for chronic Hepatitis C infection. Counseled patient to take Epclusa daily with or without food. Encouraged patient not to miss any doses and explained how their chance of cure could go down with each dose missed. Counseled patient on what to do if dose is missed - if it is closer to  the missed dose take immediately; if closer to next dose skip dose and take the next dose at the usual time.   Counseled patient on common side effects such as headache, fatigue, and nausea and that these normally decrease with time. I reviewed patient medications and found no drug interactions (she is not taking Protonix). Discussed with patient that there are several drug interactions including acid suppressants. Instructed patient to call clinic if she wishes to start a new medication during course of therapy. Also advised patient to call if she experiences any side effects.   I emphasized the need to not miss doses and to stay on track for all 6 months. Also encouraged her to get her refill on time before running out.  Plan: - Start Epclusa for 6 months - F/u with [facility] 6/27  Leilynn Pilat L. Rosabella Edgin, PharmD, BCIDP, AAHIVP, CPP Clinical Pharmacist Practitioner Infectious Diseases [place] for Infectious Disease 04/10/2021, 10:44 AM

## 2021-04-16 DIAGNOSIS — Z419 Encounter for procedure for purposes other than remedying health state, unspecified: Secondary | ICD-10-CM | POA: Diagnosis not present

## 2021-04-23 ENCOUNTER — Other Ambulatory Visit (HOSPITAL_COMMUNITY): Payer: Self-pay

## 2021-04-28 ENCOUNTER — Other Ambulatory Visit (HOSPITAL_COMMUNITY): Payer: Self-pay

## 2021-04-29 ENCOUNTER — Other Ambulatory Visit (HOSPITAL_COMMUNITY): Payer: Self-pay

## 2021-04-30 ENCOUNTER — Other Ambulatory Visit (HOSPITAL_COMMUNITY): Payer: Self-pay

## 2021-05-12 ENCOUNTER — Other Ambulatory Visit: Payer: Self-pay

## 2021-05-12 ENCOUNTER — Encounter: Payer: Self-pay | Admitting: Infectious Diseases

## 2021-05-12 ENCOUNTER — Ambulatory Visit (INDEPENDENT_AMBULATORY_CARE_PROVIDER_SITE_OTHER): Payer: Medicaid Other | Admitting: Infectious Diseases

## 2021-05-12 VITALS — BP 165/73 | HR 81 | Temp 98.3°F | Wt 180.1 lb

## 2021-05-12 DIAGNOSIS — K7469 Other cirrhosis of liver: Secondary | ICD-10-CM | POA: Diagnosis not present

## 2021-05-12 DIAGNOSIS — B182 Chronic viral hepatitis C: Secondary | ICD-10-CM

## 2021-05-12 DIAGNOSIS — R600 Localized edema: Secondary | ICD-10-CM | POA: Insufficient documentation

## 2021-05-12 DIAGNOSIS — Z23 Encounter for immunization: Secondary | ICD-10-CM | POA: Diagnosis not present

## 2021-05-12 DIAGNOSIS — R609 Edema, unspecified: Secondary | ICD-10-CM | POA: Diagnosis not present

## 2021-05-12 NOTE — Patient Instructions (Addendum)
Please continue taking and filling your Epclusa every day as you have been.   Please stop by the lab on your way out to make sure your medication is working well.   Will give you your first Hepatitis B vaccine today - 2nd dose in 2 months when you return.   I think your leg swelling could be due to the new blood pressure medication you have been given. Would try some compression stockings at night while you keep your legs elevated to see if that helps to improve the swelling.  I would mention it to Dr. Margarita Rana when you follow up with her in August, or sooner if it becomes too bothersome.    Return in about 2 months (around 07/12/2021).

## 2021-05-12 NOTE — Progress Notes (Signed)
Patient Name: Samantha Evans  Date of Birth: 04-03-1961  MRN: [id number]  PCP: [name], MD  Referring Provider: [name], MD, Ph#: [phone]   CC:  FU hepatitis C infection. On Epclusa x 6 months.  Fatigue and leg swelling.    HPI: Samantha Evans is a 60 y.o. female here for follow up.   New onset leg swelling is her only concern. This is bilateral and has been progressive for 3 days now. She does forget her BP medication sometimes. Has resumed smoking cigarettes recently - cites this is due to stress. Has extra house guests that is increasing her stress at home.   Takes her epclusa everyday at 7 pm. Only side effect is fatigue.  Has any trouble or changes with her bowel movements, urine, skin.  She does feel like she is got an abdominal protrusion at times when she goes to sit up but this goes away after he does not feel like her hernia.  No pain, only discomfort.    ROS:  Constitutional: positive for fevers, chills, malaise, anorexia and weight loss Eyes: negative for icterus Cardiovascular: negative for chest pain, dyspnea, orthopnea, positive lower extremity edema Gastrointestinal: positive for reflux symptoms and abdominal pain, negative for nausea, vomiting, melena, diarrhea, constipation and jaundice Hematologic/lymphatic: negative for bleeding, lymphadenopathy and petechiae Musculoskeletal: negative for myalgias and arthralgias Neurological: negative for headaches, paresthesia, coordination problems and gait problems Behavioral/Psych: negative for excessive alcohol consumption and illegal drug usage All other systems reviewed and are negative        Past Medical History:  Diagnosis Date   Arthritis    Carpal tunnel syndrome    Cataracts, bilateral    Cirrhosis ([place])     Prior to Admission medications   Medication Sig Start Date End Date Taking? Authorizing Provider  docusate sodium (COLACE) 100 MG capsule Take 1 capsule (100 mg total) by mouth 2  (two) times daily. 03/03/21   Kathie Dike, MD  pantoprazole (PROTONIX) 40 MG tablet Take 1 tablet (40 mg total) by mouth 2 (two) times daily. 03/03/21   Kathie Dike, MD  polyethylene glycol (MIRALAX / GLYCOLAX) 17 g packet Take 17 g by mouth daily as needed for mild constipation. 03/03/21   Kathie Dike, MD  traMADol (ULTRAM) 50 MG tablet Take 1 tablet (50 mg total) by mouth every 6 (six) hours as needed for severe pain. 03/03/21   Meuth, Brooke A, PA-C    No Known Allergies  Social History   Tobacco Use   Smoking status: Former    Packs/day: 0.50    Years: 40.00    Pack years: 20.00    Types: Cigarettes    Quit date: 02/27/2021    Years since quitting: 0.2   Smokeless tobacco: Never  Substance Use Topics   Alcohol use: Not Currently    Family History  Problem Relation Age of Onset   Lung cancer Father    Liver disease Neg Hx    Colon cancer Neg Hx    Esophageal cancer Neg Hx    Pancreatic cancer Neg Hx    Stomach cancer Neg Hx     Objective:   Vitals:   05/12/21 1346  BP: (!) 165/73  Pulse: 81  Temp: 98.3 F (36.8 C)  SpO2: 97%    Constitutional: in no apparent distress, well developed and well nourished and oriented times 3 Eyes: anicteric Cardiovascular: Cor RRR and No murmurs Respiratory: clear Gastrointestinal: Bowel sounds are normal, liver is not enlarged,  spleen is not enlarged but body habitus limiting exam. Well healed surgical scar over umbilicus. Seems more like diastasis recti.  Musculoskeletal: pedal edema 1-2 + bilaterally, intact peripheral pulses Skin: negative for - jaundice, spider hemangioma, telangiectasia, palmar erythema, ecchymosis and atrophy; no porphyria cutanea tarda Lymphatic: no cervical lymphadenopathy   Laboratory: Genotype:  Lab Results  Component Value Date   HCVGENOTYPE 1a 03/25/2021   HCV viral load:  Lab Results  Component Value Date   HCVQUANT 954,000 03/02/2021   Lab Results  Component Value Date   WBC 3.9  03/25/2021   HGB 10.4 (L) 03/25/2021   HCT 30.1 (L) 03/25/2021   MCV 90.7 03/25/2021   PLT 99 (L) 03/25/2021    Lab Results  Component Value Date   CREATININE 0.84 03/25/2021   BUN 22 03/25/2021   NA 137 03/25/2021   K 4.6 03/25/2021   CL 107 03/25/2021   CO2 24 03/25/2021    Lab Results  Component Value Date   ALT 60 (H) 03/25/2021   AST 110 (H) 03/25/2021   ALKPHOS 89 02/28/2021    Lab Results  Component Value Date   INR 1.3 (H) 03/25/2021   BILITOT 2.3 (H) 03/25/2021   ALBUMIN 2.6 (L) 02/28/2021     Imaging:  MRI of liver 03/01/2021 - cirrhosis+ with portal hypertension, enlarged spleen (14 cm) with trace ascites. Recommended FU in 78m.    Assessment & Plan:   Problem List Items Addressed This Visit       Unprioritized   Peripheral edema    Suspect this is due to either uncontrolled hypertension or side effect pain.  No ascites appreciated on exam.  She has follow up with PCP in 1 month after starting on amlodipine recently. BP still high today but has not taken her medication yet. Suggested to take this when she gets home, add compression stockings at night to see if it helps decongest better.        Hepatic cirrhosis ([place])- by CT scan    FU with GI team next week for screening EGD/colonoscopy. No signs of decompensated disease. Follow up lab results and MELD today.        Chronic hepatitis C without hepatic coma (HCC) - Primary    Genotype 1b, F4 with child Pugh B.  Plan for Epclusa x6 months.  She is preliminarily tolerating this well with only mild fatigue.  She is not missed a single dose over the last 6 weeks.  We will repeat her blood work today in addition to /MELD score.  No signs clinically for any decompensation of liver.  I suspect her lower extremity edema may be due to either uncontrolled hypertension or a side effect of her current medication amlodipine.  Counseled to continue her medication every evening like she does now.  Will give first  hepatitis B vaccine today with second at upcoming follow-up in addition to the first hep a vaccine.  She will also need the pneumococcal series as well if not already gotten this. She has a follow-up appointment with [name] with GI next week.  Planning endoscopy and colonoscopy with history of cirrhosis.   Follow-up in 2 months.        Relevant Orders   Hepatitis C RNA quantitative ([facility])   COMPLETE METABOLIC PANEL WITH GFR   Protime-INR   CBC   Other Visit Diagnoses     Need for hepatitis B vaccination       Relevant Orders   Heplisav-B (HepB-CPG)  Vaccine (Completed)        Janene Madeira, MSN, NP-C [place] for Infectious Disease [email or web address] Pager: [phone] Office: [phone] [employer]: [phone]

## 2021-05-12 NOTE — Assessment & Plan Note (Signed)
Suspect this is due to either uncontrolled hypertension or side effect pain.  No ascites appreciated on exam.  She has follow up with PCP in 1 month after starting on amlodipine recently. BP still high today but has not taken her medication yet. Suggested to take this when she gets home, add compression stockings at night to see if it helps decongest better.

## 2021-05-12 NOTE — Assessment & Plan Note (Signed)
Genotype 1b, F4 with child Pugh B.  Plan for Epclusa x6 months.  She is preliminarily tolerating this well with only mild fatigue.  She is not missed a single dose over the last 6 weeks.  We will repeat her blood work today in addition to /MELD score.  No signs clinically for any decompensation of liver.  I suspect her lower extremity edema may be due to either uncontrolled hypertension or a side effect of her current medication amlodipine.  Counseled to continue her medication every evening like she does now.  Will give first hepatitis B vaccine today with second at upcoming follow-up in addition to the first hep a vaccine.  She will also need the pneumococcal series as well if not already gotten this. She has a follow-up appointment with [name] with GI next week.  Planning endoscopy and colonoscopy with history of cirrhosis.   Follow-up in 2 months.

## 2021-05-12 NOTE — Assessment & Plan Note (Signed)
FU with GI team next week for screening EGD/colonoscopy. No signs of decompensated disease. Follow up lab results and MELD today.

## 2021-05-14 LAB — COMPLETE METABOLIC PANEL WITH GFR
AG Ratio: 1 (calc) (ref 1.0–2.5)
ALT: 15 U/L (ref 6–29)
AST: 29 U/L (ref 10–35)
Albumin: 3.1 g/dL — ABNORMAL LOW (ref 3.6–5.1)
Alkaline phosphatase (APISO): 119 U/L (ref 37–153)
BUN: 21 mg/dL (ref 7–25)
CO2: 24 mmol/L (ref 20–32)
Calcium: 10.3 mg/dL (ref 8.6–10.4)
Chloride: 110 mmol/L (ref 98–110)
Creat: 0.78 mg/dL (ref 0.50–0.99)
GFR, Est African American: 96 mL/min/{1.73_m2} (ref >=60)
GFR, Est Non African American: 83 mL/min/{1.73_m2} (ref >=60)
Globulin: 3.2 g/dL (calc) (ref 1.9–3.7)
Glucose, Bld: 80 mg/dL (ref 65–99)
Potassium: 4.3 mmol/L (ref 3.5–5.3)
Sodium: 138 mmol/L (ref 135–146)
Total Bilirubin: 1.2 mg/dL (ref 0.2–1.2)
Total Protein: 6.3 g/dL (ref 6.1–8.1)

## 2021-05-14 LAB — CBC
HCT: 28.9 % — ABNORMAL LOW (ref 35.0–45.0)
Hemoglobin: 10.3 g/dL — ABNORMAL LOW (ref 11.7–15.5)
MCH: 31.4 pg (ref 27.0–33.0)
MCHC: 35.6 g/dL (ref 32.0–36.0)
MCV: 88.1 fL (ref 80.0–100.0)
MPV: 9.7 fL (ref 7.5–12.5)
Platelets: 84 10*3/uL — ABNORMAL LOW (ref 140–400)
RBC: 3.28 10*6/uL — ABNORMAL LOW (ref 3.80–5.10)
RDW: 13.2 % (ref 11.0–15.0)
WBC: 3.8 10*3/uL (ref 3.8–10.8)

## 2021-05-14 LAB — HEPATITIS C RNA QUANTITATIVE
HCV Quantitative Log: <1.18 log IU/mL — ABNORMAL HIGH
HCV RNA, PCR, QN: <15 IU/mL — ABNORMAL HIGH

## 2021-05-14 LAB — PROTIME-INR
INR: 1.2 — ABNORMAL HIGH
Prothrombin Time: 12 s — ABNORMAL HIGH (ref 9.0–11.5)

## 2021-05-16 DIAGNOSIS — Z419 Encounter for procedure for purposes other than remedying health state, unspecified: Secondary | ICD-10-CM | POA: Diagnosis not present

## 2021-05-20 ENCOUNTER — Ambulatory Visit (AMBULATORY_SURGERY_CENTER): Payer: Medicaid Other | Admitting: Gastroenterology

## 2021-05-20 ENCOUNTER — Other Ambulatory Visit: Payer: Self-pay

## 2021-05-20 ENCOUNTER — Encounter: Payer: Self-pay | Admitting: Gastroenterology

## 2021-05-20 ENCOUNTER — Telehealth: Payer: Self-pay | Admitting: *Deleted

## 2021-05-20 VITALS — BP 104/82 | HR 66 | Temp 99.1°F | Resp 22 | Ht 61.0 in | Wt 184.0 lb

## 2021-05-20 DIAGNOSIS — Z1211 Encounter for screening for malignant neoplasm of colon: Secondary | ICD-10-CM

## 2021-05-20 DIAGNOSIS — K7460 Unspecified cirrhosis of liver: Secondary | ICD-10-CM

## 2021-05-20 DIAGNOSIS — B1920 Unspecified viral hepatitis C without hepatic coma: Secondary | ICD-10-CM

## 2021-05-20 DIAGNOSIS — K319 Disease of stomach and duodenum, unspecified: Secondary | ICD-10-CM

## 2021-05-20 DIAGNOSIS — I8510 Secondary esophageal varices without bleeding: Secondary | ICD-10-CM

## 2021-05-20 MED ORDER — SUPREP BOWEL PREP KIT 17.5-3.13-1.6 GM/177ML PO SOLN: 1.0000 | Freq: Once | ORAL | 0 refills | Status: DC

## 2021-05-20 MED ORDER — SODIUM CHLORIDE 0.9 % IV SOLN
500.0000 mL | Freq: Once | INTRAVENOUS | Status: DC
Start: 2021-05-20 — End: 2021-05-20

## 2021-05-20 NOTE — Patient Instructions (Addendum)
YOU HAD AN ENDOSCOPIC PROCEDURE TODAY AT [facility]:   Refer to the procedure report that was given to you for any specific questions about what was found during the examination.  If the procedure report does not answer your questions, please call your gastroenterologist to clarify.  If you requested that your care partner not be given the details of your procedure findings, then the procedure report has been included in a sealed envelope for you to review at your convenience later.  YOU SHOULD EXPECT: Some feelings of bloating in the abdomen. Passage of more gas than usual.  Walking can help get rid of the air that was put into your GI tract during the procedure and reduce the bloating. If you had a lower endoscopy (such as a colonoscopy or flexible sigmoidoscopy) you may notice spotting of blood in your stool or on the toilet paper. If you underwent a bowel prep for your procedure, you may not have a normal bowel movement for a few days.  Please Note:  You might notice some irritation and congestion in your nose or some drainage.  This is from the oxygen used during your procedure.  There is no need for concern and it should clear up in a day or so.  SYMPTOMS TO REPORT IMMEDIATELY:    Following upper endoscopy (EGD)  Vomiting of blood or coffee ground material  New chest pain or pain under the shoulder blades  Painful or persistently difficult swallowing  New shortness of breath  Fever of 100F or higher  Black, tarry-looking stools  For urgent or emergent issues, a gastroenterologist can be reached at any hour by calling [phone]. Do not use MyChart messaging for urgent concerns.    DIET:  We do recommend a small meal at first, but then you may proceed to your regular diet.  Drink plenty of fluids but you should avoid alcoholic beverages for 24 hours.  ACTIVITY:  You should plan to take it easy for the rest of today and you should NOT DRIVE or use heavy machinery  until tomorrow (because of the sedation medicines used during the test).    FOLLOW UP: Our staff will call the number listed on your records 48-72 hours following your procedure to check on you and address any questions or concerns that you may have regarding the information given to you following your procedure. If we do not reach you, we will leave a message.  We will attempt to reach you two times.  During this call, we will ask if you have developed any symptoms of COVID 19. If you develop any symptoms (ie: fever, flu-like symptoms, shortness of breath, cough etc.) before then, please call [phone].  If you test positive for Covid 19 in the 2 weeks post procedure, please call and report this information to [place].    If any biopsies were taken you will be contacted by phone or by letter within the next 1-3 weeks.  Please call us at [phone] if you have not heard about the biopsies in 3 weeks.    SIGNATURES/CONFIDENTIALITY: You and/or your care partner have signed paperwork which will be entered into your electronic medical record.  These signatures attest to the fact that that the information above on your After Visit Summary has been reviewed and is understood.  Full responsibility of the confidentiality of this discharge information lies with you and/or your care-partner.    Resume medications. Follow up with [name] as scheduled. Please  pick up prep for colonoscopy on 06/11/21 at pharmacy.

## 2021-05-20 NOTE — Progress Notes (Signed)
pt tolerated well. VSS. awake and to recovery. Report given to RN.  

## 2021-05-20 NOTE — Op Note (Signed)
[facility] Patient Name: Samantha Evans Procedure Date: 05/20/2021 11:07 AM MRN: [id number] Endoscopist: [name] MD, MD Age: 60 Referring MD:  Date of Birth: 12-May-1961 Gender: Female Account #: [id number] Procedure:                Upper GI endoscopy Indications:              New diagnosis of cirrhosis with underlying portal                            hypertension and suspected esophageal varices Medicines:                Monitored Anesthesia Care Procedure:                Pre-Anesthesia Assessment:                           - Prior to the procedure, a History and Physical                            was performed, and patient medications and                            allergies were reviewed. The patient's tolerance of                            previous anesthesia was also reviewed. The risks                            and benefits of the procedure and the sedation                            options and risks were discussed with the patient.                            All questions were answered, and informed consent                            was obtained. Prior Anticoagulants: The patient has                            taken no previous anticoagulant or antiplatelet                            agents. ASA Grade Assessment: III - A patient with                            severe systemic disease. After reviewing the risks                            and benefits, the patient was deemed in                            satisfactory condition to undergo the procedure.  After obtaining informed consent, the endoscope was                            passed under direct vision. Throughout the                            procedure, the patient's blood pressure, pulse, and                            oxygen saturations were monitored continuously. The                            GIF [id number] was introduced through the                            mouth,  and advanced to the third part of duodenum.                            The upper GI endoscopy was accomplished without                            difficulty. The patient tolerated the procedure                            well. Scope In: Scope Out: Findings:                 Three columns of grade I varices were found in the                            lower third of the esophagus. No ring, web, or                            esophagitis. The z-line is located 34 cm from the                            incisors.                           Severe portal hypertensive gastropathy was found                            throughout the stomach. No gastric varices.                           The examined duodenum was normal. Complications:            No immediate complications. Estimated blood loss:                            Minimal. Estimated Blood Loss:     Estimated blood loss was minimal. Impression:               - Grade I esophageal varices.                           -  Portal hypertensive gastropathy.                           - Normal examined duodenum.                           - No specimens collected. Recommendation:           - Patient has a contact number available for                            emergencies. The signs and symptoms of potential                            delayed complications were discussed with the                            patient. Return to normal activities tomorrow.                            Written discharge instructions were provided to the                            patient.                           - Resume previous diet.                           - Continue present medications.                           - Follow-up in the office as previously planned                            06/23/21.                           - Concurrent colonoscopy scheduled for today will                            need to be rescheduled as she did not complete the                             bowel prep. [name] MD, MD 05/20/2021 11:30:00 AM This report has been signed electronically.

## 2021-05-20 NOTE — Telephone Encounter (Signed)
Pt is going to have an EGD only today.  Colonoscopy scheduled for 06-11-21 at 8:00.  New instructions given to pt, amb gi referral put in, and prep sent to pharmacy.  F/U OV made for 06-23-21 at 950

## 2021-05-22 ENCOUNTER — Telehealth: Payer: Self-pay | Admitting: *Deleted

## 2021-05-22 ENCOUNTER — Telehealth: Payer: Self-pay

## 2021-05-22 NOTE — Telephone Encounter (Signed)
Unable to leave message. Mailbox is full ?

## 2021-05-22 NOTE — Telephone Encounter (Signed)
Unable to leave message mailbox full.

## 2021-05-27 ENCOUNTER — Other Ambulatory Visit (HOSPITAL_COMMUNITY): Payer: Self-pay

## 2021-05-28 ENCOUNTER — Other Ambulatory Visit (HOSPITAL_COMMUNITY): Payer: Self-pay

## 2021-06-04 ENCOUNTER — Other Ambulatory Visit: Payer: Self-pay

## 2021-06-04 ENCOUNTER — Other Ambulatory Visit (HOSPITAL_COMMUNITY): Payer: Self-pay

## 2021-06-05 ENCOUNTER — Ambulatory Visit: Payer: Medicaid Other | Admitting: Gastroenterology

## 2021-06-09 ENCOUNTER — Other Ambulatory Visit (HOSPITAL_COMMUNITY): Payer: Self-pay

## 2021-06-11 ENCOUNTER — Other Ambulatory Visit: Payer: Self-pay

## 2021-06-11 ENCOUNTER — Encounter: Payer: Medicaid Other | Admitting: Gastroenterology

## 2021-06-11 ENCOUNTER — Encounter: Payer: Self-pay | Admitting: Gastroenterology

## 2021-06-11 ENCOUNTER — Ambulatory Visit (AMBULATORY_SURGERY_CENTER): Payer: Medicaid Other | Admitting: Gastroenterology

## 2021-06-11 VITALS — BP 119/75 | HR 70 | Temp 99.6°F | Resp 22 | Ht 61.0 in | Wt 184.0 lb

## 2021-06-11 DIAGNOSIS — D125 Benign neoplasm of sigmoid colon: Secondary | ICD-10-CM

## 2021-06-11 DIAGNOSIS — Z1211 Encounter for screening for malignant neoplasm of colon: Secondary | ICD-10-CM | POA: Diagnosis not present

## 2021-06-11 DIAGNOSIS — B1920 Unspecified viral hepatitis C without hepatic coma: Secondary | ICD-10-CM

## 2021-06-11 DIAGNOSIS — K7460 Unspecified cirrhosis of liver: Secondary | ICD-10-CM

## 2021-06-11 MED ORDER — SODIUM CHLORIDE 0.9 % IV SOLN: 500.0000 mL | Freq: Once | INTRAVENOUS | Status: DC

## 2021-06-11 MED ORDER — METAMUCIL SMOOTH TEXTURE 58.6 % PO POWD: 1.0000 | Freq: Every day | ORAL | 12 refills | Status: DC

## 2021-06-11 NOTE — Progress Notes (Signed)
To pacu, VSS. Report to rn.tb °

## 2021-06-11 NOTE — Progress Notes (Signed)
VS by Hunter  I have reviewed the patient's medical history in detail and updated the computerized patient record.  

## 2021-06-11 NOTE — Op Note (Addendum)
[facility] Patient Name: Samantha Evans Procedure Date: 06/11/2021 7:18 AM MRN: [id number] Endoscopist: [name] MD, MD Age: 60 Referring MD:  Date of Birth: August 15, 1961 Gender: Female Account #: [id number] Procedure:                Colonoscopy Indications:              Screening for colorectal malignant neoplasm, This                            is the patient's first colonoscopy Medicines:                Monitored Anesthesia Care Procedure:                Pre-Anesthesia Assessment:                           - Prior to the procedure, a History and Physical                            was performed, and patient medications and                            allergies were reviewed. The patient's tolerance of                            previous anesthesia was also reviewed. The risks                            and benefits of the procedure and the sedation                            options and risks were discussed with the patient.                            All questions were answered, and informed consent                            was obtained. Prior Anticoagulants: The patient has                            taken no previous anticoagulant or antiplatelet                            agents. ASA Grade Assessment: II - A patient with                            mild systemic disease. After reviewing the risks                            and benefits, the patient was deemed in                            satisfactory condition to undergo the procedure.  After obtaining informed consent, the colonoscope                            was passed under direct vision. Throughout the                            procedure, the patient's blood pressure, pulse, and                            oxygen saturations were monitored continuously. The                            CF [id number] was introduced through the anus                            and advanced to the  the cecum, identified by                            appendiceal orifice and ileocecal valve. A second                            forward view of the right colon was performed. The                            colonoscopy was performed without difficulty. The                            patient tolerated the procedure well. The quality                            of the bowel preparation was adequate. The                            ileocecal valve, appendiceal orifice, and rectum                            were photographed. Scope In: 8:00:35 AM Scope Out: 8:25:47 AM Scope Withdrawal Time: 0 hours 20 minutes 41 seconds  Total Procedure Duration: 0 hours 25 minutes 12 seconds  Findings:                 Hemorrhoids were found on perianal exam.                           Non-bleeding internal hemorrhoids were found.                           Multiple small and large-mouthed diverticula were                            found in the sigmoid colon and descending colon.                           A 15 mm polyp was found in the sigmoid colon. The  polyp was pedunculated and originally folded over                            such that the margins of the polyp were unclear. I                            biopsied the polyp to try to reposition it. At that                            point, it was clearly a pedunculated adenoma. The                            polyp was removed with a hot snare. Resection and                            retrieval were complete. Estimated blood loss was                            minimal.                           A 3 mm polyp was found in the sigmoid colon. The                            polyp was sessile. The polyp was removed with a                            cold snare. Resection and retrieval were complete.                            Estimated blood loss was minimal.                           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 The exam was otherwise without                            abnormality on direct and retroflexion views. Complications:            No immediate complications. Estimated blood loss:                            Minimal. Estimated Blood Loss:     Estimated blood loss was minimal. Impression:               - Hemorrhoids found on perianal exam.                           - Non-bleeding internal hemorrhoids.                           - Diverticulosis in the sigmoid colon and in the  descending colon.                           - One 15 mm polyp in the sigmoid colon, biopsied                            and then removed with a hot snare. Resected and                            retrieved.                           - One 3 mm polyp in the sigmoid colon, removed with                            a cold snare. Resected and retrieved.                           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                           - The examination was otherwise normal on direct                            and retroflexion views. Recommendation:           - Patient has a contact number available for                            emergencies. The signs and symptoms of potential                            delayed complications were discussed with the                            patient. Return to normal activities tomorrow.                            Written discharge instructions were provided to the                            patient.                           - Resume previous diet.                           - Continue present medications.                           - Await pathology results.                           - Repeat colonoscopy date to be determined after  pending pathology results are reviewed for                            surveillance.                           - Follow a high fiber diet. Drink at least 64                             ounces of water daily. Add a daily stool bulking                            agent such as psyllium (an exampled would be                            Metamucil).                           - Emerging evidence supports eating a diet of                            fruits, vegetables, grains, calcium, and yogurt                            while reducing red meat and alcohol may reduce the                            risk of colon cancer.                           - Thank you for allowing me to be involved in your                            colon cancer prevention. [name] MD, MD 06/11/2021 8:37:53 AM This report has been signed electronically.

## 2021-06-11 NOTE — Patient Instructions (Signed)
Handouts given:  polyps, diverticulosis, high fiber, hemorrhoids Resume previous diet Continue current  medications Start metamucil to your daily diet  YOU HAD AN ENDOSCOPIC PROCEDURE TODAY AT [facility]:   Refer to the procedure report that was given to you for any specific questions about what was found during the examination.  If the procedure report does not answer your questions, please call your gastroenterologist to clarify.  If you requested that your care partner not be given the details of your procedure findings, then the procedure report has been included in a sealed envelope for you to review at your convenience later.  YOU SHOULD EXPECT: Some feelings of bloating in the abdomen. Passage of more gas than usual.  Walking can help get rid of the air that was put into your GI tract during the procedure and reduce the bloating. If you had a lower endoscopy (such as a colonoscopy or flexible sigmoidoscopy) you may notice spotting of blood in your stool or on the toilet paper. If you underwent a bowel prep for your procedure, you may not have a normal bowel movement for a few days.  Please Note:  You might notice some irritation and congestion in your nose or some drainage.  This is from the oxygen used during your procedure.  There is no need for concern and it should clear up in a day or so.  SYMPTOMS TO REPORT IMMEDIATELY:  Following lower endoscopy (colonoscopy or flexible sigmoidoscopy):  Excessive amounts of blood in the stool  Significant tenderness or worsening of abdominal pains  Swelling of the abdomen that is new, acute  Fever of 100F or higher  For urgent or emergent issues, a gastroenterologist can be reached at any hour by calling [phone]. Do not use MyChart messaging for urgent concerns.   DIET:  We do recommend a small meal at first, but then you may proceed to your regular diet.  Drink plenty of fluids but you should avoid alcoholic beverages  for 24 hours.  ACTIVITY:  You should plan to take it easy for the rest of today and you should NOT DRIVE or use heavy machinery until tomorrow (because of the sedation medicines used during the test).    FOLLOW UP: Our staff will call the number listed on your records 48-72 hours following your procedure to check on you and address any questions or concerns that you may have regarding the information given to you following your procedure. If we do not reach you, we will leave a message.  We will attempt to reach you two times.  During this call, we will ask if you have developed any symptoms of COVID 19. If you develop any symptoms (ie: fever, flu-like symptoms, shortness of breath, cough etc.) before then, please call [phone].  If you test positive for Covid 19 in the 2 weeks post procedure, please call and report this information to [place].    If any biopsies were taken you will be contacted by phone or by letter within the next 1-3 weeks.  Please call us at [phone] if you have not heard about the biopsies in 3 weeks.   SIGNATURES/CONFIDENTIALITY: You and/or your care partner have signed paperwork which will be entered into your electronic medical record.  These signatures attest to the fact that that the information above on your After Visit Summary has been reviewed and is understood.  Full responsibility of the confidentiality of this discharge information lies with you and/or your care-partner.

## 2021-06-11 NOTE — Progress Notes (Signed)
Called to room to assist during endoscopic procedure.  Patient ID and intended procedure confirmed with present staff. Received instructions for my participation in the procedure from the performing physician.  

## 2021-06-13 ENCOUNTER — Telehealth: Payer: Self-pay

## 2021-06-13 NOTE — Telephone Encounter (Signed)
Called # [phone] and could not leave a message we tried to reach pt for a follow up call. The mailbox was full. maw

## 2021-06-13 NOTE — Telephone Encounter (Signed)
Patient returned the call and said she is well no questions at this time.

## 2021-06-13 NOTE — Telephone Encounter (Signed)
Mailbox is full - unable to leave message on 2nd follow up call.

## 2021-06-19 ENCOUNTER — Other Ambulatory Visit (HOSPITAL_COMMUNITY): Payer: Self-pay

## 2021-06-23 ENCOUNTER — Other Ambulatory Visit (INDEPENDENT_AMBULATORY_CARE_PROVIDER_SITE_OTHER): Payer: Medicaid Other

## 2021-06-23 ENCOUNTER — Ambulatory Visit (INDEPENDENT_AMBULATORY_CARE_PROVIDER_SITE_OTHER): Payer: Medicaid Other | Admitting: Gastroenterology

## 2021-06-23 ENCOUNTER — Other Ambulatory Visit (HOSPITAL_COMMUNITY): Payer: Self-pay

## 2021-06-23 ENCOUNTER — Other Ambulatory Visit: Payer: Self-pay

## 2021-06-23 ENCOUNTER — Encounter: Payer: Self-pay | Admitting: Gastroenterology

## 2021-06-23 VITALS — BP 158/60 | HR 76 | Ht 60.0 in | Wt 185.5 lb

## 2021-06-23 DIAGNOSIS — R609 Edema, unspecified: Secondary | ICD-10-CM

## 2021-06-23 DIAGNOSIS — K766 Portal hypertension: Secondary | ICD-10-CM

## 2021-06-23 DIAGNOSIS — K7031 Alcoholic cirrhosis of liver with ascites: Secondary | ICD-10-CM | POA: Diagnosis not present

## 2021-06-23 DIAGNOSIS — I8500 Esophageal varices without bleeding: Secondary | ICD-10-CM

## 2021-06-23 DIAGNOSIS — R932 Abnormal findings on diagnostic imaging of liver and biliary tract: Secondary | ICD-10-CM

## 2021-06-23 LAB — COMPREHENSIVE METABOLIC PANEL
ALT: 26 U/L (ref 0–35)
AST: 47 U/L — ABNORMAL HIGH (ref 0–37)
Albumin: 3.4 g/dL — ABNORMAL LOW (ref 3.5–5.2)
Alkaline Phosphatase: 130 U/L — ABNORMAL HIGH (ref 39–117)
BUN: 13 mg/dL (ref 6–23)
CO2: 23 mEq/L (ref 19–32)
Calcium: 10.5 mg/dL (ref 8.4–10.5)
Chloride: 108 mEq/L (ref 96–112)
Creatinine, Ser: 0.7 mg/dL (ref 0.40–1.20)
GFR: 93.95 mL/min (ref >60.00)
Glucose, Bld: 100 mg/dL — ABNORMAL HIGH (ref 70–99)
Potassium: 4 mEq/L (ref 3.5–5.1)
Sodium: 137 mEq/L (ref 135–145)
Total Bilirubin: 1.4 mg/dL — ABNORMAL HIGH (ref 0.2–1.2)
Total Protein: 6.9 g/dL (ref 6.0–8.3)

## 2021-06-23 MED ORDER — SPIRONOLACTONE 50 MG PO TABS: 50.0000 mg | ORAL_TABLET | Freq: Every day | ORAL | 5 refills | Status: DC

## 2021-06-23 NOTE — Patient Instructions (Addendum)
I recommend that you minimize all of the sodium in your diet. Do not eat more than 2000 mg of sodium daily. The salt in your diet is driving the swelling in your legs. I recommend a high protein diet from primarily plant-based sources. Avoid red meat. Do not eat raw or undercooked meat, seafood, or shellfish.   I recommend starting aldactone 50 mg daily for the fluid. We will need to monitor your kidneys while you are on therapy for safety. Please have labs done today and again 2 weeks after starting the new medication.  I recommend 30 minutes of aerobic and resistance exercise at least 3 days a week. Try chasing your dog around the neighborhood.  Please review your vaccines/immunizations with the infectious disease specialist.  You should have another MRI of your liver in October for close monitoring of the shape and size of your liver. With cirrhosis, there is always a small risk for liver cancer. We should plan pictures at least every 6 months to make sure that you do not develop any small growths that could be cancer.  You will be contacted by [facility] in the next 2 days to arrange a your MRI of your liver for October.  The number on your caller ID will be [phone], please answer when they call.  If you have not heard from them in 2 days please call [phone] to schedule.     I'd like to see you again in October after your next MRI.  Please continue to see Dr. Margarita Rana as scheduled.

## 2021-06-23 NOTE — Progress Notes (Signed)
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 Abnormal liver imaging    - CT 4/22: 2.1 cm density in the right hepatic lobe    - MRI 02/27/21 - likely perfusion anomaly, repeat in 6 months recommended Mildly elevated AFP 12 03/06/21 LE edema on diuretics, no history of ascites by exam or imaging Small esophageal varices and portal hypertensive gastropathy on EGD 06/11/21 Mild coagulopathy with INR 1,2 Prior cholecystectomy Diverticulosis Incarcerated umbilical hernia repair [id number] Two polyps removed on recent colonoscopy 06/11/21    - awaiting pathology results 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 Margarita Rana - Follow-up with ID to complete HCV treatment - Referral to consider liver transplant with evidence for disease progression/decompensation   Please see the "Patient Instructions" section for addition details about the plan.  HPI: Samantha Evans is a 60 y.o. female initially seen by Nicoletta Ba. This is my first office visit with Samantha Evans. The history is obtained through the patient and review of her electronic health record. She has cirrhosis due to HCV currently on Epclusa. Recent labs show a MELD score of 11. Umbilical hernia repair [id number]. CT at that time showed cirrhosis and a 2.1cm density in the right hepatic lobe, trace ascites, and splenomegaly. Follow-up MRI showed no HCC but there was a  perfusion anomaly. Repeat recommended in 6 months.  Developed lower extremity edema earlier this year. No other history of decompensation.  EGD 05/20/21: 3 colums of grade 1 esophageal varices, severe portal hypertensive gastropathy Screening colonoscopy 06/11/21: One 15 mm polyp in the sigmoid colon. One 3 mm polyp in the sigmoid colon. 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 Path results not available.   No beer, wine, liquor or non-alcoholic beer except a wine cooler once or twice a year. No history of heavy drinking. Crack cocaine but none in 15 years. Was able to quit due to faith.   No seasonal flu vaccine, Covid vaccine, and she is unsure about vaccination status for HAV and HBV. She has not had the Pneumovax.   No known family history of colon cancer or polyps. No family history of uterine/endometrial cancer, pancreatic cancer or gastric/stomach cancer.   Past Medical History:  Diagnosis Date   Arthritis    Carpal tunnel syndrome    Cataracts, bilateral    Cirrhosis ([place])    Hypertension     Past Surgical History:  Procedure Laterality Date   ERCP     UMBILICAL HERNIA REPAIR N/A 02/27/2021   Procedure: REPAIR INCARCERATED UMBILICAL HERNIA;  Surgeon: Georganna Skeans, MD;  Location: [facility];  Service: General;  Laterality: N/A;    Current Outpatient Medications  Medication Sig Dispense Refill   amLODipine (NORVASC) 5 MG tablet Take 1 tablet (5 mg total) by mouth daily. 90 tablet 1   psyllium (METAMUCIL SMOOTH TEXTURE) 58.6 % powder Take 1 packet by mouth daily. 283 g 12  Sofosbuvir-Velpatasvir (EPCLUSA) 400-100 MG TABS Take 1 tablet by mouth daily. 28 tablet 5   No current facility-administered medications for this visit.    Allergies as of 06/23/2021   (No Known Allergies)       Physical Exam: General:   Alert, in NAD. No scleral icterus. No bilateral temporal wasting.  Heart:  Regular rate and rhythm; no murmurs Pulm: Clear anteriorly; no wheezing Abdomen:  Soft.  Nontender. Nondistended. Normal bowel sounds. No rebound or guarding. No fluid wave.  LAD: No inguinal or umbilical LAD Extremities:  1-2+ LE edema. Neurologic:  Alert and  oriented x4;  grossly normal neurologically; no asterixis or clonus. Skin: No jaundice. Spider angioma on the chest wall. No palmar erythema.  Psych:  Alert and cooperative. Normal mood and affect.    Tyrel Lex L. Tarri Glenn, MD, MPH 06/23/2021, 10:15 AM

## 2021-06-27 ENCOUNTER — Other Ambulatory Visit (HOSPITAL_COMMUNITY): Payer: Self-pay

## 2021-07-02 ENCOUNTER — Telehealth: Payer: Self-pay | Admitting: Family Medicine

## 2021-07-02 ENCOUNTER — Other Ambulatory Visit (HOSPITAL_COMMUNITY): Payer: Self-pay

## 2021-07-02 ENCOUNTER — Telehealth: Payer: Self-pay

## 2021-07-02 NOTE — Telephone Encounter (Signed)
[facility] Patient Advocate Encounter  [facility] and I have been unsuccsessful in reaching patient to be able to refill medication.  Raeanne Gathers)  We have tried multiple times without a response.  Ileene Patrick, [facility] Patient [facility] for Infectious Disease Phone: [phone] Fax:  [phone]

## 2021-07-02 NOTE — Telephone Encounter (Signed)
..   Medicaid Managed Care   Unsuccessful Outreach Note  07/02/2021 Name: Samantha Evans MRN: [id number] DOB: [birth year]  Referred by: [name], MD Reason for referral : High Risk Managed Medicaid (I called the patient today to offer her an phone visit with the [facility] Team. She did not answer and her VM was full.)   An unsuccessful telephone outreach was attempted today. The patient was referred to the case management team for assistance with care management and care coordination.   Follow Up Plan: The care management team will reach out to the patient again over the next 7-14 days.   [place]

## 2021-07-03 ENCOUNTER — Encounter: Payer: Self-pay | Admitting: Family Medicine

## 2021-07-03 ENCOUNTER — Ambulatory Visit: Payer: Medicaid Other | Attending: Family Medicine | Admitting: Family Medicine

## 2021-07-03 ENCOUNTER — Other Ambulatory Visit: Payer: Self-pay

## 2021-07-03 VITALS — BP 146/72 | HR 75 | Ht 60.0 in | Wt 185.0 lb

## 2021-07-03 DIAGNOSIS — H268 Other specified cataract: Secondary | ICD-10-CM | POA: Diagnosis not present

## 2021-07-03 DIAGNOSIS — I10 Essential (primary) hypertension: Secondary | ICD-10-CM | POA: Diagnosis not present

## 2021-07-03 DIAGNOSIS — H269 Unspecified cataract: Secondary | ICD-10-CM | POA: Diagnosis not present

## 2021-07-03 DIAGNOSIS — K766 Portal hypertension: Secondary | ICD-10-CM | POA: Diagnosis not present

## 2021-07-03 DIAGNOSIS — Z79899 Other long term (current) drug therapy: Secondary | ICD-10-CM | POA: Insufficient documentation

## 2021-07-03 DIAGNOSIS — K439 Ventral hernia without obstruction or gangrene: Secondary | ICD-10-CM

## 2021-07-03 DIAGNOSIS — K5730 Diverticulosis of large intestine without perforation or abscess without bleeding: Secondary | ICD-10-CM | POA: Insufficient documentation

## 2021-07-03 DIAGNOSIS — K56609 Unspecified intestinal obstruction, unspecified as to partial versus complete obstruction: Secondary | ICD-10-CM | POA: Diagnosis not present

## 2021-07-03 DIAGNOSIS — K7460 Unspecified cirrhosis of liver: Secondary | ICD-10-CM | POA: Insufficient documentation

## 2021-07-03 MED ORDER — AMLODIPINE BESYLATE 10 MG PO TABS: 10.0000 mg | ORAL_TABLET | Freq: Every day | ORAL | 6 refills | Status: DC

## 2021-07-03 NOTE — Patient Instructions (Signed)

## 2021-07-03 NOTE — Progress Notes (Signed)
Subjective:  Patient ID: Samantha Evans, female    DOB: 1961-03-24  Age: 60 y.o. MRN: [id number]  CC: Hypertension   HPI Samantha Evans is a 60 y.o. year old female with a history of hepatitis C, incarcerated umbilical hernia repair, hypertension.  Interval History: She has felt a knot in her supraumbilical region 0000000 ever since she had surgery. She has no nausea or vomiting. CT abdomen and pelvis from 02/2021 revealed: IMPRESSION: 1. Small-bowel obstruction with transition point at the entry site of an umbilical hernia. Associated closed loop obstruction of a short segment of small bowel within the umbilical hernia. Concern for ischemia and incarceration of the bowel loop within the hernia. Recommend emergent surgical consultation. 2. Cirrhosis with portal hypertension. A 2.1cm indetemriante fluid density lesion with otherwise markedly limited evaluation for focal hepatic lesion on this single phase portal venous study. Recommend MRI liver protocol for further evaluation. 3. Couple of other tiny ventral wall hernia containing fat and free fluid. 4. Scattered sigmoid diverticulosis with no acute diverticulitis. 5. Trace ascites.  She requests referral to Ophthalmology due to blurry vision as a result of cataracts and she is ready for surgery. At her last visit she was commenced on amlodipine and blood pressure is slightly elevated but improved compared to previous visit.  She endorses compliance with amlodipine. Past Medical History:  Diagnosis Date   Arthritis    Carpal tunnel syndrome    Cataracts, bilateral    Cirrhosis ([place])    Hypertension     Past Surgical History:  Procedure Laterality Date   ERCP     UMBILICAL HERNIA REPAIR N/A 02/27/2021   Procedure: REPAIR INCARCERATED UMBILICAL HERNIA;  Surgeon: Georganna Skeans, MD;  Location: [facility];  Service: General;  Laterality: N/A;    Family History  Problem Relation Age of Onset   Lung cancer Father    Liver disease  Neg Hx    Colon cancer Neg Hx    Esophageal cancer Neg Hx    Pancreatic cancer Neg Hx    Stomach cancer Neg Hx     No Known Allergies  Outpatient Medications Prior to Visit  Medication Sig Dispense Refill   psyllium (METAMUCIL SMOOTH TEXTURE) 58.6 % powder Take 1 packet by mouth daily. 283 g 12   Sofosbuvir-Velpatasvir (EPCLUSA) 400-100 MG TABS Take 1 tablet by mouth daily. 28 tablet 5   spironolactone (ALDACTONE) 50 MG tablet Take 1 tablet (50 mg total) by mouth daily. 30 tablet 5   amLODipine (NORVASC) 5 MG tablet Take 1 tablet (5 mg total) by mouth daily. 90 tablet 1   No facility-administered medications prior to visit.     ROS Review of Systems  Constitutional:  Negative for activity change, appetite change and fatigue.  HENT:  Negative for congestion, sinus pressure and sore throat.   Eyes:  Negative for visual disturbance.  Respiratory:  Negative for cough, chest tightness, shortness of breath and wheezing.   Cardiovascular:  Negative for chest pain and palpitations.  Gastrointestinal:  Positive for abdominal pain. Negative for abdominal distention and constipation.  Endocrine: Negative for polydipsia.  Genitourinary:  Negative for dysuria and frequency.  Musculoskeletal:  Negative for arthralgias and back pain.  Skin:  Negative for rash.  Neurological:  Negative for tremors, light-headedness and numbness.  Hematological:  Does not bruise/bleed easily.  Psychiatric/Behavioral:  Negative for agitation and behavioral problems.    Objective:  BP (!) 146/72   Pulse 75   Ht 5' (1.524 m)  Wt 185 lb (83.9 kg)   SpO2 99%   BMI 36.13 kg/m   BP/Weight 07/03/2021 06/23/2021 0000000  Systolic BP 123456 0000000 123456  Diastolic BP 72 60 75  Wt. (Lbs) 185 185.5 184  BMI 36.13 36.23 34.77      Physical Exam Constitutional:      Appearance: She is well-developed.  Cardiovascular:     Rate and Rhythm: Normal rate.     Heart sounds: Normal heart sounds. No murmur  heard. Pulmonary:     Effort: Pulmonary effort is normal.     Breath sounds: Normal breath sounds. No wheezing or rales.  Chest:     Chest wall: No tenderness.  Abdominal:     General: Bowel sounds are normal. There is no distension.     Palpations: Abdomen is soft. There is no mass.     Tenderness: There is no abdominal tenderness.     Hernia: A hernia (ventral) is present.  Musculoskeletal:        General: Normal range of motion.     Right lower leg: No edema.     Left lower leg: No edema.  Neurological:     Mental Status: She is alert and oriented to person, place, and time.  Psychiatric:        Mood and Affect: Mood normal.    CMP Latest Ref Rng & Units 06/23/2021 05/12/2021 03/25/2021  Glucose 70 - 99 mg/dL 100(H) 80 68  BUN 6 - 23 mg/dL '13 21 22  '$ Creatinine 0.40 - 1.20 mg/dL 0.70 0.78 0.84  Sodium 135 - 145 mEq/L 137 138 137  Potassium 3.5 - 5.1 mEq/L 4.0 4.3 4.6  Chloride 96 - 112 mEq/L 108 110 107  CO2 19 - 32 mEq/L '23 24 24  '$ Calcium 8.4 - 10.5 mg/dL 10.5 10.3 10.0  Total Protein 6.0 - 8.3 g/dL 6.9 6.3 6.4  Total Bilirubin 0.2 - 1.2 mg/dL 1.4(H) 1.2 2.3(H)  Alkaline Phos 39 - 117 U/L 130(H) - -  AST 0 - 37 U/L 47(H) 29 110(H)  ALT 0 - 35 U/L 26 15 60(H)    Lipid Panel  No results found for: CHOL, TRIG, HDL, CHOLHDL, VLDL, LDLCALC, LDLDIRECT  CBC    Component Value Date/Time   WBC 3.8 05/12/2021 1415   RBC 3.28 (L) 05/12/2021 1415   HGB 10.3 (L) 05/12/2021 1415   HCT 28.9 (L) 05/12/2021 1415   PLT 84 (L) 05/12/2021 1415   MCV 88.1 05/12/2021 1415   MCH 31.4 05/12/2021 1415   MCHC 35.6 05/12/2021 1415   RDW 13.2 05/12/2021 1415   LYMPHSABS 1.1 02/27/2021 1345   MONOABS 0.5 02/27/2021 1345   EOSABS 0.1 02/27/2021 1345   BASOSABS 0.0 02/27/2021 1345    No results found for: HGBA1C  Assessment & Plan:  1. Essential hypertension Suboptimally controlled Increased dose of amlodipine Counseled on blood pressure goal of less than 130/80, low-sodium, DASH  diet, medication compliance, 150 minutes of moderate intensity exercise per week. Discussed medication compliance, adverse effects. - amLODipine (NORVASC) 10 MG tablet; Take 1 tablet (10 mg total) by mouth daily.  Dispense: 30 tablet; Refill: 6  2. Ventral hernia without obstruction or gangrene Hernia is painful If symptoms worsen she will need to go to the ED-at this time no evidence of obstruction or incarceration. - Ambulatory referral to General Surgery  3. Other cataract of both eyes - Ambulatory referral to Ophthalmology    Meds ordered this encounter  Medications   amLODipine (NORVASC) 10  MG tablet    Sig: Take 1 tablet (10 mg total) by mouth daily.    Dispense:  30 tablet    Refill:  6    Dose change    Follow-up: Return in about 1 month (around 08/03/2021) for Complete physical exam.       [name], MD, FAAFP. [facility] [place], [place]   07/03/2021, 10:13 AM

## 2021-07-03 NOTE — Progress Notes (Signed)
States she feels a knot in her abdomen.

## 2021-07-07 ENCOUNTER — Other Ambulatory Visit: Payer: Self-pay

## 2021-07-07 DIAGNOSIS — Z86010 Personal history of colonic polyps: Secondary | ICD-10-CM

## 2021-07-10 ENCOUNTER — Other Ambulatory Visit (HOSPITAL_COMMUNITY): Payer: Self-pay

## 2021-07-11 ENCOUNTER — Other Ambulatory Visit (HOSPITAL_COMMUNITY): Payer: Self-pay

## 2021-07-11 ENCOUNTER — Ambulatory Visit: Payer: Medicaid Other | Admitting: Surgery

## 2021-07-14 ENCOUNTER — Other Ambulatory Visit: Payer: Self-pay

## 2021-07-14 ENCOUNTER — Other Ambulatory Visit (HOSPITAL_COMMUNITY): Payer: Self-pay

## 2021-07-14 ENCOUNTER — Ambulatory Visit (INDEPENDENT_AMBULATORY_CARE_PROVIDER_SITE_OTHER): Payer: Medicaid Other | Admitting: Infectious Diseases

## 2021-07-14 VITALS — BP 150/76 | HR 70 | Temp 98.5°F | Ht <= 58 in | Wt 180.0 lb

## 2021-07-14 DIAGNOSIS — Z23 Encounter for immunization: Secondary | ICD-10-CM | POA: Diagnosis not present

## 2021-07-14 DIAGNOSIS — K7469 Other cirrhosis of liver: Secondary | ICD-10-CM | POA: Diagnosis not present

## 2021-07-14 DIAGNOSIS — B182 Chronic viral hepatitis C: Secondary | ICD-10-CM

## 2021-07-14 NOTE — Patient Instructions (Signed)
We gave you your last hepatitis B vaccine today and your first pneumonia vaccine today.   I would like to get your hepatitis A vaccine at your next visit in October - this is 2 doses spaced 6 months apart.   Please continue your Epclusa everyday as you have been doing.   Would like to see you back in October - will check lab work at that visit.

## 2021-07-14 NOTE — Assessment & Plan Note (Addendum)
1a, decompensated cirrhotic patient on Epclusa now 3 months into treatment - encouraged that her labs looked good last office visit with undetectable viral load at last visit indicating expected response to treatment. MELD score 9.  She had repeat LFT/CBC recently earlier this month and stable with overall stable clinical condition. She has had 2 doses delayed d/t shipment issues which we resolved today. I do not think this will significantly impact her ability to achieve cure here.   PPSV23 today --> hopeful we can offer the PCV20 at future visit. Otherwise maybe Dr. Margarita Rana can help offer this if available at her PCP office to fully vaccinate against pneumococcus.   Hep B #2 given today to complete the series.  Will give Hep A#2 at upcoming OV.

## 2021-07-14 NOTE — Assessment & Plan Note (Addendum)
Seen by GI team - MELD 9 from labs in June. Started on aldactone. Small esophageal varices and portal htn gastropathy on EGD 06/11/21.  Abnormal liver imaging with findings c/w cirrhosis changes    - CT 4/22: 2.1 cm density in the right hepatic lobe    - MRI 02/27/21 - likely perfusion anomaly, repeat in 6 months recommended AFP 12.0 (02/2021).   Consideration for referral for transplant should she continue to advance disease process. Abstain from alcohol, low sodium diet reinforced as [name] previously outlined. Would like to see her work on cigarette cessation as well.

## 2021-07-14 NOTE — Progress Notes (Signed)
Patient Name: Samantha Evans  Date of Birth: 19-Apr-1961  MRN: [id number]  PCP: [name], MD  Referring Provider: [name], MD, Ph#: [phone]   CC:  FU hepatitis C infection. On Epclusa x 6 months.     HPI: Samantha Evans is a 60 y.o. female here for follow up.   Start Date: 04/11/21   Hepatitis C Genotype: 1a   Fibrosis Score: F4 decompensatd   Hepatitis C RNA: 954,000 on 03/02/21  Leg swelling is much better since starting on aldactone once a day. No new concerns to report today.   Takes her epclusa everyday at 7 pm. Fatigue side effect has improved with ongoing use.  Started on magnesium once daily - feels that this has helped her sleep better and improved her energy and reduced headaches.  Has any trouble or changes with her bowel movements, urine, skin. New umbilical/ventral hernia noted - was causing some pain but this has since eased off. No pain today.     ROS:  Constitutional: positive for fevers, chills, malaise, anorexia and weight loss Eyes: negative for icterus Cardiovascular: negative for chest pain, dyspnea, orthopnea, lower extremity edema Gastrointestinal: positive for reflux symptoms and abdominal pain, negative for nausea, vomiting, melena, diarrhea, constipation and jaundice Hematologic/lymphatic: negative for bleeding, lymphadenopathy and petechiae Musculoskeletal: negative for myalgias and arthralgias Neurological: negative for headaches, paresthesia, coordination problems and gait problems Behavioral/Psych: negative for excessive alcohol consumption and illegal drug usage All other systems reviewed and are negative        Past Medical History:  Diagnosis Date   Arthritis    Carpal tunnel syndrome    Cataracts, bilateral    Cirrhosis ([place])    Hypertension     Prior to Admission medications   Medication Sig Start Date End Date Taking? Authorizing Provider  docusate sodium (COLACE) 100 MG capsule Take 1 capsule (100 mg total)  by mouth 2 (two) times daily. 03/03/21   Kathie Dike, MD  pantoprazole (PROTONIX) 40 MG tablet Take 1 tablet (40 mg total) by mouth 2 (two) times daily. 03/03/21   Kathie Dike, MD  polyethylene glycol (MIRALAX / GLYCOLAX) 17 g packet Take 17 g by mouth daily as needed for mild constipation. 03/03/21   Kathie Dike, MD  traMADol (ULTRAM) 50 MG tablet Take 1 tablet (50 mg total) by mouth every 6 (six) hours as needed for severe pain. 03/03/21   Meuth, Brooke A, PA-C    No Known Allergies  Social History   Tobacco Use   Smoking status: Every Day    Packs/day: 0.50    Years: 40.00    Pack years: 20.00    Types: Cigarettes    Last attempt to quit: 02/27/2021    Years since quitting: 0.3   Smokeless tobacco: Never  Vaping Use   Vaping Use: Never used  Substance Use Topics   Alcohol use: Not Currently   Drug use: Not Currently    Family History  Problem Relation Age of Onset   Lung cancer Father    Liver disease Neg Hx    Colon cancer Neg Hx    Esophageal cancer Neg Hx    Pancreatic cancer Neg Hx    Stomach cancer Neg Hx     Objective:   Vitals:   07/14/21 1352  BP: (!) 150/76  Pulse: 70  Temp: 98.5 F (36.9 C)  SpO2: 96%     Constitutional: in no apparent distress, well developed and well nourished and oriented times 3  Eyes: anicteric Cardiovascular: Cor RRR and No murmurs Respiratory: clear Gastrointestinal: Bowel sounds are normal, liver is not enlarged, spleen is not enlarged but body habitus limiting exam. Well healed surgical scar over umbilicus. Seems more like diastasis recti.  Musculoskeletal: pedal edema trace bilaterally, intact peripheral pulses Skin: negative for - jaundice, spider hemangioma, telangiectasia, palmar erythema, ecchymosis and atrophy; no porphyria cutanea tarda Lymphatic: no cervical lymphadenopathy   Laboratory: Genotype:  Lab Results  Component Value Date   HCVGENOTYPE 1a 03/25/2021   HCV viral load:  Lab Results   Component Value Date   HCVQUANT 954,000 03/02/2021   Lab Results  Component Value Date   WBC 3.8 05/12/2021   HGB 10.3 (L) 05/12/2021   HCT 28.9 (L) 05/12/2021   MCV 88.1 05/12/2021   PLT 84 (L) 05/12/2021    Lab Results  Component Value Date   CREATININE 0.70 06/23/2021   BUN 13 06/23/2021   NA 137 06/23/2021   K 4.0 06/23/2021   CL 108 06/23/2021   CO2 23 06/23/2021    Lab Results  Component Value Date   ALT 26 06/23/2021   AST 47 (H) 06/23/2021   ALKPHOS 130 (H) 06/23/2021    Lab Results  Component Value Date   INR 1.2 (H) 05/12/2021   BILITOT 1.4 (H) 06/23/2021   ALBUMIN 3.4 (L) 06/23/2021     Imaging:  MRI of liver 03/01/2021 - cirrhosis+ with portal hypertension, enlarged spleen (14 cm) with trace ascites. Recommended FU in 37m    Assessment & Plan:   Problem List Items Addressed This Visit       Unprioritized   Hepatic cirrhosis ([place]- by CT scan    Seen by GI team - MELD 9 from labs in June. Started on aldactone. Small esophageal varices and portal htn gastropathy on EGD 06/11/21.  Abnormal liver imaging with findings c/w cirrhosis changes    - CT 4/22: 2.1 cm density in the right hepatic lobe    - MRI 02/27/21 - likely perfusion anomaly, repeat in 6 months recommended AFP 12.0 (02/2021).   Consideration for referral for transplant should she continue to advance disease process. Abstain from alcohol, low sodium diet reinforced as Dr. BTarri Glennpreviously outlined. Would like to see her work on cigarette cessation as well.         Chronic hepatitis C without hepatic coma (HCC)    1a, decompensated cirrhotic patient on Epclusa now 3 months into treatment - encouraged that her labs looked good last office visit with undetectable viral load at last visit indicating expected response to treatment. MELD score 9.  She had repeat LFT/CBC recently earlier this month and stable with overall stable clinical condition. She has had 2 doses delayed d/t shipment issues  which we resolved today. I do not think this will significantly impact her ability to achieve cure here.   PPSV23 today --> hopeful we can offer the PCV20 at future visit. Otherwise maybe Dr. NMargarita Ranacan help offer this if available at her PCP office to fully vaccinate against pneumococcus.   Hep B #2 given today to complete the series.  Will give Hep A#2 at upcoming OV.          SJanene Madeira MSN, NP-C RCoast [facility] Hospitalfor Infectious Disease [email or web address] Pager: [phone]: [phone]. John [phone]

## 2021-07-16 ENCOUNTER — Ambulatory Visit: Payer: Medicaid Other | Admitting: Surgery

## 2021-07-17 ENCOUNTER — Other Ambulatory Visit: Payer: Self-pay | Admitting: Gastroenterology

## 2021-07-17 ENCOUNTER — Telehealth: Payer: Self-pay | Admitting: Gastroenterology

## 2021-07-17 DIAGNOSIS — Z419 Encounter for procedure for purposes other than remedying health state, unspecified: Secondary | ICD-10-CM | POA: Diagnosis not present

## 2021-07-17 DIAGNOSIS — Z86010 Personal history of colonic polyps: Secondary | ICD-10-CM

## 2021-07-17 NOTE — Telephone Encounter (Signed)
At [name] request and given NS for today's procedure, called pt to inquire further about transportation concern. Provided pt with contact information for [facility], as well as information about their services. Pt proceeded to state that she gets "FREE" transportation through [facility] and would be utilizing their services rather than [place]. Pt insisted she does have a caregiver arranged who will be coming to stay at her home tonight around 6pm. This same caregiver will be riding with her utilizing the Medicaid "FREE" transportation services, caregiver will be staying throughout the procedure, then caregiver will be riding home with her afterwards. Pt first expressed uncertainty if caregiver will remain with the evening following her procedure, then immediately confirmed caregiver will be staying at her home the evening following her procedure. Since she has secured transportation and has a caregiver arranged, [name] agreed to reschedule procedure (refer to appts).  While on the phone with pt, informed her that I noticed she had reviewed her flex sig prep instructions via My Chart and wondered if she had completed her prep as instructed for today's procedure. Pt denied having completed her prep as instructed, although has been "taking packets of Miralax" and felt that would be sufficient. Education provided re: the importance of the prep as it applies to success of this particular procedure. Pt insisted she was not aware, adding that she is visually challenged. Asked if her caregiver would be able to assist her in reading the prep instructions, gathering her prep supplies and assisting her to ensure she completes the prep as written. Pt confirmed caregiver is not visually challenged and could gather the supplies and could help her complete her prep correctly. Given caregiver projected arrival time at pt home, asked pt if she thought it would be reasonable for her caregiver to gather her  supplies in a timely manner so she could complete her prep. Pt insisted caregiver would be able to do so and would be able to help her ensure that her prep was done correctly. Given her reassurance and at pt request, pt has been rescheduled for procedure (refer to appts). Strongly encouraged pt to call our office if she cannot secure transportation as she has claimed, if she has any concerns about securing her caregiver as she has claimed or if she is unable to complete her prep as she has claimed. Verbalized complete acceptance and understanding of my instructions.   New ambulatory referral placed for rescheduled procedure. Notified [name] of my conversation with pt. Also notified pre-cert and Osvaldo Angst, CRNA about added procedure for tomorrow.

## 2021-07-17 NOTE — Addendum Note (Signed)
Addended by: Aleatha Borer J on: 07/17/2021 04:33 PM   Modules accepted: Orders

## 2021-07-17 NOTE — Telephone Encounter (Signed)
Patient did not keep her appointment for flexible sigmoidoscopy today due to transportation issues. I called her, reviewing her primary pathology results, and reiterating my concern that this polyp need to be completely removed as she would be at high risk for colon cancer. She agreed to reschedule as soon as she is able to find a ride. Transportation option information provided to the patient.

## 2021-07-18 ENCOUNTER — Telehealth: Payer: Self-pay | Admitting: *Deleted

## 2021-07-18 ENCOUNTER — Encounter: Payer: Self-pay | Admitting: Gastroenterology

## 2021-07-18 ENCOUNTER — Ambulatory Visit (AMBULATORY_SURGERY_CENTER): Payer: Medicaid Other | Admitting: Gastroenterology

## 2021-07-18 VITALS — BP 124/50 | HR 72 | Temp 99.1°F | Resp 16 | Ht 60.0 in | Wt 185.0 lb

## 2021-07-18 DIAGNOSIS — D128 Benign neoplasm of rectum: Secondary | ICD-10-CM

## 2021-07-18 DIAGNOSIS — D125 Benign neoplasm of sigmoid colon: Secondary | ICD-10-CM | POA: Diagnosis not present

## 2021-07-18 DIAGNOSIS — K621 Rectal polyp: Secondary | ICD-10-CM | POA: Diagnosis not present

## 2021-07-18 DIAGNOSIS — Z86010 Personal history of colonic polyps: Secondary | ICD-10-CM | POA: Diagnosis not present

## 2021-07-18 DIAGNOSIS — K6389 Other specified diseases of intestine: Secondary | ICD-10-CM | POA: Diagnosis not present

## 2021-07-18 MED ORDER — FLEET ENEMA 7-19 GM/118ML RE ENEM
1.0000 | ENEMA | Freq: Once | RECTAL | Status: AC
  Administered 2021-07-18: 1 via RECTAL

## 2021-07-18 MED ORDER — SODIUM CHLORIDE 0.9 % IV SOLN: 500.0000 mL | Freq: Once | INTRAVENOUS | Status: DC

## 2021-07-18 NOTE — Telephone Encounter (Signed)
Thank you for your help.

## 2021-07-18 NOTE — Progress Notes (Signed)
Called to room to assist during endoscopic procedure.  Patient ID and intended procedure confirmed with present staff. Received instructions for my participation in the procedure from the performing physician.  

## 2021-07-18 NOTE — Patient Instructions (Signed)

## 2021-07-18 NOTE — Progress Notes (Signed)
Pt's states no medical or surgical changes since previsit or office visit. VS assessed by N.C ?

## 2021-07-18 NOTE — Progress Notes (Signed)
Prep not completed. Last BM still solid per patient. MD notified and ordered enema. Patient still having solid stool post enema. Repeat enema ordered. Will re-evaluate.

## 2021-07-18 NOTE — Progress Notes (Signed)
A and O x3. Report to RN. Tolerated MAC anesthesia well. 

## 2021-07-18 NOTE — Telephone Encounter (Signed)
[phone] 

## 2021-07-18 NOTE — Telephone Encounter (Signed)
Call patient with appointment date and time. Patient given Clenpiq sample and blank instructions. Will call patient on Tuesday 07/22/21

## 2021-07-18 NOTE — Op Note (Signed)
Samantha Evans Patient Name: Nino Kirschner Procedure Date: 07/18/2021 2:43 PM MRN: [id number] Endoscopist: [name] MD, MD Age: 60 Referring MD:  Date of Birth: Oct 13, 1961 Gender: Female Account #: [id number] Procedure:                Flexible Sigmoidoscopy Indications:              Personal history of colonic polyps                           Screening colonoscopy 06/11/21 revealed a 73m                            sigmoid polyp removed with a hot snare. Pathology                            revealed a tubular adenoma with high grade                            dysplasia with area of pseudoinvasion. Repeat exam                            planned to reassess the polypectomy site. Patient                            presented for the exam today without a prep. She                            was given two enemas in the endoscopy unit prior to                            the procedure to faciliate the exam. Medicines:                Monitored Anesthesia Care Procedure:                Pre-Anesthesia Assessment:                           - Prior to the procedure, a History and Physical                            was performed, and patient medications and                            allergies were reviewed. The patient's tolerance of                            previous anesthesia was also reviewed. The risks                            and benefits of the procedure and the sedation                            options and risks were discussed with the patient.  All questions were answered, and informed consent                            was obtained. Prior Anticoagulants: The patient has                            taken no previous anticoagulant or antiplatelet                            agents. ASA Grade Assessment: III - A patient with                            severe systemic disease. After reviewing the risks                            and benefits, the  patient was deemed in                            satisfactory condition to undergo the procedure.                           After obtaining informed consent, the scope was                            passed under direct vision. The CF [id number]                            was introduced through the anus and advanced to the                            50 cm from the anal verge. The flexible                            sigmoidoscopy was unusually difficult due to poor                            bowel prep. The patient tolerated the procedure                            well. The quality of the bowel preparation was poor. Scope In: 4:01:21 PM Scope Out: 4:14:37 PM Total Procedure Duration: 0 hours 13 minutes 16 seconds  Findings:                 A 2 mm polyp was found in the rectum. The polyp was                            sessile. The polyp was removed with a cold snare.                            Resection and retrieval were complete. Estimated                            blood loss  was minimal.                           A 4 mm polyp was found in the distal sigmoid colon.                            This area is more distal than I remember the polyp                            being on her last colonoscopy. The polyp was                            sessile. The polyp was removed with a cold snare.                            Resection and retrieval were complete. Area was                            tattooed with an injection of 2 mL of Spot (carbon                            black).                           Liquid semi-liquid semi-solid solid stool was found                            in the entire colon, making visualization                            difficult. This limits any meaningful evaluation                            above the most distal sigmoid colon. Complications:            No immediate complications. Estimated Blood Loss:     Estimated blood loss was minimal. Impression:                - Preparation of the colon was poor.                           - One 2 mm polyp in the rectum, removed with a cold                            snare. Resected and retrieved.                           - One 4 mm polyp in the distal sigmoid colon,                            removed with a cold snare. Resected and retrieved.                            Tattooed. Unfortunately, I do not  think this is the                            polyp of concern.                           - Stool in the entire examined colon. Recommendation:           - Discharge patient to home.                           - Resume previous diet.                           - Continue present medications.                           - Await pathology results.                           - Repeat flexible sigmoidoscopy at the next                            available appointment because the bowel preparation                            was poor. Plan a full prep for colonoscopy to allow                            complete evaluation of the colonic mucosa. [name] MD, MD 07/18/2021 4:28:23 PM This report has been signed electronically.

## 2021-07-18 NOTE — Progress Notes (Signed)
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  HPI: Samantha Evans is a 60 y.o. female returns for colonoscopy.  Colonoscopy 06/11/21 - Hemorrhoids were found on perianal exam. - Non-bleeding internal hemorrhoids were found. - Multiple small and large-mouthed diverticula were found in the sigmoid colon and descending colon. - A 15 mm polyp was found in the sigmoid colon. The polyp was pedunculated and originally folded over such that the margins of the polyp were unclear. I biopsied the polyp to try to reposition it. At that point, it was clearly a pedunculated adenoma. The polyp was removed with a hot snare. Resection and retrieval were complete. Estimated blood loss was minimal. - A 3 mm polyp was found in the sigmoid colon. The polyp was sessile. The polyp was removed with a cold snare. Resection and retrieval were complete. Estimated blood loss was minimal. - A medium sized AVM is present in the proximal ascending colon. The exam was otherwise without abnormality on direct and retroflexion views.   1. Surgical [P], colon, sigmoid polyp x1, polyp (1) - TUBULAR ADENOMA. 2. Surgical [P], colon, sigmoid polyp x1 - OUTSIDE CONSULTATION DIAGNOSIS: TUBULAR ADENOMA WITH HIGH GRADE DYSPLASIA AND AREAS OF PSEUDOINVASION, SEE COMMENT.  Repeat colonoscopy recommended to insure the adenoma with high grade dysplasia was completely removed and the polypectomy site marked.   Past Medical History:  Diagnosis Date   Arthritis    Carpal  tunnel syndrome    Cataracts, bilateral    Cirrhosis ([place])    Hypertension     Past Surgical History:  Procedure Laterality Date   ERCP     UMBILICAL HERNIA REPAIR N/A 02/27/2021   Procedure: REPAIR INCARCERATED UMBILICAL HERNIA;  Surgeon: Georganna Skeans, MD;  Location: [place];  Service: General;  Laterality: N/A;    Current Outpatient Medications  Medication Sig Dispense Refill   amLODipine (NORVASC) 10 MG tablet Take 1 tablet (10 mg total) by mouth daily. 30 tablet 6   psyllium (METAMUCIL SMOOTH TEXTURE) 58.6 % powder Take 1 packet by mouth daily. 283 g 12   Sofosbuvir-Velpatasvir (EPCLUSA) 400-100 MG TABS Take 1 tablet by mouth daily. 28 tablet 5   spironolactone (ALDACTONE) 50 MG tablet Take 1 tablet (50 mg total) by mouth daily. 30 tablet 5   No current facility-administered medications for this visit.    Allergies as of 07/18/2021   (No Known Allergies)       Physical Exam: General:   Alert, in NAD. No scleral icterus. No bilateral temporal wasting.  Heart:  Regular rate and rhythm; no murmurs Pulm: Clear anteriorly; no wheezing Abdomen:  Soft. Nontender. Nondistended. Normal bowel sounds. No rebound or guarding. No fluid wave.  LAD: No inguinal or umbilical LAD Extremities:  1-2+ LE edema. Neurologic:  Alert and  oriented x4;  grossly normal neurologically; no asterixis or clonus. Skin: No jaundice. Spider angioma on the chest wall. No palmar erythema.  Psych:  Alert and cooperative. Normal mood and affect.    Jehan Bonano L. Tarri Glenn, MD, MPH  07/18/2021, 2:42 PM

## 2021-07-22 ENCOUNTER — Other Ambulatory Visit: Payer: Self-pay

## 2021-07-22 ENCOUNTER — Telehealth: Payer: Self-pay

## 2021-07-22 DIAGNOSIS — D128 Benign neoplasm of rectum: Secondary | ICD-10-CM

## 2021-07-22 DIAGNOSIS — D129 Benign neoplasm of anus and anal canal: Secondary | ICD-10-CM

## 2021-07-22 DIAGNOSIS — Z86010 Personal history of colonic polyps: Secondary | ICD-10-CM

## 2021-07-22 NOTE — Telephone Encounter (Signed)
Following was sent to pt this morning with no response:  You  Vences, Chabeli Barsamian 4 hours ago (9:50 AM)   Hi Samantha Evans,   [name] is in clinic all day today and would not be able to complete your repeat procedure. She has advised that she would like for you to be added on at [place] (07/23/21). I am needing to know the following:   Are you able to arrange transportation WITH a caregiver for tomorrow? Were you given a prep kit to complete your prep beginning TODAY?  This MyChart message has not been read.   Given cancellations for 07/23/21 and after speaking with [name], pt can now be added to 07/23/21 [facility] schedule at 11:30am. Called pt to inquire if she could make transportation arrangements and have a caregiver arranged for appt tomorrow at 1130am. Pt confirmed. Also asked if she was given a sample prep kit. Confirmed stating [employer]. Asked is she was given instructions on how to use prep kit. Pt denied. Provided detailed prep instructions verbally and asked that she read them back to ensure comprehension of instructions. Read back completed with success. Pt verbalized understanding of her prep instructions, arrival time and NPO time. New referral placed for appt 07/23/21. Also canceled previsit for 9/19 and procedure for 10/6 per [name] request.

## 2021-07-22 NOTE — Telephone Encounter (Signed)
[facility] [place] patient.  Patient had already been re-scheduled and said she understands instructions that were guven.

## 2021-07-22 NOTE — Patient Instructions (Signed)
Visit Information  Ms. Dress was given information about Medicaid Managed Care team care coordination services as a part of their [facility] Medicaid benefit. Markeita Alicia Winters verbally consented to engagement with the [facility] Managed Care team.   If you are experiencing a medical emergency, please call 911 or report to your local emergency department or urgent care.   If you have a non-emergency medical problem during routine business hours, please contact your provider's office and ask to speak with a nurse.   For questions related to your [facility] health plan, please call: [phone] or go here:[email or web address]  If you would like to schedule transportation through your [facility] plan, please call the following number at least 2 days in advance of your appointment: [phone].  Call the [place] at [phone], at any time, 24 hours a day, 7 days a week. If you are in danger or need immediate medical attention call 911.  If you would like help to quit smoking, call 1-800-QUIT-NOW 325-838-6289) OR Espaol: 1-855-Djelo-Ya (5-329-924-2683) o para ms informacin haga clic aqu or Text READY to 200-400 to register via text  Ms. Minier - following are the goals we discussed in your visit today:   Goals Addressed             This Visit's Progress    RNCM - Hepatitis Monitored & Managed       Timeframe:  Long-Range Goal Priority:  High Start Date:  07/22/2021                           Expected End Date:   11/19/2021       Patient Goals:   Patient will self administer medications as prescribed Patient will attend all scheduled provider appointments Patient will call pharmacy for medication refills Patient will continue to perform ADL's independently Patient will continue to perform IADL's independently Patient will call provider office for new concerns or questions               RNCM - Hypertension Monitored & Managed        Timeframe:  Long-Range Goal Priority:  High Start Date:  07/22/2021                           Expected End Date:  11/19/2021     Patient Goals: Patient will self administer medications as prescribed Patient will attend all scheduled provider appointments Patient will call pharmacy for medication refills Patient will continue to perform ADL's independently Patient will continue to perform IADL's independently Patient will call provider office for new concerns or questions                 RNCM - Reoccurring Hernia Issues Monitored & Managed       Timeframe:  Long-Range Goal Priority:  High Start Date: 07/22/2021                           Expected End Date: 11/19/2021     Patient Goals:   *    Patient will self administer medications as prescribed Patient will attend all scheduled provider appointments Patient will call pharmacy for medication refills Patient will continue to perform ADL's independently Patient will continue to perform IADL's independently Patient will call provider office for new concerns or questions  Patient will review today's visit via My Chart.  The Managed Medicaid care management team will reach out to the patient again over the next 30 days.   Salvatore Marvel RN, BSN Community Care Coordinator [facility] Mobile: [phone]   Following is a copy of your plan of care:  Patient Care Plan: RNCM Plan of Care     Problem Identified: Care Management and Care Coordination Needs for HTN, Hepatitis C and reoccurring hernia issues      Long-Range Goal: RNCM Plan of Care for Care Management and Care Coordination Needs   Start Date: 07/22/2021  Expected End Date: 11/19/2021  Priority: High  Note:   Current Barriers:  Knowledge Deficits related to plan of care for management of HTN and Hepatitis C and reoccurring hernia issues  Care Coordination needs related to Transportation  Chronic Disease Management support  and education needs related to HTN and Hepatitis C and reoccurring hernia issues Transportation barriers No Advanced Directives in place  RNCM Clinical Goal(s):  Patient will verbalize understanding of plan for management of HTN and Hepatitis C and reoccurring hernia issues verbalize basic understanding of HTN and Hepatitis C and reoccurring hernia issues disease process and self health management plan   take all medications exactly as prescribed and will call provider for medication related questions attend all scheduled medical appointments: 07/25/2021 for new patient visit with Dr Hampton Abbot for hernia evaluation demonstrate ongoing adherence to prescribed treatment plan for HTN and Hepatitis C and reoccurring hernia issues as evidenced by adherence to prescribed medication regimen contacting provider for new or worsened symptoms or questions and adhering to low sodium diet   continue to work with RN Care Manager to address care management and care coordination needs related to HTN and Hepatitis C and reoccurring hernia issues  through collaboration with RN Care manager, provider, and care team.   Interventions: Inter-disciplinary care team collaboration (see longitudinal plan of care) Evaluation of current treatment plan related to  self management and patient's adherence to plan as established by provider   Hepatitis C and Reoccurring Hernia Issues  (Status: New goal.) Evaluation of current treatment plan related to  Hepatitis C and reoccurring hernia issues ,  Transportation needs addressed and self-management and patient's adherence to plan as established by provider. Discussed plans with patient for ongoing care management follow up and provided patient with direct contact information for care management team Advised patient to report any changes in hernia to PCP; Provided education to patient re: pain management for any hernia pain; Reviewed medications with patient and discussed  importance of adhering to medication regimen; Reviewed scheduled/upcoming provider appointments including 07/25/2021 with Dr Hampton Abbot for new hernia evaluation;  Hypertension: (Status: New goal.) Last practice recorded BP readings:  BP Readings from Last 3 Encounters:  07/18/21 (!) 124/50  07/14/21 (!) 150/76  07/03/21 (!) 146/72  Most recent eGFR/CrCl: No results found for: EGFR  No components found for: CRCL  Evaluation of current treatment plan related to hypertension self management and patient's adherence to plan as established by provider;   Reviewed prescribed diet : low sodium diet.  Patient adhering to not adding any salt to food or cooking. Reviewed medications with patient and discussed importance of compliance;  Provided assistance with obtaining home blood pressure monitor via free medicaid plan but patient refused and feels she does not need one st this time;  Discussed plans with patient for ongoing care management follow up and provided patient with direct contact  information for care management team; Reviewed scheduled/upcoming provider appointments including:  Provided education on prescribed diet low sodium diet;  Assessed social determinant of health barriers;   Patient Goals/Self-Care Activities: Patient will self administer medications as prescribed Patient will attend all scheduled provider appointments Patient will call pharmacy for medication refills Patient will continue to perform ADL's independently Patient will continue to perform IADL's independently Patient will call provider office for new concerns or questions

## 2021-07-22 NOTE — Telephone Encounter (Signed)
  Follow up Call-  Call back number 07/18/2021 06/11/2021 05/20/2021  Post procedure Call Back phone  # [phone]  Permission to leave phone message Yes Yes Yes  Some recent data might be hidden     Patient questions:  Do you have a fever, pain , or abdominal swelling? No. Pain Score  0 *  Have you tolerated food without any problems? Yes.    Have you been able to return to your normal activities? Yes.    Do you have any questions about your discharge instructions: Diet   No. Medications  No. Follow up visit  No.  Do you have questions or concerns about your Care? No.  Actions: * If pain score is 4 or above: No action needed, pain <4.

## 2021-07-22 NOTE — Patient Outreach (Signed)
Medicaid Managed Care   Nurse Care Manager Note  07/22/2021 Name:  Samantha Evans MRN:  [id number] DOB:  1961-07-01  Samantha Evans is an 60 y.o. year old female who is a primary patient of [name], MD.  The [facility] Managed Care Coordination team was consulted for assistance with:    HTN Hepatitis C and Reoccurring Hernia Issues  Samantha Evans was given information about Medicaid Managed Care Coordination team services today. [place] Patient agreed to services and verbal consent obtained.  Engaged with patient by telephone for initial visit in response to provider referral for case management and/or care coordination services.   Assessments/Interventions:  Review of past medical history, allergies, medications, health status, including review of consultants reports, laboratory and other test data, was performed as part of comprehensive evaluation and provision of chronic care management services.  SDOH (Social Determinants of Health) assessments and interventions performed: SDOH Interventions    Flowsheet Row Most Recent Value  SDOH Interventions   SDOH Interventions for the Following Domains Tobacco, Transportation  Food Insecurity Interventions Intervention Not Indicated  Financial Strain Interventions Intervention Not Indicated  Housing Interventions Intervention Not Indicated  Physical Activity Interventions Intervention Not Indicated  Stress Interventions Intervention Not Indicated  Social Connections Interventions Intervention Not Indicated  Tobacco Interventions Other (Comment)  [Encouragement to decrease smoking]  Transportation Interventions Other (Comment)  [Recieved approval this week from Social Services for free transportation to medical appointments]       Care Plan  No Known Allergies  Medications Reviewed Today     Reviewed by Inge Rise, RN (Case Manager) on 07/22/21 at 1120  Med List Status: <None>   Medication Order Taking? Sig  Documenting Provider Last Dose Status Informant  0.9 %  sodium chloride infusion [id number]   [name], MD  Active   amLODipine (NORVASC) 10 MG tablet [id number] Yes Take 1 tablet (10 mg total) by mouth daily. [name], MD Taking Active   psyllium (METAMUCIL SMOOTH TEXTURE) 58.6 % powder [id number] Yes Take 1 packet by mouth daily. [name], MD Taking Active   Sofosbuvir-Velpatasvir [facility]) 400-100 MG TABS [id number] Yes Take 1 tablet by mouth daily. Kuppelweiser, [name], RPH-CPP Taking Active   spironolactone (ALDACTONE) 50 MG tablet [id number] Yes Take 1 tablet (50 mg total) by mouth daily. [name], MD Taking Active             Patient Active Problem List   Diagnosis Date Noted   Peripheral edema 05/12/2021   Chronic hepatitis C without hepatic coma ([place]) 03/25/2021   Acid reflux 03/25/2021   Incarcerated umbilical hernia 82/99/3716   Small bowel obstruction ([place]) 02/27/2021   Hepatic cirrhosis ([place])- by CT scan 02/27/2021   AKI (acute kidney injury) ([place]) 02/27/2021    Conditions to be addressed/monitored per PCP order:  HTN and Hepatitis C and Reoccurring Hernia Issues  Care Plan : RNCM Plan of Care  Updates made by Inge Rise, RN since 07/22/2021 12:00 AM     Problem: Care Management and Care Coordination Needs for HTN, Hepatitis C and reoccurring hernia issues      Long-Range Goal: RNCM Plan of Care for Care Management and Care Coordination Needs   Start Date: 07/22/2021  Expected End Date: 11/19/2021  Priority: High  Note:   Current Barriers:  Knowledge Deficits related to plan of care for management of HTN and Hepatitis C and reoccurring hernia issues  Care Coordination needs related to Transportation  Chronic Disease Management support  and education needs related to HTN and Hepatitis C and reoccurring hernia issues Transportation barriers No Advanced Directives in place  RNCM Clinical Goal(s):  Patient will verbalize  understanding of plan for management of HTN and Hepatitis C and reoccurring hernia issues verbalize basic understanding of HTN and Hepatitis C and reoccurring hernia issues disease process and self health management plan   take all medications exactly as prescribed and will call provider for medication related questions attend all scheduled medical appointments: 07/25/2021 for new patient visit with Dr Hampton Abbot for hernia evaluation demonstrate ongoing adherence to prescribed treatment plan for HTN and Hepatitis C and reoccurring hernia issues as evidenced by adherence to prescribed medication regimen contacting provider for new or worsened symptoms or questions and adhering to low sodium diet   continue to work with RN Care Manager to address care management and care coordination needs related to HTN and Hepatitis C and reoccurring hernia issues  through collaboration with RN Care manager, provider, and care team.   Interventions: Inter-disciplinary care team collaboration (see longitudinal plan of care) Evaluation of current treatment plan related to  self management and patient's adherence to plan as established by provider   Hepatitis C and Reoccurring Hernia Issues  (Status: New goal.) Evaluation of current treatment plan related to  Hepatitis C and reoccurring hernia issues ,  Transportation needs addressed and self-management and patient's adherence to plan as established by provider. Discussed plans with patient for ongoing care management follow up and provided patient with direct contact information for care management team Advised patient to report any changes in hernia to PCP; Provided education to patient re: pain management for any hernia pain; Reviewed medications with patient and discussed importance of adhering to medication regimen; Reviewed scheduled/upcoming provider appointments including 07/25/2021 with Dr Hampton Abbot for new hernia evaluation;  Hypertension: (Status: New  goal.) Last practice recorded BP readings:  BP Readings from Last 3 Encounters:  07/18/21 (!) 124/50  07/14/21 (!) 150/76  07/03/21 (!) 146/72  Most recent eGFR/CrCl: No results found for: EGFR  No components found for: CRCL  Evaluation of current treatment plan related to hypertension self management and patient's adherence to plan as established by provider;   Reviewed prescribed diet : low sodium diet.  Patient adhering to not adding any salt to food or cooking. Reviewed medications with patient and discussed importance of compliance;  Provided assistance with obtaining home blood pressure monitor via free medicaid plan but patient refused and feels she does not need one st this time;  Discussed plans with patient for ongoing care management follow up and provided patient with direct contact information for care management team; Reviewed scheduled/upcoming provider appointments including:  Provided education on prescribed diet low sodium diet;  Assessed social determinant of health barriers;   Patient Goals/Self-Care Activities: Patient will self administer medications as prescribed Patient will attend all scheduled provider appointments Patient will call pharmacy for medication refills Patient will continue to perform ADL's independently Patient will continue to perform IADL's independently Patient will call provider office for new concerns or questions       Follow Up:  Patient agrees to Care Plan and Follow-up.  Plan: The Managed Medicaid care management team will reach out to the patient again over the next 30 days.  Date/time of next scheduled RN care management/care coordination outreach:  08/19/2021  Salvatore Marvel RN, BSN [facility] Coordinator [facility] Mobile: [phone]

## 2021-07-22 NOTE — Telephone Encounter (Signed)
12:30 Attempted patient to set up appointment and go over instructions.  Voice mailbox full and unable to leave a message. Will continue to attempt contact.

## 2021-07-23 ENCOUNTER — Ambulatory Visit (AMBULATORY_SURGERY_CENTER): Payer: Medicaid Other | Admitting: Gastroenterology

## 2021-07-23 ENCOUNTER — Encounter: Payer: Self-pay | Admitting: Gastroenterology

## 2021-07-23 ENCOUNTER — Other Ambulatory Visit: Payer: Self-pay

## 2021-07-23 VITALS — BP 132/61 | HR 72 | Temp 97.3°F | Resp 16 | Ht <= 58 in | Wt 180.0 lb

## 2021-07-23 DIAGNOSIS — Z86010 Personal history of colonic polyps: Secondary | ICD-10-CM

## 2021-07-23 DIAGNOSIS — K633 Ulcer of intestine: Secondary | ICD-10-CM | POA: Diagnosis not present

## 2021-07-23 DIAGNOSIS — D125 Benign neoplasm of sigmoid colon: Secondary | ICD-10-CM | POA: Diagnosis not present

## 2021-07-23 DIAGNOSIS — K5140 Inflammatory polyps of colon without complications: Secondary | ICD-10-CM | POA: Diagnosis not present

## 2021-07-23 MED ORDER — SODIUM CHLORIDE 0.9 % IV SOLN: 500.0000 mL | INTRAVENOUS | Status: DC

## 2021-07-23 NOTE — Progress Notes (Signed)
Referring Provider: [name], MD Primary Care Physician:  [name], MD  Recent for procedure:  Colon polyp with high grade dysplasia   IMPRESSION:  Two polyps removed on recent colonoscopy 06/11/21    - 1.1 cm tubular adenoma with high grade dysplasia in the sigmoid colon    - tubular adenoma   AVM of the colon on colonoscopy 06/11/21\  She is an appropriate candidate for monitored anesthesia care in the endoscopy unit today.     PLAN: -  Colonoscopy to insure complete polypectomy and tattoo the sigmoid polyp location    HPI: Samantha Evans is a 60 y.o. female returns for colonoscopy.  Colonoscopy 06/11/21 - Hemorrhoids were found on perianal exam. - Non-bleeding internal hemorrhoids were found. - Multiple small and large-mouthed diverticula were found in the sigmoid colon and descending colon. - A 15 mm polyp was found in the sigmoid colon. The polyp was pedunculated and originally folded over such that the margins of the polyp were unclear. I biopsied the polyp to try to reposition it. At that point, it was clearly a pedunculated adenoma. The polyp was removed with a hot snare. Resection and retrieval were complete. Estimated blood loss was minimal. - A 3 mm polyp was found in the sigmoid colon. The polyp was sessile. The polyp was removed with a cold snare. Resection and retrieval were complete. Estimated blood loss was minimal. - A medium sized AVM is present in the proximal ascending colon. The exam was otherwise without abnormality on direct and retroflexion views.   1. Surgical [P], colon, sigmoid polyp x1, polyp (1) - TUBULAR ADENOMA. 2. Surgical [P], colon, sigmoid polyp x1 - OUTSIDE CONSULTATION DIAGNOSIS: TUBULAR ADENOMA WITH HIGH GRADE DYSPLASIA AND AREAS OF PSEUDOINVASION, SEE COMMENT.  Repeat colonoscopy recommended to insure the adenoma with high grade dysplasia was completely removed and the polypectomy site marked. Attempted flex sig last  week but poor prep limited any meaningful evaluation. She returns now after a full bowel prep.   Past Medical History:  Diagnosis Date   Arthritis    Carpal tunnel syndrome    Cataracts, bilateral    Cirrhosis ([place])    Hypertension     Past Surgical History:  Procedure Laterality Date   ERCP     UMBILICAL HERNIA REPAIR N/A 02/27/2021   Procedure: REPAIR INCARCERATED UMBILICAL HERNIA;  Surgeon: Georganna Skeans, MD;  Location: [facility];  Service: General;  Laterality: N/A;    Current Outpatient Medications  Medication Sig Dispense Refill   amLODipine (NORVASC) 10 MG tablet Take 1 tablet (10 mg total) by mouth daily. 30 tablet 6   psyllium (METAMUCIL SMOOTH TEXTURE) 58.6 % powder Take 1 packet by mouth daily. 283 g 12   Sofosbuvir-Velpatasvir (EPCLUSA) 400-100 MG TABS Take 1 tablet by mouth daily. 28 tablet 5   spironolactone (ALDACTONE) 50 MG tablet Take 1 tablet (50 mg total) by mouth daily. 30 tablet 5   Current Facility-Administered Medications  Medication Dose Route Frequency Provider Last Rate Last Admin   0.9 %  sodium chloride infusion  500 mL Intravenous Once [name], MD       0.9 %  sodium chloride infusion  500 mL Intravenous Continuous [name], MD        Allergies as of 07/23/2021   (No Known Allergies)       Physical Exam: General:   Alert, in NAD. No scleral icterus. No bilateral temporal wasting.  Heart:  Regular rate and rhythm; no murmurs Pulm:  Clear anteriorly; no wheezing Abdomen:  Soft. Nontender. Nondistended. Normal bowel sounds. No rebound or guarding. No fluid wave.  LAD: No inguinal or umbilical LAD Extremities:  1-2+ LE edema. Neurologic:  Alert and  oriented x4;  grossly normal neurologically; no asterixis or clonus. Skin: No jaundice. Spider angioma on the chest wall. No palmar erythema.  Psych:  Alert and cooperative. Normal mood and affect.    Kashana Breach L. Tarri Glenn, MD, MPH 07/23/2021, 11:20 AM

## 2021-07-23 NOTE — Op Note (Signed)
[facility] Patient Name: Samantha Evans Procedure Date: 07/23/2021 11:40 AM MRN: [id number] Endoscopist: [name] MD, MD Age: 60 Referring MD:  Date of Birth: May 15, 1961 Gender: Female Account #: [id number] Procedure:                Flexible Sigmoidoscopy Indications:              Personal history of colonic polyps: 1.1 cm tubular                            adenoma with high grade dysplasia in the sigmoid                            colon removed on colonoscopy 05/2021, margins                            unclear. Colonoscopy recommended to reevaluate the                            polypectomy site. Medicines:                Monitored Anesthesia Care Procedure:                Pre-Anesthesia Assessment:                           - Prior to the procedure, a History and Physical                            was performed, and patient medications and                            allergies were reviewed. The patient's tolerance of                            previous anesthesia was also reviewed. The risks                            and benefits of the procedure and the sedation                            options and risks were discussed with the patient.                            All questions were answered, and informed consent                            was obtained. Prior Anticoagulants: The patient has                            taken no previous anticoagulant or antiplatelet                            agents. ASA Grade Assessment: II - A patient with  mild systemic disease. After reviewing the risks                            and benefits, the patient was deemed in                            satisfactory condition to undergo the procedure.                           After obtaining informed consent, the scope was                            passed under direct vision. The Olympus [id number]-[id number]                            [id number]) Colonoscope was  introduced through the                            anus and advanced to the the left transverse colon.                            The flexible sigmoidoscopy was accomplished without                            difficulty. The patient tolerated the procedure                            well. The quality of the bowel preparation was good. Scope In: 11:56:15 AM Scope Out: 12:19:14 PM Total Procedure Duration: 0 hours 22 minutes 59 seconds  Findings:                 Non-bleeding external and internal hemorrhoids were                            found.                           Multiple small and large-mouthed diverticula were                            found in the sigmoid colon and descending colon.                           A single (solitary) ulcer was found in the distal                            sigmoid colon, located 18cm from the anal verge.                            This is the expected location of the polypectomy                            site where the tubular adenoma with high grade  dysplasia was removed. No bleeding was present.                            Biopsies were taken from the ulcer margins with a                            cold forceps for histology. Area was tattooed with                            an injection of 2 mL of [place] ink.                           A localized area of mildly congested mucosa was                            found in the sigmoid colon. This was identified                            prior to the ulcer mentioned above. Out of concern                            that this was the polypectomy location, biopsies                            were taken with a cold forceps for histology.                            Estimated blood loss was minimal. Area was tattooed                            with an injection of 2 mL of [place] ink.                           Two possible flat/sessile polyps were found in the                             proximal sigmoid colon. The possible polyps were 1                            to 2 mm in size. These "polyps" were removed with a                            cold snare. Resection and retrieval were complete.                            Estimated blood loss was minimal. Area was tattooed                            with an injection of 2 mL of [place] ink prior to  locating the polypectomy site 18cm from the anal                            verge. Complications:            No immediate complications. Estimated blood loss:                            Minimal. Estimated Blood Loss:     Estimated blood loss was minimal. Impression:               - Non-bleeding external and internal hemorrhoids.                           - Diverticulosis in the sigmoid colon and in the                            descending colon.                           - A single (solitary) ulcer in the distal sigmoid                            colon at the location of the prior polypectomy with                            the adenoma with high grade dysplasia. Biopsied.                            Tattooed.                           - Congested mucosa in the sigmoid colon. This is of                            unclear clinical significance. Biopsied.                           - Two 1 to 2 mm possible polyps in the proximal                            sigmoid colon, removed with a cold snare. Resected                            and retrieved. Recommendation:           - Discharge patient to home.                           - Resume previous diet.                           - Continue present medications.                           - Await pathology results. [name] MD, MD 07/23/2021 12:36:16 PM This report has been signed electronically.

## 2021-07-23 NOTE — Patient Instructions (Signed)
Thank you for allowing [place] to care for you today!  Await biopsy results, approximately 2 weeks.  IF there is any abnormal findings we will refer you to a surgeon for further guidance. If tissue is normal , then Dr Tarri Glenn will recommend a future colonoscopy date.  Resume your regular medications and diet today.  Return to your normal daily activities tomorrow.    YOU HAD AN ENDOSCOPIC PROCEDURE TODAY AT [facility]:   Refer to the procedure report that was given to you for any specific questions about what was found during the examination.  If the procedure report does not answer your questions, please call your gastroenterologist to clarify.  If you requested that your care partner not be given the details of your procedure findings, then the procedure report has been included in a sealed envelope for you to review at your convenience later.  YOU SHOULD EXPECT: Some feelings of bloating in the abdomen. Passage of more gas than usual.  Walking can help get rid of the air that was put into your GI tract during the procedure and reduce the bloating. If you had a lower endoscopy (such as a colonoscopy or flexible sigmoidoscopy) you may notice spotting of blood in your stool or on the toilet paper. If you underwent a bowel prep for your procedure, you may not have a normal bowel movement for a few days.  Please Note:  You might notice some irritation and congestion in your nose or some drainage.  This is from the oxygen used during your procedure.  There is no need for concern and it should clear up in a day or so.  SYMPTOMS TO REPORT IMMEDIATELY:  Following lower endoscopy (colonoscopy or flexible sigmoidoscopy):  Excessive amounts of blood in the stool  Significant tenderness or worsening of abdominal pains  Swelling of the abdomen that is new, acute  Fever of 100F or higher   For urgent or emergent issues, a gastroenterologist can be reached at any hour by calling [phone]  [phone]. Do not use MyChart messaging for urgent concerns.    DIET:  We do recommend a small meal at first, but then you may proceed to your regular diet.  Drink plenty of fluids but you should avoid alcoholic beverages for 24 hours.  ACTIVITY:  You should plan to take it easy for the rest of today and you should NOT DRIVE or use heavy machinery until tomorrow (because of the sedation medicines used during the test).    FOLLOW UP: Our staff will call the number listed on your records 48-72 hours following your procedure to check on you and address any questions or concerns that you may have regarding the information given to you following your procedure. If we do not reach you, we will leave a message.  We will attempt to reach you two times.  During this call, we will ask if you have developed any symptoms of COVID 19. If you develop any symptoms (ie: fever, flu-like symptoms, shortness of breath, cough etc.) before then, please call [phone].  If you test positive for Covid 19 in the 2 weeks post procedure, please call and report this information to [place].    If any biopsies were taken you will be contacted by phone or by letter within the next 1-3 weeks.  Please call us at [phone] if you have not heard about the biopsies in 3 weeks.    SIGNATURES/CONFIDENTIALITY: You and/or your care partner have signed paperwork  which will be entered into your electronic medical record.  These signatures attest to the fact that that the information above on your After Visit Summary has been reviewed and is understood.  Full responsibility of the confidentiality of this discharge information lies with you and/or your care-partner.

## 2021-07-23 NOTE — Progress Notes (Signed)
Pt's states no medical or surgical changes since previsit or office visit. 

## 2021-07-23 NOTE — Progress Notes (Signed)
Called to room to assist during endoscopic procedure.  Patient ID and intended procedure confirmed with present staff. Received instructions for my participation in the procedure from the performing physician.  

## 2021-07-23 NOTE — Progress Notes (Signed)
PT taken to PACU. Monitors in place. VSS. Report given to RN. 

## 2021-07-24 ENCOUNTER — Telehealth (INDEPENDENT_AMBULATORY_CARE_PROVIDER_SITE_OTHER): Payer: Self-pay | Admitting: Family Medicine

## 2021-07-24 NOTE — Telephone Encounter (Signed)
Samantha Evans can you please f/u with patient.   Copied from [place] [id number]. Topic: Referral - Request for Referral >> Jul 22, 2021 12:52 PM Tessa Lerner A wrote: Has patient seen PCP for this complaint? Yes.    *If NO, is insurance requiring patient see PCP for this issue before PCP can refer them?  Referral for which specialty: Surgeon   Preferred provider/office: [facility]   Reason for referral: The patient has concerns related to a hernia

## 2021-07-25 ENCOUNTER — Ambulatory Visit: Payer: Medicaid Other | Admitting: Surgery

## 2021-07-25 ENCOUNTER — Telehealth: Payer: Self-pay

## 2021-07-25 NOTE — Telephone Encounter (Signed)
  Follow up Call-  Call back number 07/23/2021 07/18/2021 06/11/2021 05/20/2021  Post procedure Call Back phone  # [phone]  Permission to leave phone message Yes Yes Yes Yes  Some recent data might be hidden     Patient questions:  Do you have a fever, pain , or abdominal swelling? No. Pain Score  0 *  Have you tolerated food without any problems? Yes.    Have you been able to return to your normal activities? Yes.    Do you have any questions about your discharge instructions: Diet   No. Medications  No. Follow up visit  No.  Do you have questions or concerns about your Care? No.  Actions: * If pain score is 4 or above: No action needed, pain <4.

## 2021-08-04 ENCOUNTER — Other Ambulatory Visit (HOSPITAL_COMMUNITY): Payer: Self-pay

## 2021-08-06 ENCOUNTER — Other Ambulatory Visit (HOSPITAL_COMMUNITY): Payer: Self-pay

## 2021-08-13 ENCOUNTER — Other Ambulatory Visit: Payer: Self-pay

## 2021-08-13 ENCOUNTER — Emergency Department (HOSPITAL_COMMUNITY)
Admission: EM | Admit: 2021-08-13 | Discharge: 2021-08-13 | Disposition: A | Discharge status: Home / Self Care | Payer: Medicaid Other | Attending: Emergency Medicine | Admitting: Emergency Medicine

## 2021-08-13 ENCOUNTER — Emergency Department (HOSPITAL_COMMUNITY): Payer: Medicaid Other

## 2021-08-13 ENCOUNTER — Encounter (HOSPITAL_COMMUNITY): Payer: Self-pay | Admitting: Emergency Medicine

## 2021-08-13 DIAGNOSIS — Z20822 Contact with and (suspected) exposure to covid-19: Secondary | ICD-10-CM | POA: Diagnosis not present

## 2021-08-13 DIAGNOSIS — I10 Essential (primary) hypertension: Secondary | ICD-10-CM | POA: Insufficient documentation

## 2021-08-13 DIAGNOSIS — R1033 Periumbilical pain: Secondary | ICD-10-CM

## 2021-08-13 DIAGNOSIS — K7460 Unspecified cirrhosis of liver: Secondary | ICD-10-CM | POA: Diagnosis not present

## 2021-08-13 DIAGNOSIS — F17210 Nicotine dependence, cigarettes, uncomplicated: Secondary | ICD-10-CM | POA: Insufficient documentation

## 2021-08-13 DIAGNOSIS — K439 Ventral hernia without obstruction or gangrene: Secondary | ICD-10-CM | POA: Diagnosis not present

## 2021-08-13 DIAGNOSIS — Z79899 Other long term (current) drug therapy: Secondary | ICD-10-CM | POA: Insufficient documentation

## 2021-08-13 DIAGNOSIS — K429 Umbilical hernia without obstruction or gangrene: Secondary | ICD-10-CM | POA: Diagnosis not present

## 2021-08-13 DIAGNOSIS — K769 Liver disease, unspecified: Secondary | ICD-10-CM | POA: Diagnosis not present

## 2021-08-13 DIAGNOSIS — K219 Gastro-esophageal reflux disease without esophagitis: Secondary | ICD-10-CM | POA: Insufficient documentation

## 2021-08-13 LAB — COMPREHENSIVE METABOLIC PANEL
ALT: 23 U/L (ref 0–44)
AST: 38 U/L (ref 15–41)
Albumin: 3.2 g/dL — ABNORMAL LOW (ref 3.5–5.0)
Alkaline Phosphatase: 136 U/L — ABNORMAL HIGH (ref 38–126)
Anion gap: 6 (ref 5–15)
BUN: 24 mg/dL — ABNORMAL HIGH (ref 6–20)
CO2: 19 mmol/L — ABNORMAL LOW (ref 22–32)
Calcium: 11.1 mg/dL — ABNORMAL HIGH (ref 8.9–10.3)
Chloride: 109 mmol/L (ref 98–111)
Creatinine, Ser: 0.98 mg/dL (ref 0.44–1.00)
GFR, Estimated: >60 mL/min (ref >60)
Glucose, Bld: 106 mg/dL — ABNORMAL HIGH (ref 70–99)
Potassium: 3.8 mmol/L (ref 3.5–5.1)
Sodium: 134 mmol/L — ABNORMAL LOW (ref 135–145)
Total Bilirubin: 1.1 mg/dL (ref 0.3–1.2)
Total Protein: 7.3 g/dL (ref 6.5–8.1)

## 2021-08-13 LAB — CBC
HCT: 31.3 % — ABNORMAL LOW (ref 36.0–46.0)
Hemoglobin: 10.6 g/dL — ABNORMAL LOW (ref 12.0–15.0)
MCH: 31.1 pg (ref 26.0–34.0)
MCHC: 33.9 g/dL (ref 30.0–36.0)
MCV: 91.8 fL (ref 80.0–100.0)
Platelets: 113 10*3/uL — ABNORMAL LOW (ref 150–400)
RBC: 3.41 MIL/uL — ABNORMAL LOW (ref 3.87–5.11)
RDW: 14.6 % (ref 11.5–15.5)
WBC: 4.3 10*3/uL (ref 4.0–10.5)
nRBC: 0 % (ref 0.0–0.2)

## 2021-08-13 LAB — URINALYSIS, ROUTINE W REFLEX MICROSCOPIC
Bacteria, UA: NONE SEEN
Bilirubin Urine: NEGATIVE
Glucose, UA: NEGATIVE mg/dL
Ketones, ur: NEGATIVE mg/dL
Leukocytes,Ua: NEGATIVE
Nitrite: NEGATIVE
Protein, ur: NEGATIVE mg/dL
Specific Gravity, Urine: 1.015 (ref 1.005–1.030)
pH: 5 (ref 5.0–8.0)

## 2021-08-13 LAB — RESP PANEL BY RT-PCR (FLU A&B, COVID) ARPGX2
Influenza A by PCR: NEGATIVE
Influenza B by PCR: NEGATIVE
SARS Coronavirus 2 by RT PCR: NEGATIVE

## 2021-08-13 LAB — LACTIC ACID, PLASMA: Lactic Acid, Venous: 1 mmol/L (ref 0.5–1.9)

## 2021-08-13 IMAGING — CT CT ABD-PELV W/ CM
2 of 5 series · 16 of 46 positions shown, 18 images · IV contrast (APPLIED)
Comparison: MR abdomen [DATE] and CT abdomen pelvis [DATE].

CLINICAL DATA: Abdominal pain, hernia suspected.

EXAM:
CT ABDOMEN AND PELVIS WITH CONTRAST
TECHNIQUE: Multidetector CT imaging of the abdomen and pelvis was performed
using the standard protocol following bolus administration of
intravenous contrast.
CONTRAST:  70mL OMNIPAQUE IOHEXOL 350 MG/ML SOLN

[Series 3: abd/ pelvis 5.0 i30f 2 · axial · 0.92mm/px · z∈[+661,+1046]mm · 13 of 87 slices shown, 15 images]
[im 5/87  soft-tissue]
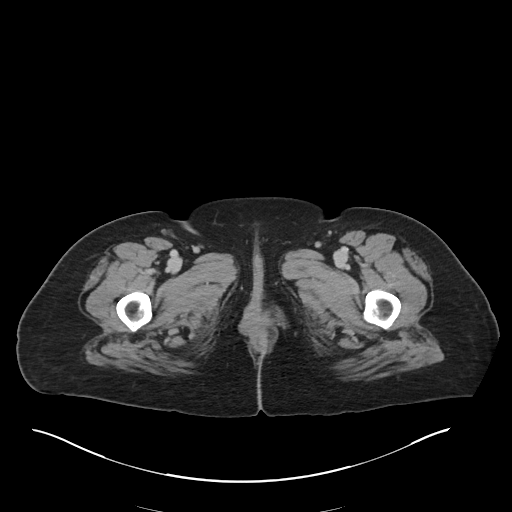
[im 5/87  bone]
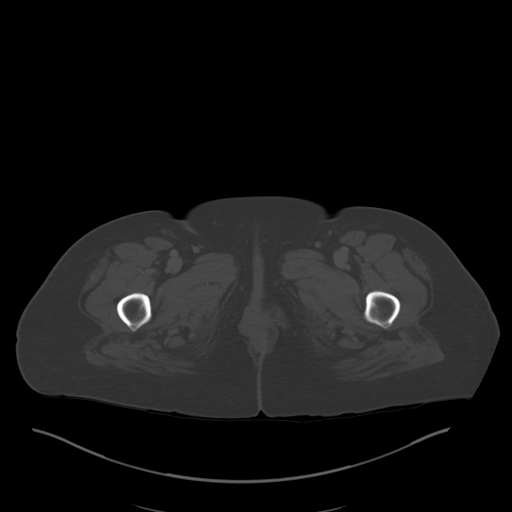
[im 13/87  soft-tissue]
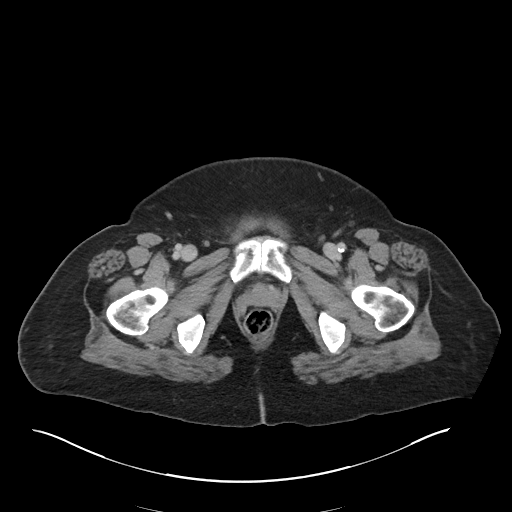
[im 18/87  soft-tissue]
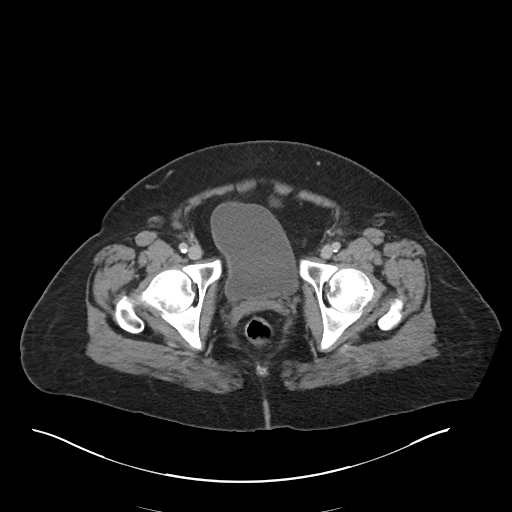
[im 26/87  soft-tissue]
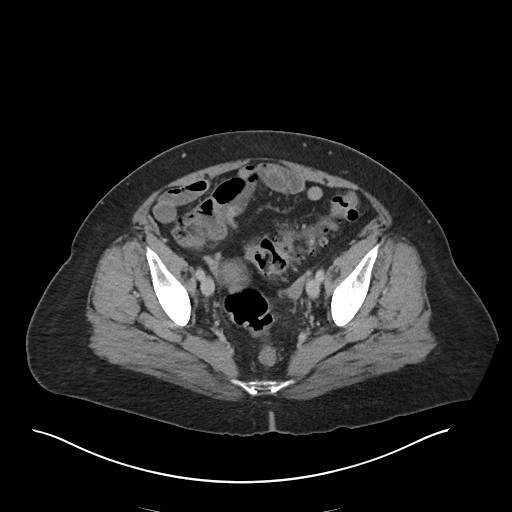
[im 31/87  soft-tissue]
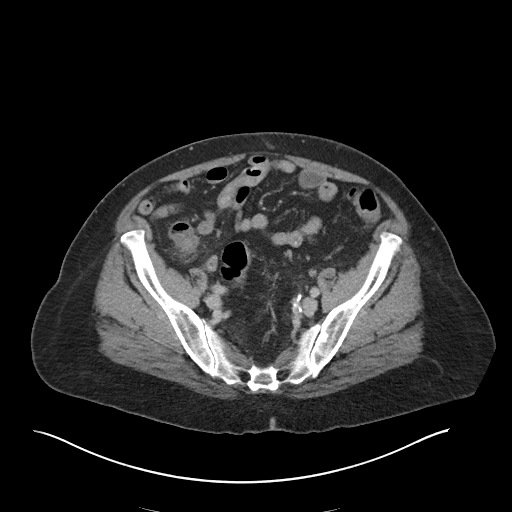
[im 39/87  soft-tissue]
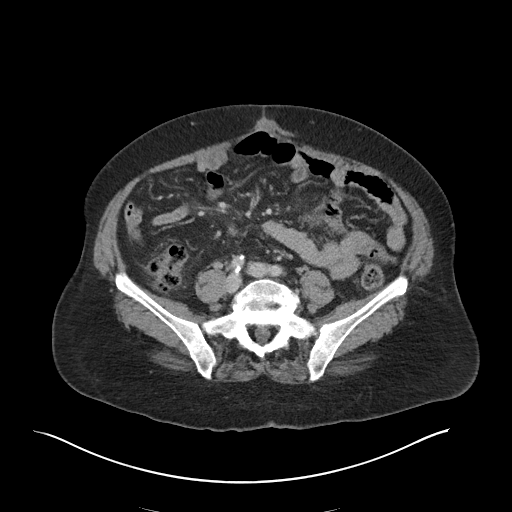
[im 44/87  soft-tissue]
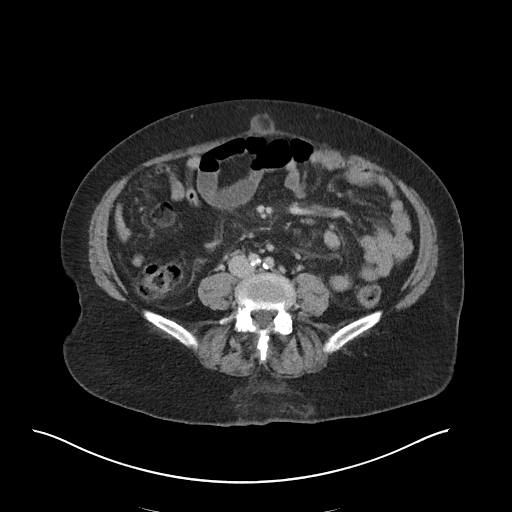
[im 48/87  soft-tissue]
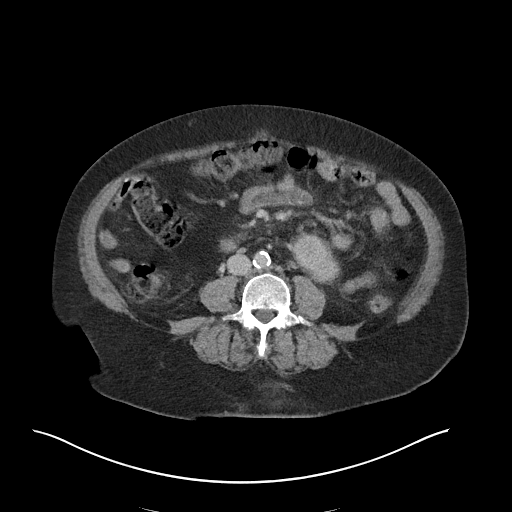
[im 56/87  soft-tissue]
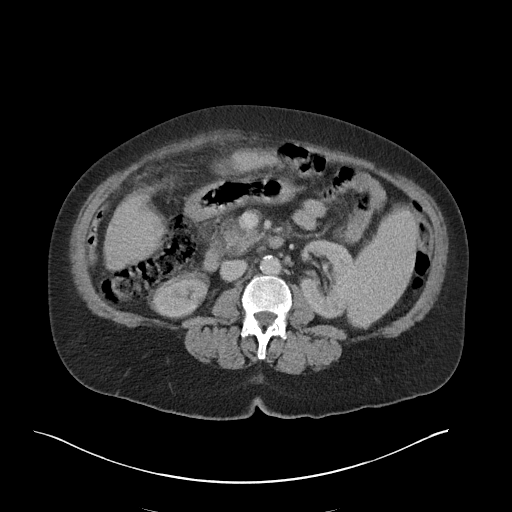
[im 56/87  bone]
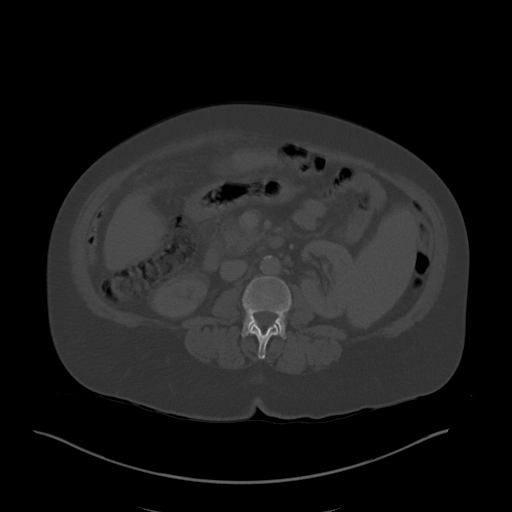
[im 61/87  soft-tissue]
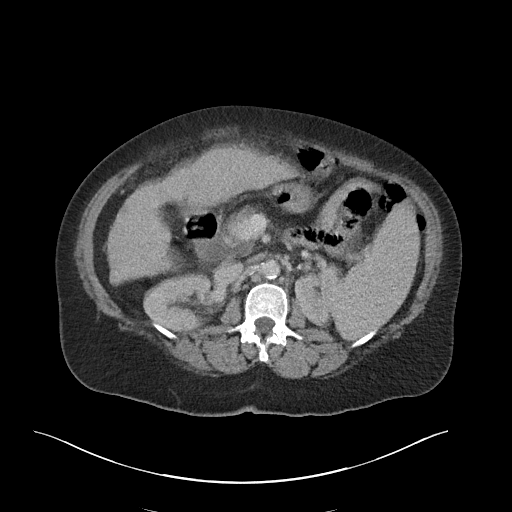
[im 69/87  soft-tissue]
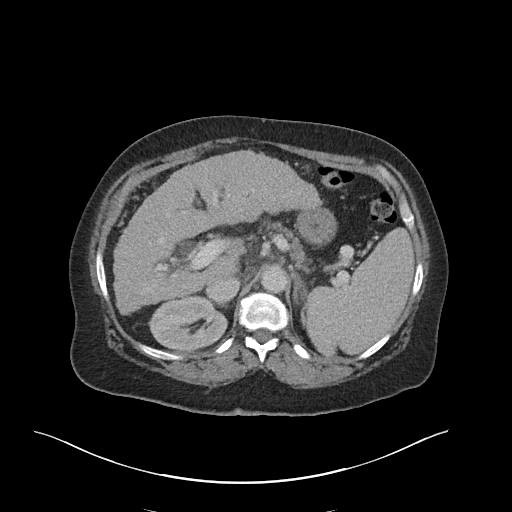
[im 74/87  soft-tissue]
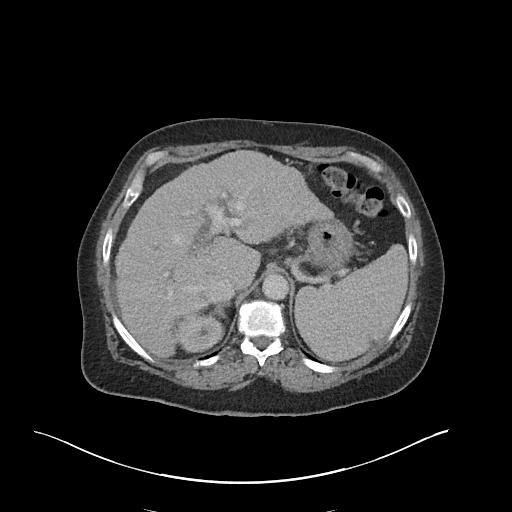
[im 82/87  soft-tissue]
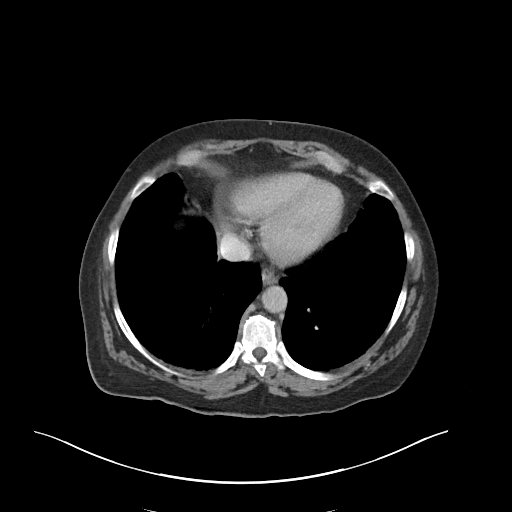

[Series 6: coronal soft tissue · coronal · 0.84mm/px · 3 of 100 slices shown]
[im 34/100  soft-tissue]
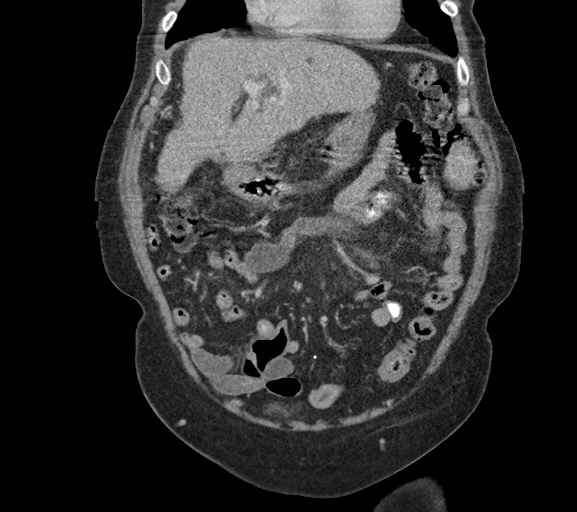
[im 45/100  soft-tissue]
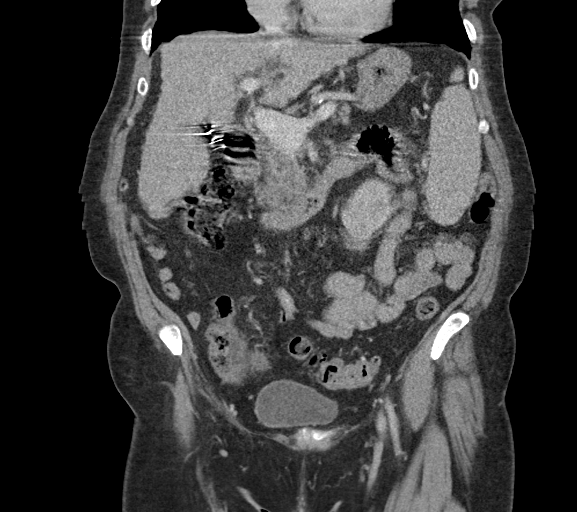
[im 56/100  soft-tissue]
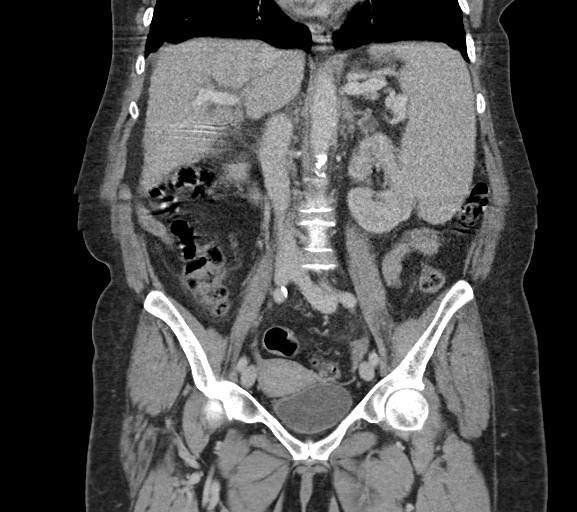

[16 of 46 positions shown; findings below may reference images not displayed]

FINDINGS: Note: Image quality is degraded by respiratory motion.

Lower chest: Lung bases are clear. Heart is at the upper limits of
normal in size to mildly enlarged. Atherosclerotic calcification of
the aorta and coronary arteries. No pericardial or pleural effusion.
Distal esophagus is unremarkable.

Hepatobiliary: Liver margin is markedly irregular. Low-attenuation
lesions in the liver measure up to 1.6 cm and are too small to
characterize. Cholecystectomy. Biliary ductal dilatation is likely
secondary.

Pancreas: Negative.

Spleen: Enlarged, 15.2 cm.  Otherwise unremarkable.

Adrenals/Urinary Tract: Adrenal glands are unremarkable.
Subcentimeter low-attenuation lesion in the upper pole right kidney
is too small to characterize but statistically, a cyst is likely.
Kidneys are otherwise unremarkable. Ureters are decompressed.
Bladder is unremarkable.

Stomach/Bowel: Stomach, small bowel, appendix and colon are
unremarkable.

Vascular/Lymphatic: Atherosclerotic calcification of the aorta. No
pathologically enlarged lymph nodes.

Reproductive: Uterus is visualized.  No adnexal mass.

Other: Trace perihepatic ascites. No free air. Tiny supraumbilical
midline ventral hernia contains fat and fluid. Previous umbilical
hernia repair. There may be a tiny infraumbilical midline ventral
hernia containing fluid as well. Mesenteries and peritoneum are
otherwise unremarkable.

Musculoskeletal: Degenerative changes in the spine. No worrisome
lytic or sclerotic lesions.
IMPRESSION: 1. Previous umbilical hernia repair. Tiny supraumbilical and
infraumbilical midline ventral hernias contain fluid.
2. Cirrhosis with perihepatic ascites and splenomegaly.
Low-attenuation lesions in the liver were better characterized on MR
abdomen [DATE].
[DATE]. Aortic atherosclerosis ([N1]-[N1]). Coronary artery
calcification.

## 2021-08-13 MED ORDER — MORPHINE SULFATE (PF) 4 MG/ML IV SOLN
4.0000 mg | Freq: Once | INTRAVENOUS | Status: AC
  Administered 2021-08-13: 4 mg via INTRAVENOUS
  Filled 2021-08-13: qty 1

## 2021-08-13 MED ORDER — OXYCODONE HCL 5 MG PO TABS: 5.0000 mg | ORAL_TABLET | ORAL | 0 refills | Status: DC | PRN

## 2021-08-13 MED ORDER — IOHEXOL 350 MG/ML SOLN
70.0000 mL | Freq: Once | INTRAVENOUS | Status: AC | PRN
  Administered 2021-08-13: 70 mL via INTRAVENOUS

## 2021-08-13 MED ORDER — ONDANSETRON HCL 4 MG PO TABS: 4.0000 mg | ORAL_TABLET | Freq: Four times a day (QID) | ORAL | 0 refills | Status: DC

## 2021-08-13 MED ORDER — ONDANSETRON HCL 4 MG/2ML IJ SOLN
4.0000 mg | Freq: Once | INTRAMUSCULAR | Status: AC
  Administered 2021-08-13: 4 mg via INTRAVENOUS
  Filled 2021-08-13: qty 2

## 2021-08-13 NOTE — ED Provider Notes (Signed)
[facility] EMERGENCY DEPARTMENT Provider Note   CSN: [id number] Arrival date & time: 08/13/21  1029     History Chief Complaint  Patient presents with   Hernia    Samantha Evans is a 60 y.o. female.  The history is provided by the patient and medical records.  Abdominal Pain Pain location:  Periumbilical Pain quality: aching   Pain radiates to:  Does not radiate Pain severity:  Severe Onset quality:  Gradual Duration:  3 weeks (worse over last 2 days) Timing:  Constant Progression:  Worsening Chronicity:  New Context: previous surgery   Relieved by:  Nothing Worsened by:  Palpation and movement Ineffective treatments:  None tried Associated symptoms: nausea   Associated symptoms: no chest pain, no chills, no constipation, no cough, no diarrhea, no dysuria, no fatigue, no fever, no shortness of breath and no vomiting       Past Medical History:  Diagnosis Date   Arthritis    Carpal tunnel syndrome    Cataracts, bilateral    Cirrhosis ([place])    Hypertension     Patient Active Problem List   Diagnosis Date Noted   Peripheral edema 05/12/2021   Chronic hepatitis C without hepatic coma ([place]) 03/25/2021   Acid reflux 03/25/2021   Incarcerated umbilical hernia 42/70/6237   Small bowel obstruction ([place]) 02/27/2021   Hepatic cirrhosis ([place])- by CT scan 02/27/2021   AKI (acute kidney injury) ([place]) 02/27/2021    Past Surgical History:  Procedure Laterality Date   ERCP     UMBILICAL HERNIA REPAIR N/A 02/27/2021   Procedure: REPAIR INCARCERATED UMBILICAL HERNIA;  Surgeon: Georganna Skeans, MD;  Location: [facility];  Service: General;  Laterality: N/A;     OB History   No obstetric history on file.     Family History  Problem Relation Age of Onset   Lung cancer Father    Liver disease Neg Hx    Colon cancer Neg Hx    Esophageal cancer Neg Hx    Pancreatic cancer Neg Hx    Stomach cancer Neg Hx     Social History   Tobacco Use   Smoking  status: Every Day    Packs/day: 0.50    Years: 40.00    Pack years: 20.00    Types: Cigarettes   Smokeless tobacco: Never   Tobacco comments:    Quit smoking on 02/27/2021 when hernia repair surgery occurred.  Back to smoking .5 pack of cigarettes daily  Vaping Use   Vaping Use: Never used  Substance Use Topics   Alcohol use: Not Currently   Drug use: Not Currently    Home Medications Prior to Admission medications   Medication Sig Start Date End Date Taking? Authorizing Provider  amLODipine (NORVASC) 10 MG tablet Take 1 tablet (10 mg total) by mouth daily. 07/03/21   [name], MD  psyllium (METAMUCIL SMOOTH TEXTURE) 58.6 % powder Take 1 packet by mouth daily. 06/11/21   [name], MD  Sofosbuvir-Velpatasvir (EPCLUSA) 400-100 MG TABS Take 1 tablet by mouth daily. 04/08/21   Kuppelweiser, [name], RPH-CPP  spironolactone (ALDACTONE) 50 MG tablet Take 1 tablet (50 mg total) by mouth daily. 06/23/21   [name], MD    Allergies    Patient has no known allergies.  Review of Systems   Review of Systems  Constitutional:  Negative for chills, fatigue and fever.  HENT:  Negative for congestion.   Respiratory:  Negative for cough, chest tightness and shortness of  breath.   Cardiovascular:  Negative for chest pain.  Gastrointestinal:  Positive for abdominal pain and nausea. Negative for constipation, diarrhea and vomiting.  Genitourinary:  Positive for frequency (chronic). Negative for dysuria and flank pain.  Musculoskeletal:  Negative for back pain, neck pain and neck stiffness.  Skin:  Negative for wound.  Neurological:  Negative for light-headedness and headaches.  Psychiatric/Behavioral:  Negative for agitation.   All other systems reviewed and are negative.  Physical Exam Updated Vital Signs BP (!) 144/59   Pulse 69   Temp 97.8 F (36.6 C) (Oral)   Resp 14   SpO2 99%   Physical Exam Vitals and nursing note reviewed.  Constitutional:      General: She  is not in acute distress.    Appearance: She is well-developed. She is not ill-appearing, toxic-appearing or diaphoretic.  HENT:     Head: Normocephalic and atraumatic.     Nose: Nose normal.     Mouth/Throat:     Mouth: Mucous membranes are moist.  Eyes:     Conjunctiva/sclera: Conjunctivae normal.  Cardiovascular:     Rate and Rhythm: Normal rate and regular rhythm.     Heart sounds: No murmur heard. Pulmonary:     Effort: Pulmonary effort is normal. No respiratory distress.     Breath sounds: Normal breath sounds. No wheezing, rhonchi or rales.  Chest:     Chest wall: No tenderness.  Abdominal:     General: Abdomen is flat. There is distension.     Palpations: Abdomen is soft.     Tenderness: There is abdominal tenderness. There is no right CVA tenderness, left CVA tenderness, guarding or rebound.     Hernia: A hernia is present.  Musculoskeletal:        General: No tenderness.     Cervical back: Neck supple.  Skin:    General: Skin is warm and dry.     Findings: No erythema.  Neurological:     General: No focal deficit present.     Mental Status: She is alert.  Psychiatric:        Mood and Affect: Mood normal.    ED Results / Procedures / Treatments   Labs (all labs ordered are listed, but only abnormal results are displayed) Labs Reviewed  COMPREHENSIVE METABOLIC PANEL - Abnormal; Notable for the following components:      Result Value   Sodium 134 (*)    CO2 19 (*)    Glucose, Bld 106 (*)    BUN 24 (*)    Calcium 11.1 (*)    Albumin 3.2 (*)    Alkaline Phosphatase 136 (*)    All other components within normal limits  CBC - Abnormal; Notable for the following components:   RBC 3.41 (*)    Hemoglobin 10.6 (*)    HCT 31.3 (*)    Platelets 113 (*)    All other components within normal limits  URINALYSIS, ROUTINE W REFLEX MICROSCOPIC - Abnormal; Notable for the following components:   APPearance HAZY (*)    Hgb urine dipstick MODERATE (*)    All other  components within normal limits  RESP PANEL BY RT-PCR (FLU A&B, COVID) ARPGX2  LACTIC ACID, PLASMA  LIPASE, BLOOD  LACTIC ACID, PLASMA    EKG None  Radiology CT ABDOMEN PELVIS W CONTRAST  Result Date: 08/13/2021 CLINICAL DATA:  Abdominal pain, hernia suspected. EXAM: CT ABDOMEN AND PELVIS WITH CONTRAST TECHNIQUE: Multidetector CT imaging of the abdomen and pelvis was  performed using the standard protocol following bolus administration of intravenous contrast. CONTRAST:  25mL OMNIPAQUE IOHEXOL 350 MG/ML SOLN COMPARISON:  MR abdomen 03/01/2021 and CT abdomen pelvis 02/27/2021. FINDINGS: Note: Image quality is degraded by respiratory motion. Lower chest: Lung bases are clear. Heart is at the upper limits of normal in size to mildly enlarged. Atherosclerotic calcification of the aorta and coronary arteries. No pericardial or pleural effusion. Distal esophagus is unremarkable. Hepatobiliary: Liver margin is markedly irregular. Low-attenuation lesions in the liver measure up to 1.6 cm and are too small to characterize. Cholecystectomy. Biliary ductal dilatation is likely secondary. Pancreas: Negative. Spleen: Enlarged, 15.2 cm.  Otherwise unremarkable. Adrenals/Urinary Tract: Adrenal glands are unremarkable. Subcentimeter low-attenuation lesion in the upper pole right kidney is too small to characterize but statistically, a cyst is likely. Kidneys are otherwise unremarkable. Ureters are decompressed. Bladder is unremarkable. Stomach/Bowel: Stomach, small bowel, appendix and colon are unremarkable. Vascular/Lymphatic: Atherosclerotic calcification of the aorta. No pathologically enlarged lymph nodes. Reproductive: Uterus is visualized.  No adnexal mass. Other: Trace perihepatic ascites. No free air. Tiny supraumbilical midline ventral hernia contains fat and fluid. Previous umbilical hernia repair. There may be a tiny infraumbilical midline ventral hernia containing fluid as well. Mesenteries and peritoneum  are otherwise unremarkable. Musculoskeletal: Degenerative changes in the spine. No worrisome lytic or sclerotic lesions. IMPRESSION: 1. Previous umbilical hernia repair. Tiny supraumbilical and infraumbilical midline ventral hernias contain fluid. 2. Cirrhosis with perihepatic ascites and splenomegaly. Low-attenuation lesions in the liver were better characterized on MR abdomen 03/01/2021. 3. Aortic atherosclerosis (ICD10-I70.0). Coronary artery calcification. Electronically Signed   By: Lorin Picket M.D.   On: 08/13/2021 14:20    Procedures Procedures   Medications Ordered in ED Medications  morphine 4 MG/ML injection 4 mg (has no administration in time range)  ondansetron (ZOFRAN) injection 4 mg (has no administration in time range)    ED Course  I have reviewed the triage vital signs and the nursing notes.  Pertinent labs & imaging results that were available during my care of the patient were reviewed by me and considered in my medical decision making (see chart for details).    MDM Rules/Calculators/A&P                           RAVLEEN RIES is a 60 y.o. female with a past medical history significant for previous incarcerated hernia status postrepair earlier this year, hypertension, cirrhosis, and cataracts who presents with abdominal pain and nausea.  Patient reports that for the last 3 weeks, she has been having some 3 out of 10 abdominal pain just superficial to her umbilicus.  She reports that her previous incarcerated hernia status post repair is inferior to the umbilicus and she reports that that hernia has been not bothering her.  She says that over the last 3 weeks it is continued but then over the last 2 days it worsened significantly from a 3 out of 10 up to an 8 out of 10.  She reports nausea but no vomiting.  She reports she always has frequent urination and has not different.  She is still passing gas and having normal bowel movements.  She says it feels like it did when  her last one was incarcerated and so she was concerned.  He denies other trauma, fevers, chills, ingestion, cough, or any URI symptoms.  No rashes or skin changes reported.  On exam, abdomen is tender to palpation and I can  palpate what feels to be a hernia superior to her umbilicus.  When I try to push she was in significant discomfort and it was not feeling to easily be reducible.  Bowel sounds were appreciated.  Back was nontender.  Lungs otherwise clear.  Patient will get CT scan to look for incarcerated hernia recurrence.  We will give her some pain medicine, nausea medicine, and she will remain n.p.o.  She will also have some screening labs and potentially preprocedural COVID test.  Anticipate reassessment after imaging and work-up.  CT scan shows no evidence of acute incarceration or bowel and hernia.  A small fluid collection was seen with a hernia.  Patient was feeling much better after medications.  Other labs overall reassuring.  Patient will follow-up with general surgery to discuss outpatient management.  She will use a hernia belt that she still has somewhere in her home.  She was sent a prescription for pain medicine and nausea medicine.  She no other questions or concerns and was discharged in good condition with improved symptoms after reassuring work-up.   Final Clinical Impression(s) / ED Diagnoses Final diagnoses:  Periumbilical abdominal pain  Umbilical hernia without obstruction and without gangrene    Rx / DC Orders ED Discharge Orders          Ordered    oxyCODONE (ROXICODONE) 5 MG immediate release tablet  Every 4 hours PRN,   Status:  Discontinued        08/13/21 1619    ondansetron (ZOFRAN) 4 MG tablet  Every 6 hours        08/13/21 1619    oxyCODONE (ROXICODONE) 5 MG immediate release tablet  Every 4 hours PRN        08/13/21 1621           Clinical Impression: 1. Periumbilical abdominal pain   2. Umbilical hernia without obstruction and without  gangrene     Disposition: Discharge  Condition: Good  I have discussed the results, Dx and Tx plan with the pt(& family if present). He/she/they expressed understanding and agree(s) with the plan. Discharge instructions discussed at great length. Strict return precautions discussed and pt &/or family have verbalized understanding of the instructions. No further questions at time of discharge.    New Prescriptions   ONDANSETRON (ZOFRAN) 4 MG TABLET    Take 1 tablet (4 mg total) by mouth every 6 (six) hours.   OXYCODONE (ROXICODONE) 5 MG IMMEDIATE RELEASE TABLET    Take 1 tablet (5 mg total) by mouth every 4 (four) hours as needed for severe pain.    Follow Up: Surgery, [facility] [address] [place] [address] [phone]     [facility] EMERGENCY DEPARTMENT [id number] [place], [place], [place] [address] [phone]        Nima Kemppainen, Gwenyth Allegra, MD 08/13/21 1640

## 2021-08-13 NOTE — ED Triage Notes (Signed)
Patient here for evaluation of abdominal hernia that she became aware of over one month ago, states two days ago the pain became worse. Denies changes in bowel movements, reports mild nausea but denies vomiting. Patient alert, oriented, and in no apparent distress at this time.

## 2021-08-13 NOTE — Discharge Instructions (Signed)
Your history, exam, and work-up are consistent with something like related to the small umbilical hernia that you have.  There is no evidence of incarceration or bowel involved and given your improvement in symptoms and reassuring work-up otherwise, we feel you are safe for discharge home.  Please call to follow-up with your general surgery team and please use the pain and nausea medicine to help with your symptoms.  If any symptoms change or worsen, please return to the nearest emergency department.

## 2021-08-14 ENCOUNTER — Telehealth: Payer: Self-pay

## 2021-08-14 NOTE — Telephone Encounter (Signed)
Transition Care Management Unsuccessful Follow-up Telephone Call  Date of discharge and from where:  08/13/2021-[facility]   Attempts:  1st Attempt  Reason for unsuccessful TCM follow-up call:  Voice mail full

## 2021-08-16 DIAGNOSIS — Z419 Encounter for procedure for purposes other than remedying health state, unspecified: Secondary | ICD-10-CM | POA: Diagnosis not present

## 2021-08-18 NOTE — Telephone Encounter (Signed)
Can you please assist in setting up [facility] transportation for the patient?  Thank you.

## 2021-08-18 NOTE — Telephone Encounter (Addendum)
Transition Care Management Follow-up Telephone Call Date of discharge and from where: 08/13/2021 from [facility] ED How have you been since you were released from the hospital? Pt stated that she is still having some pain. Pt informed of recommendation to follow up with PCP and general surgery.  Any questions or concerns? No  Items Reviewed: Did the pt receive and understand the discharge instructions provided? Yes  Medications obtained and verified? Yes  Other? No  Any new allergies since your discharge? No  Dietary orders reviewed? No Do you have support at home? Yes   Functional Questionnaire: (I = Independent and D = Dependent) ADLs: I  Bathing/Dressing- I  Meal Prep- I  Eating- I  Maintaining continence- I  Transferring/Ambulation- I  Managing Meds- I   Follow up appointments reviewed:  PCP Hospital f/u appt confirmed? No   Specialist Hospital f/u appt confirmed? No  Are transportation arrangements needed? Yes  - patient informed of services through [facility] and through [facility]. Patient may need additional help setting up transportation through [facility]. [facility] Transportation request can be sent via email or routed back to me for assistance.  If their condition worsens, is the pt aware to call PCP or go to the Emergency Dept.? Yes Was the patient provided with contact information for the PCP's office or ED? Yes Was to pt encouraged to call back with questions or concerns? Yes

## 2021-08-19 ENCOUNTER — Other Ambulatory Visit: Payer: Self-pay

## 2021-08-19 NOTE — Patient Outreach (Signed)
08/19/2021 Name: Samantha Evans MRN: [id number] DOB: 1961-06-17  Referred by: [name], MD Reason for referral : High Risk Managed Medicaid (Unsuccessful Telephone Outreach )   An unsuccessful telephone outreach was attempted today. The patient was referred to the case management team for assistance with care management and care coordination. This RN Case Manager attempted a telephone contact today at three different times but the patient's phone voice mailbox was full and would not allow me to leave a voicemail message.   Follow Up Plan: The Managed Medicaid care management team will reach out to the patient again over the next 14 days.    Salvatore Marvel RN, BSN Community Care Coordinator [facility] Mobile: [phone]

## 2021-08-20 ENCOUNTER — Telehealth: Payer: Self-pay | Admitting: Family Medicine

## 2021-08-20 NOTE — Telephone Encounter (Signed)
..   Medicaid Managed Care   Unsuccessful Outreach Note  08/20/2021 Name: Samantha Evans MRN: [id number] DOB: 1960-11-24  Referred by: [name], MD Reason for referral : High Risk Managed Medicaid (I called the patient today to get her phone visit with the [facility] RNCM rescheduled. She did not answer and her VM was full.)   An unsuccessful telephone outreach was attempted today. The patient was referred to the case management team for assistance with care management and care coordination.   Follow Up Plan: The care management team will reach out to the patient again over the next 7 days.   [place]

## 2021-08-21 ENCOUNTER — Encounter: Payer: Medicaid Other | Admitting: Gastroenterology

## 2021-08-22 NOTE — Telephone Encounter (Signed)
Patient transportation request has been emailed to the [employer].

## 2021-08-25 ENCOUNTER — Telehealth: Payer: Self-pay | Admitting: Family Medicine

## 2021-08-25 NOTE — Telephone Encounter (Signed)
Hi Samantha Evans, can you please see message below, contact her and refer to ophthalmologist who accepts her insurance?  Thank you.

## 2021-08-25 NOTE — Telephone Encounter (Signed)
Hello, I spoke to this patient today to get her rescheduled for an appointment and she asked me to send you a message about her cataracts. Patient states she was referred to an office that does not accept her Managed Medicaid and her eye sight is getting worse. Patient states she cannot see the phone to dial the number to her PCP office. Patient would like to speak to someone from your office concerning this matter.  Thanks,  [place], High Risk [place]

## 2021-08-27 ENCOUNTER — Encounter (HOSPITAL_COMMUNITY): Payer: Self-pay

## 2021-08-27 ENCOUNTER — Ambulatory Visit (HOSPITAL_COMMUNITY): Admission: RE | Admit: 2021-08-27 | Payer: Medicaid Other | Source: Ambulatory Visit

## 2021-08-31 ENCOUNTER — Ambulatory Visit (HOSPITAL_COMMUNITY): Admission: RE | Admit: 2021-08-31 | Payer: Medicaid Other | Source: Ambulatory Visit

## 2021-08-31 ENCOUNTER — Encounter (HOSPITAL_COMMUNITY): Payer: Self-pay

## 2021-09-01 NOTE — Telephone Encounter (Signed)
I was unable to contact her mail  referral to   Sent Referral to [place] # [phone] address [address] they will contact the patient to schedule an appointment.Sent Referral and Notes in [place] ([facility]) PROGRAM

## 2021-09-02 ENCOUNTER — Other Ambulatory Visit: Payer: Self-pay

## 2021-09-02 ENCOUNTER — Other Ambulatory Visit (HOSPITAL_COMMUNITY): Payer: Self-pay

## 2021-09-02 NOTE — Patient Outreach (Signed)
Medicaid Managed Care   Nurse Care Manager Note  09/02/2021 Name:  Samantha Evans MRN:  [id number] DOB:  September 11, 1961  Samantha Evans is an 60 y.o. year old female who is a primary patient of [name], MD.  The [facility] Managed Care Coordination team was consulted for assistance with:    HTN Hepatitis C Hernia Issues  Ms. Samantha Evans was given information about Medicaid Managed Care Coordination team services today. [place] Patient agreed to services and verbal consent obtained.  Engaged with patient by telephone for follow up visit in response to provider referral for case management and/or care coordination services.   Assessments/Interventions:  Review of past medical history, allergies, medications, health status, including review of consultants reports, laboratory and other test data, was performed as part of comprehensive evaluation and provision of chronic care management services.  SDOH (Social Determinants of Health) assessments and interventions performed: SDOH Interventions    Flowsheet Row Most Recent Value  SDOH Interventions   Transportation Interventions [facility]       Care Plan  No Known Allergies  Medications Reviewed Today     Reviewed by Inge Rise, RN (Case Manager) on 09/02/21 at 1142  Med List Status: <None>   Medication Order Taking? Sig Documenting Provider Last Dose Status Informant  amLODipine (NORVASC) 10 MG tablet [id number] No Take 1 tablet (10 mg total) by mouth daily. [name], MD 07/22/2021 Active   ondansetron (ZOFRAN) 4 MG tablet [id number]  Take 1 tablet (4 mg total) by mouth every 6 (six) hours. Evans, Samantha Allegra, MD  Active   oxyCODONE (ROXICODONE) 5 MG immediate release tablet [id number]  Take 1 tablet (5 mg total) by mouth every 4 (four) hours as needed for severe pain. Evans, Samantha Allegra, MD  Active   psyllium (METAMUCIL SMOOTH TEXTURE) 58.6 % powder [id number] No Take 1 packet by mouth daily.  [name], MD Past Week Active   Sofosbuvir-Velpatasvir [facility]) 400-100 MG TABS [id number] No Take 1 tablet by mouth daily. Evans, Samantha L, RPH-CPP 07/22/2021 Active   spironolactone (ALDACTONE) 50 MG tablet [id number] No Take 1 tablet (50 mg total) by mouth daily. [name], MD 07/22/2021 Active             Patient Active Problem List   Diagnosis Date Noted   Peripheral edema 05/12/2021   Chronic hepatitis C without hepatic coma ([place]) 03/25/2021   Acid reflux 03/25/2021   Incarcerated umbilical hernia 27/78/2423   Small bowel obstruction ([place]) 02/27/2021   Hepatic cirrhosis ([place])- by CT scan 02/27/2021   AKI (acute kidney injury) ([place]) 02/27/2021    Conditions to be addressed/monitored per PCP order:  HTN and Hepatitis C and Hernia Issues  Care Plan : RNCM Plan of Care  Updates made by Inge Rise, RN since 09/02/2021 12:00 AM     Problem: Care Management and Care Coordination Needs for HTN, Hepatitis C and reoccurring hernia issues      Long-Range Goal: RNCM Plan of Care for Care Management and Care Coordination Needs   Start Date: 07/22/2021  Expected End Date: 12/20/2021  Priority: High  Note:   Current Barriers:  Knowledge Deficits related to plan of care for management of HTN and Hepatitis C and reoccurring hernia issues  Care Coordination needs related to Transportation  Chronic Disease Management support and education needs related to HTN and Hepatitis C and reoccurring hernia issues Transportation barriers No Advanced Directives in place  RNCM Clinical Goal(s):  Patient will verbalize  understanding of plan for management of HTN and Hepatitis C and reoccurring hernia issues verbalize basic understanding of HTN and Hepatitis C and reoccurring hernia issues disease process and self health management plan   take all medications exactly as prescribed and will call provider for medication related questions attend all scheduled medical  appointments: 09/05/21 Dr Doren Custard (ID) and 09/08/21 GI Doctor [name] demonstrate ongoing adherence to prescribed treatment plan for HTN and Hepatitis C and reoccurring hernia issues as evidenced by adherence to prescribed medication regimen contacting provider for new or worsened symptoms or questions and adhering to low sodium diet   continue to work with RN Care Manager to address care management and care coordination needs related to HTN and Hepatitis C and reoccurring hernia issues  through collaboration with RN Care manager, provider, and care team.   Interventions: Inter-disciplinary care team collaboration (see longitudinal plan of care) Evaluation of current treatment plan related to  self management and patient's adherence to plan as established by provider   Hepatitis C and Reoccurring Hernia Issues  (Status: New goal.) Evaluation of current treatment plan related to  Hepatitis C and reoccurring hernia issues.  ,  Transportation needs addressed and self-management and patient's adherence to plan as established by provider. Discussed plans with patient for ongoing care management follow up and provided patient with direct contact information for care management team Advised patient to report any changes in hernia to PCP; Provided education to patient re: pain management for any hernia pain including wearing hernia belt or supportive underwear/clothing to support abdominal area. Reviewed medications with patient and discussed importance of adhering to medication regimen. Reviewed scheduled/upcoming provider appointments including 09/05/21 Dr Doren Custard (ID).  Hypertension: (Status: New goal.) Last practice recorded BP readings:  BP Readings from Last 3 Encounters:  08/13/21 120/80  07/23/21 132/61  07/18/21 (!) 124/50    Most recent eGFR/CrCl: No results found for: EGFR  No components found for: CRCL  Evaluation of current treatment plan related to hypertension self management  and patient's adherence to plan as established by provider.   Reviewed prescribed diet : low sodium diet.  Patient continues to adhere to not adding any salt to food or cooking. Reviewed medications with patient and discussed importance of compliance;  Provided assistance with obtaining home blood pressure monitor via free medicaid plan but patient refused and feels she does not need one at this time;  Discussed plans with patient for ongoing care management follow up and provided patient with direct contact information for care management team; Reviewed scheduled/upcoming provider appointments including: Dr Doren Custard on 09/05/2021 and [name] (GI) on 09/08/21 Continued education on prescribed diet low sodium diet Assessed social determinant of health barriers.  Patient Goals/Self-Care Activities: Patient will self administer medications as prescribed Patient will attend all scheduled provider appointments Patient will call pharmacy for medication refills Patient will continue to perform ADL's independently Patient will continue to perform IADL's independently Patient will call provider office for new concerns or questions       Follow Up:  Patient agrees to Care Plan and Follow-up.  Plan: The Managed Medicaid care management team will reach out to the patient again over the next 30 days.  Date/time of next scheduled RN care management/care coordination outreach:  09/30/2021 at 10:30 am  [place], [facility] Mobile: [phone]

## 2021-09-02 NOTE — Patient Instructions (Signed)
Visit Information  Samantha Evans was given information about Medicaid Managed Care team care coordination services as a part of their [facility] Medicaid benefit. Samantha Evans verbally consented to engagement with the [facility] Managed Care team.   If you are experiencing a medical emergency, please call 911 or report to your local emergency department or urgent care.   If you have a non-emergency medical problem during routine business hours, please contact your provider's office and ask to speak with a nurse.   For questions related to your [facility] health plan, please call: [phone] or go here:[email or web address]  If you would like to schedule transportation through your [facility] plan, please call the following number at least 2 days in advance of your appointment: [phone].  Call the [place] at [phone], at any time, 24 hours a day, 7 days a week. If you are in danger or need immediate medical attention call 911.  If you would like help to quit smoking, call 1-800-QUIT-NOW 563-521-2373) OR Espaol: 1-855-Djelo-Ya (8-850-277-4128) o para ms informacin haga clic aqu or Text READY to 200-400 to register via text  Samantha Evans - following are the goals we discussed in your visit today:   Goals Addressed             This Visit's Progress    RNCM - Hepatitis Monitored & Managed       Timeframe:  Long-Range Goal Priority:  High Start Date:  07/22/2021                           Expected End Date:   12/20/2021       Patient Goals:   Patient will self administer medications as prescribed Patient will attend all scheduled provider appointments Patient will call pharmacy for medication refills Patient will continue to perform ADL's independently Patient will continue to perform IADL's independently Patient will call provider office for new concerns or questions               RNCM - Hypertension Monitored & Managed        Timeframe:  Long-Range Goal Priority:  High Start Date:  07/22/2021                           Expected End Date:  12/20/2021     Patient Goals: Patient will self administer medications as prescribed Patient will attend all scheduled provider appointments Patient will call pharmacy for medication refills Patient will continue to perform ADL's independently Patient will continue to perform IADL's independently Patient will call provider office for new concerns or questions                 RNCM - Reoccurring Hernia Issues Monitored & Managed       Timeframe:  Long-Range Goal Priority:  High Start Date: 07/22/2021                           Expected End Date: 12/20/2021     Patient Goals:   *    Patient will self administer medications as prescribed Patient will attend all scheduled provider appointments Patient will call pharmacy for medication refills Patient will continue to perform ADL's independently Patient will continue to perform IADL's independently Patient will call provider office for new concerns or questions  Please see education materials related to today's visit provided by MyChart link.  Patient will review today's visit and information via My Chart Link  The Managed Medicaid care management team will reach out to the patient again over the next 30 days.   Salvatore Marvel RN, BSN Community Care Coordinator [place] [facility] Mobile: [phone]   Following is a copy of your plan of care:  Patient Care Plan: RNCM Plan of Care     Problem Identified: Care Management and Care Coordination Needs for HTN, Hepatitis C and reoccurring hernia issues      Long-Range Goal: RNCM Plan of Care for Care Management and Care Coordination Needs   Start Date: 07/22/2021  Expected End Date: 12/20/2021  Priority: High  Note:   Current Barriers:  Knowledge Deficits related to plan of care for management of HTN and Hepatitis C and reoccurring  hernia issues  Care Coordination needs related to Transportation  Chronic Disease Management support and education needs related to HTN and Hepatitis C and reoccurring hernia issues Transportation barriers No Advanced Directives in place  RNCM Clinical Goal(s):  Patient will verbalize understanding of plan for management of HTN and Hepatitis C and reoccurring hernia issues verbalize basic understanding of HTN and Hepatitis C and reoccurring hernia issues disease process and self health management plan   take all medications exactly as prescribed and will call provider for medication related questions attend all scheduled medical appointments: 09/05/21 Dr Doren Custard (ID) and 09/08/21 GI Doctor [name] demonstrate ongoing adherence to prescribed treatment plan for HTN and Hepatitis C and reoccurring hernia issues as evidenced by adherence to prescribed medication regimen contacting provider for new or worsened symptoms or questions and adhering to low sodium diet   continue to work with RN Care Manager to address care management and care coordination needs related to HTN and Hepatitis C and reoccurring hernia issues  through collaboration with RN Care manager, provider, and care team.   Interventions: Inter-disciplinary care team collaboration (see longitudinal plan of care) Evaluation of current treatment plan related to  self management and patient's adherence to plan as established by provider   Hepatitis C and Reoccurring Hernia Issues  (Status: New goal.) Evaluation of current treatment plan related to  Hepatitis C and reoccurring hernia issues.  ,  Transportation needs addressed and self-management and patient's adherence to plan as established by provider. Discussed plans with patient for ongoing care management follow up and provided patient with direct contact information for care management team Advised patient to report any changes in hernia to PCP; Provided education to patient  re: pain management for any hernia pain including wearing hernia belt or supportive underwear/clothing to support abdominal area. Reviewed medications with patient and discussed importance of adhering to medication regimen. Reviewed scheduled/upcoming provider appointments including 09/05/21 Dr Doren Custard (ID).  Hypertension: (Status: New goal.) Last practice recorded BP readings:  BP Readings from Last 3 Encounters:  08/13/21 120/80  07/23/21 132/61  07/18/21 (!) 124/50    Most recent eGFR/CrCl: No results found for: EGFR  No components found for: CRCL  Evaluation of current treatment plan related to hypertension self management and patient's adherence to plan as established by provider.   Reviewed prescribed diet : low sodium diet.  Patient continues to adhere to not adding any salt to food or cooking. Reviewed medications with patient and discussed importance of compliance;  Provided assistance with obtaining home blood pressure monitor via free medicaid plan but patient refused and feels  she does not need one at this time;  Discussed plans with patient for ongoing care management follow up and provided patient with direct contact information for care management team; Reviewed scheduled/upcoming provider appointments including: Dr Doren Custard on 09/05/2021 and [name] (GI) on 09/08/21 Continued education on prescribed diet low sodium diet Assessed social determinant of health barriers.  Patient Goals/Self-Care Activities: Patient will self administer medications as prescribed Patient will attend all scheduled provider appointments Patient will call pharmacy for medication refills Patient will continue to perform ADL's independently Patient will continue to perform IADL's independently Patient will call provider office for new concerns or questions

## 2021-09-03 ENCOUNTER — Other Ambulatory Visit (HOSPITAL_COMMUNITY): Payer: Self-pay

## 2021-09-03 ENCOUNTER — Other Ambulatory Visit: Payer: Self-pay

## 2021-09-03 DIAGNOSIS — K420 Umbilical hernia with obstruction, without gangrene: Secondary | ICD-10-CM

## 2021-09-03 DIAGNOSIS — B182 Chronic viral hepatitis C: Secondary | ICD-10-CM

## 2021-09-04 ENCOUNTER — Telehealth: Payer: Self-pay | Admitting: *Deleted

## 2021-09-04 NOTE — Telephone Encounter (Signed)
   Telephone encounter was:  Unsuccessful.  09/04/2021 Name: Samantha Evans MRN: [id number] DOB: 1961-08-01  Unsuccessful outbound call made today to assist with:  Transportation Needs patient has been set up for 2 transportaion appts through [place] and this mail box is full and can not accept messages at this time  Outreach Attempt:  1st Attempt patient has been set up for 2 transportaion appts through [place] and this mail box is full and can not accept messages at this time [place], Care Management  [phone] [address] , [place] [address] Email : [email or web address]

## 2021-09-04 NOTE — Telephone Encounter (Signed)
   Telephone encounter was:  Unsuccessful.  09/04/2021 Name: Samantha Evans MRN: [id number] DOB: [birth year]  Unsuccessful outbound call made today to assist with:  Transportation Needs   Outreach Attempt:  2nd Attempt Tryng again to reach patient as appointment but still.no answer voicemail is full  [place], Care Management  [phone] [address] , [place] [address] Email : [email or web address]

## 2021-09-04 NOTE — Telephone Encounter (Signed)
   Telephone encounter was:  Unsuccessful.  09/04/2021 Name: Samantha Evans MRN: [id number] DOB: 06-Feb-1961  Unsuccessful outbound call made today to assist with:  Transportation Needs   Outreach Attempt:  3rd Attempt.  Referral closed unable to contact patient.  A HIPAA compliant voice message was left requesting a return call.  Instructed patient to call back at   Instructed patient to call back at [phone]  at their earliest convenience. .  [place], Care Management  [phone] [address] , [place] [address] Email : [email or web address]

## 2021-09-05 ENCOUNTER — Ambulatory Visit: Payer: Medicaid Other | Admitting: Infectious Diseases

## 2021-09-05 ENCOUNTER — Other Ambulatory Visit (HOSPITAL_COMMUNITY): Payer: Self-pay

## 2021-09-08 ENCOUNTER — Other Ambulatory Visit (INDEPENDENT_AMBULATORY_CARE_PROVIDER_SITE_OTHER): Payer: Medicaid Other

## 2021-09-08 ENCOUNTER — Ambulatory Visit (INDEPENDENT_AMBULATORY_CARE_PROVIDER_SITE_OTHER): Payer: Medicaid Other | Admitting: Gastroenterology

## 2021-09-08 ENCOUNTER — Encounter: Payer: Self-pay | Admitting: Gastroenterology

## 2021-09-08 VITALS — BP 134/60 | HR 68 | Ht 60.0 in | Wt 183.6 lb

## 2021-09-08 DIAGNOSIS — K766 Portal hypertension: Secondary | ICD-10-CM

## 2021-09-08 DIAGNOSIS — R932 Abnormal findings on diagnostic imaging of liver and biliary tract: Secondary | ICD-10-CM

## 2021-09-08 DIAGNOSIS — K7460 Unspecified cirrhosis of liver: Secondary | ICD-10-CM

## 2021-09-08 LAB — CBC WITH DIFFERENTIAL/PLATELET
Basophils Absolute: 0 10*3/uL (ref 0.0–0.1)
Basophils Relative: 0.8 % (ref 0.0–3.0)
Eosinophils Absolute: 0.2 10*3/uL (ref 0.0–0.7)
Eosinophils Relative: 4.2 % (ref 0.0–5.0)
HCT: 30.6 % — ABNORMAL LOW (ref 36.0–46.0)
Hemoglobin: 10.4 g/dL — ABNORMAL LOW (ref 12.0–15.0)
Lymphocytes Relative: 27.2 % (ref 12.0–46.0)
Lymphs Abs: 1.1 10*3/uL (ref 0.7–4.0)
MCHC: 34.1 g/dL (ref 30.0–36.0)
MCV: 90.8 fl (ref 78.0–100.0)
Monocytes Absolute: 0.5 10*3/uL (ref 0.1–1.0)
Monocytes Relative: 11.3 % (ref 3.0–12.0)
Neutro Abs: 2.3 10*3/uL (ref 1.4–7.7)
Neutrophils Relative %: 56.5 % (ref 43.0–77.0)
Platelets: 113 10*3/uL — ABNORMAL LOW (ref 150.0–400.0)
RBC: 3.36 Mil/uL — ABNORMAL LOW (ref 3.87–5.11)
RDW: 15.3 % (ref 11.5–15.5)
WBC: 4.1 10*3/uL (ref 4.0–10.5)

## 2021-09-08 LAB — PROTIME-INR
INR: 1.1 ratio — ABNORMAL HIGH (ref 0.8–1.0)
Prothrombin Time: 12.2 s (ref 9.6–13.1)

## 2021-09-08 LAB — COMPREHENSIVE METABOLIC PANEL
ALT: 17 U/L (ref 0–35)
AST: 31 U/L (ref 0–37)
Albumin: 3.6 g/dL (ref 3.5–5.2)
Alkaline Phosphatase: 115 U/L (ref 39–117)
BUN: 25 mg/dL — ABNORMAL HIGH (ref 6–23)
CO2: 25 mEq/L (ref 19–32)
Calcium: 10.8 mg/dL — ABNORMAL HIGH (ref 8.4–10.5)
Chloride: 109 mEq/L (ref 96–112)
Creatinine, Ser: 0.79 mg/dL (ref 0.40–1.20)
GFR: 81.14 mL/min (ref >60.00)
Glucose, Bld: 70 mg/dL (ref 70–99)
Potassium: 4.3 mEq/L (ref 3.5–5.1)
Sodium: 140 mEq/L (ref 135–145)
Total Bilirubin: 1 mg/dL (ref 0.2–1.2)
Total Protein: 7.4 g/dL (ref 6.0–8.3)

## 2021-09-08 MED ORDER — SPIRONOLACTONE 50 MG PO TABS: 50.0000 mg | ORAL_TABLET | Freq: Every day | ORAL | 5 refills | Status: DC

## 2021-09-08 NOTE — Progress Notes (Signed)
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 ID clinic Abnormal liver imaging    - CT 4/22: 2.1 cm density in the right hepatic lobe    - MRI 02/27/21 - likely perfusion anomaly, repeat in 6 months recommended Mildly elevated AFP 12 03/06/21 LE edema on diuretics, no history of ascites by exam or imaging Small esophageal varices and portal hypertensive gastropathy on EGD 06/11/21 Mild coagulopathy with INR 1,2 Prior cholecystectomy Diverticulosis Incarcerated umbilical hernia repair 06/11/19 History of colon polyps    - Surveillance colonoscopy recommended 1 year    - concern that large advanced polyp was not completely resected  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 Margarita Rana - Follow-up with ID to complete HCV treatment - Referral to consider liver transplant with evidence for disease progression/decompensation - Surveillance colonoscopy in 1 year   Please see the "Patient Instructions" section for addition details about the plan.  HPI: Samantha Evans is a 60 y.o. female who returns in follow-up. The interval history is obtained through the patient and review of her electronic health record. She has cirrhosis due to HCV under treatment through the [facility]. Recent labs show a MELD score of 11. Umbilical hernia repair 3/55/97. CT at that time showed cirrhosis and a 2.1cm density in the right hepatic lobe, trace ascites, and splenomegaly. Follow-up MRI showed no HCC but there was a perfusion  anomaly. Repeat recommended in 6 months. She was to have this MRI performed prior to her follow-up visit today, but, she has had difficulty arranging transportation and not yet had the study done. Must frustrated because this has prevented her from having her cataracts removed.   She had no new complaints or concerns at this time.   Endoscopic history: - EGD 05/20/21: 3 colums of grade 1 esophageal varices, severe portal hypertensive gastropathy - Screening colonoscopy 06/11/21: One 15 mm polyp in the sigmoid colon with high grade dysplasia and unclear margins. One 3 mm tubular adenoma in the sigmoid colon. 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 Path results not available.  - Colonoscopy 07/23/32: Tubular adenoma at 40 cm, inflammatory polyp at 28 cm, detached necroinflammatory debris at 16 cm   Past Medical History:  Diagnosis Date   Arthritis    Carpal tunnel syndrome    Cataracts, bilateral    Cirrhosis ([place])    Hypertension     Past Surgical History:  Procedure Laterality Date   ERCP     UMBILICAL HERNIA REPAIR N/A 02/27/2021   Procedure: REPAIR INCARCERATED UMBILICAL HERNIA;  Surgeon: Georganna Skeans, MD;  Location: [place];  Service: General;  Laterality: N/A;    Current Outpatient Medications  Medication Sig Dispense Refill   amLODipine (NORVASC) 10 MG tablet Take 1 tablet (10 mg total) by mouth daily. 30 tablet 6   psyllium (METAMUCIL SMOOTH TEXTURE) 58.6 % powder Take 1 packet by mouth daily. (Patient taking differently: Take 1 packet by mouth daily as needed.) 283 g 12   Sofosbuvir-Velpatasvir (EPCLUSA) 400-100 MG  TABS Take 1 tablet by mouth daily. 28 tablet 5   spironolactone (ALDACTONE) 50 MG tablet Take 1 tablet (50 mg total) by mouth daily. 30 tablet 5   No current facility-administered medications for this visit.    Allergies as of 09/08/2021   (No Known Allergies)     Physical Exam: General:   Alert, in NAD. No scleral icterus. No bilateral temporal wasting.  Heart:   Regular rate and rhythm; no murmurs Pulm: Clear anteriorly; no wheezing Abdomen:  Soft. Nontender. Nondistended. Normal bowel sounds. No rebound or guarding. No fluid wave.  LAD: No inguinal or umbilical LAD Extremities:  1-2+ LE edema. Neurologic:  Alert and  oriented x4;  grossly normal neurologically; no asterixis or clonus. Skin: No jaundice. Spider angioma on the chest wall. No palmar erythema.  Psych:  Alert and cooperative. Normal mood and affect.    Wilda Wetherell L. Tarri Glenn, MD, MPH 09/08/2021, 9:22 PM

## 2021-09-08 NOTE — Patient Instructions (Addendum)
Review of Epic shows that Ms. Samantha Evans, Care Guide , [place], Care Management, at phone number [phone] has been trying to reach you. Please call her.  I recommend that we reschedule your MRI.  Please go to the lab today to have your liver tests and calcium levels repeated.   Follow-up in the office in 3-4 months, earlier as needed.   You will be contacted by [facility] in the next 2 days to arrange a MRI Liver.  The number on your caller ID will be [phone], please answer when they call.  If you have not heard from them in 2 days please call [phone] to schedule.     LABS:   Please proceed to the basement level for lab work before leaving today. Press "B" on the elevator. The lab is located at the first door on the left as you exit the elevator.  HEALTHCARE LAWS AND MY CHART RESULTS:   Due to recent changes in healthcare laws, you may see results of your imaging and/or laboratory studies on MyChart before I have had a chance to review them.  I understand that in some cases there may be results that are confusing or concerning to you. Please understand that not all results are received at same time and often I may need to interpret multiple results in order to provide you with the best plan of care or course of treatment. Therefore, I ask that you please give me 48 hours to thoroughly review all your results before contacting my office for clarification.   It was my pleasure to provide care to you today. Based on our discussion, I am providing you with my recommendations below:  RECOMMENDATION(S):    FOLLOW UP:  I would like for you to follow up with me in 3-4 months. Please call the office at [phone] to schedule your appointment.    BMI:  If you are age 60 or older, your body mass index should be between 23-30. Your Body mass index is 35.86 kg/m. If this is out of the aforementioned range listed,  please consider follow up with your Primary Care Provider.  If you are age 60 or younger, your body mass index should be between 19-25. Your Body mass index is 35.86 kg/m. If this is out of the aformentioned range listed, please consider follow up with your Primary Care Provider.   MY CHART:  The [place] providers would like to encourage you to use [facility] to communicate with providers for non-urgent requests or questions.  Due to long hold times on the telephone, sending your provider a message by [facility] may be a faster and more efficient way to get a response.  Please allow 48 business hours for a response.  Please remember that this is for non-urgent requests.   Thank you for trusting me with your gastrointestinal care!    [name], MD, MPH

## 2021-09-09 ENCOUNTER — Other Ambulatory Visit: Payer: Self-pay

## 2021-09-09 DIAGNOSIS — G4489 Other headache syndrome: Secondary | ICD-10-CM

## 2021-09-09 DIAGNOSIS — K7460 Unspecified cirrhosis of liver: Secondary | ICD-10-CM

## 2021-09-09 DIAGNOSIS — R5382 Chronic fatigue, unspecified: Secondary | ICD-10-CM

## 2021-09-09 LAB — AFP TUMOR MARKER: AFP-Tumor Marker: 8.5 ng/mL — ABNORMAL HIGH

## 2021-09-16 DIAGNOSIS — Z419 Encounter for procedure for purposes other than remedying health state, unspecified: Secondary | ICD-10-CM | POA: Diagnosis not present

## 2021-09-24 ENCOUNTER — Other Ambulatory Visit: Payer: Self-pay

## 2021-09-24 NOTE — Progress Notes (Signed)
error 

## 2021-09-26 ENCOUNTER — Ambulatory Visit (HOSPITAL_COMMUNITY): Payer: Medicaid Other

## 2021-09-30 ENCOUNTER — Other Ambulatory Visit (HOSPITAL_COMMUNITY): Payer: Self-pay

## 2021-09-30 ENCOUNTER — Other Ambulatory Visit: Payer: Self-pay

## 2021-09-30 ENCOUNTER — Telehealth: Payer: Self-pay | Admitting: Pharmacist

## 2021-09-30 NOTE — Patient Outreach (Signed)
Medicaid Managed Care   Nurse Care Manager Note  09/30/2021 Name:  Samantha Evans MRN:  [id number] DOB:  [birth year]  Samantha Evans is an 60 y.o. year old female who is a primary patient of [name], MD.  The [facility] Managed Care Coordination team was consulted for assistance with:    HTN Hepatitis C  and reoccurring hernia issues  [name] was given information about Medicaid Managed Care Coordination team services today. [place] Patient agreed to services and verbal consent obtained.  Engaged with patient by telephone for follow up visit in response to provider referral for case management and/or care coordination services.   Assessments/Interventions:  Review of past medical history, allergies, medications, health status, including review of consultants reports, laboratory and other test data, was performed as part of comprehensive evaluation and provision of chronic care management services.  SDOH (Social Determinants of Health) assessments and interventions performed:   Care Plan  No Known Allergies  Medications Reviewed Today     Reviewed by Inge Rise, RN (Case Manager) on 09/30/21 at [place] [facility] Status: <None>   Medication Order Taking? Sig Documenting Provider Last Dose Status Informant  amLODipine (NORVASC) 10 MG tablet [id number] No Take 1 tablet (10 mg total) by mouth daily.  Patient not taking: Reported on 09/30/2021   [name], MD Not Taking Active   psyllium (METAMUCIL SMOOTH TEXTURE) 58.6 % powder [id number]  Take 1 packet by mouth daily.  Patient taking differently: Take 1 packet by mouth daily as needed.   [name], MD  Active   Sofosbuvir-Velpatasvir [facility]) 400-100 MG TABS [id number] Yes Take 1 tablet by mouth daily. Kuppelweiser, [name], RPH-CPP Taking Active   spironolactone (ALDACTONE) 50 MG tablet [id number] Yes Take 1 tablet (50 mg total) by mouth daily. [name], MD Taking Active              Patient Active Problem List   Diagnosis Date Noted   Peripheral edema 05/12/2021   Chronic hepatitis C without hepatic coma ([place]) 03/25/2021   Acid reflux 03/25/2021   Incarcerated umbilical hernia 74/25/9563   Small bowel obstruction ([place]) 02/27/2021   Hepatic cirrhosis ([place])- by CT scan 02/27/2021   AKI (acute kidney injury) ([place]) 02/27/2021    Conditions to be addressed/monitored per PCP order:  HTN and Hepatitis C and reoccurring hernia issues  Care Plan : [facility] Plan of Care  Updates made by Inge Rise, RN since 09/30/2021 12:00 AM     Problem: Care Management and Care Coordination Needs for HTN, Hepatitis C and reoccurring hernia issues      Long-Range Goal: RNCM Plan of Care for Care Management and Care Coordination Needs (HTN, Hepatitis C, Reoccuring Hernia Issues)   Start Date: 07/22/2021  Expected End Date: 12/20/2021  Priority: High  Note:   Current Barriers:  Knowledge Deficits related to plan of care for management of HTN and Hepatitis C and reoccurring hernia issues  Care Coordination needs related to Limited social support, Transportation, Medication procurement, Social Isolation, and Limited access to caregiver  Chronic Disease Management support and education needs related to HTN and Hepatitis C and reoccurring hernia issues Lacks caregiver support.        Non-adherence to scheduled provider appointments Non-adherence to prescribed medication regimen Difficulty obtaining medications No Advanced Directives in place Transportation Barrier being addressed by [name]   RNCM Clinical Goal(s):  Patient will verbalize understanding of plan for management of HTN and Hepatitis C and  reoccurring hernia issues as evidenced by patient verbalization of understanding of HTN, Hepatitis C and hernia issues and when to report changes to PCP. verbalize basic understanding of HTN and Hepatitis C and reoccurring hernia issues disease process and self health  management plan as evidenced by improved control of diseases. take all medications exactly as prescribed and will call provider for medication related questions as evidenced by improved control of diseases    attend all scheduled medical appointments: MRI of Liver on 10/17/21 as evidenced by MRI of Liver on 10/17/21        demonstrate improved and ongoing adherence to prescribed treatment plan for HTN and Hepatitis C and reoccurring hernia issues as evidenced by improved control of diseases. continue to work with Consulting civil engineer and/or Social Worker to address care management and care coordination needs related to HTN and Hepatitis C and reoccurring hernia issues as evidenced by adherence to CM Team Scheduled appointments     through collaboration with Consulting civil engineer, provider, and care team.   Interventions: Inter-disciplinary care team collaboration (see longitudinal plan of care) Evaluation of current treatment plan related to  self management and patient's adherence to plan as established by provider   Hepatitis C and Reoccurring Hernia Issues  (Status: Goal on Track (progressing): YES.) Evaluation of current treatment plan related to  Hepatitis C and reoccurring hernia issues.  ,  Transportation needs addressed and self-management and patient's adherence to plan as established by provider. Discussed plans with patient for ongoing care management follow up and provided patient with direct contact information for care management team Advised patient to report any changes in hernia to PCP; Provided education to patient re: pain management for any hernia pain including wearing hernia belt or supportive underwear/clothing to support abdominal area. Reviewed medications with patient and discussed importance of adhering to medication regimen. Reviewed scheduled/upcoming provider appointments including 10/17/2021 for MRI of Liver.  Hypertension: (Status: Goal on Track (progressing): YES.) Last  practice recorded BP readings:  BP Readings from Last 3 Encounters:  08/13/21 120/80  07/23/21 132/61  07/18/21 (!) 124/50    Most recent eGFR/CrCl: No results found for: EGFR  No components found for: CRCL  Evaluation of current treatment plan related to hypertension self management and patient's adherence to plan as established by provider.   Reviewed prescribed diet : low sodium diet.  Patient continues to adhere to not adding any salt to food or cooking. Reviewed medications with patient and discussed importance of compliance; Patient reports she is out of 2 medications: Spirolactone and Norvasc.  This RNCM will contact pharmacy and see if medications can be delivered to patient.  Patient has no transportation or person to pick up medications. Provided assistance with obtaining home blood pressure monitor via free medicaid plan but patient refused and feels she does not need one at this time;  Discussed plans with patient for ongoing care management follow up and provided patient with direct contact information for care management team; Reviewed scheduled/upcoming provider appointments including 10/17/2021 MRI of Liver Continued education on prescribed diet low sodium diet.  Patient states she does cooking herself and does not use any salt on food.  We discussed the importance of reading food labels for salt content. Assessed social determinant of health barriers.  Patient Goals/Self-Care Activities: Patient will self administer medications as prescribed as evidenced by self report/primary caregiver report  Patient will attend all scheduled provider appointments as evidenced by clinician review of documented attendance to scheduled appointments and  patient/caregiver report Patient will call pharmacy for medication refills as evidenced by patient report and review of pharmacy fill history as appropriate Patient will continue to perform ADL's independently as evidenced by patient/caregiver  report Patient will continue to perform IADL's independently as evidenced by patient/caregiver report Patient will call provider office for new concerns or questions as evidenced by review of documented incoming telephone call notes and patient report - learn about high blood pressure - keep all doctor appointments - take medications for blood pressure exactly as prescribed - report new symptoms to your doctor - limit salt intake to 2000 mg/day       Follow Up:  Patient agrees to Care Plan and Follow-up.  Plan: The Managed Medicaid care management team will reach out to the patient again over the next 30 days.  Date/time of next scheduled RN care management/care coordination outreach:  10/28/2021 at 10:00 AM  [place], [place] [facility] Mobile: [phone]

## 2021-09-30 NOTE — Telephone Encounter (Signed)
Spoke to patient today per [facility] [place] system. She is due for an HCV follow-up with [facility] [place] as she has been taking [place] since July for 6 months due to her [facility]. Her RNA was 954,000 in April and then undetectable in July. Needs HCV RNA checked soon. She will follow-up with me on 12/9.  Alfonse Spruce, PharmD, CPP Clinical Pharmacist Practitioner [facility] [place] [facility]

## 2021-09-30 NOTE — Patient Instructions (Signed)
Visit Information  Samantha Evans was given information about Medicaid Managed Care team care coordination services as a part of their [facility] Medicaid benefit. Maurene Hollin Killough verbally consented to engagement with the [facility] Managed Care team.   If you are experiencing a medical emergency, please call 911 or report to your local emergency department or urgent care.   If you have a non-emergency medical problem during routine business hours, please contact your provider's office and ask to speak with a nurse.   For questions related to your [facility] health plan, please call: [phone] or go here:[email or web address]  If you would like to schedule transportation through your [facility] plan, please call the following number at least 2 days in advance of your appointment: [phone].  Call the [place] at [phone], at any time, 24 hours a day, 7 days a week. If you are in danger or need immediate medical attention call 911.  If you would like help to quit smoking, call 1-800-QUIT-NOW 249-794-0588) OR Espaol: 1-855-Djelo-Ya (0-258-527-7824) o para ms informacin haga clic aqu or Text READY to 200-400 to register via text  Ms. Rehman - following are the goals we discussed in your visit today:   Goals Addressed             This Visit's Progress    RNCM - Hypertension Monitored & Managed       Timeframe:  Long-Range Goal Priority:  High Start Date:  07/22/2021                           Expected End Date:  12/20/2021     Patient Goals: Patient will self administer medications as prescribed Patient will attend all scheduled provider appointments Patient will call pharmacy for medication refills Patient will continue to perform ADL's independently Patient will continue to perform IADL's independently Patient will call provider office for new concerns or questions   - learn about high blood pressure - keep all doctor appointments -  take medications for blood pressure exactly as prescribed - report new symptoms to your doctor - limit salt intake to 2000 mg/day               Please see education materials related to today's visit provided by MyChart link.  The patient will review today's visit information via My Chart link.  The Managed Medicaid care management team will reach out to the patient again over the next 30 days.   Salvatore Marvel RN, BSN Community Care Coordinator [place] [facility] Mobile: [phone]   Following is a copy of your plan of care:  Care Plan : [facility] Plan of Care  Updates made by Inge Rise, RN since 09/30/2021 12:00 AM     Problem: Care Management and Care Coordination Needs for HTN, Hepatitis C and reoccurring hernia issues      Long-Range Goal: RNCM Plan of Care for Care Management and Care Coordination Needs (HTN, Hepatitis C, Reoccuring Hernia Issues)   Start Date: 07/22/2021  Expected End Date: 12/20/2021  Priority: High  Note:   Current Barriers:  Knowledge Deficits related to plan of care for management of HTN and Hepatitis C and reoccurring hernia issues  Care Coordination needs related to Limited social support, Transportation, Medication procurement, Social Isolation, and Limited access to caregiver  Chronic Disease Management support and education needs related to HTN and Hepatitis C and reoccurring hernia issues Lacks caregiver support.  Non-adherence to scheduled provider appointments Non-adherence to prescribed medication regimen Difficulty obtaining medications No Advanced Directives in place Transportation Barrier being addressed by [name]   RNCM Clinical Goal(s):  Patient will verbalize understanding of plan for management of HTN and Hepatitis C and reoccurring hernia issues as evidenced by patient verbalization of understanding of HTN, Hepatitis C and hernia issues and when to report changes to PCP. verbalize  basic understanding of HTN and Hepatitis C and reoccurring hernia issues disease process and self health management plan as evidenced by improved control of diseases. take all medications exactly as prescribed and will call provider for medication related questions as evidenced by improved control of diseases    attend all scheduled medical appointments: MRI of Liver on 10/17/21 as evidenced by MRI of Liver on 10/17/21        demonstrate improved and ongoing adherence to prescribed treatment plan for HTN and Hepatitis C and reoccurring hernia issues as evidenced by improved control of diseases. continue to work with Consulting civil engineer and/or Social Worker to address care management and care coordination needs related to HTN and Hepatitis C and reoccurring hernia issues as evidenced by adherence to CM Team Scheduled appointments     through collaboration with Consulting civil engineer, provider, and care team.   Interventions: Inter-disciplinary care team collaboration (see longitudinal plan of care) Evaluation of current treatment plan related to  self management and patient's adherence to plan as established by provider   Hepatitis C and Reoccurring Hernia Issues  (Status: Goal on Track (progressing): YES.) Evaluation of current treatment plan related to  Hepatitis C and reoccurring hernia issues.  ,  Transportation needs addressed and self-management and patient's adherence to plan as established by provider. Discussed plans with patient for ongoing care management follow up and provided patient with direct contact information for care management team Advised patient to report any changes in hernia to PCP; Provided education to patient re: pain management for any hernia pain including wearing hernia belt or supportive underwear/clothing to support abdominal area. Reviewed medications with patient and discussed importance of adhering to medication regimen. Reviewed scheduled/upcoming provider appointments  including 10/17/2021 for MRI of Liver.  Hypertension: (Status: Goal on Track (progressing): YES.) Last practice recorded BP readings:  BP Readings from Last 3 Encounters:  08/13/21 120/80  07/23/21 132/61  07/18/21 (!) 124/50    Most recent eGFR/CrCl: No results found for: EGFR  No components found for: CRCL  Evaluation of current treatment plan related to hypertension self management and patient's adherence to plan as established by provider.   Reviewed prescribed diet : low sodium diet.  Patient continues to adhere to not adding any salt to food or cooking. Reviewed medications with patient and discussed importance of compliance; Patient reports she is out of 2 medications: Spirolactone and Norvasc.  This RNCM will contact pharmacy and see if medications can be delivered to patient.  Patient has no transportation or person to pick up medications. Provided assistance with obtaining home blood pressure monitor via free medicaid plan but patient refused and feels she does not need one at this time;  Discussed plans with patient for ongoing care management follow up and provided patient with direct contact information for care management team; Reviewed scheduled/upcoming provider appointments including 10/17/2021 MRI of Liver Continued education on prescribed diet low sodium diet.  Patient states she does cooking herself and does not use any salt on food.  We discussed the importance of reading food labels for salt  content. Assessed social determinant of health barriers.  Patient Goals/Self-Care Activities: Patient will self administer medications as prescribed as evidenced by self report/primary caregiver report  Patient will attend all scheduled provider appointments as evidenced by clinician review of documented attendance to scheduled appointments and patient/caregiver report Patient will call pharmacy for medication refills as evidenced by patient report and review of pharmacy fill history as  appropriate Patient will continue to perform ADL's independently as evidenced by patient/caregiver report Patient will continue to perform IADL's independently as evidenced by patient/caregiver report Patient will call provider office for new concerns or questions as evidenced by review of documented incoming telephone call notes and patient report - learn about high blood pressure - keep all doctor appointments - take medications for blood pressure exactly as prescribed - report new symptoms to your doctor - limit salt intake to 2000 mg/day

## 2021-10-16 DIAGNOSIS — Z419 Encounter for procedure for purposes other than remedying health state, unspecified: Secondary | ICD-10-CM | POA: Diagnosis not present

## 2021-10-17 ENCOUNTER — Ambulatory Visit: Payer: Medicaid Other

## 2021-10-24 ENCOUNTER — Ambulatory Visit: Payer: Medicaid Other | Admitting: Pharmacist

## 2021-10-28 ENCOUNTER — Other Ambulatory Visit: Payer: Self-pay

## 2021-10-28 NOTE — Patient Instructions (Signed)
Visit Information  Samantha Evans was given information about Medicaid Managed Care team care coordination services as a part of their [facility] Medicaid benefit. Samantha Evans verbally consented to engagement with the [facility] Managed Care team.   If you are experiencing a medical emergency, please call 911 or report to your local emergency department or urgent care.   If you have a non-emergency medical problem during routine business hours, please contact your provider's office and ask to speak with a nurse.   For questions related to your [facility] health plan, please call: [phone] or go here:[email or web address]  If you would like to schedule transportation through your [facility] plan, please call the following number at least 2 days in advance of your appointment: [phone].  Call the [place] at [phone], at any time, 24 hours a day, 7 days a week. If you are in danger or need immediate medical attention call 911.  If you would like help to quit smoking, call 1-800-QUIT-NOW (660) 021-9531) OR Espaol: 1-855-Djelo-Ya (2-919-166-0600) o para ms informacin haga clic aqu or Text READY to 200-400 to register via text  Ms. Mangiapane - following are the goals we discussed in your visit today:   Goals Addressed   None     Please see education materials related to today's visit provided by MyChart link.  The patient verbalized understanding information from today's visit and will review information via My Chart link.  The Managed Medicaid care management team will reach out to the patient again over the next 30 days.   Salvatore Marvel RN, BSN Community Care Coordinator [facility] [facility] Mobile: [phone]   Following is a copy of your plan of care:  Care Plan : [facility] Plan of Care  Updates made by Inge Rise, RN since 10/28/2021 12:00 AM     Problem: Care Management and Care Coordination Needs  for HTN, Hepatitis C and reoccurring hernia issues      Long-Range Goal: RNCM Plan of Care for Care Management and Care Coordination Needs (HTN, Hepatitis C, Reoccuring Hernia Issues)   Start Date: 07/22/2021  Expected End Date: 12/20/2021  Priority: High  Note:   Current Barriers:  Knowledge Deficits related to plan of care for management of HTN and Hepatitis C and reoccurring hernia issues  Care Coordination needs related to Limited social support, Transportation, Medication procurement, Social Isolation, and Limited access to caregiver  Chronic Disease Management support and education needs related to HTN and Hepatitis C and reoccurring hernia issues Lacks caregiver support.        Non-adherence to scheduled provider appointments Non-adherence to prescribed medication regimen Difficulty obtaining medications No Advanced Directives in place Transportation Barrier being addressed by [name]   RNCM Clinical Goal(s):  Patient will verbalize understanding of plan for management of HTN and Hepatitis C and reoccurring hernia issues as evidenced by patient verbalization of understanding of HTN, Hepatitis C and hernia issues and when to report changes to PCP. verbalize basic understanding of HTN and Hepatitis C and reoccurring hernia issues disease process and self health management plan as evidenced by improved control of diseases. take all medications exactly as prescribed and will call provider for medication related questions as evidenced by improved control of diseases    attend all scheduled medical appointments: Missed MRI of Liver on 10/17/21 due to appointment being canceled per patient will be rescheduled as evidenced by completing MRI of Liver in the near future  demonstrate improved and ongoing adherence to prescribed treatment plan for HTN and Hepatitis C and reoccurring hernia issues as evidenced by improved control of diseases. continue to work with Consulting civil engineer  and/or Social Worker to address care management and care coordination needs related to HTN and Hepatitis C and reoccurring hernia issues as evidenced by adherence to CM Team Scheduled appointments     through collaboration with Consulting civil engineer, provider, and care team.   Interventions: Inter-disciplinary care team collaboration (see longitudinal plan of care) Evaluation of current treatment plan related to  self management and patient's adherence to plan as established by provider   Hepatitis C and Reoccurring Hernia Issues  (Status: Goal on Track (progressing): YES.) Evaluation of current treatment plan related to  Hepatitis C and reoccurring hernia issues. Patient reports she has completed Hep C medication treatment and is no longing taking Epdusa.  Scheduled 10/17/2021 MRI of Liver was canceled on same day per patient  This RN contacted [name] office to learn why MRI was canceled.  I learned there was a miscommunication regarding MRI needing pre-authorization which did not occur.  MRI will be rescheduled in near future.  Patient continues to report pain around hernia area. Rates pain as a "7" on a Pain Scale of 0 - 10.  Patient states she is "so bloated at times".  Patient is taking Ibuprofrn 200 mg tabs 2 tabs a couple of times per day PRN with some pain relief. ,  Transportation needs addressed and self-management and patient's adherence to plan as established by provider. Discussed plans with patient for ongoing care management follow up and provided patient with direct contact information for care management team Advised patient to report any changes in hernia to PCP; Continue to provide education to patient re: pain management for any hernia pain including PRN pain medication use, wearing hernia belt or supportive underwear/clothing to support abdominal area. Reviewed medications with patient and discussed importance of adhering to medication regimen. Reviewed scheduled/upcoming provider  appointments including rescheduled future MRI of Liver.  Hypertension: (Status: Goal on Track (progressing): YES.) Last practice recorded BP readings:   BP Readings from Last 3 Encounters:  09/08/21 134/60  08/13/21 120/80  07/23/21 132/61    Most recent eGFR/CrCl: No results found for: EGFR  No components found for: CRCL  Evaluation of current treatment plan related to hypertension self management and patient's adherence to plan as established by provider.   Reviewed prescribed diet : low sodium diet.  Patient continues to adhere to not adding any salt to food or cooking. Reviewed medications with patient and discussed importance of compliance; Patient reports she is only taking Spironolactone at this time. Provided assistance with obtaining home blood pressure monitor via free medicaid plan but patient refused and feels she does not need one at this time;  Discussed plans with patient for ongoing care management follow up and provided patient with direct contact information for care management team; Reviewed scheduled/upcoming provider appointments including rescheduled future MRI of Liver Continued education on prescribed diet low sodium diet.  Patient states she does cooking herself and does not use any salt on food.  We discussed the importance of reading food labels for salt content. Assessed social determinant of health barriers.  Patient Goals/Self-Care Activities: Patient will self administer medications as prescribed as evidenced by self report/primary caregiver report  Patient will attend all scheduled provider appointments as evidenced by clinician review of documented attendance to scheduled appointments and patient/caregiver report Patient will  call pharmacy for medication refills as evidenced by patient report and review of pharmacy fill history as appropriate Patient will continue to perform ADL's independently as evidenced by patient/caregiver report Patient will continue  to perform IADL's independently as evidenced by patient/caregiver report Patient will call provider office for new concerns or questions as evidenced by review of documented incoming telephone call notes and patient report - learn about high blood pressure - keep all doctor appointments - take medications for blood pressure exactly as prescribed - report new symptoms to your doctor - limit salt intake to 2000 mg/day

## 2021-10-28 NOTE — Patient Outreach (Signed)
Medicaid Managed Care   Nurse Care Manager Note  10/28/2021 Name:  [name] MRN:  [id number] DOB:  Aug 05, 1961  Samantha Evans is an 60 y.o. year old female who is a primary patient of Samantha Rakes, MD.  The [facility] Managed Care Coordination team was consulted for assistance with:    HTN Hep C  and Reoccurring Hernia Issues  Ms. Samantha Evans was given information about Medicaid Managed Care Coordination team services today. [name] Patient agreed to services and verbal consent obtained.  Engaged with patient by telephone for follow up visit in response to provider referral for case management and/or care coordination services.   Assessments/Interventions:  Review of past medical history, allergies, medications, health status, including review of consultants reports, laboratory and other test data, was performed as part of comprehensive evaluation and provision of chronic care management services.  SDOH (Social Determinants of Health) assessments and interventions performed:   Care Plan  No Known Allergies  Medications Reviewed Today     Reviewed by Inge Rise, RN (Case Manager) on 10/28/21 at 22  Med List Status: <None>   Medication Order Taking? Sig Documenting Provider Last Dose Status Informant  amLODipine (NORVASC) 10 MG tablet [id number]  Take 1 tablet (10 mg total) by mouth daily.  Patient not taking: Reported on 09/30/2021   Samantha Rakes, MD  Active   psyllium (METAMUCIL SMOOTH TEXTURE) 58.6 % powder [id number]  Take 1 packet by mouth daily.  Patient taking differently: Take 1 packet by mouth daily as needed.   [name], MD  Active   Sofosbuvir-Velpatasvir [facility]) 400-100 MG TABS [id number]  Take 1 tablet by mouth daily. Kuppelweiser, [name], RPH-CPP  Active   spironolactone (ALDACTONE) 50 MG tablet [id number]  Take 1 tablet (50 mg total) by mouth daily. [name], MD  Active             Patient Active Problem List   Diagnosis  Date Noted   Peripheral edema 05/12/2021   Chronic hepatitis C without hepatic coma ([place]) 03/25/2021   Acid reflux 03/25/2021   Incarcerated umbilical hernia 93/81/8299   Small bowel obstruction ([place]) 02/27/2021   Hepatic cirrhosis ([place])- by CT scan 02/27/2021   AKI (acute kidney injury) ([place]) 02/27/2021    Conditions to be addressed/monitored per PCP order:  HTN and Hep C and Reoccurring Hernia Issues  Care Plan : RNCM Plan of Care  Updates made by Inge Rise, RN since 10/28/2021 12:00 AM     Problem: Care Management and Care Coordination Needs for HTN, Hepatitis C and reoccurring hernia issues      Long-Range Goal: RNCM Plan of Care for Care Management and Care Coordination Needs (HTN, Hepatitis C, Reoccuring Hernia Issues)   Start Date: 07/22/2021  Expected End Date: 12/20/2021  Priority: High  Note:   Current Barriers:  Knowledge Deficits related to plan of care for management of HTN and Hepatitis C and reoccurring hernia issues  Care Coordination needs related to Limited social support, Transportation, Medication procurement, Social Isolation, and Limited access to caregiver  Chronic Disease Management support and education needs related to HTN and Hepatitis C and reoccurring hernia issues Lacks caregiver support.        Non-adherence to scheduled provider appointments Non-adherence to prescribed medication regimen Difficulty obtaining medications No Advanced Directives in place Transportation Barrier being addressed by [name]   RNCM Clinical Goal(s):  Patient will verbalize understanding of plan for management of HTN and Hepatitis C and reoccurring  hernia issues as evidenced by patient verbalization of understanding of HTN, Hepatitis C and hernia issues and when to report changes to PCP. verbalize basic understanding of HTN and Hepatitis C and reoccurring hernia issues disease process and self health management plan as evidenced by improved control of  diseases. take all medications exactly as prescribed and will call provider for medication related questions as evidenced by improved control of diseases    attend all scheduled medical appointments: Missed MRI of Liver on 10/17/21 due to appointment being canceled per patient will be rescheduled as evidenced by completing MRI of Liver in the near future        demonstrate improved and ongoing adherence to prescribed treatment plan for HTN and Hepatitis C and reoccurring hernia issues as evidenced by improved control of diseases. continue to work with Consulting civil engineer and/or Social Worker to address care management and care coordination needs related to HTN and Hepatitis C and reoccurring hernia issues as evidenced by adherence to CM Team Scheduled appointments     through collaboration with Consulting civil engineer, provider, and care team.   Interventions: Inter-disciplinary care team collaboration (see longitudinal plan of care) Evaluation of current treatment plan related to  self management and patient's adherence to plan as established by provider   Hepatitis C and Reoccurring Hernia Issues  (Status: Goal on Track (progressing): YES.) Evaluation of current treatment plan related to  Hepatitis C and reoccurring hernia issues. Patient reports she has completed Hep C medication treatment and is no longing taking Epdusa.  Scheduled 10/17/2021 MRI of Liver was canceled on same day per patient  This RN contacted [name] office to learn why MRI was canceled.  I learned there was a miscommunication regarding MRI needing pre-authorization which did not occur.  MRI will be rescheduled in near future.  Patient continues to report pain around hernia area. Rates pain as a "7" on a Pain Scale of 0 - 10.  Patient states she is "so bloated at times".  Patient is taking Ibuprofrn 200 mg tabs 2 tabs a couple of times per day PRN with some pain relief. ,  Transportation needs addressed and self-management and patient's  adherence to plan as established by provider. Discussed plans with patient for ongoing care management follow up and provided patient with direct contact information for care management team Advised patient to report any changes in hernia to PCP; Continue to provide education to patient re: pain management for any hernia pain including PRN pain medication use, wearing hernia belt or supportive underwear/clothing to support abdominal area. Reviewed medications with patient and discussed importance of adhering to medication regimen. Reviewed scheduled/upcoming provider appointments including rescheduled future MRI of Liver.  Hypertension: (Status: Goal on Track (progressing): YES.) Last practice recorded BP readings:   BP Readings from Last 3 Encounters:  09/08/21 134/60  08/13/21 120/80  07/23/21 132/61    Most recent eGFR/CrCl: No results found for: EGFR  No components found for: CRCL  Evaluation of current treatment plan related to hypertension self management and patient's adherence to plan as established by provider.   Reviewed prescribed diet : low sodium diet.  Patient continues to adhere to not adding any salt to food or cooking. Reviewed medications with patient and discussed importance of compliance; Patient reports she is only taking Spironolactone at this time. Provided assistance with obtaining home blood pressure monitor via free medicaid plan but patient refused and feels she does not need one at this time;  Discussed plans  with patient for ongoing care management follow up and provided patient with direct contact information for care management team; Reviewed scheduled/upcoming provider appointments including rescheduled future MRI of Liver Continued education on prescribed diet low sodium diet.  Patient states she does cooking herself and does not use any salt on food.  We discussed the importance of reading food labels for salt content. Assessed social determinant of health  barriers.  Patient Goals/Self-Care Activities: Patient will self administer medications as prescribed as evidenced by self report/primary caregiver report  Patient will attend all scheduled provider appointments as evidenced by clinician review of documented attendance to scheduled appointments and patient/caregiver report Patient will call pharmacy for medication refills as evidenced by patient report and review of pharmacy fill history as appropriate Patient will continue to perform ADL's independently as evidenced by patient/caregiver report Patient will continue to perform IADL's independently as evidenced by patient/caregiver report Patient will call provider office for new concerns or questions as evidenced by review of documented incoming telephone call notes and patient report - learn about high blood pressure - keep all doctor appointments - take medications for blood pressure exactly as prescribed - report new symptoms to your doctor - limit salt intake to 2000 mg/day       Follow Up:  Patient agrees to Care Plan and Follow-up.  Plan: The Managed Medicaid care management team will reach out to the patient again over the next 30 days.  Date/time of next scheduled RN care management/care coordination outreach:  11/25/2021 at 10:30 AM  [place] [place] [facility] Mobile: [phone]

## 2021-11-05 DIAGNOSIS — H25811 Combined forms of age-related cataract, right eye: Secondary | ICD-10-CM | POA: Diagnosis not present

## 2021-11-05 DIAGNOSIS — Z7689 Persons encountering health services in other specified circumstances: Secondary | ICD-10-CM | POA: Diagnosis not present

## 2021-11-12 ENCOUNTER — Other Ambulatory Visit: Payer: Self-pay

## 2021-11-12 ENCOUNTER — Ambulatory Visit (HOSPITAL_COMMUNITY)
Admission: RE | Admit: 2021-11-12 | Discharge: 2021-11-12 | Disposition: A | Discharge status: Home / Self Care | Payer: Medicaid Other | Source: Ambulatory Visit | Attending: Gastroenterology | Admitting: Gastroenterology

## 2021-11-12 DIAGNOSIS — K7689 Other specified diseases of liver: Secondary | ICD-10-CM | POA: Diagnosis not present

## 2021-11-12 DIAGNOSIS — K7460 Unspecified cirrhosis of liver: Secondary | ICD-10-CM | POA: Diagnosis not present

## 2021-11-12 DIAGNOSIS — K7469 Other cirrhosis of liver: Secondary | ICD-10-CM | POA: Diagnosis not present

## 2021-11-12 DIAGNOSIS — K766 Portal hypertension: Secondary | ICD-10-CM | POA: Diagnosis not present

## 2021-11-12 DIAGNOSIS — K862 Cyst of pancreas: Secondary | ICD-10-CM | POA: Diagnosis not present

## 2021-11-12 DIAGNOSIS — R932 Abnormal findings on diagnostic imaging of liver and biliary tract: Secondary | ICD-10-CM | POA: Insufficient documentation

## 2021-11-12 IMAGING — MR MR ABDOMEN WO/W CM
19 series · 48 of 48 positions shown · IV contrast (8 GADAVIST)
Comparison: [DATE] MRI evaluation.

CLINICAL DATA: A 60-year-old female presents for evaluation of
cirrhosis of the liver. Previously found to have hepatic lesions.

EXAM:
MRI ABDOMEN WITHOUT AND WITH CONTRAST
TECHNIQUE: Multiplanar multisequence MR imaging of the abdomen was performed
both before and after the administration of intravenous contrast.
CONTRAST:  8mL GADAVIST GADOBUTROL 1 MMOL/ML IV SOLN

[Series 2: DWI · axial · 6.0mm · 1.46mm/px · z∈[-133,+133]mm · 3 of 76 slices shown (1 of 2)]
[im 1/76]
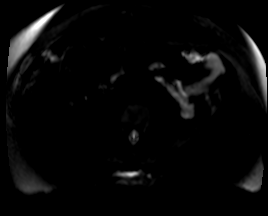
[im 38/76]
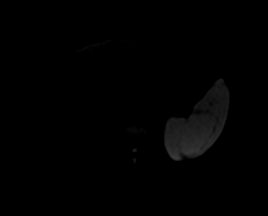
[im 76/76]
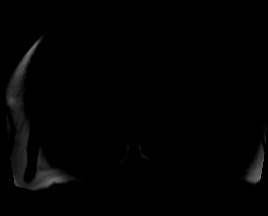

[Series 3: DWI · axial · 6.0mm · 1.46mm/px · 1 of 38 slices shown (2 of 2)]
[im 1/38]
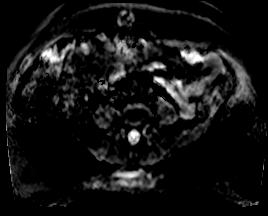

[Series 4: T2 fat-sat · axial · 6.0mm · 1.28mm/px · 1 of 36 slices shown]
[im 1/36]
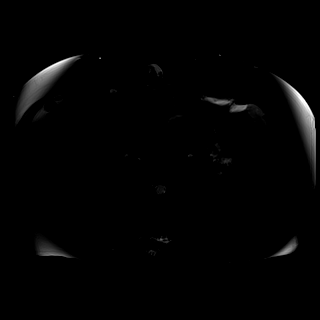

[Series 7: cor haste · coronal · 6.0mm · 1.56mm/px · 1 of 43 slices shown]
[im 1/43]
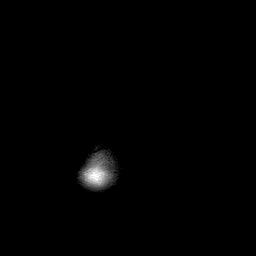

[Series 8: bSSFP · axial · 6.0mm · 2.19mm/px · z∈[-182,+124]mm · 2 of 52 slices shown]
[im 1/52]
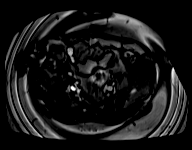
[im 52/52]
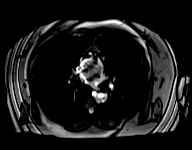

[Series 9: T1 dynamic · axial · 3.0mm · 1.64mm/px · z∈[-171,+114]mm · 3 of 96 slices shown (1 of 10)]
[im 1/96]
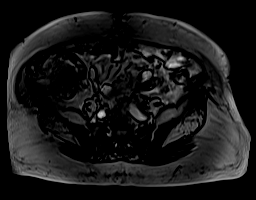
[im 48/96]
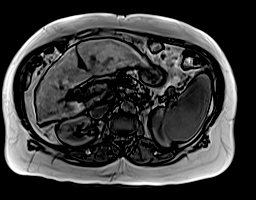
[im 96/96]
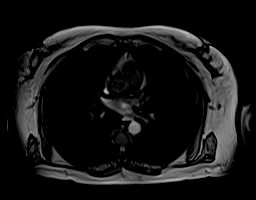

[Series 10: T1 dynamic · axial · 3.0mm · 1.64mm/px · z∈[-171,+114]mm · 3 of 96 slices shown (2 of 10)]
[im 1/96]
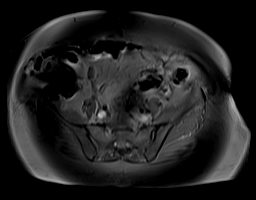
[im 48/96]
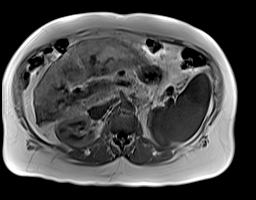
[im 96/96]
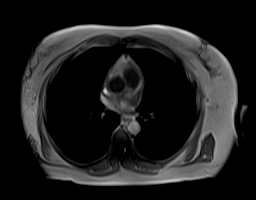

[Series 12: T1 dynamic · axial · 3.0mm · 1.64mm/px · z∈[-171,+114]mm · 3 of 96 slices shown (3 of 10)]
[im 1/96]
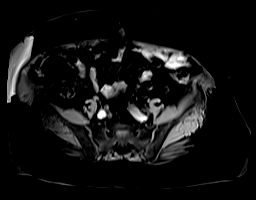
[im 48/96]
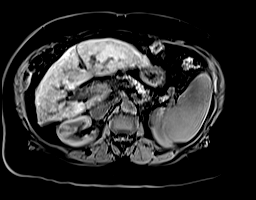
[im 96/96]
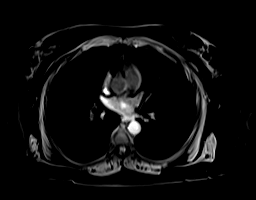

[Series 17: T1 dynamic · axial · 3.0mm · 1.64mm/px · z∈[-171,+114]mm · 3 of 96 slices shown (4 of 10)]
[im 1/96]
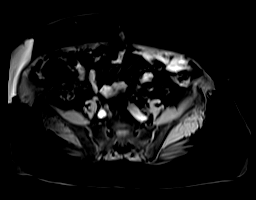
[im 48/96]
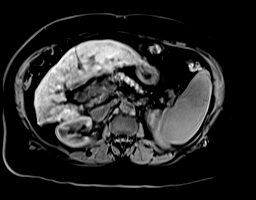
[im 96/96]
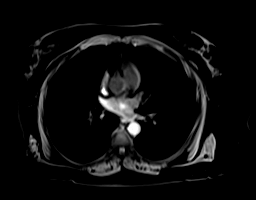

[Series 19: T1 dynamic · axial · 3.0mm · 1.64mm/px · z∈[-171,+114]mm · 3 of 96 slices shown (5 of 10)]
[im 1/96]
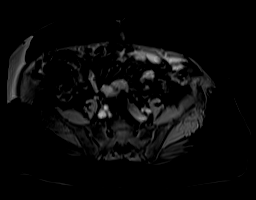
[im 48/96]
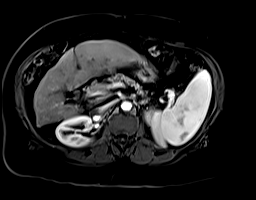
[im 96/96]
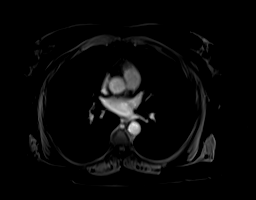

[Series 20: T1 dynamic · axial · 3.0mm · 1.64mm/px · z∈[-171,+114]mm · 3 of 96 slices shown (6 of 10)]
[im 1/96]
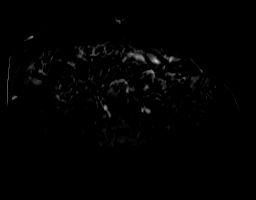
[im 48/96]
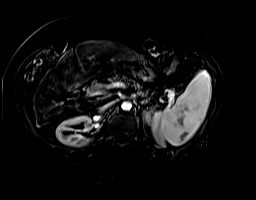
[im 96/96]
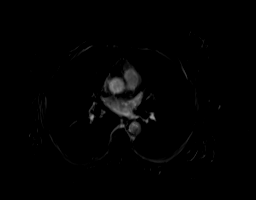

[Series 22: T1 dynamic · axial · 3.0mm · 1.64mm/px · z∈[-171,+114]mm · 3 of 96 slices shown (7 of 10)]
[im 1/96]
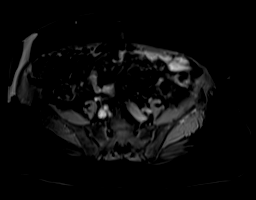
[im 48/96]
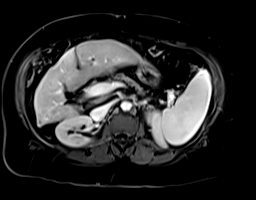
[im 96/96]
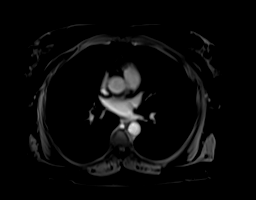

[Series 23: T1 dynamic · axial · 3.0mm · 1.64mm/px · z∈[-171,+114]mm · 3 of 96 slices shown (8 of 10)]
[im 1/96]
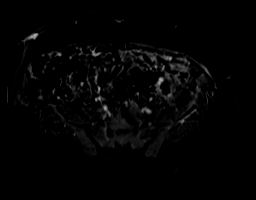
[im 48/96]
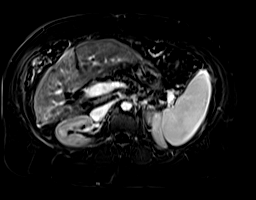
[im 96/96]
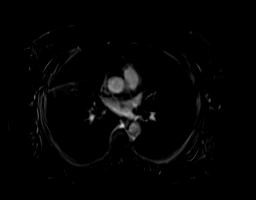

[Series 25: T1 dynamic · axial · 3.0mm · 1.64mm/px · z∈[-171,+114]mm · 3 of 96 slices shown (9 of 10)]
[im 1/96]
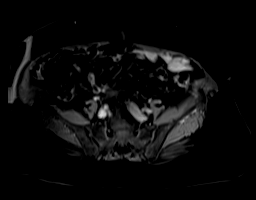
[im 48/96]
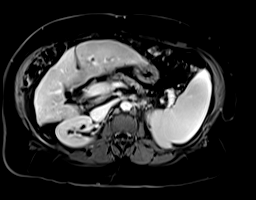
[im 96/96]
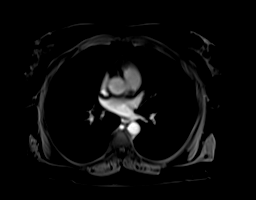

[Series 26: T1 dynamic · axial · 3.0mm · 1.64mm/px · z∈[-171,+114]mm · 3 of 96 slices shown (10 of 10)]
[im 1/96]
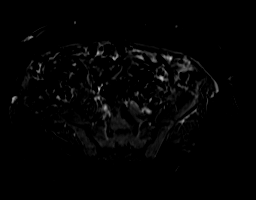
[im 48/96]
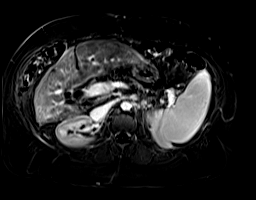
[im 96/96]
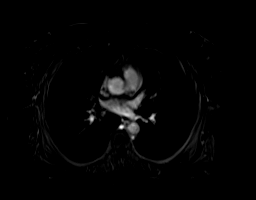

[Series 27: ax_haste_mbh · axial · 6.0mm · 1.31mm/px · 1 of 42 slices shown]
[im 1/42]
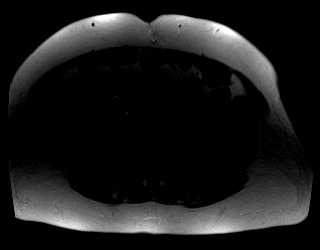

[Series 29: cor_vibe_dixon_delayed_w · coronal · 3.5mm · 2.34mm/px · 3 of 87 slices shown]
[im 1/87]
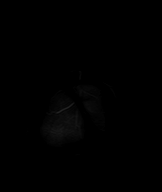
[im 44/87]
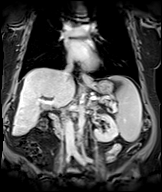
[im 87/87]
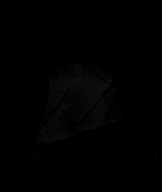

[Series 31: ax_dixon_delayed_w_reg · axial · 3.0mm · 1.64mm/px · z∈[-171,+114]mm · 3 of 96 slices shown]
[im 1/96]
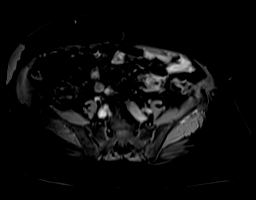
[im 48/96]
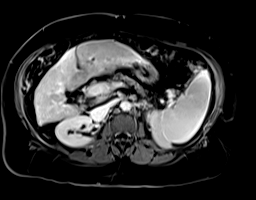
[im 96/96]
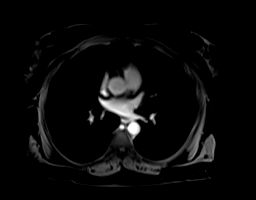

[Series 32: ax_dixon_delayed_w_reg_sub · axial · 3.0mm · 1.64mm/px · z∈[-171,+114]mm · 3 of 96 slices shown]
[im 1/96]
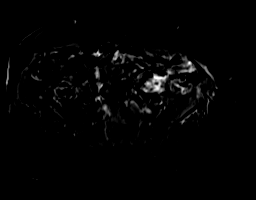
[im 48/96]
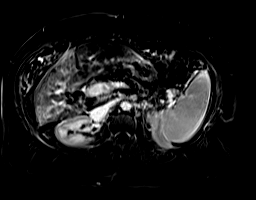
[im 96/96]
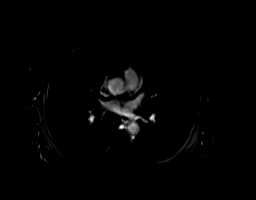

[48 of 48 positions shown; findings below may reference images not displayed]

FINDINGS: Lower chest: Incidental imaging of the lung bases is unremarkable on
MRI.

Hepatobiliary: Cirrhotic hepatic morphology with numerous hepatic
cysts and peribiliary cysts. Post cholecystectomy with stable
prominence of the common bile duct up to 13 mm greatest caliber
above the pancreas.

Observation 1: Subtle foci of arterial phase enhancement which are
reported in aggregate many along the margin of the liver capsule but
some deep to the margin of the capsule. For example, (image 37/19) 7
mm focus in hepatic subsegment VI is unchanged.

(Image 26/19) 5 mm focus in the anterior LEFT hemi liver, hepatic
subsegment II appears smaller on today's arterial phase study. Other
scattered small foci show no change as well. No signs of washout.

The portal vein is patent.  Hepatic veins are patent.

Variant hepatic arterial anatomy with replaced RIGHT hepatic artery
arising from the SMA and accessory or replaced LEFT hepatic artery
arising from the LEFT gastric artery.

Pancreas: 12 mm lesion in the neck of the pancreas a junction of
head and body of the pancreas 9 mm previously (image [DATE]) no main
duct dilation.

Cystic lesions without signs of enhancement in the head of the
pancreas largest at 12 mm are unchanged (image [DATE]) no signs of
pancreatic inflammation. Intrinsic T1 signal of the pancreas is
preserved.

Spleen:  Splenomegaly as before.

Adrenals/Urinary Tract: Small renal cysts on the RIGHT. No
hydronephrosis. No suspicious renal lesion. Normal appearance of the
adrenal glands.

Stomach/Bowel: Unremarkable to the extent evaluated on abdominal
MRI.

Vascular/Lymphatic: Aortic atherosclerosis. No aneurysmal dilation.
No adenopathy in the abdomen.

Other:  No ascites.

Musculoskeletal: No suspicious bone lesions identified.
IMPRESSION: 1. Hepatic cirrhosis and signs of portal hypertension also with
numerous hepatic cysts and peribiliary cysts.
2. Subtle foci of arterial phase enhancement which are reported in
aggregate many along the margin of the liver capsule but some deep
to the margin of the capsule. Again meeting criteria for DATABEX
category 3 lesions without interval change. Consider follow-up at 6
months for further evaluation.
3. Splenomegaly as before.
4. Cystic lesions in the head of the pancreas are unchanged.
Attention on follow-up. These could represent small intraductal
papillary mucinous neoplasm of the pancreas versus pseudocyst.
5. Enlarging cystic lesion at the junction of head and body of the
pancreas without signs of abnormal enhancement. Some mild
enlargement not currently meeting criteria for "growth". Given
enlargement would suggest MRCP at next follow-up interval with
special attention to the cystic areas on follow-up assessment.

## 2021-11-12 MED ORDER — GADOBUTROL 1 MMOL/ML IV SOLN
8.0000 mL | Freq: Once | INTRAVENOUS | Status: AC | PRN
  Administered 2021-11-12: 13:00:00 8 mL via INTRAVENOUS

## 2021-11-16 DIAGNOSIS — Z419 Encounter for procedure for purposes other than remedying health state, unspecified: Secondary | ICD-10-CM | POA: Diagnosis not present

## 2021-11-19 ENCOUNTER — Ambulatory Visit: Payer: Medicaid Other

## 2021-11-25 ENCOUNTER — Ambulatory Visit: Payer: Medicaid Other

## 2021-11-25 ENCOUNTER — Telehealth: Payer: Self-pay | Admitting: *Deleted

## 2021-11-25 DIAGNOSIS — B182 Chronic viral hepatitis C: Secondary | ICD-10-CM

## 2021-11-25 DIAGNOSIS — K835 Biliary cyst: Secondary | ICD-10-CM

## 2021-11-25 DIAGNOSIS — K766 Portal hypertension: Secondary | ICD-10-CM

## 2021-11-25 DIAGNOSIS — K862 Cyst of pancreas: Secondary | ICD-10-CM

## 2021-11-25 DIAGNOSIS — K7469 Other cirrhosis of liver: Secondary | ICD-10-CM

## 2021-11-25 DIAGNOSIS — K7689 Other specified diseases of liver: Secondary | ICD-10-CM

## 2021-11-25 NOTE — Telephone Encounter (Addendum)
Copied from [place] [phone]. Topic: General - Other >> Nov 24, 2021  1:17 PM Erick Blinks wrote: Reason for CRM: Pt wants to discuss recent MRI results, please advise  Best contact: [phone] ------------------------------------------------------------ Patient DOB verified. Instructed to call ordering physician, Dr. Modena Nunnery with [facility] to get results of MRI from 11/12/2021. Pt had an apt with same in Octoboer 2022.   Given phone number - [phone] and office hours.

## 2021-11-26 ENCOUNTER — Telehealth: Payer: Self-pay | Admitting: Gastroenterology

## 2021-11-26 ENCOUNTER — Telehealth: Payer: Self-pay | Admitting: Family Medicine

## 2021-11-26 ENCOUNTER — Other Ambulatory Visit: Payer: Self-pay

## 2021-11-26 NOTE — Telephone Encounter (Signed)
Patient called requesting the results of her MRI.  Please call.  Thank you.

## 2021-11-26 NOTE — Patient Outreach (Signed)
Medicaid Managed Care   Nurse Care Manager Note  11/26/2021 Name:  Samantha Evans MRN:  [id number] DOB:  Mar 22, 1961  Samantha Evans is an 61 y.o. year old female who is a primary patient of Samantha Rakes, MD.  The [facility] Managed Care Coordination team was consulted for assistance with:    HTN Hepatitis C Reoccurring hernia issues  Samantha Evans was given information about Medicaid Managed Care Coordination team services today. [place] Patient agreed to services and verbal consent obtained.  Engaged with patient by telephone for follow up visit in response to provider referral for case management and/or care coordination services.   Assessments/Interventions:  Review of past medical history, allergies, medications, health status, including review of consultants reports, laboratory and other test data, was performed as part of comprehensive evaluation and provision of chronic care management services.  SDOH (Social Determinants of Health) assessments and interventions performed:   Care Plan  No Known Allergies  Medications Reviewed Today     Reviewed by Inge Rise, RN (Case Manager) on 11/26/21 at 1506  Med List Status: <None>   Medication Order Taking? Sig Documenting Provider Last Dose Status Informant  amLODipine (NORVASC) 10 MG tablet [id number] No Take 1 tablet (10 mg total) by mouth daily.  Patient not taking: Reported on 09/30/2021   Samantha Rakes, MD Not Taking Active   psyllium (METAMUCIL SMOOTH TEXTURE) 58.6 % powder [id number] No Take 1 packet by mouth daily.  Patient taking differently: Take 1 packet by mouth daily as needed.   [name], MD Taking Active   Sofosbuvir-Velpatasvir [facility]) 400-100 MG TABS [id number] No Take 1 tablet by mouth daily.  Patient not taking: Reported on 10/28/2021   Kuppelweiser, Samantha Evans, RPH-CPP Not Taking Active   spironolactone (ALDACTONE) 50 MG tablet [id number] No Take 1 tablet (50 mg total) by mouth daily.  [name], MD Taking Active             Patient Active Problem List   Diagnosis Date Noted   Peripheral edema 05/12/2021   Chronic hepatitis C without hepatic coma ([place]) 03/25/2021   Acid reflux 03/25/2021   Incarcerated umbilical hernia 03/54/6568   Small bowel obstruction ([place]) 02/27/2021   Hepatic cirrhosis ([name])- by CT scan 02/27/2021   AKI (acute kidney injury) ([name]) 02/27/2021    Conditions to be addressed/monitored per PCP order:  HTN and Hepatitis C and reoccurring hernia issues  Care Plan : [facility] Plan of Care  Updates made by Inge Rise, RN since 11/26/2021 12:00 AM     Problem: Care Management and Care Coordination Needs for HTN, Hepatitis C and reoccurring hernia issues      Long-Range Goal: RNCM Plan of Care for Care Management and Care Coordination Needs (HTN, Hepatitis C, Reoccuring Hernia Issues)   Start Date: 07/22/2021  Expected End Date: 01/17/2022  Priority: High  Note:   Current Barriers:  Knowledge Deficits related to plan of care for management of HTN and Hepatitis C and reoccurring hernia issues  Care Coordination needs related to Limited social support, Transportation, Medication procurement, Social Isolation, and Limited access to caregiver  Chronic Disease Management support and education needs related to HTN and Hepatitis C and reoccurring hernia issues Lacks caregiver support.        Non-adherence to scheduled provider appointments Non-adherence to prescribed medication regimen Difficulty obtaining medications No Advanced Directives in place Transportation Barrier being addressed by Samantha Evans   RNCM Clinical Goal(s):  Patient will verbalize understanding of plan  for management of HTN and Hepatitis C and reoccurring hernia issues as evidenced by patient verbalization of understanding of HTN, Hepatitis C and hernia issues and when to report changes to PCP. verbalize basic understanding of HTN and Hepatitis C and  reoccurring hernia issues disease process and self health management plan as evidenced by improved control of diseases. take all medications exactly as prescribed and will call provider for medication related questions as evidenced by improved control of diseases    attend all scheduled medical appointments: Right eye cataract removal surgery tomorrow 11/27/21 and Left eye cataract removal surgery in February 2023 as evidenced by attending all scheduled appointments        demonstrate improved and ongoing adherence to prescribed treatment plan for HTN and Hepatitis C and reoccurring hernia issues as evidenced by improved control of diseases. continue to work with Consulting civil engineer and/or Social Worker to address care management and care coordination needs related to HTN and Hepatitis C and reoccurring hernia issues as evidenced by adherence to CM Team Scheduled appointments     through collaboration with Consulting civil engineer, provider, and care team.   Interventions: Inter-disciplinary care team collaboration (see longitudinal plan of care) Evaluation of current treatment plan related to  self management and patient's adherence to plan as established by provider   Hepatitis C and Reoccurring Hernia Issues  (Status: Goal on Track (progressing): YES.) Evaluation of current treatment plan related to  Hepatitis C and reoccurring hernia issues. MRI of Liver done on 11/13/2021.  Patient reports less pain overall at hernia area and has not needed to take pain medication for the last 3 weeks.  Patient states she can feel the hernia in the mornings upon waking and describes it as "feeling like a bubble" .  Patient awaiting results of Liver MRI. Transportation needs addressed and self-management and patient's adherence to plan as established by provider. Discussed plans with patient for ongoing care management follow up and provided patient with direct contact information for care management team Advised patient to  report any changes in hernia to PCP; Continue to provide education to patient re: pain management for any hernia pain including PRN pain medication use, wearing hernia belt or supportive underwear/clothing to support abdominal area. Reviewed medications with patient and discussed importance of adhering to medication regimen. Reviewed scheduled/upcoming provider appointments including cataract removal surgery in January (right eye) and February (left eye).  Hypertension: (Status: Goal on Track (progressing): YES.) Last practice recorded BP readings:   BP Readings from Last 3 Encounters:  09/08/21 134/60  08/13/21 120/80  07/23/21 132/61    Most recent eGFR/CrCl: No results found for: EGFR  No components found for: CRCL  Evaluation of current treatment plan related to hypertension self management and patient's adherence to plan as established by provider.   Reviewed prescribed diet : low sodium diet.  Patient continues to adhere to not adding any salt to food or cooking. Reviewed medications with patient and discussed importance of compliance; Patient continues to is only taking Spironolactone at this time.  Patient informed this RNCM that she usually takes Spironolactone in the evening daily and has to urinate during the night.  I instructed the patient to take this medication during the day, preferably in the morning every day at the same time, so that it will cause her to urinate more during the day versus at night.  Provided assistance in the past with obtaining home blood pressure monitor via free medicaid plan but patient refused and  feels she does not need one at this time;  Discussed plans with patient for ongoing care management follow up and provided patient with direct contact information for care management team; Reviewed scheduled/upcoming provider appointments.  Continued education on prescribed diet low sodium diet.  Patient states she does cooking herself and does not use any salt  on food.  We discussed the importance of reading food labels for salt content.   Patient Goals/Self-Care Activities: Patient will self administer medications as prescribed as evidenced by self report/primary caregiver report  Patient will attend all scheduled provider appointments as evidenced by clinician review of documented attendance to scheduled appointments and patient/caregiver report Patient will call pharmacy for medication refills as evidenced by patient report and review of pharmacy fill history as appropriate Patient will continue to perform ADL's independently as evidenced by patient/caregiver report Patient will continue to perform IADL's independently as evidenced by patient/caregiver report Patient will call provider office for new concerns or questions as evidenced by review of documented incoming telephone call notes and patient report - learn about high blood pressure - keep all doctor appointments - take medications for blood pressure exactly as prescribed - report new symptoms to your doctor - limit salt intake to 2000 mg/day       Follow Up:  Patient agrees to Care Plan and Follow-up.  Plan: The Managed Medicaid care management team will reach out to the patient again over the next 30 days.  Date/time of next scheduled RN care management/care coordination outreach:  12/23/2021 at 4:00 pm  Salvatore Marvel RN, [place] [facility] Mobile: [phone]

## 2021-11-26 NOTE — Telephone Encounter (Signed)
Patient inquiring about MRI results and would like a follow up call.

## 2021-11-26 NOTE — Telephone Encounter (Signed)
Following message sent via My Chart:  Hi Samantha Evans,   We received notification that you were requesting your MRI results. Returned pt call. Your provider has not yet been reviewed your results. Once your provider has reviewed your results, you will receive a call or a My Chart message about your results and along with any recommendations.   HEALTHCARE LAWS AND MY CHART RESULTS:    Due to recent changes in healthcare laws, you may see results of your imaging and/or laboratory studies on MyChart before I have had a chance to review them.  I understand that in some cases there may be results that are confusing or concerning to you. Please understand that not all results are received at the same time and often I may need to interpret multiple results in order to provide you with the best plan of care or course of treatment. Therefore, I ask that you please allow me ample time to thoroughly review all your results before contacting my office for clarification.    Thank you

## 2021-11-26 NOTE — Telephone Encounter (Signed)
Patient requesting results from CT on 12/29. Please advise.

## 2021-11-26 NOTE — Patient Instructions (Signed)
Visit Information  Ms. Terpening was given information about Medicaid Managed Care team care coordination services as a part of their [facility] Medicaid benefit. Rillie Riffel [place] verbally consented to engagement with the [facility] Managed Care team.   If you are experiencing a medical emergency, please call 911 or report to your local emergency department or urgent care.   If you have a non-emergency medical problem during routine business hours, please contact your provider's office and ask to speak with a nurse.   For questions related to your [facility] health plan, please call: [phone] or go here:[email or web address]  If you would like to schedule transportation through your [facility] plan, please call the following number at least 2 days in advance of your appointment: [phone].  Call the [place] at [phone], at any time, 24 hours a day, 7 days a week. If you are in danger or need immediate medical attention call 911.  If you would like help to quit smoking, call 1-800-QUIT-NOW 434-075-1637) OR Espaol: 1-855-Djelo-Ya (8-841-660-6301) o para ms informacin haga clic aqu or Text READY to 200-400 to register via text  Ms. Parrella - following are the goals we discussed in your visit today:   Goals Addressed             This Visit's Progress    RNCM - Hepatitis Monitored & Managed       Timeframe:  Long-Range Goal Priority:  High Start Date:  07/22/2021                           Expected End Date:   01/17/2022       Patient Goals:   Patient will self administer medications as prescribed Patient will attend all scheduled provider appointments Patient will call pharmacy for medication refills Patient will continue to perform ADL's independently Patient will continue to perform IADL's independently Patient will call provider office for new concerns or questions               RNCM - Hypertension Monitored & Managed        Timeframe:  Long-Range Goal Priority:  High Start Date:  07/22/2021                           Expected End Date:  01/17/2022     Patient Goals: Patient will self administer medications as prescribed Patient will attend all scheduled provider appointments Patient will call pharmacy for medication refills Patient will continue to perform ADL's independently Patient will continue to perform IADL's independently Patient will call provider office for new concerns or questions   - learn about high blood pressure - keep all doctor appointments - take medications for blood pressure exactly as prescribed - report new symptoms to your doctor - limit salt intake to 2000 mg/day            RNCM - Reoccurring Hernia Issues Monitored & Managed       Timeframe:  Long-Range Goal Priority:  High Start Date: 07/22/2021                           Expected End Date: 01/17/2022     Patient Goals:   *    Patient will self administer medications as prescribed Patient will attend all scheduled provider appointments Patient will call pharmacy for medication refills Patient will continue to perform ADL's independently  Patient will continue to perform IADL's independently Patient will call provider office for new concerns or questions                     Please see education materials related to today's visit provided by MyChart link.  The patient verbalized understanding of instructions provided today and declined a print copy of patient instruction materials.   The Managed Medicaid care management team will reach out to the patient again over the next 30 days.   Salvatore Marvel RN, BSN Community Care Coordinator [place] [facility] Mobile: [phone]   Following is a copy of your plan of care:  Care Plan : [facility] Plan of Care  Updates made by Inge Rise, RN since 11/26/2021 12:00 AM     Problem: Care Management and Care Coordination Needs for HTN, Hepatitis C and  reoccurring hernia issues      Long-Range Goal: RNCM Plan of Care for Care Management and Care Coordination Needs (HTN, Hepatitis C, Reoccuring Hernia Issues)   Start Date: 07/22/2021  Expected End Date: 01/17/2022  Priority: High  Note:   Current Barriers:  Knowledge Deficits related to plan of care for management of HTN and Hepatitis C and reoccurring hernia issues  Care Coordination needs related to Limited social support, Transportation, Medication procurement, Social Isolation, and Limited access to caregiver  Chronic Disease Management support and education needs related to HTN and Hepatitis C and reoccurring hernia issues Lacks caregiver support.        Non-adherence to scheduled provider appointments Non-adherence to prescribed medication regimen Difficulty obtaining medications No Advanced Directives in place Transportation Barrier being addressed by [name]   RNCM Clinical Goal(s):  Patient will verbalize understanding of plan for management of HTN and Hepatitis C and reoccurring hernia issues as evidenced by patient verbalization of understanding of HTN, Hepatitis C and hernia issues and when to report changes to PCP. verbalize basic understanding of HTN and Hepatitis C and reoccurring hernia issues disease process and self health management plan as evidenced by improved control of diseases. take all medications exactly as prescribed and will call provider for medication related questions as evidenced by improved control of diseases    attend all scheduled medical appointments: Right eye cataract removal surgery tomorrow 11/27/21 and Left eye cataract removal surgery in February 2023 as evidenced by attending all scheduled appointments        demonstrate improved and ongoing adherence to prescribed treatment plan for HTN and Hepatitis C and reoccurring hernia issues as evidenced by improved control of diseases. continue to work with Consulting civil engineer and/or Social Worker to  address care management and care coordination needs related to HTN and Hepatitis C and reoccurring hernia issues as evidenced by adherence to CM Team Scheduled appointments     through collaboration with Consulting civil engineer, provider, and care team.   Interventions: Inter-disciplinary care team collaboration (see longitudinal plan of care) Evaluation of current treatment plan related to  self management and patient's adherence to plan as established by provider   Hepatitis C and Reoccurring Hernia Issues  (Status: Goal on Track (progressing): YES.) Evaluation of current treatment plan related to  Hepatitis C and reoccurring hernia issues. MRI of Liver done on 11/13/2021.  Patient reports less pain overall at hernia area and has not needed to take pain medication for the last 3 weeks.  Patient states she can feel the hernia in the mornings upon waking and describes it  as "feeling like a bubble" .  Patient awaiting results of Liver MRI. Transportation needs addressed and self-management and patient's adherence to plan as established by provider. Discussed plans with patient for ongoing care management follow up and provided patient with direct contact information for care management team Advised patient to report any changes in hernia to PCP; Continue to provide education to patient re: pain management for any hernia pain including PRN pain medication use, wearing hernia belt or supportive underwear/clothing to support abdominal area. Reviewed medications with patient and discussed importance of adhering to medication regimen. Reviewed scheduled/upcoming provider appointments including cataract removal surgery in January (right eye) and February (left eye).  Hypertension: (Status: Goal on Track (progressing): YES.) Last practice recorded BP readings:   BP Readings from Last 3 Encounters:  09/08/21 134/60  08/13/21 120/80  07/23/21 132/61    Most recent eGFR/CrCl: No results found for: EGFR  No  components found for: CRCL  Evaluation of current treatment plan related to hypertension self management and patient's adherence to plan as established by provider.   Reviewed prescribed diet : low sodium diet.  Patient continues to adhere to not adding any salt to food or cooking. Reviewed medications with patient and discussed importance of compliance; Patient continues to is only taking Spironolactone at this time.  Patient informed this RNCM that she usually takes Spironolactone in the evening daily and has to urinate during the night.  I instructed the patient to take this medication during the day, preferably in the morning every day at the same time, so that it will cause her to urinate more during the day versus at night.  Provided assistance in the past with obtaining home blood pressure monitor via free medicaid plan but patient refused and feels she does not need one at this time;  Discussed plans with patient for ongoing care management follow up and provided patient with direct contact information for care management team; Reviewed scheduled/upcoming provider appointments.  Continued education on prescribed diet low sodium diet.  Patient states she does cooking herself and does not use any salt on food.  We discussed the importance of reading food labels for salt content.   Patient Goals/Self-Care Activities: Patient will self administer medications as prescribed as evidenced by self report/primary caregiver report  Patient will attend all scheduled provider appointments as evidenced by clinician review of documented attendance to scheduled appointments and patient/caregiver report Patient will call pharmacy for medication refills as evidenced by patient report and review of pharmacy fill history as appropriate Patient will continue to perform ADL's independently as evidenced by patient/caregiver report Patient will continue to perform IADL's independently as evidenced by  patient/caregiver report Patient will call provider office for new concerns or questions as evidenced by review of documented incoming telephone call notes and patient report - learn about high blood pressure - keep all doctor appointments - take medications for blood pressure exactly as prescribed - report new symptoms to your doctor - limit salt intake to 2000 mg/day

## 2021-11-26 NOTE — Telephone Encounter (Signed)
Pt has been called to inform her as below. Asked that she please remain pt until results have been reviewed. No further action required at this time. From today's encounter:  Lonia Farber,   We received notification that you were requesting your MRI results. Returned pt call. Your provider has not yet been reviewed your results. Once your provider has reviewed your results, you will receive a call or a My Chart message about your results and along with any recommendations.   HEALTHCARE LAWS AND MY CHART RESULTS:    Due to recent changes in healthcare laws, you may see results of your imaging and/or laboratory studies on MyChart before I have had a chance to review them.  I understand that in some cases there may be results that are confusing or concerning to you. Please understand that not all results are received at the same time and often I may need to interpret multiple results in order to provide you with the best plan of care or course of treatment. Therefore, I ask that you please allow me ample time to thoroughly review all your results before contacting my office for clarification.    Thank you

## 2021-11-27 DIAGNOSIS — H2511 Age-related nuclear cataract, right eye: Secondary | ICD-10-CM | POA: Diagnosis not present

## 2021-11-27 DIAGNOSIS — Z7689 Persons encountering health services in other specified circumstances: Secondary | ICD-10-CM | POA: Diagnosis not present

## 2021-11-27 NOTE — Telephone Encounter (Signed)
MRI shows: - Hepatic cirrhosis and signs of portal hypertension also with numerous hepatic cysts and peribiliary cysts. There is splenomegaly related to the cirrhosis. - Subtle foci of arterial phase enhancement along the margin of the liver capsule - unchanged.  - Unchanged pancreatic cysts.  - There is one enlarging cyst in the pancreas. The change is very subtle.  Given these results, I recommend an MRI/MRCP in 3-6 months. AFP and Ca-19-9 before then.  Please schedule office visit with me in 2/23 or 3/23 for routine care.  Thanks.  KLB

## 2021-11-28 DIAGNOSIS — Z7689 Persons encountering health services in other specified circumstances: Secondary | ICD-10-CM | POA: Diagnosis not present

## 2021-11-28 NOTE — Addendum Note (Signed)
Addended by: Hardie Pulley, Cesily Cuoco J on: 11/28/2021 09:09 AM   Modules accepted: Orders

## 2021-11-28 NOTE — Telephone Encounter (Signed)
Called pt and informed of results and recommendations as reviewed and documented by [name]. Verbalized acceptance and understanding. Pt has been scheduled for routine f/u 01/16/22 @ 320pm. Orders placed for pt to have MRI/MRCP and labs per [name] request. Reminder created to ensure all testing has been performed and received in a timely manner.

## 2021-12-05 DIAGNOSIS — Z7689 Persons encountering health services in other specified circumstances: Secondary | ICD-10-CM | POA: Diagnosis not present

## 2021-12-17 DIAGNOSIS — Z419 Encounter for procedure for purposes other than remedying health state, unspecified: Secondary | ICD-10-CM | POA: Diagnosis not present

## 2021-12-23 ENCOUNTER — Other Ambulatory Visit: Payer: Self-pay

## 2021-12-23 NOTE — Patient Instructions (Signed)
Visit Information  Samantha Evans was given information about Medicaid Managed Care team care coordination services as a part of their [facility] Medicaid benefit. Samantha Evans verbally consented to engagement with the [facility] Managed Care team.   If you are experiencing a medical emergency, please call 911 or report to your local emergency department or urgent care.   If you have a non-emergency medical problem during routine business hours, please contact your provider's office and ask to speak with a nurse.   For questions related to your [facility] health plan, please call: [phone] or go here:[email or web address]  If you would like to schedule transportation through your [facility] plan, please call the following number at least 2 days in advance of your appointment: [phone].  Call the [place] at [phone], at any time, 24 hours a day, 7 days a week. If you are in danger or need immediate medical attention call 911.  If you would like help to quit smoking, call 1-800-QUIT-NOW (416) 843-4499) OR Espaol: 1-855-Djelo-Ya (0-034-917-9150) o para ms informacin haga clic aqu or Text READY to 200-400 to register via text  Samantha Evans - following are the goals we discussed in your visit today:   Goals Addressed             This Visit's Progress    RNCM - Hepatitis Monitored & Managed       Timeframe:  Long-Range Goal Priority:  High Start Date:  07/22/2021                           Expected End Date:   02/12/2022       Patient Goals:   Patient will self administer medications as prescribed Patient will attend all scheduled provider appointments Patient will call pharmacy for medication refills Patient will continue to perform ADL's independently Patient will continue to perform IADL's independently Patient will call provider office for new concerns or questions               RNCM - Hypertension Monitored & Managed        Timeframe:  Long-Range Goal Priority:  High Start Date:  07/22/2021                           Expected End Date:  02/12/2022     Patient Goals: Patient will self administer medications as prescribed Patient will attend all scheduled provider appointments Patient will call pharmacy for medication refills Patient will continue to perform ADL's independently Patient will continue to perform IADL's independently Patient will call provider office for new concerns or questions   - learn about high blood pressure - keep all doctor appointments - take medications for blood pressure exactly as prescribed - report new symptoms to your doctor - limit salt intake to 2000 mg/day            RNCM - Reoccurring Hernia Issues Monitored & Managed       Timeframe:  Long-Range Goal Priority:  High Start Date: 07/22/2021                           Expected End Date: 02/12/2022     Patient Goals:   *    Patient will self administer medications as prescribed Patient will attend all scheduled provider appointments Patient will call pharmacy for medication refills Patient will continue to perform ADL's independently  Patient will continue to perform IADL's independently Patient will call provider office for new concerns or questions                     Please see education materials related to today's visit provided by MyChart link.  The patient verbalized understanding of instructions provided today and will review via My Chart link.  The Managed Medicaid care management team will reach out to the patient again over the next 30 days.   Salvatore Marvel RN, BSN Community Care Coordinator [place] [facility] Mobile: [phone]   Following is a copy of your plan of care:  Care Plan : [facility] Plan of Care  Updates made by Inge Rise, RN since 12/23/2021 12:00 AM     Problem: Care Management and Care Coordination Needs for HTN, Hepatitis C and reoccurring hernia issues       Long-Range Goal: RNCM Plan of Care for Care Management and Care Coordination Needs (HTN, Hepatitis C, Reoccuring Hernia Issues)   Start Date: 07/22/2021  Expected End Date: 02/12/2022  Priority: High  Note:   Current Barriers:  Knowledge Deficits related to plan of care for management of HTN and Hepatitis C and reoccurring hernia issues  Care Coordination needs related to Limited social support, Transportation, Medication procurement, Social Isolation, and Limited access to caregiver  Chronic Disease Management support and education needs related to HTN and Hepatitis C and reoccurring hernia issues Lacks caregiver support.        Non-adherence to scheduled provider appointments Non-adherence to prescribed medication regimen Difficulty obtaining medications No Advanced Directives in place Transportation Barrier being addressed by [name]   RNCM Clinical Goal(s):  Patient will verbalize understanding of plan for management of HTN and Hepatitis C and reoccurring hernia issues as evidenced by patient verbalization of understanding of HTN, Hepatitis C and hernia issues and when to report changes to PCP. verbalize basic understanding of HTN and Hepatitis C and reoccurring hernia issues disease process and self health management plan as evidenced by improved control of diseases. take all medications exactly as prescribed and will call provider for medication related questions as evidenced by improved control of diseases    attend all scheduled medical appointments:  Left eye cataract removal surgery 01/01/2022; [name] (GI) 01/20/2022 as evidenced by attending all scheduled appointments        demonstrate ongoing adherence to prescribed treatment plan for HTN and Hepatitis C and reoccurring hernia issues as evidenced by improved control of diseases. continue to work with Consulting civil engineer and/or Social Worker to address care management and care coordination needs related to HTN and  Hepatitis C and reoccurring hernia issues as evidenced by adherence to CM Team Scheduled appointments     work with Education officer, museum to address [employer] knowledge of community resource: Dental Care covered bu Well Care Medicaid Insurance related to the management of HTN and Hepatitis C and Reoccurring hernia issues as evidenced by review of EMR and patient or Education officer, museum report     through collaboration with Consulting civil engineer, provider, and care team.   Interventions: Inter-disciplinary care team collaboration (see longitudinal plan of care) Evaluation of current treatment plan related to  self management and patient's adherence to plan as established by provider   Hepatitis C and Reoccurring Hernia Issues  (Status: Goal on Track (progressing): YES.) Evaluation of current treatment plan related to  Hepatitis C and reoccurring hernia issues. MRI of Liver done on 11/13/2021  with results given to patient by [name].  No significant changes since last MRI.  Plan to re-evaluate in 6 months.  Patient reports less pain overall at hernia area.  Patient states she can feel the hernia present but less concern overall .  Transportation needs addressed and self-management and patient's adherence to plan as established by provider. Discussed plans with patient for ongoing care management follow up and provided patient with direct contact information for care management team Advised patient to report any changes in hernia to PCP; Continue to review education with patient re: pain management for any hernia pain including PRN pain medication use, wearing hernia belt or supportive underwear/clothing to support abdominal area. Reviewed medications with patient and discussed importance of adhering to medication regimen. Reviewed scheduled/upcoming provider appointments including cataract removal 01/11/22 (left eye). & 01/20/22 follow up with GI MD, [name].  Patient had successful right eye cataract removal in January  2023.  Patient states her right eye vision is much clearer. Patient requested help with finding Dental Care covered under her Well [employer] Medicaid Insurance.  Referral made to [place] for Dental Care resources available with Well [employer] Medicaid Insurance.  Hypertension: (Status: Goal on Track (progressing): YES.) Last practice recorded BP readings:   BP Readings from Last 3 Encounters:  09/08/21 134/60  08/13/21 120/80  07/23/21 132/61    Most recent eGFR/CrCl: No results found for: EGFR  No components found for: CRCL  Evaluation of current treatment plan related to hypertension self management and patient's adherence to plan as established by provider.   Reviewed prescribed diet : low sodium diet.  Patient continues to adhere to not adding any salt to food or cooking. Reviewed medications with patient and discussed importance of compliance; Patient continues to is only taking Spironolactone at this time.  Provided assistance in the past with obtaining home blood pressure monitor via free medicaid plan but patient refused and feels she does not need one at this time.  Discussed if patient is getting her blood pressure outside the home.  Patient reports her blood pressure was "good" at eye doctor's office the day of her cataract surgery. Discussed plans with patient for ongoing care management follow up and provided patient with direct contact information for care management team; Reviewed scheduled/upcoming provider appointments.  Continued education on prescribed diet low sodium diet.  Patient states she does cooking herself and does not use any salt on food.  We discussed the importance of reading food labels for salt content.   Patient Goals/Self-Care Activities: Patient will self administer medications as prescribed as evidenced by self report/primary caregiver report  Patient will attend all scheduled provider appointments as evidenced by clinician review of documented attendance to  scheduled appointments and patient/caregiver report Patient will call pharmacy for medication refills as evidenced by patient report and review of pharmacy fill history as appropriate Patient will continue to perform ADL's independently as evidenced by patient/caregiver report Patient will continue to perform IADL's independently as evidenced by patient/caregiver report Patient will call provider office for new concerns or questions as evidenced by review of documented incoming telephone call notes and patient report - learn about high blood pressure - keep all doctor appointments - take medications for blood pressure exactly as prescribed - report new symptoms to your doctor - limit salt intake to 2000 mg/day

## 2021-12-23 NOTE — Patient Outreach (Signed)
Medicaid Managed Care   Nurse Care Manager Note  12/23/2021 Name:  Samantha Evans MRN:  [id number] DOB:  1961/11/01  Samantha Evans is an 61 y.o. year old female who is a primary patient of Samantha Rakes, MD.  The [facility] Managed Care Coordination team was consulted for assistance with:    HTN Hepatitis C Reoccurring Hernia Issues  [name] was given information about Medicaid Managed Care Coordination team services today. [name] agreed to services and verbal consent obtained.  Engaged with patient by telephone for follow up visit in response to provider referral for case management and/or care coordination services.   Assessments/Interventions:  Review of past medical history, allergies, medications, health status, including review of consultants reports, laboratory and other test data, was performed as part of comprehensive evaluation and provision of chronic care management services.  SDOH (Social Determinants of Health) assessments and interventions performed:   Care Plan  No Known Allergies  Medications Reviewed Today     Reviewed by Inge Rise, RN (Case Manager) on 12/23/21 at 1643  Med List Status: <None>   Medication Order Taking? Sig Documenting Provider Last Dose Status Informant  amLODipine (NORVASC) 10 MG tablet [id number] No Take 1 tablet (10 mg total) by mouth daily.  Patient not taking: Reported on 09/30/2021   Samantha Rakes, MD Not Taking Active   psyllium (METAMUCIL SMOOTH TEXTURE) 58.6 % powder [id number] No Take 1 packet by mouth daily.  Patient taking differently: Take 1 packet by mouth daily as needed.   [name], MD Taking Active   Sofosbuvir-Velpatasvir [facility]) 400-100 MG TABS [id number] No Take 1 tablet by mouth daily.  Patient not taking: Reported on 10/28/2021   Kuppelweiser, [name], RPH-CPP Not Taking Active   spironolactone (ALDACTONE) 50 MG tablet [id number] No Take 1 tablet (50 mg total) by mouth daily. [name], MD Taking Active             Patient Active Problem List   Diagnosis Date Noted   Peripheral edema 05/12/2021   Chronic hepatitis C without hepatic coma ([place]) 03/25/2021   Acid reflux 03/25/2021   Incarcerated umbilical hernia 83/41/9622   Small bowel obstruction ([place]) 02/27/2021   Hepatic cirrhosis ([place])- by CT scan 02/27/2021   AKI (acute kidney injury) ([place]) 02/27/2021    Conditions to be addressed/monitored per PCP order:  HTN and Hepatitis C and Reoccurring Hernia Issues  Care Plan : RNCM Plan of Care  Updates made by Inge Rise, RN since 12/23/2021 12:00 AM     Problem: Care Management and Care Coordination Needs for HTN, Hepatitis C and reoccurring hernia issues      Long-Range Goal: RNCM Plan of Care for Care Management and Care Coordination Needs (HTN, Hepatitis C, Reoccuring Hernia Issues)   Start Date: 07/22/2021  Expected End Date: 02/12/2022  Priority: High  Note:   Current Barriers:  Knowledge Deficits related to plan of care for management of HTN and Hepatitis C and reoccurring hernia issues  Care Coordination needs related to Limited social support, Transportation, Medication procurement, Social Isolation, and Limited access to caregiver  Chronic Disease Management support and education needs related to HTN and Hepatitis C and reoccurring hernia issues Lacks caregiver support.        Non-adherence to scheduled provider appointments Non-adherence to prescribed medication regimen Difficulty obtaining medications No Advanced Directives in place Transportation Barrier being addressed by [name]   RNCM Clinical Goal(s):  Patient will verbalize understanding of plan  for management of HTN and Hepatitis C and reoccurring hernia issues as evidenced by patient verbalization of understanding of HTN, Hepatitis C and hernia issues and when to report changes to PCP. verbalize basic understanding of HTN and Hepatitis C and reoccurring hernia  issues disease process and self health management plan as evidenced by improved control of diseases. take all medications exactly as prescribed and will call provider for medication related questions as evidenced by improved control of diseases    attend all scheduled medical appointments:  Left eye cataract removal surgery 01/01/2022; [name] (GI) 01/20/2022 as evidenced by attending all scheduled appointments        demonstrate ongoing adherence to prescribed treatment plan for HTN and Hepatitis C and reoccurring hernia issues as evidenced by improved control of diseases. continue to work with Consulting civil engineer and/or Social Worker to address care management and care coordination needs related to HTN and Hepatitis C and reoccurring hernia issues as evidenced by adherence to CM Team Scheduled appointments     work with Education officer, museum to address [employer] knowledge of community resource: Dental Care covered bu Well Care Medicaid Insurance related to the management of HTN and Hepatitis C and Reoccurring hernia issues as evidenced by review of EMR and patient or Education officer, museum report     through collaboration with Consulting civil engineer, provider, and care team.   Interventions: Inter-disciplinary care team collaboration (see longitudinal plan of care) Evaluation of current treatment plan related to  self management and patient's adherence to plan as established by provider   Hepatitis C and Reoccurring Hernia Issues  (Status: Goal on Track (progressing): YES.) Evaluation of current treatment plan related to  Hepatitis C and reoccurring hernia issues. MRI of Liver done on 11/13/2021 with results given to patient by [name].  No significant changes since last MRI.  Plan to re-evaluate in 6 months.  Patient reports less pain overall at hernia area.  Patient states she can feel the hernia present but less concern overall .  Transportation needs addressed and self-management and patient's adherence to plan as  established by provider. Discussed plans with patient for ongoing care management follow up and provided patient with direct contact information for care management team Advised patient to report any changes in hernia to PCP; Continue to review education with patient re: pain management for any hernia pain including PRN pain medication use, wearing hernia belt or supportive underwear/clothing to support abdominal area. Reviewed medications with patient and discussed importance of adhering to medication regimen. Reviewed scheduled/upcoming provider appointments including cataract removal 01/11/22 (left eye). & 01/20/22 follow up with GI MD, [name].  Patient had successful right eye cataract removal in January 2023.  Patient states her right eye vision is much clearer. Patient requested help with finding Dental Care covered under her Well [employer] Medicaid Insurance.  Referral made to [place] for Dental Care resources available with Well Care Medicaid Insurance.  Hypertension: (Status: Goal on Track (progressing): YES.) Last practice recorded BP readings:   BP Readings from Last 3 Encounters:  09/08/21 134/60  08/13/21 120/80  07/23/21 132/61    Most recent eGFR/CrCl: No results found for: EGFR  No components found for: CRCL  Evaluation of current treatment plan related to hypertension self management and patient's adherence to plan as established by provider.   Reviewed prescribed diet : low sodium diet.  Patient continues to adhere to not adding any salt to food or cooking. Reviewed medications with patient and discussed importance  of compliance; Patient continues to is only taking Spironolactone at this time.  Provided assistance in the past with obtaining home blood pressure monitor via free medicaid plan but patient refused and feels she does not need one at this time.  Discussed if patient is getting her blood pressure outside the home.  Patient reports her blood pressure was "good" at eye  doctor's office the day of her cataract surgery. Discussed plans with patient for ongoing care management follow up and provided patient with direct contact information for care management team; Reviewed scheduled/upcoming provider appointments.  Continued education on prescribed diet low sodium diet.  Patient states she does cooking herself and does not use any salt on food.  We discussed the importance of reading food labels for salt content.   Patient Goals/Self-Care Activities: Patient will self administer medications as prescribed as evidenced by self report/primary caregiver report  Patient will attend all scheduled provider appointments as evidenced by clinician review of documented attendance to scheduled appointments and patient/caregiver report Patient will call pharmacy for medication refills as evidenced by patient report and review of pharmacy fill history as appropriate Patient will continue to perform ADL's independently as evidenced by patient/caregiver report Patient will continue to perform IADL's independently as evidenced by patient/caregiver report Patient will call provider office for new concerns or questions as evidenced by review of documented incoming telephone call notes and patient report - learn about high blood pressure - keep all doctor appointments - take medications for blood pressure exactly as prescribed - report new symptoms to your doctor - limit salt intake to 2000 mg/day       Follow Up:  Patient agrees to Care Plan and Follow-up.  Plan: The Managed Medicaid care management team will reach out to the patient again over the next 30 days.  Date/time of next scheduled RN care management/care coordination outreach:  01/20/2022 at 3:00pm  Salvatore Marvel RN, [place] Coordinator [place] [facility] Mobile: [phone]

## 2021-12-28 DIAGNOSIS — H2512 Age-related nuclear cataract, left eye: Secondary | ICD-10-CM | POA: Diagnosis not present

## 2021-12-30 ENCOUNTER — Other Ambulatory Visit: Payer: Self-pay

## 2021-12-30 NOTE — Patient Outreach (Signed)
Medicaid Managed Care Social Work Note  12/30/2021 Name:  Samantha Evans MRN:  [id number] DOB:  07-15-1961  Samantha Evans is an 61 y.o. year old female who is a primary patient of [name], MD.  The [facility] Managed Care Coordination team was consulted for assistance with:   dental resources  Ms. Deshpande was given information about Medicaid Managed Care Coordination team services today. Caroline Patient agreed to services and verbal consent obtained.  Engaged with patient  for by telephone forinitial visit in response to referral for case management and/or care coordination services.   Assessments/Interventions:  Review of past medical history, allergies, medications, health status, including review of consultants reports, laboratory and other test data, was performed as part of comprehensive evaluation and provision of chronic care management services.  SDOH: (Social Determinant of Health) assessments and interventions performed: 12/30/21: BSW completed telephone outreach for dental resources. Patient stated she would like resources to mailed. Patient stated she did not need any other resources. BSW will mail letter with resources.   Advanced Directives Status:  Not addressed in this encounter.  Care Plan                 No Known Allergies  Medications Reviewed Today     Reviewed by Inge Rise, RN (Case Manager) on 12/23/21 at 1643  Med List Status: <None>   Medication Order Taking? Sig Documenting Provider Last Dose Status Informant  amLODipine (NORVASC) 10 MG tablet [id number] No Take 1 tablet (10 mg total) by mouth daily.  Patient not taking: Reported on 09/30/2021   [name], MD Not Taking Active   psyllium (METAMUCIL SMOOTH TEXTURE) 58.6 % powder [id number] No Take 1 packet by mouth daily.  Patient taking differently: Take 1 packet by mouth daily as needed.   [name], MD Taking Active   Sofosbuvir-Velpatasvir [facility]) 400-100 MG TABS  [id number] No Take 1 tablet by mouth daily.  Patient not taking: Reported on 10/28/2021   Kuppelweiser, [name], RPH-CPP Not Taking Active   spironolactone (ALDACTONE) 50 MG tablet [id number] No Take 1 tablet (50 mg total) by mouth daily. [name], MD Taking Active             Patient Active Problem List   Diagnosis Date Noted   Peripheral edema 05/12/2021   Chronic hepatitis C without hepatic coma ([place]) 03/25/2021   Acid reflux 03/25/2021   Incarcerated umbilical hernia 63/81/7711   Small bowel obstruction ([place]) 02/27/2021   Hepatic cirrhosis ([place])- by CT scan 02/27/2021   AKI (acute kidney injury) ([place]) 02/27/2021    Conditions to be addressed/monitored per PCP order:   dental resources  Care Plan : [facility] Plan of Care  Updates made by [name] since 12/30/2021 12:00 AM     Problem: Care Management and Care Coordination Needs for HTN, Hepatitis C and reoccurring hernia issues      Long-Range Goal: RNCM Plan of Care for Care Management and Care Coordination Needs (HTN, Hepatitis C, Reoccuring Hernia Issues)   Start Date: 07/22/2021  Expected End Date: 02/12/2022  Priority: High  Note:   Current Barriers:  Knowledge Deficits related to plan of care for management of HTN and Hepatitis C and reoccurring hernia issues  Care Coordination needs related to Limited social support, Transportation, Medication procurement, Social Isolation, and Limited access to caregiver  Chronic Disease Management support and education needs related to HTN and Hepatitis C and reoccurring hernia issues Lacks caregiver support.  Non-adherence to scheduled provider appointments Non-adherence to prescribed medication regimen Difficulty obtaining medications No Advanced Directives in place Transportation Barrier being addressed by [name]   RNCM Clinical Goal(s):  Patient will verbalize understanding of plan for management of HTN and Hepatitis C and reoccurring hernia  issues as evidenced by patient verbalization of understanding of HTN, Hepatitis C and hernia issues and when to report changes to PCP. verbalize basic understanding of HTN and Hepatitis C and reoccurring hernia issues disease process and self health management plan as evidenced by improved control of diseases. take all medications exactly as prescribed and will call provider for medication related questions as evidenced by improved control of diseases    attend all scheduled medical appointments:  Left eye cataract removal surgery 01/01/2022; [name] (GI) 01/20/2022 as evidenced by attending all scheduled appointments        demonstrate ongoing adherence to prescribed treatment plan for HTN and Hepatitis C and reoccurring hernia issues as evidenced by improved control of diseases. continue to work with Consulting civil engineer and/or Social Worker to address care management and care coordination needs related to HTN and Hepatitis C and reoccurring hernia issues as evidenced by adherence to CM Team Scheduled appointments     work with Education officer, museum to address Lacks knowledge of community resource: Dental Care covered bu Well Care Medicaid Insurance related to the management of HTN and Hepatitis C and Reoccurring hernia issues as evidenced by review of EMR and patient or Education officer, museum report     through collaboration with Consulting civil engineer, provider, and care team.   Interventions: Inter-disciplinary care team collaboration (see longitudinal plan of care) Evaluation of current treatment plan related to  self management and patient's adherence to plan as established by provider 12/30/21: BSW completed telephone outreach for dental resources. Patient stated she would like resources to mailed. Patient stated she did not need any other resources. BSW will mail letter with resources.    Hepatitis C and Reoccurring Hernia Issues  (Status: Goal on Track (progressing): YES.) Evaluation of current treatment plan related  to  Hepatitis C and reoccurring hernia issues. MRI of Liver done on 11/13/2021 with results given to patient by [name].  No significant changes since last MRI.  Plan to re-evaluate in 6 months.  Patient reports less pain overall at hernia area.  Patient states she can feel the hernia present but less concern overall .  Transportation needs addressed and self-management and patient's adherence to plan as established by provider. Discussed plans with patient for ongoing care management follow up and provided patient with direct contact information for care management team Advised patient to report any changes in hernia to PCP; Continue to review education with patient re: pain management for any hernia pain including PRN pain medication use, wearing hernia belt or supportive underwear/clothing to support abdominal area. Reviewed medications with patient and discussed importance of adhering to medication regimen. Reviewed scheduled/upcoming provider appointments including cataract removal 01/11/22 (left eye). & 01/20/22 follow up with GI MD, [name].  Patient had successful right eye cataract removal in January 2023.  Patient states her right eye vision is much clearer. Patient requested help with finding Dental Care covered under her Well Care Medicaid Insurance.  Referral made to [place] for Dental Care resources available with Well Care Medicaid Insurance.  Hypertension: (Status: Goal on Track (progressing): YES.) Last practice recorded BP readings:   BP Readings from Last 3 Encounters:  09/08/21 134/60  08/13/21 120/80  07/23/21 132/61    Most recent eGFR/CrCl: No results found for: EGFR  No components found for: CRCL  Evaluation of current treatment plan related to hypertension self management and patient's adherence to plan as established by provider.   Reviewed prescribed diet : low sodium diet.  Patient continues to adhere to not adding any salt to food or cooking. Reviewed  medications with patient and discussed importance of compliance; Patient continues to is only taking Spironolactone at this time.  Provided assistance in the past with obtaining home blood pressure monitor via free medicaid plan but patient refused and feels she does not need one at this time.  Discussed if patient is getting her blood pressure outside the home.  Patient reports her blood pressure was "good" at eye doctor's office the day of her cataract surgery. Discussed plans with patient for ongoing care management follow up and provided patient with direct contact information for care management team; Reviewed scheduled/upcoming provider appointments.  Continued education on prescribed diet low sodium diet.  Patient states she does cooking herself and does not use any salt on food.  We discussed the importance of reading food labels for salt content.   Patient Goals/Self-Care Activities: Patient will self administer medications as prescribed as evidenced by self report/primary caregiver report  Patient will attend all scheduled provider appointments as evidenced by clinician review of documented attendance to scheduled appointments and patient/caregiver report Patient will call pharmacy for medication refills as evidenced by patient report and review of pharmacy fill history as appropriate Patient will continue to perform ADL's independently as evidenced by patient/caregiver report Patient will continue to perform IADL's independently as evidenced by patient/caregiver report Patient will call provider office for new concerns or questions as evidenced by review of documented incoming telephone call notes and patient report - learn about high blood pressure - keep all doctor appointments - take medications for blood pressure exactly as prescribed - report new symptoms to your doctor - limit salt intake to 2000 mg/day       Follow up:  Patient agrees to Care Plan and Follow-up.  Plan: The  Managed Medicaid care management team will reach out to the patient again over the next 15 days.  Date/time of next scheduled Social Work care management/care coordination outreach:  01/20/22  [name], Arita Miss, [place] Medicaid Team  [phone]

## 2021-12-30 NOTE — Patient Instructions (Signed)
Visit Information  Samantha Evans was given information about Medicaid Managed Care team care coordination services as a part of their [facility] Medicaid benefit. Samantha Evans verbally consented to engagement with the [facility] Managed Care team.   If you are experiencing a medical emergency, please call 911 or report to your local emergency department or urgent care.   If you have a non-emergency medical problem during routine business hours, please contact your provider's office and ask to speak with a nurse.   For questions related to your [facility] health plan, please call: [phone] or go here:[email or web address]  If you would like to schedule transportation through your [facility] plan, please call the following number at least 2 days in advance of your appointment: [phone].  Call the [place] at [phone], at any time, 24 hours a day, 7 days a week. If you are in danger or need immediate medical attention call 911.  If you would like help to quit smoking, call 1-800-QUIT-NOW 3347794832) OR Espaol: 1-855-Djelo-Ya (9-532-023-3435) o para ms informacin haga clic aqu or Text READY to 200-400 to register via text  Ms. Samantha Evans - following are the goals we discussed in your visit today:   Goals Addressed   None     Social Worker will follow up in 15 days.   Samantha Evans, BSW, [place]  High Risk Managed Medicaid Team  [phone]   Following is a copy of your plan of care:  Care Plan : [facility] Plan of Care  Updates made by Samantha Evans since 12/30/2021 12:00 AM     Problem: Care Management and Care Coordination Needs for HTN, Hepatitis C and reoccurring hernia issues      Long-Range Goal: RNCM Plan of Care for Care Management and Care Coordination Needs (HTN, Hepatitis C, Reoccuring Hernia Issues)   Start Date: 07/22/2021  Expected End Date: 02/12/2022  Priority: High  Note:    Current Barriers:  Knowledge Deficits related to plan of care for management of HTN and Hepatitis C and reoccurring hernia issues  Care Coordination needs related to Limited social support, Transportation, Medication procurement, Social Isolation, and Limited access to caregiver  Chronic Disease Management support and education needs related to HTN and Hepatitis C and reoccurring hernia issues Lacks caregiver support.        Non-adherence to scheduled provider appointments Non-adherence to prescribed medication regimen Difficulty obtaining medications No Advanced Directives in place Transportation Barrier being addressed by [name]   RNCM Clinical Goal(s):  Patient will verbalize understanding of plan for management of HTN and Hepatitis C and reoccurring hernia issues as evidenced by patient verbalization of understanding of HTN, Hepatitis C and hernia issues and when to report changes to PCP. verbalize basic understanding of HTN and Hepatitis C and reoccurring hernia issues disease process and self health management plan as evidenced by improved control of diseases. take all medications exactly as prescribed and will call provider for medication related questions as evidenced by improved control of diseases    attend all scheduled medical appointments:  Left eye cataract removal surgery 01/01/2022; [name] (GI) 01/20/2022 as evidenced by attending all scheduled appointments        demonstrate ongoing adherence to prescribed treatment plan for HTN and Hepatitis C and reoccurring hernia issues as evidenced by improved control of diseases. continue to work with Consulting civil engineer and/or Social Worker to address care management and care coordination needs related to HTN  and Hepatitis C and reoccurring hernia issues as evidenced by adherence to CM Team Scheduled appointments     work with Education officer, museum to address Lacks knowledge of community resource: Dental Care covered bu Well Care  Medicaid Insurance related to the management of HTN and Hepatitis C and Reoccurring hernia issues as evidenced by review of EMR and patient or Education officer, museum report     through collaboration with Consulting civil engineer, provider, and care team.   Interventions: Inter-disciplinary care team collaboration (see longitudinal plan of care) Evaluation of current treatment plan related to  self management and patient's adherence to plan as established by provider 12/30/21: BSW completed telephone outreach for dental resources. Patient stated she would like resources to mailed. Patient stated she did not need any other resources. BSW will mail letter with resources.    Hepatitis C and Reoccurring Hernia Issues  (Status: Goal on Track (progressing): YES.) Evaluation of current treatment plan related to  Hepatitis C and reoccurring hernia issues. MRI of Liver done on 11/13/2021 with results given to patient by [name].  No significant changes since last MRI.  Plan to re-evaluate in 6 months.  Patient reports less pain overall at hernia area.  Patient states she can feel the hernia present but less concern overall .  Transportation needs addressed and self-management and patient's adherence to plan as established by provider. Discussed plans with patient for ongoing care management follow up and provided patient with direct contact information for care management team Advised patient to report any changes in hernia to PCP; Continue to review education with patient re: pain management for any hernia pain including PRN pain medication use, wearing hernia belt or supportive underwear/clothing to support abdominal area. Reviewed medications with patient and discussed importance of adhering to medication regimen. Reviewed scheduled/upcoming provider appointments including cataract removal 01/11/22 (left eye). & 01/20/22 follow up with GI MD, [name].  Patient had successful right eye cataract removal in January 2023.   Patient states her right eye vision is much clearer. Patient requested help with finding Dental Care covered under her Well Care Medicaid Insurance.  Referral made to [place] for Dental Care resources available with Well Care Medicaid Insurance.  Hypertension: (Status: Goal on Track (progressing): YES.) Last practice recorded BP readings:   BP Readings from Last 3 Encounters:  09/08/21 134/60  08/13/21 120/80  07/23/21 132/61    Most recent eGFR/CrCl: No results found for: EGFR  No components found for: CRCL  Evaluation of current treatment plan related to hypertension self management and patient's adherence to plan as established by provider.   Reviewed prescribed diet : low sodium diet.  Patient continues to adhere to not adding any salt to food or cooking. Reviewed medications with patient and discussed importance of compliance; Patient continues to is only taking Spironolactone at this time.  Provided assistance in the past with obtaining home blood pressure monitor via free medicaid plan but patient refused and feels she does not need one at this time.  Discussed if patient is getting her blood pressure outside the home.  Patient reports her blood pressure was "good" at eye doctor's office the day of her cataract surgery. Discussed plans with patient for ongoing care management follow up and provided patient with direct contact information for care management team; Reviewed scheduled/upcoming provider appointments.  Continued education on prescribed diet low sodium diet.  Patient states she does cooking herself and does not use any salt on food.  We discussed the  importance of reading food labels for salt content.   Patient Goals/Self-Care Activities: Patient will self administer medications as prescribed as evidenced by self report/primary caregiver report  Patient will attend all scheduled provider appointments as evidenced by clinician review of documented attendance to scheduled  appointments and patient/caregiver report Patient will call pharmacy for medication refills as evidenced by patient report and review of pharmacy fill history as appropriate Patient will continue to perform ADL's independently as evidenced by patient/caregiver report Patient will continue to perform IADL's independently as evidenced by patient/caregiver report Patient will call provider office for new concerns or questions as evidenced by review of documented incoming telephone call notes and patient report - learn about high blood pressure - keep all doctor appointments - take medications for blood pressure exactly as prescribed - report new symptoms to your doctor - limit salt intake to 2000 mg/day

## 2022-01-01 DIAGNOSIS — H2512 Age-related nuclear cataract, left eye: Secondary | ICD-10-CM | POA: Diagnosis not present

## 2022-01-01 DIAGNOSIS — Z7689 Persons encountering health services in other specified circumstances: Secondary | ICD-10-CM | POA: Diagnosis not present

## 2022-01-14 DIAGNOSIS — Z419 Encounter for procedure for purposes other than remedying health state, unspecified: Secondary | ICD-10-CM | POA: Diagnosis not present

## 2022-01-14 DIAGNOSIS — Z7689 Persons encountering health services in other specified circumstances: Secondary | ICD-10-CM | POA: Diagnosis not present

## 2022-01-16 ENCOUNTER — Ambulatory Visit: Payer: Medicaid Other | Admitting: Gastroenterology

## 2022-01-16 ENCOUNTER — Other Ambulatory Visit (INDEPENDENT_AMBULATORY_CARE_PROVIDER_SITE_OTHER): Payer: Medicaid Other

## 2022-01-16 ENCOUNTER — Encounter: Payer: Self-pay | Admitting: Gastroenterology

## 2022-01-16 VITALS — BP 150/80 | HR 76 | Ht 61.0 in | Wt 200.0 lb

## 2022-01-16 DIAGNOSIS — Z7689 Persons encountering health services in other specified circumstances: Secondary | ICD-10-CM | POA: Diagnosis not present

## 2022-01-16 DIAGNOSIS — K7460 Unspecified cirrhosis of liver: Secondary | ICD-10-CM

## 2022-01-16 DIAGNOSIS — K766 Portal hypertension: Secondary | ICD-10-CM | POA: Diagnosis not present

## 2022-01-16 DIAGNOSIS — R935 Abnormal findings on diagnostic imaging of other abdominal regions, including retroperitoneum: Secondary | ICD-10-CM

## 2022-01-16 DIAGNOSIS — B1920 Unspecified viral hepatitis C without hepatic coma: Secondary | ICD-10-CM

## 2022-01-16 LAB — CBC WITH DIFFERENTIAL/PLATELET
Basophils Absolute: 0 10*3/uL (ref 0.0–0.1)
Basophils Relative: 0.5 % (ref 0.0–3.0)
Eosinophils Absolute: 0.2 10*3/uL (ref 0.0–0.7)
Eosinophils Relative: 4.7 % (ref 0.0–5.0)
HCT: 32.5 % — ABNORMAL LOW (ref 36.0–46.0)
Hemoglobin: 11.2 g/dL — ABNORMAL LOW (ref 12.0–15.0)
Lymphocytes Relative: 25.8 % (ref 12.0–46.0)
Lymphs Abs: 1 10*3/uL (ref 0.7–4.0)
MCHC: 34.4 g/dL (ref 30.0–36.0)
MCV: 86.9 fl (ref 78.0–100.0)
Monocytes Absolute: 0.4 10*3/uL (ref 0.1–1.0)
Monocytes Relative: 10.2 % (ref 3.0–12.0)
Neutro Abs: 2.2 10*3/uL (ref 1.4–7.7)
Neutrophils Relative %: 58.8 % (ref 43.0–77.0)
Platelets: 114 10*3/uL — ABNORMAL LOW (ref 150.0–400.0)
RBC: 3.73 Mil/uL — ABNORMAL LOW (ref 3.87–5.11)
RDW: 15.1 % (ref 11.5–15.5)
WBC: 3.7 10*3/uL — ABNORMAL LOW (ref 4.0–10.5)

## 2022-01-16 LAB — COMPREHENSIVE METABOLIC PANEL
ALT: 13 U/L (ref 0–35)
AST: 26 U/L (ref 0–37)
Albumin: 3.9 g/dL (ref 3.5–5.2)
Alkaline Phosphatase: 114 U/L (ref 39–117)
BUN: 14 mg/dL (ref 6–23)
CO2: 29 mEq/L (ref 19–32)
Calcium: 11.1 mg/dL — ABNORMAL HIGH (ref 8.4–10.5)
Chloride: 107 mEq/L (ref 96–112)
Creatinine, Ser: 0.86 mg/dL (ref 0.40–1.20)
GFR: 73.09 mL/min (ref >60.00)
Glucose, Bld: 88 mg/dL (ref 70–99)
Potassium: 4 mEq/L (ref 3.5–5.1)
Sodium: 141 mEq/L (ref 135–145)
Total Bilirubin: 0.8 mg/dL (ref 0.2–1.2)
Total Protein: 7.4 g/dL (ref 6.0–8.3)

## 2022-01-16 LAB — PROTIME-INR
INR: 1.2 ratio — ABNORMAL HIGH (ref 0.8–1.0)
Prothrombin Time: 12.6 s (ref 9.6–13.1)

## 2022-01-16 NOTE — Progress Notes (Signed)
? ?Referring Provider: [name], MD ?Primary Care Physician:  [name], MD ? ?Follow-up:  Cirrhosis ? ? 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   - last seen 07/2021 ?Abnormal liver imaging  ?   - CT 4/22: 2.1 cm density in the right hepatic lobe ?   - MRI 02/27/21 - likely perfusion anomaly, repeat in 6 months recommended ?   - MRI 11/13/21 - persistent abnormality LI-RADs 3, repeat in 6 months recommended ?Abnormal pancreatic imaging ?   - MRI 11/13/21: enlarging cystic lesion at the junction of the head and body of the pancreas ?Mildly elevated AFP 12 03/06/21 ?LE edema on diuretics, no history of ascites by exam or imaging ?History of colon polyps ?   - Surveillance colonoscopy recommended 1 year ?   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 ?PLAN: ?- 2000 mg sodium restricted diet ?- Daily exercise recommended with strength exercises/toning ?- Continue aldactone 50 mg daily ?- CMP, CBC, INR, AFP, Ca-19-9 ?- MRI with MRCP 04/2022 ?- Office follow-up in 3-5 months ?- I asked her to discuss vaccines with ID team to insure complete treatment for HCV ?- Surveillance colonoscopy 9/23 ? ? ?Please see the "Patient Instructions" section for addition details about the plan. ? ?HPI: Samantha Evans is a 61 y.o. female who returns in follow-up.  She was last seen 09/08/21. The interval history is obtained through the patient and review of her electronic health record. She has cirrhosis due to HCV. Umbilical hernia repair 1/91/47. CT at that time showed cirrhosis and a 2.1cm density in the  right hepatic lobe, trace ascites, and splenomegaly. Follow-up MRI showed no HCC but there was a perfusion anomaly.  MRI 11/13/2021 showed cirrhosis with signs of portal hypertension including splenomegaly, numerous hepatic cysts and peribiliary cysts, a stable but persistent foci along the liver capsule suspicious for a lie RADS category 3 lesion, cystic lesions in the head of the pancreas that are unchanged, and an enlarging cystic lesion at the junction of the head and body of the pancreas.  The radiologist suggested MRI with MRCP, next interval follow-up.  Labs from October 2022 show a MELD score of 11.  ? ?She returns today in scheduled follow-up.  She had no new complaints or concerns at this time. Has a knot at her umbilical hernia every morning that she reduces. She has had a great response to bilateral cataract surgery. Has not been by ID since 9/22. Last HCV labs 6/22. ? ?Endoscopic history: ?- EGD 05/20/21: 3 colums of grade 1 esophageal varices, severe portal hypertensive gastropathy ?- Screening colonoscopy 7/27/ 22: One 15 mm polyp in the sigmoid colon with high grade dysplasia and unclear margins. One 3 mm tubular adenoma in the sigmoid colon. 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 Path results not available.  ?- Flexible sigmoidoscopy 07/18/21: Tubular adenoma at 40 cm, inflammatory polyp at 28 cm, detached necroinflammatory debris at 16 cm, surveillance recommended in 1 year given concerns for residual polyp ? ? ?Past Medical History:  ?Diagnosis Date  ? Arthritis   ? Carpal tunnel syndrome   ? Cataracts, bilateral   ?  Cirrhosis ([place])   ? Hypertension   ? ? ?Past Surgical History:  ?Procedure Laterality Date  ? CATARACT EXTRACTION Bilateral   ? ERCP    ? UMBILICAL HERNIA REPAIR N/A 02/27/2021  ? Procedure: REPAIR INCARCERATED UMBILICAL HERNIA;  Surgeon: Georganna Skeans, MD;  Location: [facility];  Service: General;  Laterality: N/A;  ? ? ?Current Outpatient Medications  ?Medication Sig Dispense Refill  ?  psyllium (METAMUCIL SMOOTH TEXTURE) 58.6 % powder Take 1 packet by mouth daily. (Patient taking differently: Take 1 packet by mouth daily as needed.) 283 g 12  ? Sofosbuvir-Velpatasvir ([facility]) 400-100 MG TABS Take 1 tablet by mouth daily. 28 tablet 5  ? spironolactone (ALDACTONE) 50 MG tablet Take 1 tablet (50 mg total) by mouth daily. (Patient not taking: Reported on 01/16/2022) 30 tablet 5  ? ?No current facility-administered medications for this visit.  ? ? ?Allergies as of 01/16/2022  ? (No Known Allergies)  ? ? ? ?Physical Exam: ?General:   Alert, in NAD. No scleral icterus. No bilateral temporal wasting.  ?Heart:  Regular rate and rhythm; no murmurs ?Pulm: Clear anteriorly; no wheezing ?Abdomen:  Soft. Nontender. Nondistended. Normal bowel sounds. No rebound or guarding. No fluid wave.  ?LAD: No inguinal or umbilical LAD ?Extremities:  1-2+ LE edema. ?Neurologic:  Alert and  oriented x4;  grossly normal neurologically; no asterixis or clonus. ?Skin: No jaundice. Spider angioma on the chest wall. No palmar erythema.  ?Psych:  Alert and cooperative. Normal mood and affect.  ? ? ?Welby Montminy L. Tarri Glenn, MD, MPH ?01/16/2022, 3:40 PM ? ? ? ?  ?

## 2022-01-16 NOTE — Patient Instructions (Addendum)
It was my pleasure to provide care to you today. Based on our discussion, I am providing you with my recommendations below: ? ?RECOMMENDATION(S):  ? ?Keep 2000 mg sodium restricted diet ? ?Daily exercise recommended with strength exercises/toning ? ?Referral placed back to infectious disease.  ? ?LABS:  ? ?Please proceed to the basement level for lab work before leaving today. Press "B" on the elevator. The lab is located at the first door on the left as you exit the elevator. ? ?HEALTHCARE LAWS AND MY CHART RESULTS:  ? ?Due to recent changes in healthcare laws, you may see results of your imaging and/or laboratory studies on MyChart before I have had a chance to review them.  I understand that in some cases there may be results that are confusing or concerning to you. Please understand that not all results are received at the same time and often I may need to interpret multiple results in order to provide you with the best plan of care or course of treatment. Therefore, I ask that you please give me 48 hours to thoroughly review all your results before contacting my office for clarification.  ? ?FOLLOW UP: ? ?Once we receive your blood test results, you will receive a call from my office staff regarding my recommendation for follow up. ? ?BMI: ? ?If you are age 42 or younger, your body mass index should be between 19-25. Your Body mass index is 37.79 kg/m?Marland Kitchen If this is out of the aformentioned range listed, please consider follow up with your Primary Care Provider.  ? ?MY CHART: ? ?The [facility] providers would like to encourage you to use [facility] to communicate with providers for non-urgent requests or questions.  Due to long hold times on the telephone, sending your provider a message by [facility] may be a faster and more efficient way to get a response.  Please allow 48 business hours for a response.  Please remember that this is for non-urgent requests.  ? ?Thank you for trusting me with your gastrointestinal  care!   ? ?[name], MD, MPH ? ?

## 2022-01-19 ENCOUNTER — Other Ambulatory Visit: Payer: Self-pay

## 2022-01-19 LAB — CANCER ANTIGEN 19-9: CA 19-9: 3 U/mL (ref <34)

## 2022-01-19 LAB — AFP TUMOR MARKER: AFP-Tumor Marker: 5 ng/mL

## 2022-01-19 NOTE — Telephone Encounter (Signed)
Pt still has future orders as follows: ? ?Ca-10-9, AFP, PTH w/ Calcium and CMP: "expected 03/09/22", "expires 05/15/22" ?MRI/MRCP "expected 03/16/22, "expires 06/15/22" - Reminder has been created to ensure this is scheduled in 04/2022 ? ?Appears duplicate orders were placed at the time of pt OV on 01/16/22 and needing to clarify plan to determine if ordered labs as listed above are needing to be d/c'd given this oversight: ? ?- CMP, CBC, INR, AFP, Ca-19-9 - obtained 01/16/22. However awaiting response re: whether the following should be d/c'd: Ca-10-9, AFP, PTH w/ Calcium and CMP: "expected 03/09/22", "expires 05/15/22" ?- MRI with MRCP 04/2022 - reminder created to ensure this is scheduled ?- Office follow-up in 3-5 months - reminder created to ensure pt has been scheduled ? ?Routing this message to [name] for further clarification. ?

## 2022-01-20 ENCOUNTER — Other Ambulatory Visit: Payer: Self-pay

## 2022-01-20 NOTE — Patient Outreach (Signed)
Care Coordination ? ?01/20/2022 ? ?[place] ?1960/12/16 ?[id number] ? ?01/20/2022 ?Name: Samantha Evans MRN: [id number] DOB: 22-Mar-1961 ? ?Referred by: [name], MD ?Reason for referral : High Risk Managed Medicaid (Unsuccessful Telephone Outreach) ? ? ?An unsuccessful telephone outreach was attempted today. The patient was referred to the case management team for assistance with care management and care coordination.   This RN Care Manager attempted three unsuccessful telephone outreaches today and was unable to leave a voicemail message each time.  Patient's telephone voicemail box full. ? ?Follow Up Plan: The Managed Medicaid care management team will reach out to the patient again over the next 7 - 14 days.  ? ? ?Salvatore Marvel RN, BSN ?Community Care Coordinator ?[place] [facility] ?Mobile: [phone]  ?

## 2022-01-20 NOTE — Patient Outreach (Signed)
Care Coordination ? ?01/20/2022 ? ?[place] ?10-16-1961 ?[id number] ? ? ?Medicaid Managed Care  ? ?Unsuccessful Outreach Note ? ?01/20/2022 ?Name: Samantha Evans MRN: [id number] DOB: 10/10/1961 ? ?Referred by: [name], MD ?Reason for referral : High Risk Managed Medicaid (MM social work unsuccessful telephone outreach) ? ? ?An unsuccessful telephone outreach was attempted today. The patient was referred to the case management team for assistance with care management and care coordination.  ? ?Follow Up Plan: The care management team will reach out to the patient again over the next 30 days.  ? ?. ajs  ?

## 2022-01-20 NOTE — Patient Instructions (Signed)
Visit Information ? ?Ms. Jamison Neighbor Kloepfer  - as a part of your Medicaid benefit, you are eligible for care management and care coordination services at no cost or copay. I was unable to reach you by phone today but would be happy to help you with your health related needs. Please feel free to call me @ [phone].  ? ?A member of the Managed Medicaid care management team will reach out to you again over the next 30 days.  ? ?[name], BSW, MHA ?[place]  ?High Risk Managed Medicaid Team  ?[phone]  ?

## 2022-01-20 NOTE — Patient Instructions (Signed)
Jamison Neighbor Candy ,  ? ?The [facility] Managed Care Team is available to provide assistance to you with your healthcare needs at no cost and as a benefit of your [facility] Health plan. ? ?I'm sorry I was unable to reach you today for our scheduled appointment. Our care guide will call you to reschedule our telephone appointment. Please call me at the number below. I am available to be of assistance to you regarding your healthcare needs. .  ? ?Thank you,  ? ?Salvatore Marvel RN, BSN ?Community Care Coordinator ?[place] [facility] ?Mobile: [phone]   ?

## 2022-01-20 NOTE — Telephone Encounter (Signed)
At [name] request, duplicate lab orders discontinued. Reminder created to ensure pt has been scheduled for MRI as per her request. ?

## 2022-01-23 ENCOUNTER — Telehealth: Payer: Self-pay | Admitting: Family Medicine

## 2022-01-23 NOTE — Telephone Encounter (Signed)
.. ?  Medicaid Managed Care  ? ?Unsuccessful Outreach Note ? ?01/23/2022 ?Name: Samantha Evans MRN: [id number] DOB: January 28, 1961 ? ?Referred by: [name], MD ?Reason for referral : High Risk Managed Medicaid (I called the patient today to get her rescheduled with the MM RNCM. She did not answer and her VM was full.) ? ? ?An unsuccessful telephone outreach was attempted today. The patient was referred to the case management team for assistance with care management and care coordination.  ? ?Follow Up Plan: The care management team will reach out to the patient again over the next 7 days.  ? ? ?Reita Chard ?Care Guide, High Risk Medicaid Managed Care ?Embedded Care Coordination ?[place]  ? ? ? ?

## 2022-01-24 ENCOUNTER — Other Ambulatory Visit: Payer: Self-pay | Admitting: Family Medicine

## 2022-02-03 ENCOUNTER — Other Ambulatory Visit: Payer: Medicaid Other

## 2022-02-03 NOTE — Patient Outreach (Signed)
Care Coordination ? ?02/03/2022 ? ?[place] ?1960-12-17 ?[id number] ? ?02/03/2022 ?Name: Samantha Evans MRN: [id number] DOB: 08-14-1961 ? ?Referred by: [name], MD ?Reason for referral : High Risk Managed Medicaid (RNCM: Notification of Case Closure) ? ? ?Third unsuccessful telephone outreach was attempted today. The patient was referred to the case management team for assistance with care management and care coordination. The patient's primary care provider has been notified of our unsuccessful attempts to make or maintain contact with the patient. The care management team is pleased to engage with this patient at any time in the future should he/she be interested in assistance from the care management team.  To clarify, the third unsuccessful telephone outreach attempt occurred on January 23, 2022. ? ?Follow Up Plan: The Managed Medicaid care management team is available to follow up with the patient after provider conversation with the patient regarding recommendation for care management engagement and subsequent re-referral to the care management team.  ? ?Salvatore Marvel RN, BSN ?Community Care Coordinator ?[place] [facility] ?Mobile: [phone]  ?

## 2022-02-03 NOTE — Patient Instructions (Signed)
Samantha Evans ,  ? ?The [facility] Managed Care Team is available to provide assistance to you with your healthcare needs at no cost and as a benefit of your [facility] Health plan.  ? ?We have been unable to reach you on 3 separate attempts. The care management team is available to assist with your healthcare needs at any time. Please do not hesitate to contact me at the number below.   ? ?This is notification of the case closure by [place] Medicaid program due to being unable to maintain contact with the patient.  The Case Closure is effective today 02/03/2022. ? ?Thank you,  ? ?Salvatore Marvel RN, BSN ?Community Care Coordinator ?[place] [facility] ?Mobile: [phone]  ?

## 2022-02-04 DIAGNOSIS — Z7689 Persons encountering health services in other specified circumstances: Secondary | ICD-10-CM | POA: Diagnosis not present

## 2022-02-14 DIAGNOSIS — Z419 Encounter for procedure for purposes other than remedying health state, unspecified: Secondary | ICD-10-CM | POA: Diagnosis not present

## 2022-02-17 ENCOUNTER — Other Ambulatory Visit (HOSPITAL_COMMUNITY): Payer: Self-pay

## 2022-02-17 ENCOUNTER — Ambulatory Visit: Payer: Medicaid Other

## 2022-02-17 ENCOUNTER — Other Ambulatory Visit: Payer: Self-pay

## 2022-02-17 NOTE — Patient Instructions (Signed)
Visit Information ? ?Ms. Wojahn was given information about Medicaid Managed Care team care coordination services as a part of their [facility] Medicaid benefit. Emaleigh Guimond Hopple verbally consented to engagement with the [facility] Managed Care team.  ? ?If you are experiencing a medical emergency, please call 911 or report to your local emergency department or urgent care.  ? ?If you have a non-emergency medical problem during routine business hours, please contact your provider's office and ask to speak with a nurse.  ? ?For questions related to your [facility] health plan, please call: [phone] or go here:[email or web address] ? ?If you would like to schedule transportation through your [facility] [facility] plan, please call the following number at least 2 days in advance of your appointment: [phone]. ? You can also use the MTM portal or MTM mobile app to manage your rides. For the portal, please go to [email or web address]. ? ?Call the [place] at [phone], at any time, 24 hours a day, 7 days a week. If you are in danger or need immediate medical attention call 911. ? ?If you would like help to quit smoking, call 1-800-QUIT-NOW 270-410-2859) OR Espa?ol: 1-855-D?jelo-Ya (702)299-5083) o para m?s informaci?n haga clic aqu? or Text READY to 200-400 to register via text ? ?Ms. Delosreyes - following are the goals we discussed in your visit today:  ? Goals Addressed   ? ?  ?  ?  ?  ? This Visit's Progress  ?  RNCM - Hepatitis Monitored & Managed     ?  Timeframe:  Long-Range Goal ?Priority:  High ?Start Date:  07/22/2021                           ?Expected End Date:   02/12/2022      ? ?Patient Goals:  ? ?Patient will self administer medications as prescribed ?Patient will attend all scheduled provider appointments ?Patient will call pharmacy for medication refills ?Patient will continue to perform ADL's independently ?Patient will continue to perform IADL's independently ?Patient will  call provider office for new concerns or questions           ?  ?  RNCM - Hypertension Monitored & Managed     ?  Timeframe:  Long-Range Goal ?Priority:  High ?Start Date:  07/22/2021                           ?Expected End Date:  02/12/2022    ? ?Patient Goals: ?Patient will self administer medications as prescribed ?Patient will attend all scheduled provider appointments ?Patient will call pharmacy for medication refills ?Patient will continue to perform ADL's independently ?Patient will continue to perform IADL's independently ?Patient will call provider office for new concerns or questions   ?- learn about high blood pressure ?- keep all doctor appointments ?- take medications for blood pressure exactly as prescribed ?- report new symptoms to your doctor ?- limit salt intake to 2000 mg/day        ?  ?  RNCM - Reoccurring Hernia Issues Monitored & Managed     ?  Timeframe:  Long-Range Goal ?Priority:  High ?Start Date: 07/22/2021                           ?Expected End Date: 02/12/2022    ? ?Patient Goals: ? ? *    Patient will self administer medications  as prescribed ?Patient will attend all scheduled provider appointments ?Patient will call pharmacy for medication refills ?Patient will continue to perform ADL's independently ?Patient will continue to perform IADL's independently ?Patient will call provider office for new concerns or questions              ?  ? ?  ? ? ?Please see education materials related to today's visit provided by MyChart link. ? ?Patient verbalizes understanding of instructions and care plan provided today and agrees to view in [place]. Active MyChart status confirmed with patient.   ? ?The Managed Medicaid care management team will reach out to the patient again over the next 30 days.  ? ?Salvatore Marvel RN, BSN ?Community Care Coordinator ?[place] [facility] ?Mobile: [phone]  ? ?Following is a copy of your plan of care:  ?Care Plan : [place]  ?Updates made  by Inge Rise, RN since 02/17/2022 12:00 AM  ?  ? ?Problem: Care Management and Care Coordination Needs for HTN, Hepatitis C and reoccurring hernia issues   ?  ? ?Long-Range Goal: RNCM Plan of Care for Care Management and Care Coordination Needs (HTN, Hepatitis C, Reoccuring Hernia Issues)   ?Start Date: 07/22/2021  ?Expected End Date: 02/12/2022  ?Priority: High  ?Note:   ?Current Barriers:  ?Knowledge Deficits related to plan of care for management of HTN and Hepatitis C and reoccurring hernia issues  ?Care Coordination needs related to Limited social support, Transportation, Medication procurement, Social Isolation, and Limited access to caregiver  ?Chronic Disease Management support and education needs related to HTN and Hepatitis C and reoccurring hernia issues ?Lacks caregiver support.        ?Non-adherence to scheduled provider appointments ?Non-adherence to prescribed medication regimen ?Difficulty obtaining medications ?No Advanced Directives in place ?Transportation Barrier being addressed by [name] ? ? ?RNCM Clinical Goal(s):  ?Patient will verbalize understanding of plan for management of HTN and Hepatitis C and reoccurring hernia issues as evidenced by patient verbalization of understanding of HTN, Hepatitis C and hernia issues and when to report changes to PCP. ?verbalize basic understanding of HTN and Hepatitis C and reoccurring hernia issues disease process and self health management plan as evidenced by improved control of diseases. ?take all medications exactly as prescribed and will call provider for medication related questions as evidenced by improved control of diseases    ?attend all scheduled medical appointments: 02/24/2022 ID visit; 03/16/2022 Follow up MRI as evidenced by attending all scheduled appointments        ?demonstrate ongoing adherence to prescribed treatment plan for HTN and Hepatitis C and reoccurring hernia issues as evidenced by improved control of  diseases. ?continue to work with Consulting civil engineer and/or Social Worker to address care management and care coordination needs related to HTN and Hepatitis C and reoccurring hernia issues as evidenced by adherence to CM Team Scheduled appointments     ?work with Education officer, museum to address Lacks knowledge of community resource: Dental Care covered by [employer] Medicaid Insurance related to the management of HTN and Hepatitis C and Reoccurring hernia issues as evidenced by review of EMR and patient or Education officer, museum report     through collaboration with Consulting civil engineer, provider, and care team.  Dental Care resources provided and completed. ? ?Interventions: ?Inter-disciplinary care team collaboration (see longitudinal plan of care) ?Evaluation of current treatment plan related to  self management and patient's adherence to plan as established by provider ?12/30/21: BSW  completed telephone outreach for dental resources. Patient stated she would like resources to mailed. Patient stated she did not need any other resources. BSW will mail letter with resources.  ? ? ?Hepatitis C and Reoccurring Hernia Issues  (Status: Goal on Track (progressing): YES.) ?Evaluation of current treatment plan related to  Hepatitis C and reoccurring hernia issues. Follow up MRI of the liver ordered by [name] scheduled for 03/16/2022.   Patient reports less pain overall at hernia area.  Patient states she can feel and hear the hernia's motility every morning but patient able to reduce it herself.  Patient using Ibuprofen 2 tabs BID max.  Patient uses Icey Hot cream for arthritic pain at wrist & ankles.  Marland Kitchen  ?Transportation needs addressed and self-management and patient's adherence to plan as established by provider. ?Discussed plans with patient for ongoing care management follow up and provided patient with direct contact information for care management team ?Advised patient to report any changes in hernia to PCP; ?Advised patient to make an  appointment with ID doctor and discuss medication Epclusa and it's continued use.  Patient currently not taking it. ?Continue to review education with patient re: pain management for any hernia pain including PRN p

## 2022-02-17 NOTE — Patient Outreach (Signed)
?Medicaid Managed Care   ?Nurse Care Manager Note ? ?02/17/2022 ?Name:  Samantha Evans MRN:  [id number] DOB:  Oct 22, 1961 ? ?Samantha Evans is an 61 y.o. year old female who is a primary patient of Samantha Rakes, MD.  The [facility] Managed Care Coordination team was consulted for assistance with:    ?HTN ?Hepatitis C ?Reoccurring Hernia Issues ? ?[name] was given information about Medicaid Managed Care Coordination team services today. [place] Patient agreed to services and verbal consent obtained. ? ?Engaged with patient by telephone for follow up visit in response to provider referral for case management and/or care coordination services.  ? ?Assessments/Interventions:  Review of past medical history, allergies, medications, health status, including review of consultants reports, laboratory and other test data, was performed as part of comprehensive evaluation and provision of chronic care management services. ? ?SDOH (Social Determinants of Health) assessments and interventions performed: ? ? ?Care Plan ? ?No Known Allergies ? ?Medications Reviewed Today   ? ? Reviewed by Inge Rise, RN (Case Manager) on 02/17/22 at [place] List Status: <None>  ? ?Medication Order Taking? Sig Documenting Provider Last Dose Status Informant  ?psyllium (METAMUCIL SMOOTH TEXTURE) 58.6 % powder [id number]  Take 1 packet by mouth daily.  ?Patient taking differently: Take 1 packet by mouth daily as needed.  ? [name], MD  Active   ?Sofosbuvir-Velpatasvir (EPCLUSA) 400-100 MG TABS [id number]  Take 1 tablet by mouth daily. Kuppelweiser, [name], RPH-CPP  Active   ?spironolactone (ALDACTONE) 50 MG tablet [id number] Yes Take 1 tablet (50 mg total) by mouth daily. [name], MD Taking Active   ? ?  ?  ? ?  ? ? ?Patient Active Problem List  ? Diagnosis Date Noted  ? Peripheral edema 05/12/2021  ? Chronic hepatitis C without hepatic coma ([place]) 03/25/2021  ? Acid reflux 03/25/2021  ? Incarcerated umbilical  hernia 33/54/5625  ? Small bowel obstruction ([place]) 02/27/2021  ? Hepatic cirrhosis ([place])- by CT scan 02/27/2021  ? AKI (acute kidney injury) ([place]) 02/27/2021  ? ? ?Conditions to be addressed/monitored per PCP order:  HTN and Hepatitis C and Reoccurring Hernia Issues ? ?Care Plan : RNCM Plan of Care  ?Updates made by Inge Rise, RN since 02/17/2022 12:00 AM  ?  ? ?Problem: Care Management and Care Coordination Needs for HTN, Hepatitis C and reoccurring hernia issues   ?  ? ?Long-Range Goal: RNCM Plan of Care for Care Management and Care Coordination Needs (HTN, Hepatitis C, Reoccuring Hernia Issues)   ?Start Date: 07/22/2021  ?Expected End Date: 02/12/2022  ?Priority: High  ?Note:   ?Current Barriers:  ?Knowledge Deficits related to plan of care for management of HTN and Hepatitis C and reoccurring hernia issues  ?Care Coordination needs related to Limited social support, Transportation, Medication procurement, Social Isolation, and Limited access to caregiver  ?Chronic Disease Management support and education needs related to HTN and Hepatitis C and reoccurring hernia issues ?Lacks caregiver support.        ?Non-adherence to scheduled provider appointments ?Non-adherence to prescribed medication regimen ?Difficulty obtaining medications ?No Advanced Directives in place ?Transportation Barrier being addressed by [name] ? ? ?RNCM Clinical Goal(s):  ?Patient will verbalize understanding of plan for management of HTN and Hepatitis C and reoccurring hernia issues as evidenced by patient verbalization of understanding of HTN, Hepatitis C and hernia issues and when to report changes to PCP. ?verbalize basic understanding of HTN and Hepatitis C and reoccurring hernia  issues disease process and self health management plan as evidenced by improved control of diseases. ?take all medications exactly as prescribed and will call provider for medication related questions as evidenced by improved control of  diseases    ?attend all scheduled medical appointments: 02/24/2022 ID visit; 03/16/2022 Follow up MRI as evidenced by attending all scheduled appointments        ?demonstrate ongoing adherence to prescribed treatment plan for HTN and Hepatitis C and reoccurring hernia issues as evidenced by improved control of diseases. ?continue to work with Consulting civil engineer and/or Social Worker to address care management and care coordination needs related to HTN and Hepatitis C and reoccurring hernia issues as evidenced by adherence to CM Team Scheduled appointments     ?work with Education officer, museum to address Lacks knowledge of community resource: Dental Care covered by Well Care Medicaid Insurance related to the management of HTN and Hepatitis C and Reoccurring hernia issues as evidenced by review of EMR and patient or Education officer, museum report     through collaboration with Consulting civil engineer, provider, and care team.  Dental Care resources provided and completed. ? ?Interventions: ?Inter-disciplinary care team collaboration (see longitudinal plan of care) ?Evaluation of current treatment plan related to  self management and patient's adherence to plan as established by provider ?12/30/21: BSW completed telephone outreach for dental resources. Patient stated she would like resources to mailed. Patient stated she did not need any other resources. BSW will mail letter with resources.  ? ? ?Hepatitis C and Reoccurring Hernia Issues  (Status: Goal on Track (progressing): YES.) ?Evaluation of current treatment plan related to  Hepatitis C and reoccurring hernia issues. Follow up MRI of the liver ordered by [name] scheduled for 03/16/2022.   Patient reports less pain overall at hernia area.  Patient states she can feel and hear the hernia's motility every morning but patient able to reduce it herself.  Patient using Ibuprofen 2 tabs BID max.  Patient uses Icey Hot cream for arthritic pain at wrist & ankles.  Marland Kitchen  ?Transportation needs addressed and  self-management and patient's adherence to plan as established by provider. ?Discussed plans with patient for ongoing care management follow up and provided patient with direct contact information for care management team ?Advised patient to report any changes in hernia to PCP; ?Advised patient to make an appointment with ID doctor and discuss medication Epclusa and it's continued use.  Patient currently not taking it. ?Continue to review education with patient re: pain management for any hernia pain including PRN pain medication use, wearing hernia belt or supportive underwear/clothing to support abdominal area. ?Reviewed medications with patient and discussed importance of adhering to medication regimen. ?Reviewed scheduled/upcoming provider appointments including 02/24/2022 ID visit; Follow up MRI 03/16/2022 ?Patient requested help with finding Dental Care covered under her Well Care Medicaid Insurance.  Referral made to [place] for Dental Care resources available with Well Care Medicaid Insurance.  This has been completed. ? ?Hypertension: (Status: Goal on Track (progressing): YES.) ?Last practice recorded BP readings:  ?BP Readings from Last 3 Encounters:  ?01/16/22 (!) 150/80  ?09/08/21 134/60  ?08/13/21 120/80  ?  ?Most recent eGFR/CrCl: No results found for: EGFR  No components found for: CRCL ? ?Evaluation of current treatment plan related to hypertension self management and patient's adherence to plan as established by provider.   ?Reviewed prescribed diet: low sodium diet. Continued to provide education on 2000 mg sodium diet and the importance of reading food labels  when making food selections.  Patient continues to adhere to no added salt to food items or when cooking.  Provided education on high sodium food items such as frozen meals and lunch meats.  Patient verbalized understanding. ?Reviewed medications with patient and discussed importance of compliance; Patient continues to is only taking  Spironolactone at this time.  ?Provided assistance in the past with obtaining home blood pressure monitor via free medicaid plan but patient refused and felt she did not need one.  Patient now open to the idea of gettin

## 2022-02-18 ENCOUNTER — Ambulatory Visit: Payer: Medicaid Other

## 2022-02-24 ENCOUNTER — Ambulatory Visit: Payer: Medicaid Other | Admitting: Infectious Diseases

## 2022-03-13 ENCOUNTER — Emergency Department (HOSPITAL_COMMUNITY)
Admission: EM | Admit: 2022-03-13 | Discharge: 2022-03-13 | Disposition: A | Discharge status: Home Health | Payer: Medicaid Other | Attending: Emergency Medicine | Admitting: Emergency Medicine

## 2022-03-13 ENCOUNTER — Emergency Department (HOSPITAL_COMMUNITY): Payer: Medicaid Other

## 2022-03-13 ENCOUNTER — Other Ambulatory Visit: Payer: Self-pay

## 2022-03-13 ENCOUNTER — Encounter (HOSPITAL_COMMUNITY): Payer: Self-pay

## 2022-03-13 DIAGNOSIS — K439 Ventral hernia without obstruction or gangrene: Secondary | ICD-10-CM | POA: Insufficient documentation

## 2022-03-13 DIAGNOSIS — Z87891 Personal history of nicotine dependence: Secondary | ICD-10-CM | POA: Insufficient documentation

## 2022-03-13 DIAGNOSIS — R1033 Periumbilical pain: Secondary | ICD-10-CM | POA: Diagnosis present

## 2022-03-13 DIAGNOSIS — Z79899 Other long term (current) drug therapy: Secondary | ICD-10-CM | POA: Diagnosis not present

## 2022-03-13 DIAGNOSIS — I10 Essential (primary) hypertension: Secondary | ICD-10-CM | POA: Diagnosis not present

## 2022-03-13 LAB — LIPASE, BLOOD: Lipase: 48 U/L (ref 11–51)

## 2022-03-13 LAB — URINALYSIS, ROUTINE W REFLEX MICROSCOPIC
Bacteria, UA: NONE SEEN
Bilirubin Urine: NEGATIVE
Glucose, UA: NEGATIVE mg/dL
Ketones, ur: NEGATIVE mg/dL
Nitrite: NEGATIVE
Protein, ur: 30 mg/dL — AB
Specific Gravity, Urine: 1.018 (ref 1.005–1.030)
pH: 5 (ref 5.0–8.0)

## 2022-03-13 LAB — CBC
HCT: 36 % (ref 36.0–46.0)
Hemoglobin: 11.8 g/dL — ABNORMAL LOW (ref 12.0–15.0)
MCH: 28.5 pg (ref 26.0–34.0)
MCHC: 32.8 g/dL (ref 30.0–36.0)
MCV: 87 fL (ref 80.0–100.0)
Platelets: 120 10*3/uL — ABNORMAL LOW (ref 150–400)
RBC: 4.14 MIL/uL (ref 3.87–5.11)
RDW: 15 % (ref 11.5–15.5)
WBC: 5 10*3/uL (ref 4.0–10.5)
nRBC: 0 % (ref 0.0–0.2)

## 2022-03-13 LAB — COMPREHENSIVE METABOLIC PANEL
ALT: 17 U/L (ref 0–44)
AST: 30 U/L (ref 15–41)
Albumin: 3.4 g/dL — ABNORMAL LOW (ref 3.5–5.0)
Alkaline Phosphatase: 108 U/L (ref 38–126)
Anion gap: 5 (ref 5–15)
BUN: 19 mg/dL (ref 8–23)
CO2: 23 mmol/L (ref 22–32)
Calcium: 10.8 mg/dL — ABNORMAL HIGH (ref 8.9–10.3)
Chloride: 107 mmol/L (ref 98–111)
Creatinine, Ser: 0.87 mg/dL (ref 0.44–1.00)
GFR, Estimated: >60 mL/min (ref >60)
Glucose, Bld: 107 mg/dL — ABNORMAL HIGH (ref 70–99)
Potassium: 4 mmol/L (ref 3.5–5.1)
Sodium: 135 mmol/L (ref 135–145)
Total Bilirubin: 0.7 mg/dL (ref 0.3–1.2)
Total Protein: 7 g/dL (ref 6.5–8.1)

## 2022-03-13 IMAGING — CT CT ABD-PELV W/ CM
2 of 5 series · 16 of 46 positions shown, 18 images · IV contrast (agent unspecified)
Comparison: MRI [DATE], CT [DATE]

CLINICAL DATA: Abdominal pain. Concern for incarcerated ventral
hernia

EXAM:
CT ABDOMEN AND PELVIS WITH CONTRAST
TECHNIQUE: Multidetector CT imaging of the abdomen and pelvis was performed
using the standard protocol following bolus administration of
intravenous contrast.

[Series 3: abd/ pelvis 5.0 i30f 2 · axial · 0.85mm/px · z∈[+864,+1264]mm · 13 of 92 slices shown, 15 images]
[im 6/92  soft-tissue]
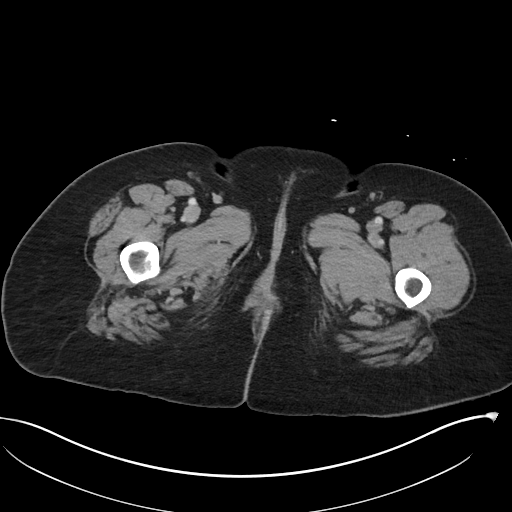
[im 6/92  bone]
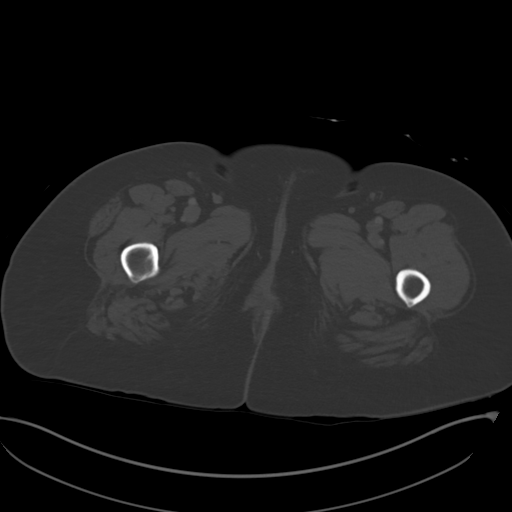
[im 11/92  soft-tissue]
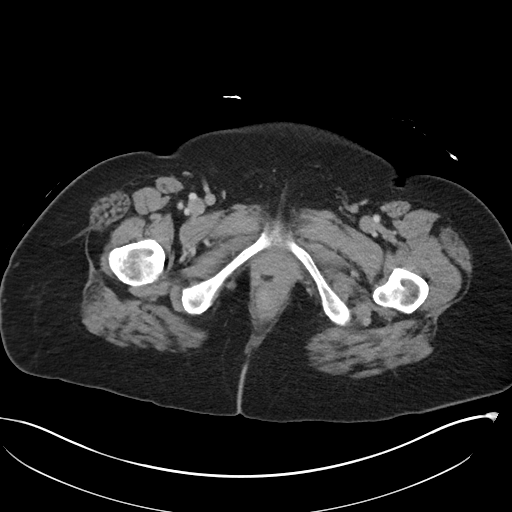
[im 21/92  soft-tissue]
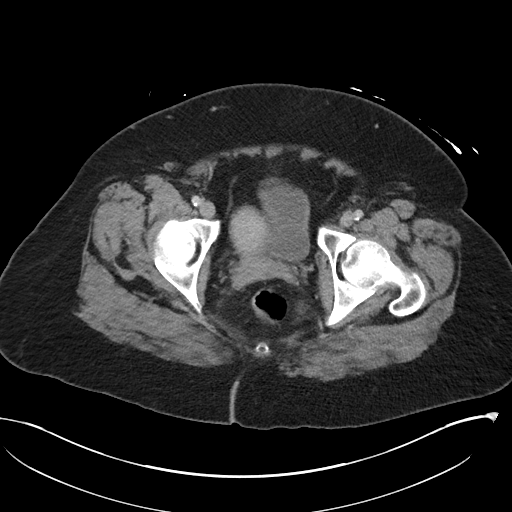
[im 26/92  soft-tissue]
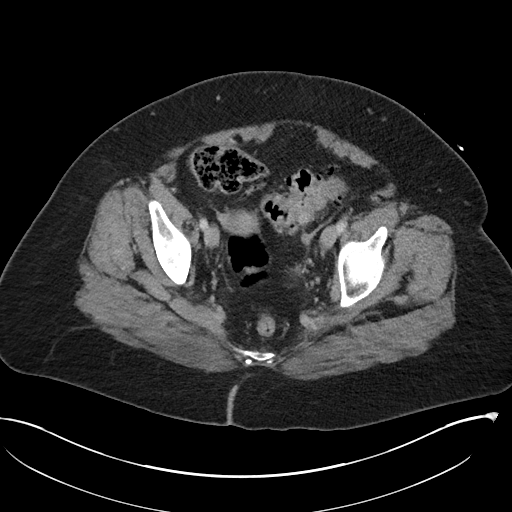
[im 31/92  soft-tissue]
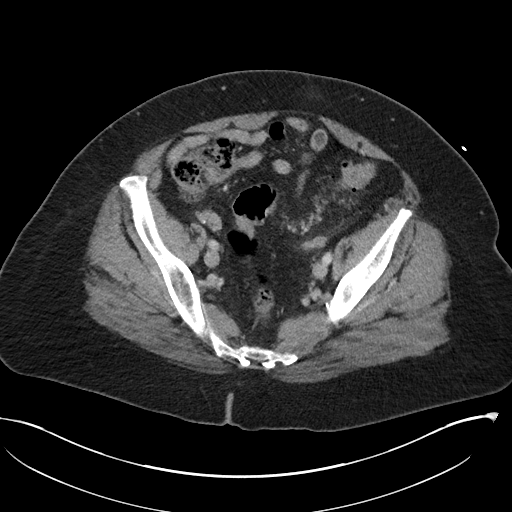
[im 41/92  soft-tissue]
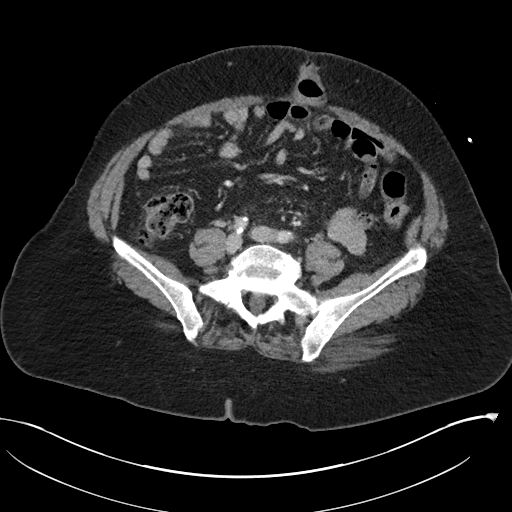
[im 46/92  soft-tissue]
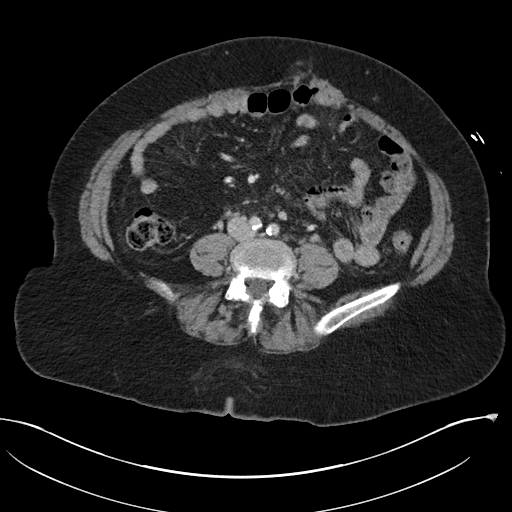
[im 51/92  soft-tissue]
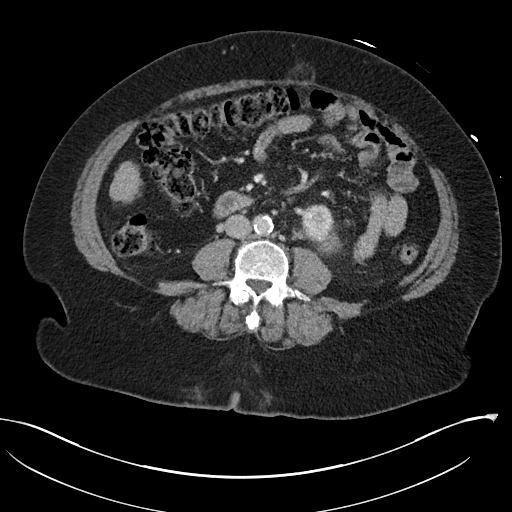
[im 61/92  soft-tissue]
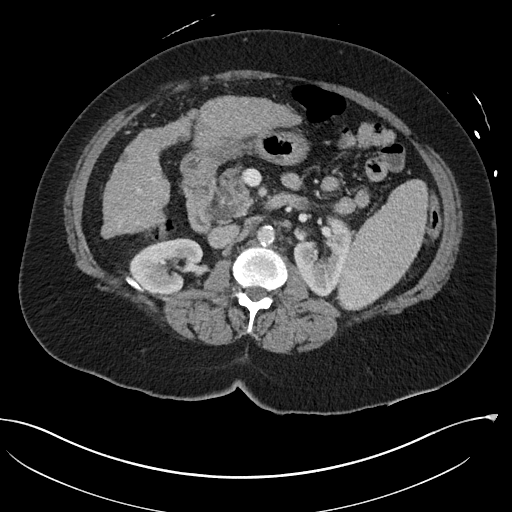
[im 61/92  bone]
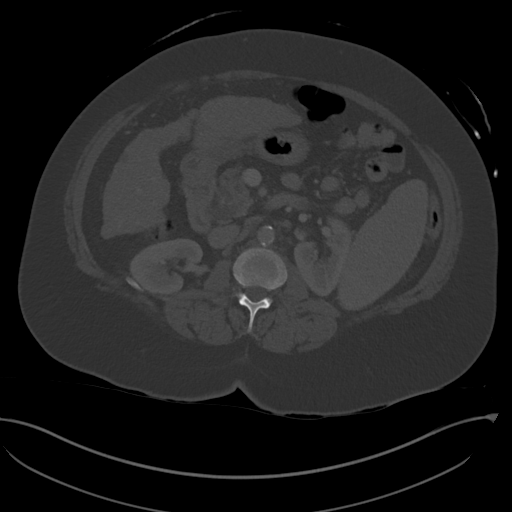
[im 66/92  soft-tissue]
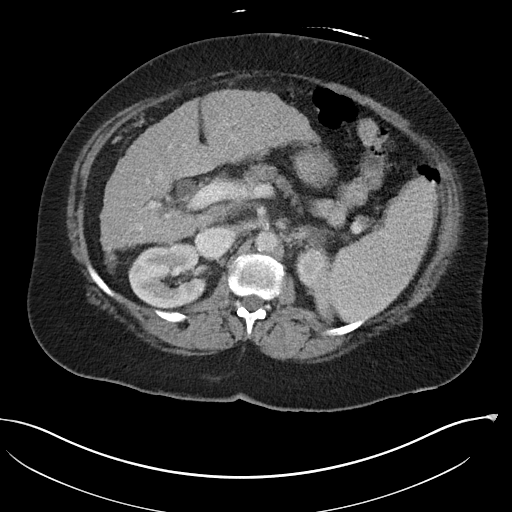
[im 71/92  soft-tissue]
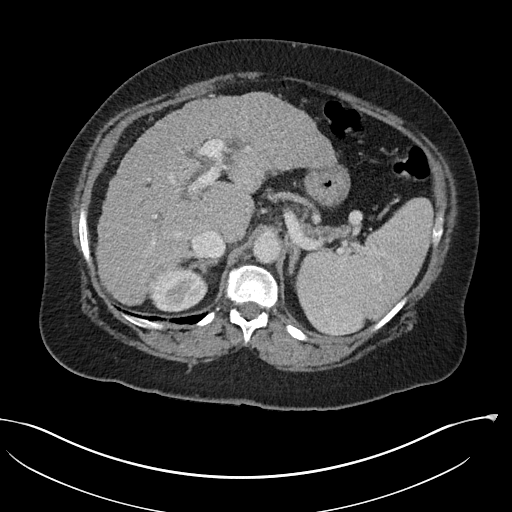
[im 81/92  soft-tissue]
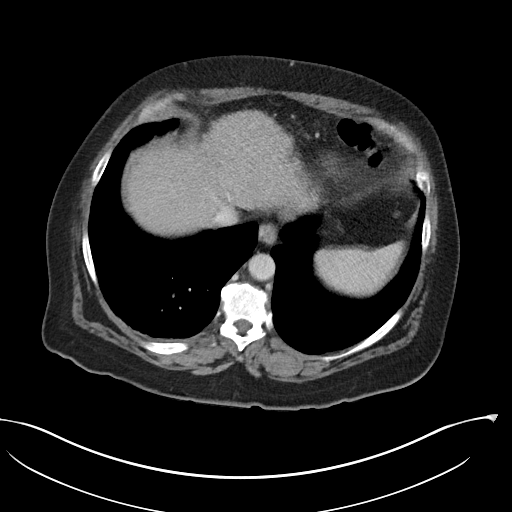
[im 86/92  soft-tissue]
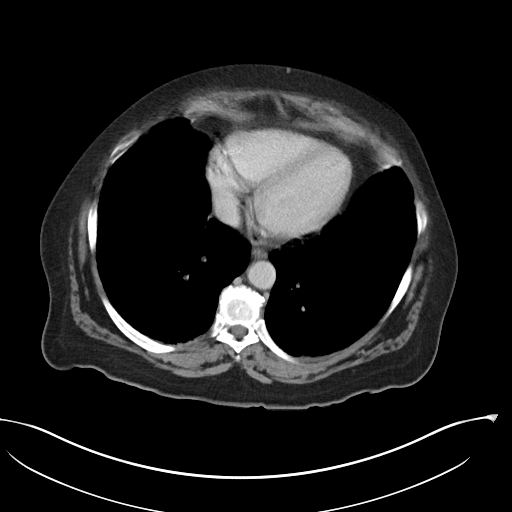

[Series 6: coronal soft tissue · coronal · 0.90mm/px · 3 of 113 slices shown]
[im 38/113  soft-tissue]
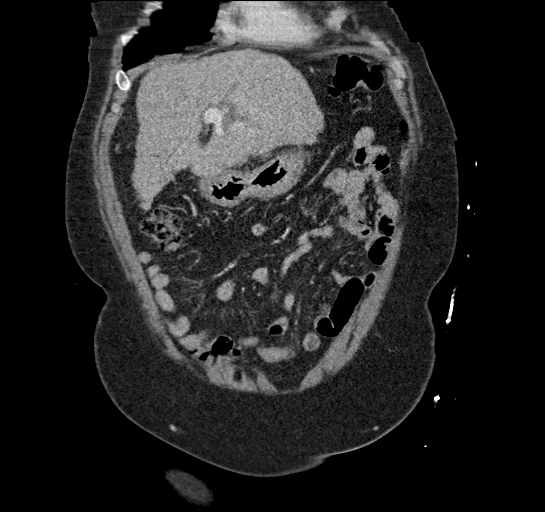
[im 50/113  soft-tissue]
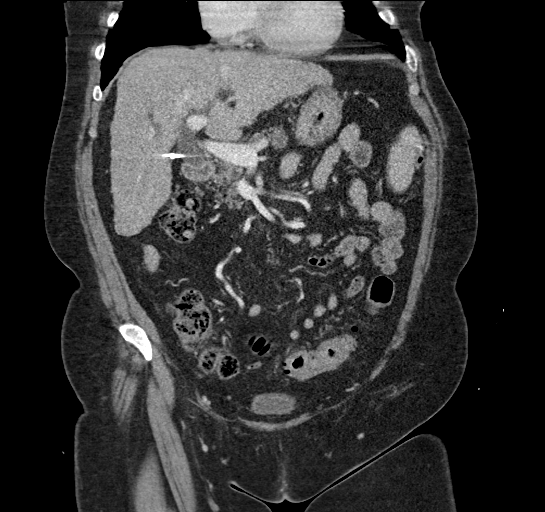
[im 63/113  soft-tissue]
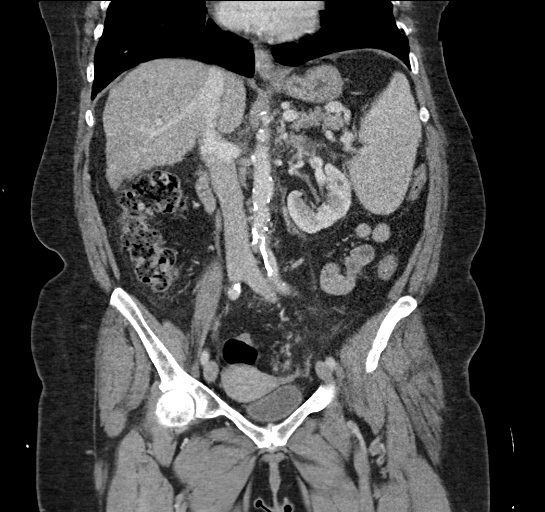

[16 of 46 positions shown; findings below may reference images not displayed]

RADIATION DOSE REDUCTION: This exam was performed according to the
departmental dose-optimization program which includes automated
exposure control, adjustment of the mA and/or kV according to
patient size and/or use of iterative reconstruction technique.

CONTRAST:  100mL OMNIPAQUE IOHEXOL 350 MG/ML SOLN
FINDINGS: Lower chest: Lung bases are clear.

Hepatobiliary: Liver has a fine nodular contour. Liver is shrunken
within enlarged caudate lobe. There is mild intrahepatic duct
dilatation and moderate extrahepatic biliary duct dilatation.
Postcholecystectomy. No obstructing lesion identified. The common
hepatic duct measures 15 mm. The common bile duct measures 10 mm
(image 50 and 58 of series 6). Findings similar to most recent
comparison MRI.

Pancreas: Unilocular cyst in the head of the pancreas measures 12 mm
unchanged from comparison MRI. No pancreatic duct dilatation. Mild
pancreatic atrophy similar prior.

Spleen: Spleen is prominent measuring 14 cm in craniocaudad
dimension

Adrenals/urinary tract: Adrenal glands and kidneys are normal. The
ureters and bladder normal.

Stomach/Bowel: Stomach duodenum small normal. No evidence small
bowel obstruction. A short segment small bowel enters a ventral
abdominal hernia (image 51/3). Approximately 2-3 cm of small bowel
occupy the hernia sac which measures 3 cm in diameter. Findings are
similar to comparison MRI. The small bowel entering the sac is new
from comparison CT [DATE]. Distal small bowel normal. Multiple
diverticula of the descending colon and sigmoid colon without acute
inflammation.

Vascular/Lymphatic: Abdominal aorta is normal caliber with
atherosclerotic calcification. There is no retroperitoneal or
periportal lymphadenopathy. No pelvic lymphadenopathy.

Reproductive: Uterus and adnexa unremarkable.

Other: No free fluid.

Musculoskeletal: No aggressive osseous lesion.
IMPRESSION: 1. Short segment of small bowel enters a ventral hernia. No evidence
of bowel obstruction. Patient may be at risk for incarcerated
hernia.
2. Morphologic changes in liver consistent cirrhosis. Stable biliary
duct dilatation unchanged from comparison MRI.

## 2022-03-13 MED ORDER — ONDANSETRON HCL 4 MG/2ML IJ SOLN
4.0000 mg | Freq: Once | INTRAMUSCULAR | Status: AC
  Administered 2022-03-13: 4 mg via INTRAVENOUS
  Filled 2022-03-13: qty 2

## 2022-03-13 MED ORDER — IOHEXOL 350 MG/ML SOLN
100.0000 mL | Freq: Once | INTRAVENOUS | Status: AC | PRN
  Administered 2022-03-13: 100 mL via INTRAVENOUS

## 2022-03-13 MED ORDER — MORPHINE SULFATE (PF) 4 MG/ML IV SOLN
4.0000 mg | Freq: Once | INTRAVENOUS | Status: AC
  Administered 2022-03-13: 4 mg via INTRAVENOUS
  Filled 2022-03-13: qty 1

## 2022-03-13 MED ORDER — LACTATED RINGERS IV SOLN: INTRAVENOUS | Status: DC

## 2022-03-13 NOTE — Discharge Instructions (Addendum)
You have an appointment with general surgery next month and they can discuss fixing this hernia.  If it starts hurting again do what you normally do by laying down relaxing and trying to push it back in.  However if the pain gets to where it was like today return to the emergency room.  Avoid heavy lifting or straining which can make hernias worse. ?

## 2022-03-13 NOTE — ED Triage Notes (Signed)
Pt arrived POV from home c/o abdominal pain. Pt states she had an umbilical hernia operated on a while back and it is hurting in that same area. Pt states the pain started this morning. Pt endorses nausea.  ?

## 2022-03-13 NOTE — ED Provider Notes (Signed)
?[place] ?Provider Note ? ? ?CSN: [id number] ?Arrival date & time: 03/13/22  0730 ? ?  ? ?History ? ?Chief Complaint  ?Patient presents with  ? Abdominal Pain  ? ? ?Samantha Evans is a 61 y.o. female. ? ?Patient is a 61 year old female with a history of hypertension, tobacco use, cirrhosis who is presenting today with abdominal pain.  Patient had umbilical hernia repair approximately 1 year ago and reported that she had another hernia that she was told of but it had not been giving her any problems.  Occasionally in the mornings when she wakes up it will hurt but she can apply pressure to her abdomen and it will go away.  However 2 days ago she was lifting some heavy things helping her son and noticed it had been more uncomfortable in the morning.  However this morning she woke up and it was uncomfortable she tried to apply pressure but the pain did not improve.  It has only become more severe since she woke up this morning.  It is a stabbing burning uncomfortable pain in the center of her abdomen above her umbilicus.  It makes her nauseated but she has not vomited.  She reports yesterday was a normal day there is nothing out of the ordinary.  Her bowel movements have been normal.  She has not had any recent illnesses and denies any fever or urinary symptoms. ? ?The history is provided by the patient and medical records.  ?Abdominal Pain ? ?  ? ?Home Medications ?Prior to Admission medications   ?Medication Sig Start Date End Date Taking? Authorizing Provider  ?CALCIUM CITRATE PO Take 1 tablet by mouth See admin instructions. Take 1 tablet by mouth every 3-4 days.   Yes [provider]  ?ibuprofen (ADVIL) 200 MG tablet Take 400 mg by mouth daily as needed for headache.   Yes [provider]  ?Psyllium (METAMUCIL PO) Take 16 oz by mouth See admin instructions. Take 16 oz by mouth every 2 days.   Yes [provider]  ?spironolactone (ALDACTONE) 50 MG  tablet Take 1 tablet (50 mg total) by mouth daily. ?Patient taking differently: Take 50 mg by mouth See admin instructions. Take 50 mg by mouth 4-5 times a week. 09/08/21  Yes [name], MD  ?Sofosbuvir-Velpatasvir (EPCLUSA) 400-100 MG TABS Take 1 tablet by mouth daily. ?Patient not taking: Reported on 03/13/2022 04/08/21   Kuppelweiser, Rubin Payor L, RPH-CPP  ?   ? ?Allergies    ?Patient has no known allergies.   ? ?Review of Systems   ?Review of Systems  ?Gastrointestinal:  Positive for abdominal pain.  ? ?Physical Exam ?Updated Vital Signs ?BP 139/66 (BP Location: Left Arm)   Pulse 69   Temp 98 ?F (36.7 ?C) (Oral)   Resp 17   Ht '5\' 1"'$  (1.549 m)   Wt 90.7 kg   SpO2 96%   BMI 37.79 kg/m?  ?Physical Exam ?Vitals and nursing note reviewed.  ?Constitutional:   ?   General: She is not in acute distress. ?   Appearance: She is well-developed.  ?   Comments: Appears uncomfortable  ?HENT:  ?   Head: Normocephalic and atraumatic.  ?Eyes:  ?   Pupils: Pupils are equal, round, and reactive to light.  ?Cardiovascular:  ?   Rate and Rhythm: Normal rate and regular rhythm.  ?   Heart sounds: Normal heart sounds. No murmur heard. ?  No friction rub.  ?Pulmonary:  ?  Effort: Pulmonary effort is normal.  ?   Breath sounds: Normal breath sounds. No wheezing or rales.  ?Abdominal:  ?   General: Abdomen is protuberant. Bowel sounds are normal. There is distension.  ?   Palpations: Abdomen is soft.  ?   Tenderness: There is abdominal tenderness in the periumbilical area. There is no guarding or rebound.  ?   Hernia: A hernia is present. Hernia is present in the ventral area.  ?   Comments: Due to patient's body habitus exam is somewhat limited but potential small mass noted superior to the umbilicus that is extremely tender on exam.  Cannot consistently palpated.  The rest of the abdomen is soft and no significant tenderness in other quadrants  ?Musculoskeletal:     ?   General: No tenderness. Normal range of motion.  ?    Comments: No edema  ?Skin: ?   General: Skin is warm and dry.  ?   Findings: No rash.  ?Neurological:  ?   Mental Status: She is alert and oriented to person, place, and time.  ?   Cranial Nerves: No cranial nerve deficit.  ?Psychiatric:     ?   Behavior: Behavior normal.  ? ? ?ED Results / Procedures / Treatments   ?Labs ?(all labs ordered are listed, but only abnormal results are displayed) ?Labs Reviewed  ?COMPREHENSIVE METABOLIC PANEL - Abnormal; Notable for the following components:  ?    Result Value  ? Glucose, Bld 107 (*)   ? Calcium 10.8 (*)   ? Albumin 3.4 (*)   ? All other components within normal limits  ?CBC - Abnormal; Notable for the following components:  ? Hemoglobin 11.8 (*)   ? Platelets 120 (*)   ? All other components within normal limits  ?URINALYSIS, ROUTINE W REFLEX MICROSCOPIC - Abnormal; Notable for the following components:  ? APPearance HAZY (*)   ? Hgb urine dipstick MODERATE (*)   ? Protein, ur 30 (*)   ? Leukocytes,Ua TRACE (*)   ? All other components within normal limits  ?LIPASE, BLOOD  ? ? ?EKG ?None ? ?Radiology ?CT ABDOMEN PELVIS W CONTRAST ? ?Result Date: 03/13/2022 ?CLINICAL DATA:  Abdominal pain. Concern for incarcerated ventral hernia EXAM: CT ABDOMEN AND PELVIS WITH CONTRAST TECHNIQUE: Multidetector CT imaging of the abdomen and pelvis was performed using the standard protocol following bolus administration of intravenous contrast. RADIATION DOSE REDUCTION: This exam was performed according to the departmental dose-optimization program which includes automated exposure control, adjustment of the mA and/or kV according to patient size and/or use of iterative reconstruction technique. CONTRAST:  159m OMNIPAQUE IOHEXOL 350 MG/ML SOLN COMPARISON:  MRI 11/12/2021, CT 08/05/2021 FINDINGS: Lower chest: Lung bases are clear. Hepatobiliary: Liver has a fine nodular contour. Liver is shrunken within enlarged caudate lobe. There is mild intrahepatic duct dilatation and moderate  extrahepatic biliary duct dilatation. Postcholecystectomy. No obstructing lesion identified. The common hepatic duct measures 15 mm. The common bile duct measures 10 mm (image 50 and 58 of series 6). Findings similar to most recent comparison MRI. Pancreas: Unilocular cyst in the head of the pancreas measures 12 mm unchanged from comparison MRI. No pancreatic duct dilatation. Mild pancreatic atrophy similar prior. Spleen: Spleen is prominent measuring 14 cm in craniocaudad dimension Adrenals/urinary tract: Adrenal glands and kidneys are normal. The ureters and bladder normal. Stomach/Bowel: Stomach duodenum small normal. No evidence small bowel obstruction. A short segment small bowel enters a ventral abdominal hernia (image 51/3). Approximately 2-3 cm  of small bowel occupy the hernia sac which measures 3 cm in diameter. Findings are similar to comparison MRI. The small bowel entering the sac is new from comparison CT 08/13/2021. Distal small bowel normal. Multiple diverticula of the descending colon and sigmoid colon without acute inflammation. Vascular/Lymphatic: Abdominal aorta is normal caliber with atherosclerotic calcification. There is no retroperitoneal or periportal lymphadenopathy. No pelvic lymphadenopathy. Reproductive: Uterus and adnexa unremarkable. Other: No free fluid. Musculoskeletal: No aggressive osseous lesion. IMPRESSION: 1. Short segment of small bowel enters a ventral hernia. No evidence of bowel obstruction. Patient may be at risk for incarcerated hernia. 2. Morphologic changes in liver consistent cirrhosis. Stable biliary duct dilatation unchanged from comparison MRI. Electronically Signed   By: Suzy Bouchard M.D.   On: 03/13/2022 10:00   ? ?Procedures ?Procedures  ? ? ?Medications Ordered in ED ?Medications  ?lactated ringers infusion ( Intravenous New Bag/Given 03/13/22 0821)  ?morphine (PF) 4 MG/ML injection 4 mg (4 mg Intravenous Given 03/13/22 0823)  ?ondansetron [facility]) injection 4  mg (4 mg Intravenous Given 03/13/22 0823)  ?iohexol (OMNIPAQUE) 350 MG/ML injection 100 mL (100 mLs Intravenous Contrast Given 03/13/22 0950)  ? ? ?ED Course/ Medical Decision Making/ A&P ?  ?                        ?Me

## 2022-03-13 NOTE — Consult Note (Signed)
? ? ? ?Samantha Evans ?August 31, 1961  ?[id number].   ? ?Requesting MD: Maryan Rued, MD ?Chief Complaint/Reason for Consult: ventral hernia  ? 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  She has a known ventral hernia.  States every morning she reduces her hernia herself, usually it is nontender.  She reports increased abdominal pain over the last 1 to 2 days and is unsure if she has been able to reduce her hernia.  Does report lifting some heavy boxes 2 days ago.  Due to persistent abdominal pain she presented to the emergency department for evaluation.  She reports smoking about half pack per day of cigarettes.  Denies significant alcohol or other illicit drug use.  Denies the use of blood thinning medications.  Her cirrhosis is followed by gastroenterology and she states she is to be discharged from their care in May after completing treatment. ? ?Past abdominal surgeries include emergent umbilical hernia repair in April 2022 by Dr. Grandville Silos for incarcerated umbilical hernia. ? ? ? ?ROS: ?Review of Systems  ?Constitutional: Negative.   ?HENT: Negative.    ?Eyes: Negative.   ?Respiratory: Negative.    ?Cardiovascular: Negative.   ?Gastrointestinal:  Positive for abdominal pain. Negative for blood in stool, nausea and vomiting.  ?Genitourinary: Negative.   ?Skin: Negative.   ?Neurological: Negative.   ?Endo/Heme/Allergies: Negative.   ?Psychiatric/Behavioral: Negative.    ? ?Family History  ?Problem Relation Age of Onset  ? Other Mother   ?     Hit by a train while in her truck  ? Lung cancer Father   ? Liver disease Neg Hx   ? Colon cancer Neg Hx   ? Esophageal cancer Neg Hx   ? Pancreatic cancer Neg Hx   ? Stomach cancer Neg Hx   ? ? ?Past Medical History:  ?Diagnosis Date  ? Arthritis   ? Carpal tunnel syndrome   ?  Cataracts, bilateral   ? Cirrhosis ([place])   ? Hypertension   ? ? ?Past Surgical History:  ?Procedure Laterality Date  ? CATARACT EXTRACTION Bilateral   ? ERCP    ? UMBILICAL HERNIA REPAIR N/A 02/27/2021  ? Procedure: REPAIR INCARCERATED UMBILICAL HERNIA;  Surgeon: Georganna Skeans, MD;  Location: [facility];  Service: General;  Laterality: N/A;  ? ? ?Social History:  reports that she has been smoking cigarettes. She has a 20.00 pack-year smoking history. She has never used smokeless tobacco. She reports that she does not currently use alcohol. She reports that she does not currently use drugs. ? ?Allergies: No Known Allergies ? ?(Not in a hospital admission) ? ? ? ?Physical Exam: ?Blood pressure 137/66, pulse 94, temperature 98 ?F (36.7 ?C), temperature source Oral, resp. rate 15, height '5\' 1"'$  (1.549 m), weight 90.7 kg, SpO2 98 %. ?General: Pleasant white female laying on hospital bed, appears stated age, NAD. ?HEENT: head -normocephalic, atraumatic; anicteric sclerae ?Neck- Trachea is midline, no thyromegaly  ?CV- RRR, no peripheral edema ?Pulm- breathing is non-labored ORA.  ?Abd- soft, obese, non-tender, hernia currently reduced - when the patient coughs I can feel bowel enter her hernia sack and then easily reduce back into the abdomen. ?GU- deferred  ?MSK- UE/LE symmetrical, no cyanosis, clubbing, or edema. ?Neuro- CN II-XII grossly in tact, no paresthesias. ?Psych- Alert and Oriented x3 with appropriate affect ?Skin: warm  and dry, no rashes or lesions ? ? ?Results for orders placed or performed during the hospital encounter of 03/13/22 (from the past 48 hour(s))  ?Lipase, blood     Status: None  ? Collection Time: 03/13/22  7:50 AM  ?Result Value Ref Range  ? Lipase 48 11 - 51 U/L  ?  Comment: Performed at [facility], [place] [address], [place], [place] [address]  ?Comprehensive metabolic panel     Status: Abnormal  ? Collection Time: 03/13/22  7:50 AM  ?Result Value Ref Range  ? Sodium 135 135 - 145 mmol/L  ?  Potassium 4.0 3.5 - 5.1 mmol/L  ? Chloride 107 98 - 111 mmol/L  ? CO2 23 22 - 32 mmol/L  ? Glucose, Bld 107 (H) 70 - 99 mg/dL  ?  Comment: Glucose reference range applies only to samples taken after fasting for at least 8 hours.  ? BUN 19 8 - 23 mg/dL  ? Creatinine, Ser 0.87 0.44 - 1.00 mg/dL  ? Calcium 10.8 (H) 8.9 - 10.3 mg/dL  ? Total Protein 7.0 6.5 - 8.1 g/dL  ? Albumin 3.4 (L) 3.5 - 5.0 g/dL  ? AST 30 15 - 41 U/L  ? ALT 17 0 - 44 U/L  ? Alkaline Phosphatase 108 38 - 126 U/L  ? Total Bilirubin 0.7 0.3 - 1.2 mg/dL  ? GFR, Estimated >60 >60 mL/min  ?  Comment: (NOTE) ?Calculated using the CKD-EPI Creatinine Equation (2021) ?  ? Anion gap 5 5 - 15  ?  Comment: Performed at [facility], [place] [address], [place], [place] [address]  ?CBC     Status: Abnormal  ? Collection Time: 03/13/22  7:50 AM  ?Result Value Ref Range  ? WBC 5.0 4.0 - 10.5 K/uL  ? RBC 4.14 3.87 - 5.11 MIL/uL  ? Hemoglobin 11.8 (L) 12.0 - 15.0 g/dL  ? HCT 36.0 36.0 - 46.0 %  ? MCV 87.0 80.0 - 100.0 fL  ? MCH 28.5 26.0 - 34.0 pg  ? MCHC 32.8 30.0 - 36.0 g/dL  ? RDW 15.0 11.5 - 15.5 %  ? Platelets 120 (L) 150 - 400 K/uL  ?  Comment: Immature Platelet Fraction may be ?clinically indicated, consider ?ordering this additional test ?[id number] ?REPEATED TO VERIFY ?  ? nRBC 0.0 0.0 - 0.2 %  ?  Comment: Performed at [facility], [place] [address] [place],  [address]  ?Urinalysis, Routine w reflex microscopic Urine, Clean Catch     Status: Abnormal  ? Collection Time: 03/13/22  8:07 AM  ?Result Value Ref Range  ? Color, Urine YELLOW YELLOW  ? APPearance HAZY (A) CLEAR  ? Specific Gravity, Urine 1.018 1.005 - 1.030  ? pH 5.0 5.0 - 8.0  ? Glucose, UA NEGATIVE NEGATIVE mg/dL  ? Hgb urine dipstick MODERATE (A) NEGATIVE  ? Bilirubin Urine NEGATIVE NEGATIVE  ? Ketones, ur NEGATIVE NEGATIVE mg/dL  ? Protein, ur 30 (A) NEGATIVE mg/dL  ? Nitrite NEGATIVE NEGATIVE  ? Leukocytes,Ua TRACE (A) NEGATIVE  ? RBC / HPF 0-5 0 - 5 RBC/hpf  ? WBC, UA 6-10 0 - 5  WBC/hpf  ? Bacteria, UA NONE SEEN NONE SEEN  ? Squamous Epithelial / LPF 11-20 0 - 5  ? Mucus PRESENT   ? Hyaline Casts, UA PRESENT   ?  Comment: Performed at [facility], [place] [address] [place],  [address]  ? ?CT ABDOMEN PELVIS W CONTRAST ? ?Result  Date: 03/13/2022 ?CLINICAL DATA:  Abdominal pain. Concern for incarcerated ventral hernia EXAM: CT ABDOMEN AND PELVIS WITH CONTRAST TECHNIQUE: Multidetector CT imaging of the abdomen and pelvis was performed using the standard protocol following bolus administration of intravenous contrast. RADIATION DOSE REDUCTION: This exam was performed according to the departmental dose-optimization program which includes automated exposure control, adjustment of the mA and/or kV according to patient size and/or use of iterative reconstruction technique. CONTRAST:  118m OMNIPAQUE IOHEXOL 350 MG/ML SOLN COMPARISON:  MRI 11/12/2021, CT 08/05/2021 FINDINGS: Lower chest: Lung bases are clear. Hepatobiliary: Liver has a fine nodular contour. Liver is shrunken within enlarged caudate lobe. There is mild intrahepatic duct dilatation and moderate extrahepatic biliary duct dilatation. Postcholecystectomy. No obstructing lesion identified. The common hepatic duct measures 15 mm. The common bile duct measures 10 mm (image 50 and 58 of series 6). Findings similar to most recent comparison MRI. Pancreas: Unilocular cyst in the head of the pancreas measures 12 mm unchanged from comparison MRI. No pancreatic duct dilatation. Mild pancreatic atrophy similar prior. Spleen: Spleen is prominent measuring 14 cm in craniocaudad dimension Adrenals/urinary tract: Adrenal glands and kidneys are normal. The ureters and bladder normal. Stomach/Bowel: Stomach duodenum small normal. No evidence small bowel obstruction. A short segment small bowel enters a ventral abdominal hernia (image 51/3). Approximately 2-3 cm of small bowel occupy the hernia sac which measures 3 cm in diameter. Findings are  similar to comparison MRI. The small bowel entering the sac is new from comparison CT 08/13/2021. Distal small bowel normal. Multiple diverticula of the descending colon and sigmoid colon without acute inflam

## 2022-03-16 ENCOUNTER — Ambulatory Visit (HOSPITAL_COMMUNITY): Payer: Medicaid Other

## 2022-03-16 DIAGNOSIS — Z7689 Persons encountering health services in other specified circumstances: Secondary | ICD-10-CM | POA: Diagnosis not present

## 2022-03-16 DIAGNOSIS — Z419 Encounter for procedure for purposes other than remedying health state, unspecified: Secondary | ICD-10-CM | POA: Diagnosis not present

## 2022-03-19 ENCOUNTER — Other Ambulatory Visit: Payer: Self-pay

## 2022-03-19 NOTE — Patient Instructions (Signed)
[name] ,  ? ?The [facility] Managed Care Team is available to provide assistance to you with your healthcare needs at no cost and as a benefit of your [facility] Health plan.  ? ?I'm sorry I was unable to reach you today for our scheduled appointment. This was the second unsuccessful telephone outreach from your RN Care Freight forwarder.  Our care guide will call you to reschedule our telephone appointment. Please call me at the number below. I am available to be of assistance to you regarding your healthcare needs. .  ? ?Thank you,  ? ?Salvatore Marvel RN, BSN ?Community Care Coordinator ?[place] [facility] ?Mobile: [phone]   ?

## 2022-03-19 NOTE — Patient Outreach (Signed)
Care Coordination ? ?03/19/2022 ? ?[place] ?November 15, 1961 ?[id number] ? ?03/19/2022 ?Name: Samantha Evans MRN: [id number] DOB: 23-Sep-1961 ? ?Referred by: [name], MD ?Reason for referral : High Risk Managed Medicaid (Unsuccessful Telephone Outreach) ? ? ?A second unsuccessful telephone outreach was attempted today. The patient was referred to the case management team for assistance with care management and care coordination.   ? ?Follow Up Plan: A HIPAA compliant phone message was left for the patient providing contact information and requesting a return call.  The Managed Medicaid care management team will reach out to the patient again over the next 7 - 14 days.  ? ? ?Salvatore Marvel RN, BSN ?Community Care Coordinator ?[facility] [facility] ?Mobile: [phone]  ?

## 2022-03-20 ENCOUNTER — Telehealth: Payer: Self-pay | Admitting: Gastroenterology

## 2022-03-20 ENCOUNTER — Telehealth: Payer: Self-pay | Admitting: Family Medicine

## 2022-03-20 NOTE — Telephone Encounter (Signed)
Spoke with NIA nurse and was informed the pt's MRI would not be able to be clinically approved until the pt has an [place] of Abdomen indicating the reason why another MRI is needed since she just had one done in Dec 2022 ?

## 2022-03-20 NOTE — Telephone Encounter (Signed)
.. ?  Medicaid Managed Care  ? ?Unsuccessful Outreach Note ? ?03/20/2022 ?Name: Samantha Evans MRN: [id number] DOB: [birth year] ? ?Referred by: [name], MD ?Reason for referral : High Risk Managed Medicaid (I called the patient today to get her rescheduled with the MM Team. I left my name and number on her VM.) ? ? ?A second unsuccessful telephone outreach was attempted today. The patient was referred to the case management team for assistance with care management and care coordination.  ? ?Follow Up Plan: The care management team will reach out to the patient again over the next 14 days.  ? ? ?Reita Chard ?Care Guide, High Risk Medicaid Managed Care ?Embedded Care Coordination ?[place]  ? ? ?SIGNATURE  ?

## 2022-03-23 NOTE — Telephone Encounter (Signed)
They have received the clinical information. I spoke with Darcella Gasman C and was informed a peer to peer could be done, the provider can call and ask to speak with a provider for this case.  NIA peer to peer: [phone] Press 8 Enter Tracking#: [id number] Press 2 Press 3 Press 1

## 2022-03-24 ENCOUNTER — Ambulatory Visit (HOSPITAL_COMMUNITY): Payer: Medicaid Other

## 2022-03-26 ENCOUNTER — Encounter: Payer: Self-pay | Admitting: Infectious Diseases

## 2022-03-26 ENCOUNTER — Telehealth: Payer: Self-pay

## 2022-03-26 ENCOUNTER — Ambulatory Visit (INDEPENDENT_AMBULATORY_CARE_PROVIDER_SITE_OTHER): Payer: Medicaid Other | Admitting: Infectious Diseases

## 2022-03-26 ENCOUNTER — Other Ambulatory Visit: Payer: Self-pay

## 2022-03-26 VITALS — BP 150/81 | HR 68 | Temp 98.2°F | Ht 60.0 in | Wt 202.0 lb

## 2022-03-26 DIAGNOSIS — B182 Chronic viral hepatitis C: Secondary | ICD-10-CM | POA: Diagnosis not present

## 2022-03-26 DIAGNOSIS — Z7689 Persons encountering health services in other specified circumstances: Secondary | ICD-10-CM | POA: Diagnosis not present

## 2022-03-26 NOTE — Patient Instructions (Signed)
Please stop by the lab on your way out to make sure the hepatitis C is gone.  ? ?Going forward, you will need screening twice a year for liver cancer. This is done with an ultrasound and a blood test. [name] or your primary care provider would be able to help you with this.  ? ?As long as you have been cured, you do not need to come back to see [place] here.  ?

## 2022-03-26 NOTE — Progress Notes (Signed)
? ?Patient Name: Samantha Evans  ?Date of Birth: 08-21-1961  ?MRN: [id number]  ?PCP: [name], MD  ?Referring Provider: [name], MD, Ph#: [phone] ? ? ?CC:  ?FU hepatitis C infection, SVR visit.  ? ? ? ?Narrative:  ? ?Samantha Evans is a 61 y.o. female here for follow up.  ? ?Start Date: 04/11/21  ? ?Completion Date: 10/12/2021 (estimated)  ?  ?Hepatitis C Genotype: 1a ?  ?Fibrosis Score: F4, decompensatd ?  ?Hepatitis C RNA: 954,000 on 03/02/21 ?*no follow ups on file as she has had transportation problems since completing her medications.  ? ? ? ?HPI since Last OV in Dec 2022:  ?Has continued working with her GI team and PCP team.  Has had a recurrent ventral hernia and cysts on her pancreas that is in the process of getting worked up. Unable to afford MRI copay of $1,000.  ?She has AFP in march that has normalized < 10; previously elevated in December.  ? ?We spent time discussing other health concerns and things going on at home.  ? ?She assures me she took every pill without any delay for the full 6 months.  ? ? ? ?ROS:  ?Review of Systems  ?Constitutional:  Negative for appetite change, fatigue, fever and unexpected weight change.  ?Respiratory:  Negative for shortness of breath.   ?Cardiovascular:  Negative for chest pain and leg swelling.  ?Gastrointestinal:  Positive for abdominal pain (2/2 hernia). Negative for blood in stool, nausea and vomiting.  ?Genitourinary:  Negative for difficulty urinating and hematuria.  ?Musculoskeletal:  Positive for arthralgias.  ?Skin:  Negative for color change.  ?Neurological:  Negative for dizziness, tremors and headaches.  ?Hematological:  Negative for adenopathy.  ?Psychiatric/Behavioral:  Negative for confusion.   ? ? ? ?Past Medical History:  ?Diagnosis Date  ? Arthritis   ? Carpal tunnel syndrome   ? Cataracts, bilateral   ? Cirrhosis ([employer])   ? Hypertension   ? ? ?Prior to Admission medications   ?Medication Sig Start Date End Date Taking? Authorizing  Provider  ?docusate sodium (COLACE) 100 MG capsule Take 1 capsule (100 mg total) by mouth 2 (two) times daily. 03/03/21   Kathie Dike, MD  ?pantoprazole (PROTONIX) 40 MG tablet Take 1 tablet (40 mg total) by mouth 2 (two) times daily. 03/03/21   Kathie Dike, MD  ?polyethylene glycol (MIRALAX / GLYCOLAX) 17 g packet Take 17 g by mouth daily as needed for mild constipation. 03/03/21   Kathie Dike, MD  ?traMADol (ULTRAM) 50 MG tablet Take 1 tablet (50 mg total) by mouth every 6 (six) hours as needed for severe pain. 03/03/21   Meuth, Blaine Hamper, PA-C  ? ? ?No Known Allergies ? ?Social History  ? ?Tobacco Use  ? Smoking status: Every Day  ?  Packs/day: 0.50  ?  Years: 40.00  ?  Pack years: 20.00  ?  Types: Cigarettes  ? Smokeless tobacco: Never  ? Tobacco comments:  ?  Quit smoking on 02/27/2021 when hernia repair surgery occurred.  Back to smoking .5 pack of cigarettes daily  ?Vaping Use  ? Vaping Use: Never used  ?Substance Use Topics  ? Alcohol use: Not Currently  ? Drug use: Not Currently  ? ? ?Family History  ?Problem Relation Age of Onset  ? Other Mother   ?     Hit by a train while in her truck  ? Lung cancer Father   ? Liver disease Neg Hx   ?  Colon cancer Neg Hx   ? Esophageal cancer Neg Hx   ? Pancreatic cancer Neg Hx   ? Stomach cancer Neg Hx   ? ? ?Objective:  ? ?Vitals:  ? 03/26/22 1432  ?BP: (!) 150/81  ?Pulse: 68  ?Temp: 98.2 ?F (36.8 ?C)  ?SpO2: 95%  ? ? ? ? ?Constitutional: in no apparent distress, well developed and well nourished and oriented times 3 ?Eyes: anicteric ?Cardiovascular: Cor RRR and No murmurs ?Respiratory: clear ?Gastrointestinal: Bowel sounds are normal, liver is not enlarged, spleen is not enlarged but body habitus limiting exam. Well healed surgical scar over umbilicus. Seems more like diastasis recti.  ?Musculoskeletal: pedal edema trace bilaterally, intact peripheral pulses ?Skin: negative for - jaundice, spider hemangioma, telangiectasia, palmar erythema, ecchymosis and  atrophy; no porphyria cutanea tarda ?Lymphatic: no cervical lymphadenopathy ? ? ?Laboratory: ?Genotype:  ?Lab Results  ?Component Value Date  ? HCVGENOTYPE 1a 03/25/2021  ? ?HCV viral load:  ?Lab Results  ?Component Value Date  ? HCVQUANT 954,000 03/02/2021  ? ?Lab Results  ?Component Value Date  ? WBC 5.0 03/13/2022  ? HGB 11.8 (L) 03/13/2022  ? HCT 36.0 03/13/2022  ? MCV 87.0 03/13/2022  ? PLT 120 (L) 03/13/2022  ?  ?Lab Results  ?Component Value Date  ? CREATININE 0.87 03/13/2022  ? BUN 19 03/13/2022  ? NA 135 03/13/2022  ? K 4.0 03/13/2022  ? CL 107 03/13/2022  ? CO2 23 03/13/2022  ?  ?Lab Results  ?Component Value Date  ? ALT 17 03/13/2022  ? AST 30 03/13/2022  ? ALKPHOS 108 03/13/2022  ?  ?Lab Results  ?Component Value Date  ? INR 1.2 (H) 01/16/2022  ? BILITOT 0.7 03/13/2022  ? ALBUMIN 3.4 (L) 03/13/2022  ? ? ? ?Imaging:  ?MRI of liver 03/01/2021 - cirrhosis+ with portal hypertension, enlarged spleen (14 cm) with trace ascites. Recommended FU in 24m  ? ? ?Assessment & Plan:  ? ? ?Problem List Items Addressed This Visit   ? ?  ? Unprioritized  ? Chronic hepatitis C without hepatic coma (HCC) - Primary  ?  Samantha Evans completed 6 months of Epclusa for chronic hepatitis C infection in decompensated (CP B) cirrhotic patient. She had no delays in doses, no trouble taking her medication during that time with completion > 3 months ago. Will check SVR today with blood work. She had AFP 29mgo that has normalized and awaiting repeat MRI for ventral hernia, pancreatic cysts; this will be a good surrogate for [place]creening as well.  ?Explained she will need to continue working with GI specialist and primary care [facility].  ? ?We will call her at number provided [phone] results and further recommendations.  ? ?  ?  ? Relevant Orders  ? Hepatitis C RNA quantitative (QUEST)  ? ? ? ?StJanene MadeiraMSN, NP-C ?ReDavieor Infectious Disease ?[facility]   ?[email or web address] ?Pager: [phone]: [phone] Main Line: [phone] ?

## 2022-03-26 NOTE — Assessment & Plan Note (Signed)
Samantha Evans completed 6 months of Epclusa for chronic hepatitis C infection in decompensated (CP B) cirrhotic patient. She had no delays in doses, no trouble taking her medication during that time with completion > 3 months ago. Will check SVR today with blood work. She had AFP 13mago that has normalized and awaiting repeat MRI for ventral hernia, pancreatic cysts; this will be a good surrogate for HChurch Hillscreening as well.  ?Explained she will need to continue working with GI specialist and primary care for [facility].  ? ?We will call her at number provided [phone] results and further recommendations.  ?

## 2022-03-26 NOTE — Telephone Encounter (Signed)
Called patient to see if she would be able to make it to today's appointment, no answer. Unable to leave voicemail as greeting did not match patient's name.  ? ?Beryle Flock, RN ? ?

## 2022-03-30 LAB — HEPATITIS C RNA QUANTITATIVE
HCV Quantitative Log: <1.18 log IU/mL
HCV RNA, PCR, QN: <15 IU/mL

## 2022-04-01 ENCOUNTER — Telehealth: Payer: Self-pay

## 2022-04-01 NOTE — Telephone Encounter (Signed)
Attempted to reach patient at preferred number [phone]. No answer and no voicemail set up. Will make second attempt. ?

## 2022-04-01 NOTE — Progress Notes (Signed)
Please give Samantha Evans a call to let her know her hepatitis C has indeed been cured.  ?She should continue following with her GI team as we discussed for ongoing liver care and cancer screenings with ultrasound/blood work every 6 months.  ? ?No further follow up with ID needed. Congratulations! ? ?She will need to be reached on her friend's phone as hers is off currently [employer] [phone]

## 2022-04-01 NOTE — Telephone Encounter (Signed)
-----   Message from Enterprise Callas, NP sent at 04/01/2022 11:10 AM EDT ----- ?Please give Samantha Evans a call to let her know her hepatitis C has indeed been cured.  ?She should continue following with her GI team as we discussed for ongoing liver care and cancer screenings with ultrasound/blood work every 6 months.  ? ?No further follow up with ID needed. Congratulations! ? ?She will need to be reached on her friend's phone as hers is off currently [place] [phone] ?

## 2022-04-01 NOTE — Telephone Encounter (Signed)
Following up to ensure MRI has been scheduled and did not see any current responses re: denial. Routing this message to provider and pre-cert team for update. ?

## 2022-04-02 ENCOUNTER — Telehealth: Payer: Self-pay | Admitting: Family Medicine

## 2022-04-02 NOTE — Telephone Encounter (Signed)
..   Medicaid Managed Care   Unsuccessful Outreach Note  04/02/2022 Name: Samantha Evans MRN: [id number] DOB: 01-04-1961  Referred by: [name], MD Reason for referral : High Risk Managed Medicaid (I called the patient today to get her rescheduled with the MM Team. I left my name and number on her VM.)   Third unsuccessful telephone outreach was attempted today. The patient was referred to the case management team for assistance with care management and care coordination. The patient's primary care provider has been notified of our unsuccessful attempts to make or maintain contact with the patient. The care management team is pleased to engage with this patient at any time in the future should he/she be interested in assistance from the care management team.   Follow Up Plan: We have been unable to make contact with the patient for follow up. The care management team is available to follow up with the patient after provider conversation with the patient regarding recommendation for care management engagement and subsequent re-referral to the care management team.   [facility]

## 2022-04-02 NOTE — Telephone Encounter (Signed)
Left HIPAA compliant voicemail requesting callback on friend's phone number.   Beryle Flock, RN

## 2022-04-07 ENCOUNTER — Other Ambulatory Visit: Payer: Self-pay

## 2022-04-07 NOTE — Patient Outreach (Signed)
Care Coordination  04/07/2022  Samantha Evans 12/03/1960 [id number]  04/07/2022 Name: Samantha Evans MRN: [id number] DOB: [birth year]  Referred by: [name], MD Reason for referral : High Risk Managed Medicaid (RNCM Notification of Case Closure)   Third unsuccessful telephone outreach was attempted today. The patient was referred to the case management team for assistance with care management and care coordination. The patient's primary care provider has been notified of our unsuccessful attempts to make or maintain contact with the patient. The care management team is pleased to engage with this patient at any time in the future should he/she be interested in assistance from the care management team.    The third unsuccessful telephone outreach occurred on May 18.2023 and this RN Case Manager was made aware of this.  Follow Up Plan: The Managed Medicaid care management team is available to follow up with the patient after provider conversation with the patient regarding recommendation for care management engagement and subsequent re-referral to the care management team.     Salvatore Marvel RN, BSN [facility] Coordinator [place] [facility] Mobile: [phone]

## 2022-04-07 NOTE — Patient Instructions (Signed)
[place] Better ,   The [facility] Managed Care Team is available to provide assistance to you with your healthcare needs at no cost and as a benefit of your [facility] Health plan.   We have been unable to reach you on 3 separate attempts. The care management team is available to assist with your healthcare needs at any time. Please do not hesitate to contact me at the number below. .   Thank you,  Salvatore Marvel RN, BSN [facility] Coordinator [place] [facility] Mobile: [phone]

## 2022-04-08 ENCOUNTER — Other Ambulatory Visit: Payer: Self-pay | Admitting: Gastroenterology

## 2022-04-08 ENCOUNTER — Ambulatory Visit (HOSPITAL_COMMUNITY)
Admission: RE | Admit: 2022-04-08 | Discharge: 2022-04-08 | Disposition: A | Discharge status: Home / Self Care | Payer: Medicaid Other | Source: Ambulatory Visit | Attending: Gastroenterology | Admitting: Gastroenterology

## 2022-04-08 DIAGNOSIS — K862 Cyst of pancreas: Secondary | ICD-10-CM

## 2022-04-08 DIAGNOSIS — K835 Biliary cyst: Secondary | ICD-10-CM | POA: Insufficient documentation

## 2022-04-08 DIAGNOSIS — K7689 Other specified diseases of liver: Secondary | ICD-10-CM

## 2022-04-08 DIAGNOSIS — K7460 Unspecified cirrhosis of liver: Secondary | ICD-10-CM | POA: Diagnosis not present

## 2022-04-08 DIAGNOSIS — K766 Portal hypertension: Secondary | ICD-10-CM | POA: Diagnosis not present

## 2022-04-08 DIAGNOSIS — K7469 Other cirrhosis of liver: Secondary | ICD-10-CM | POA: Insufficient documentation

## 2022-04-08 DIAGNOSIS — Z7689 Persons encountering health services in other specified circumstances: Secondary | ICD-10-CM | POA: Diagnosis not present

## 2022-04-08 DIAGNOSIS — N281 Cyst of kidney, acquired: Secondary | ICD-10-CM | POA: Diagnosis not present

## 2022-04-08 IMAGING — MR MR ABDOMEN WO/W CM MRCP
16 of 22 series · 36 of 48 positions shown · IV contrast (gadavist)
Comparison: [DATE]

CLINICAL DATA: Cirrhosis, liver cysts, pancreatic cystic lesions

EXAM:
MRI ABDOMEN WITHOUT AND WITH CONTRAST (INCLUDING MRCP)
TECHNIQUE: Multiplanar multisequence MR imaging of the abdomen was performed
both before and after the administration of intravenous contrast.
Heavily T2-weighted images of the biliary and pancreatic ducts were
obtained, and three-dimensional MRCP images were rendered by post
processing.
CONTRAST:  9mL GADAVIST GADOBUTROL 1 MMOL/ML IV SOLN

[Series 2: DWI · axial · 6.0mm · 1.68mm/px · 1 of 72 slices shown (1 of 2)]
[im 1/72]
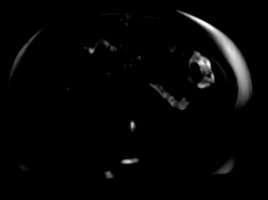

[Series 3: DWI · axial · 6.0mm · 1.68mm/px · 1 of 36 slices shown (2 of 2)]
[im 1/36]
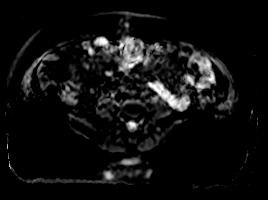

[Series 4: T2 fat-sat · axial · 6.0mm · 1.25mm/px · 1 of 36 slices shown]
[im 1/36]
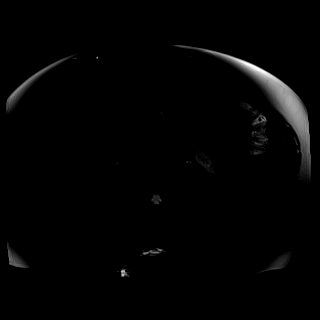

[Series 7: cor_3d_spc_trig · coronal · 1.0mm · 0.49mm/px · 3 of 88 slices shown]
[im 1/88]
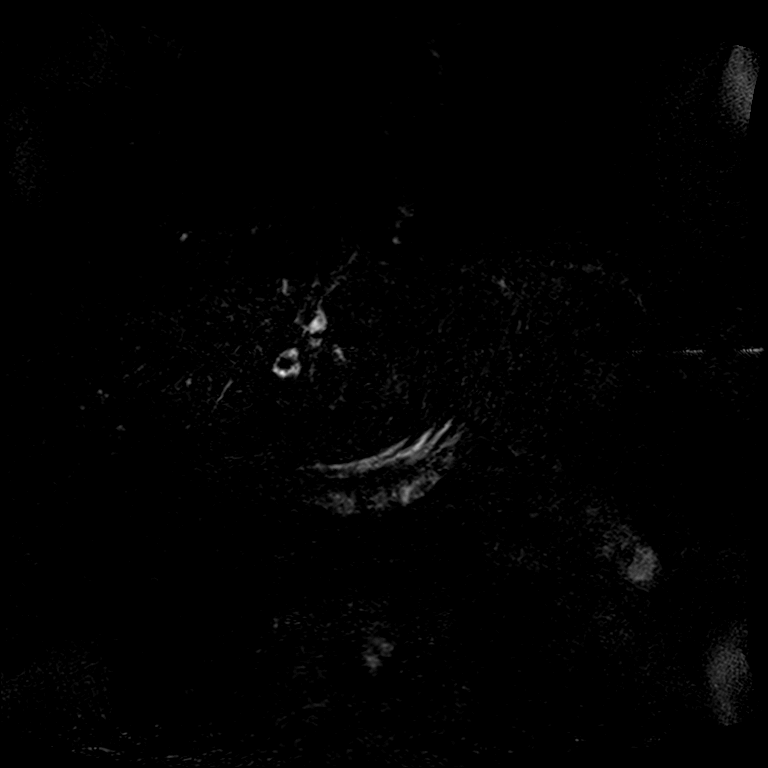
[im 44/88]
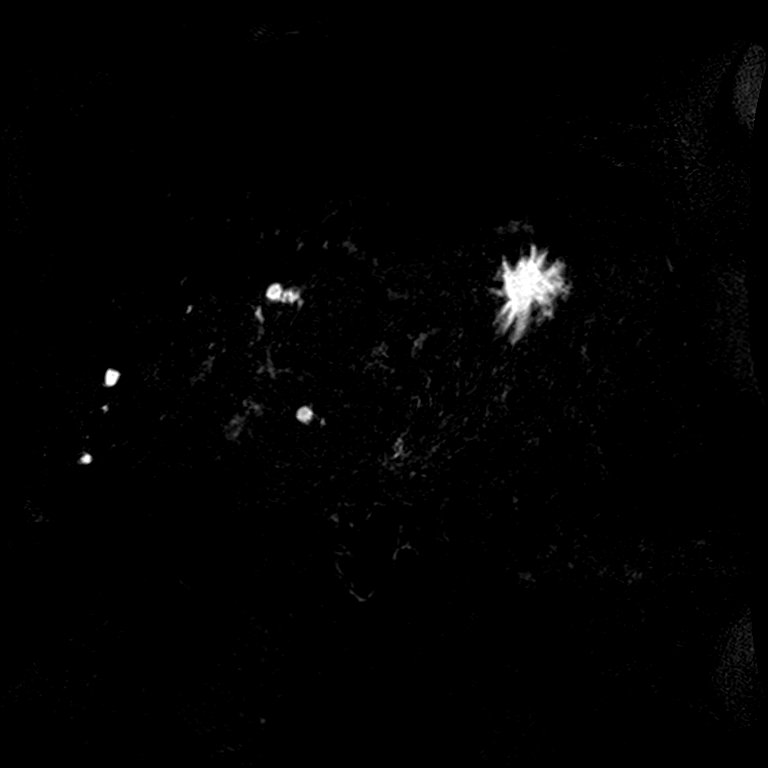
[im 88/88]
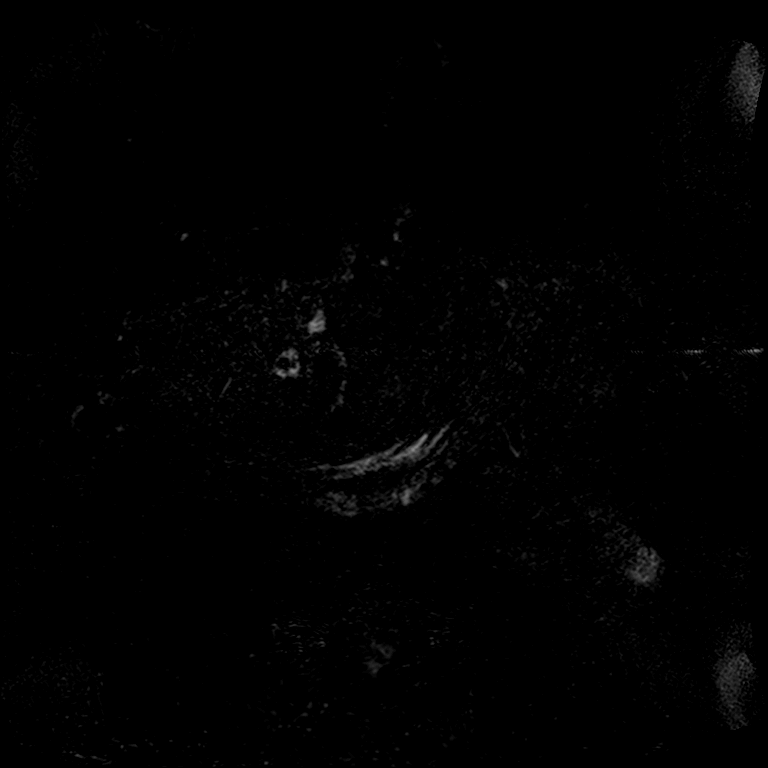

[Series 10: T2 · coronal · 6.0mm · 0.83mm/px · 1 of 36 slices shown (1 of 2)]
[im 1/36]
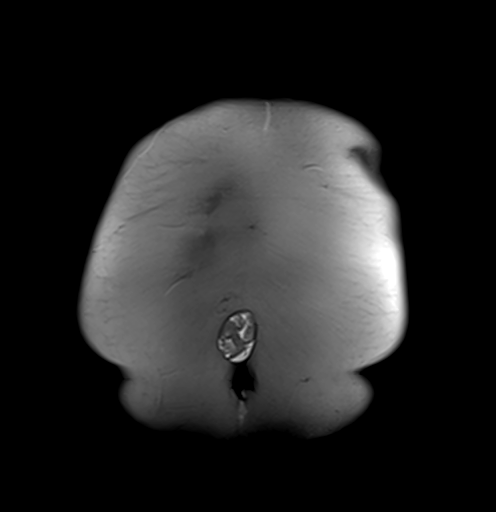

[Series 11: T1 · axial · 3.0mm · 1.48mm/px · z∈[-118,+118]mm · 3 of 80 slices shown (1 of 2)]
[im 1/80]
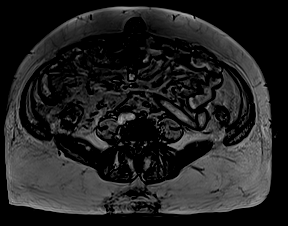
[im 40/80]
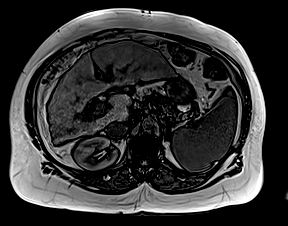
[im 80/80]
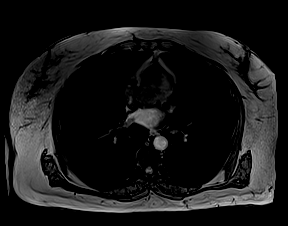

[Series 12: T1 · axial · 3.0mm · 1.48mm/px · z∈[-118,+118]mm · 3 of 80 slices shown (2 of 2)]
[im 1/80]
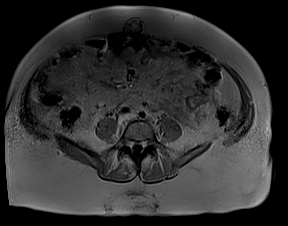
[im 40/80]
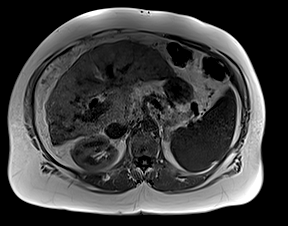
[im 80/80]
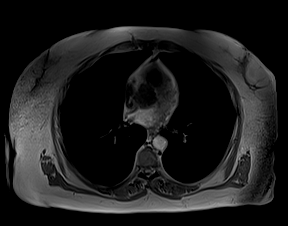

[Series 14: cor obl thk · sagittal · 50.0mm · 0.78mm/px · 1 of 9 slices shown]
[im 1/9]
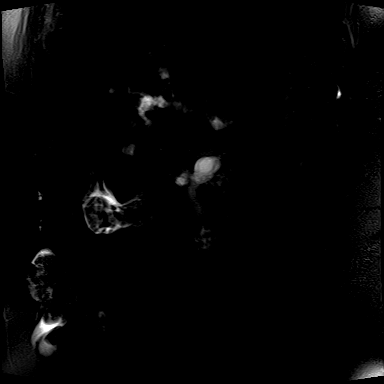

[Series 15: T2 · axial · 6.0mm · 1.72mm/px · 1 of 34 slices shown (2 of 2)]
[im 1/34]
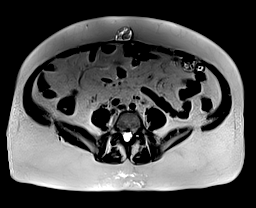

[Series 18: T1 dynamic · axial · 3.0mm · 1.38mm/px · z∈[-138,+123]mm · 3 of 88 slices shown (1 of 7)]
[im 1/88]
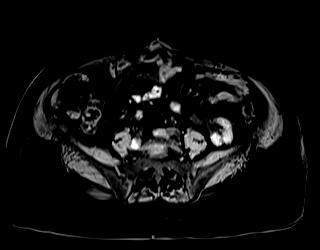
[im 44/88]
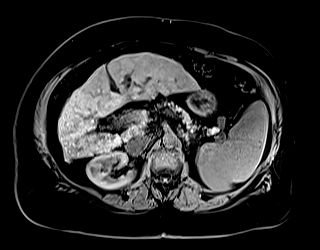
[im 88/88]
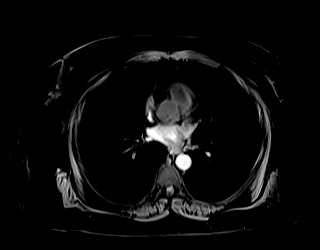

[Series 22: T1 dynamic · axial · 3.0mm · 1.38mm/px · z∈[-138,+123]mm · 3 of 88 slices shown (2 of 7)]
[im 1/88]
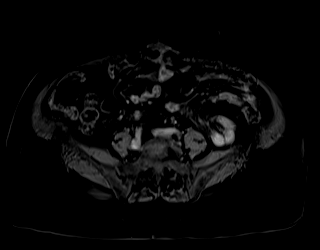
[im 44/88]
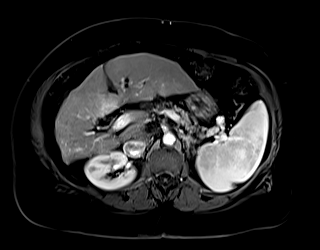
[im 88/88]
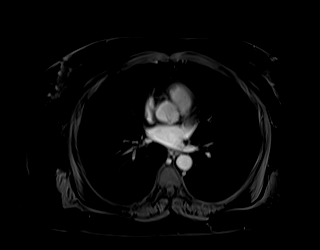

[Series 23: T1 dynamic · axial · 3.0mm · 1.38mm/px · z∈[-138,+123]mm · 3 of 88 slices shown (3 of 7)]
[im 1/88]
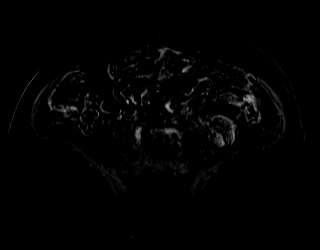
[im 44/88]
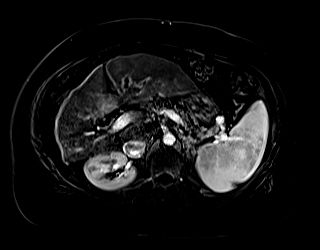
[im 88/88]
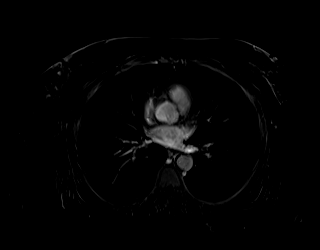

[Series 26: T1 dynamic · axial · 3.0mm · 1.38mm/px · z∈[-138,+123]mm · 3 of 88 slices shown (4 of 7)]
[im 1/88]
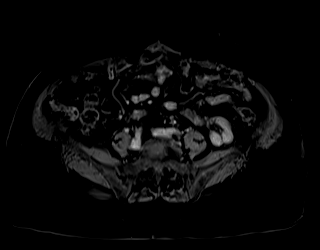
[im 44/88]
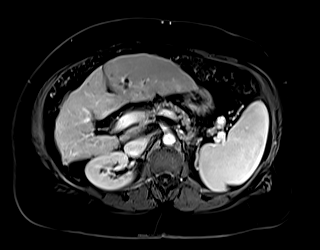
[im 88/88]
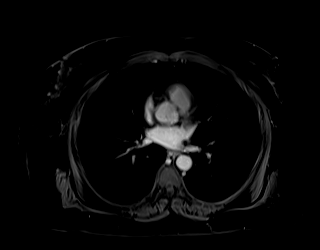

[Series 27: T1 dynamic · axial · 3.0mm · 1.38mm/px · z∈[-138,+123]mm · 3 of 88 slices shown (5 of 7)]
[im 1/88]
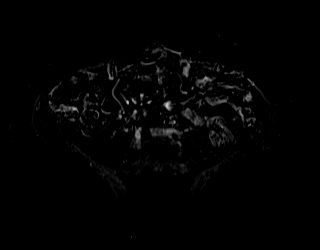
[im 44/88]
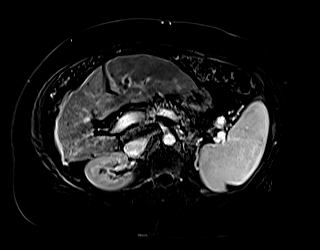
[im 88/88]
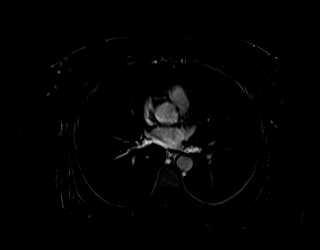

[Series 30: T1 dynamic · axial · 3.0mm · 1.38mm/px · z∈[-138,+123]mm · 3 of 88 slices shown (6 of 7)]
[im 1/88]
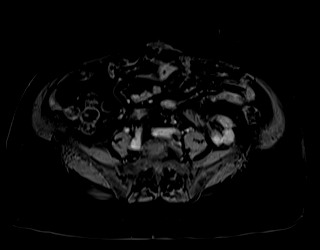
[im 44/88]
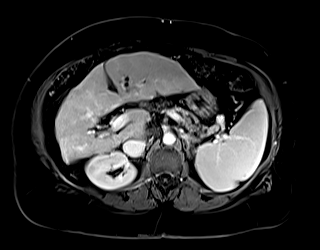
[im 88/88]
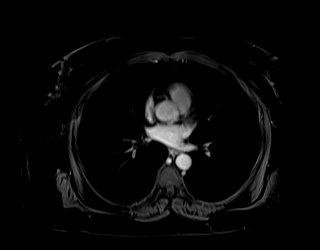

[Series 31: T1 dynamic · axial · 3.0mm · 1.38mm/px · z∈[-138,+123]mm · 3 of 88 slices shown (7 of 7)]
[im 1/88]
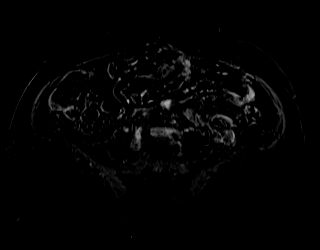
[im 44/88]
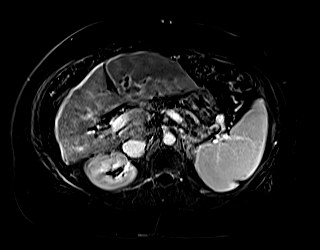
[im 88/88]
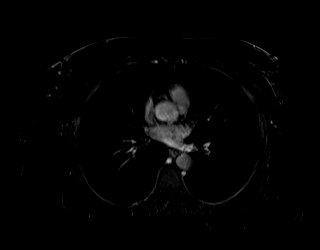

[36 of 48 positions shown; findings below may reference images not displayed]

FINDINGS: Lower chest: No acute findings.

Hepatobiliary: Coarse, nodular, cirrhotic morphology of the liver.
No new liver lesions or suspicious contrast enhancement. Majority of
previously described small foci of arterial hyperenhancement are not
clearly appreciated, and most likely exquisitely sensitive to exact
phase of arterial contrast administration, somewhat later on this
examination compared to prior examination. Occasional small foci,
however are visualized and not changed, for example in the anterior
left lobe of the liver, hepatic segment II, a 0.5 cm focus (series
22, image 25) and in the posterior right lobe of the liver, hepatic
segment VI, a 0.6 cm focus (series 22, image 34). Numerous simple,
benign hepatic parenchymal and peribiliary cysts (series 15, image
14). Status post cholecystectomy. Unchanged postoperative biliary
ductal dilatation.

Pancreas: Multiple cystic lesions of the pancreas, including of the
head and uncinate (series 15, image 21), the ventral neck (series
15, image 19), and the tail (series 15, image 20) are unchanged and
measure up to 2.1 x 1.5 cm in the tail (series 15, image 20). No
solid mass, inflammatory changes, or other parenchymal abnormality
identified.No pancreatic ductal dilatation.

Spleen:  Splenomegaly, maximum coronal span 16.0 cm.

Adrenals/Urinary Tract: Normal adrenal glands. Multiple simple
benign bilateral renal cortical cysts, for which no further
follow-up or characterization is required. No renal masses or
suspicious contrast enhancement identified. No evidence of
hydronephrosis.

Stomach/Bowel: Visualized portions within the abdomen are
unremarkable.

Vascular/Lymphatic: No pathologically enlarged lymph nodes
identified. No abdominal aortic aneurysm demonstrated. Aortic
atherosclerosis.

Other:  None.

Musculoskeletal: No suspicious osseous lesions identified.
IMPRESSION: 1. Cirrhosis. No new liver lesions or new suspicious contrast
enhancement. Majority of previously described small foci of arterial
hyperenhancement are not clearly appreciated today's examination,
and most likely exquisitely sensitive to exact phase of arterial
contrast administration. Occasional subcentimeter foci which are
seen are unchanged compared to prior examination and remain
consistent with NOMASIBULELE category 3, intermediate suspicion for
hepatocellular carcinoma. Recommend follow-up MR in 6 months.
2. Multiple cystic lesions of the pancreas, including of the head
and uncinate are unchanged and measure up to 2.1 x 1.5 cm in the
tail. These likely represent side branch intraductal papillary
mucinous neoplasms. Given size, recommend follow-up in 6 months to
ensure continued stability, which can be performed in conjunction
with above recommended follow-up.
3. Splenomegaly.

Aortic Atherosclerosis ([P8]-[P8]).

## 2022-04-08 MED ORDER — GADOBUTROL 1 MMOL/ML IV SOLN
9.0000 mL | Freq: Once | INTRAVENOUS | Status: AC | PRN
  Administered 2022-04-08: 9 mL via INTRAVENOUS

## 2022-04-16 DIAGNOSIS — Z419 Encounter for procedure for purposes other than remedying health state, unspecified: Secondary | ICD-10-CM | POA: Diagnosis not present

## 2022-04-28 ENCOUNTER — Telehealth: Payer: Self-pay | Admitting: *Deleted

## 2022-04-28 NOTE — Telephone Encounter (Signed)
-----   Message from Horris Latino, [place] sent at 01/16/2022  4:08 PM EST ----- Regarding: MRI Patient needs MRI w/MRCP in June 2023

## 2022-04-28 NOTE — Telephone Encounter (Signed)
Patient had MRI Abd/MRCP completed on 04/08/22

## 2022-04-30 ENCOUNTER — Ambulatory Visit: Payer: Self-pay | Admitting: *Deleted

## 2022-04-30 NOTE — Telephone Encounter (Signed)
  Chief Complaint: Knee Pain Symptoms: Pain both knees, ankles, wrists. Carpal tunnel right wrist, "Can't sit longer than 10 minutes, apin makes me lose balance." Frequency: 2 years ago, gradual worsening Pertinent Negatives: Patient denies  Disposition: '[]'$ ED /'[]'$ Urgent Care (no appt availability in office) / '[]'$ Appointment(In office/virtual)/ '[]'$  [facility]/ '[]'$ Home Care/ '[]'$ Refused Recommended Disposition /'[]'$ [facility]/ '[x]'$  Follow-up with PCP Additional Notes: Pt requesting disability forms initiated. Pt has appt 05/26/22, wishes to discuss at appt. Advised to [facility] for worsening symptoms.   Reason for Disposition  Knee giving way (or buckling) when walking is a chronic symptom (recurrent or ongoing AND present > 4 weeks)  Answer Assessment - Initial Assessment Questions 1. LOCATION and RADIATION: "Where is the pain located?"      Both knees, both ankles,wrists  2. QUALITY: "What does the pain feel like?"  (e.g., sharp, dull, aching, burning)     Varies 3. SEVERITY: "How bad is the pain?" "What does it keep you from doing?"   (Scale 1-10; or mild, moderate, severe)   -  MILD (1-3): doesn't interfere with normal activities    -  MODERATE (4-7): interferes with normal activities (e.g., work or school) or awakens from sleep, limping    -  SEVERE (8-10): excruciating pain, unable to do any normal activities, unable to walk     7/10 4. ONSET: "When did the pain start?" "Does it come and go, or is it there all the time?"     "Over 2 years now" 5. RECURRENT: "Have you had this pain before?" If Yes, ask: "When, and what happened then?"     Yes "For years" 6. SETTING: "Has there been any recent work, exercise or other activity that involved that part of the body?"      no 7. AGGRAVATING FACTORS: "What makes the knee pain worse?" (e.g., walking, climbing stairs, running)     Walking 8. ASSOCIATED SYMPTOMS: "Is there any swelling or redness of the knee?"     No 9. OTHER  SYMPTOMS: "Do you have any other symptoms?" (e.g., chest pain, difficulty breathing, fever, calf pain)     No  Protocols used: Knee Pain-A-AH

## 2022-05-16 DIAGNOSIS — Z419 Encounter for procedure for purposes other than remedying health state, unspecified: Secondary | ICD-10-CM | POA: Diagnosis not present

## 2022-05-26 ENCOUNTER — Other Ambulatory Visit (HOSPITAL_COMMUNITY)
Admission: RE | Admit: 2022-05-26 | Discharge: 2022-05-26 | Disposition: A | Discharge status: Home / Self Care | Payer: Medicaid Other | Source: Ambulatory Visit | Attending: Family Medicine | Admitting: Family Medicine

## 2022-05-26 ENCOUNTER — Encounter: Payer: Self-pay | Admitting: Family Medicine

## 2022-05-26 ENCOUNTER — Ambulatory Visit: Payer: Medicaid Other | Attending: Family Medicine | Admitting: Family Medicine

## 2022-05-26 VITALS — BP 161/89 | HR 77 | Temp 98.2°F | Ht 61.0 in | Wt 198.4 lb

## 2022-05-26 DIAGNOSIS — Z131 Encounter for screening for diabetes mellitus: Secondary | ICD-10-CM

## 2022-05-26 DIAGNOSIS — F17210 Nicotine dependence, cigarettes, uncomplicated: Secondary | ICD-10-CM

## 2022-05-26 DIAGNOSIS — Z0001 Encounter for general adult medical examination with abnormal findings: Secondary | ICD-10-CM

## 2022-05-26 DIAGNOSIS — M170 Bilateral primary osteoarthritis of knee: Secondary | ICD-10-CM | POA: Diagnosis not present

## 2022-05-26 DIAGNOSIS — K439 Ventral hernia without obstruction or gangrene: Secondary | ICD-10-CM | POA: Diagnosis not present

## 2022-05-26 DIAGNOSIS — Z0000 Encounter for general adult medical examination without abnormal findings: Secondary | ICD-10-CM

## 2022-05-26 DIAGNOSIS — I10 Essential (primary) hypertension: Secondary | ICD-10-CM

## 2022-05-26 DIAGNOSIS — Z124 Encounter for screening for malignant neoplasm of cervix: Secondary | ICD-10-CM | POA: Diagnosis not present

## 2022-05-26 DIAGNOSIS — Z1231 Encounter for screening mammogram for malignant neoplasm of breast: Secondary | ICD-10-CM

## 2022-05-26 DIAGNOSIS — Z1211 Encounter for screening for malignant neoplasm of colon: Secondary | ICD-10-CM

## 2022-05-26 DIAGNOSIS — Z7689 Persons encountering health services in other specified circumstances: Secondary | ICD-10-CM | POA: Diagnosis not present

## 2022-05-26 DIAGNOSIS — Z23 Encounter for immunization: Secondary | ICD-10-CM

## 2022-05-26 MED ORDER — AMLODIPINE BESYLATE 5 MG PO TABS: 5.0000 mg | ORAL_TABLET | Freq: Every day | ORAL | 1 refills | Status: DC

## 2022-05-26 NOTE — Patient Instructions (Signed)
Managing Your Hypertension Hypertension, also called high blood pressure, is when the force of the blood pressing against the walls of the arteries is too strong. Arteries are blood vessels that carry blood from your heart throughout your body. Hypertension forces the heart to work harder to pump blood and may cause the arteries to become narrow or stiff. Understanding blood pressure readings A blood pressure reading includes a higher number over a lower number: The first, or top, number is called the systolic pressure. It is a measure of the pressure in your arteries as your heart beats. The second, or bottom number, is called the diastolic pressure. It is a measure of the pressure in your arteries as the heart relaxes. For most people, a normal blood pressure is below 120/80. Your personal target blood pressure may vary depending on your medical conditions, your age, and other factors. Blood pressure is classified into four stages. Based on your blood pressure reading, your health care provider may use the following stages to determine what type of treatment you need, if any. Systolic pressure and diastolic pressure are measured in a unit called millimeters of mercury (mmHg). Normal Systolic pressure: below 120. Diastolic pressure: below 80. Elevated Systolic pressure: 120-129. Diastolic pressure: below 80. Hypertension stage 1 Systolic pressure: 130-139. Diastolic pressure: 80-89. Hypertension stage 2 Systolic pressure: 140 or above. Diastolic pressure: 90 or above. How can this condition affect me? Managing your hypertension is very important. Over time, hypertension can damage the arteries and decrease blood flow to parts of the body, including the brain, heart, and kidneys. Having untreated or uncontrolled hypertension can lead to: A heart attack. A stroke. A weakened blood vessel (aneurysm). Heart failure. Kidney damage. Eye damage. Memory and concentration problems. Vascular  dementia. What actions can I take to manage this condition? Hypertension can be managed by making lifestyle changes and possibly by taking medicines. Your health care provider will help you make a plan to bring your blood pressure within a normal range. You may be referred for counseling on a healthy diet and physical activity. Nutrition  Eat a diet that is high in fiber and potassium, and low in salt (sodium), added sugar, and fat. An example eating plan is called the DASH diet. DASH stands for Dietary Approaches to Stop Hypertension. To eat this way: Eat plenty of fresh fruits and vegetables. Try to fill one-half of your plate at each meal with fruits and vegetables. Eat whole grains, such as whole-wheat pasta, brown rice, or whole-grain bread. Fill about one-fourth of your plate with whole grains. Eat low-fat dairy products. Avoid fatty cuts of meat, processed or cured meats, and poultry with skin. Fill about one-fourth of your plate with lean proteins such as fish, chicken without skin, beans, eggs, and tofu. Avoid pre-made and processed foods. These tend to be higher in sodium, added sugar, and fat. Reduce your daily sodium intake. Many people with hypertension should eat less than 1,500 mg of sodium a day. Lifestyle  Work with your health care provider to maintain a healthy body weight or to lose weight. Ask what an ideal weight is for you. Get at least 30 minutes of exercise that causes your heart to beat faster (aerobic exercise) most days of the week. Activities may include walking, swimming, or biking. Include exercise to strengthen your muscles (resistance exercise), such as weight lifting, as part of your weekly exercise routine. Try to do these types of exercises for 30 minutes at least 3 days a week. Do   not use any products that contain nicotine or tobacco. These products include cigarettes, chewing tobacco, and vaping devices, such as e-cigarettes. If you need help quitting, ask your  health care provider. Control any long-term (chronic) conditions you have, such as high cholesterol or diabetes. Identify your sources of stress and find ways to manage stress. This may include meditation, deep breathing, or making time for fun activities. Alcohol use Do not drink alcohol if: Your health care provider tells you not to drink. You are pregnant, may be pregnant, or are planning to become pregnant. If you drink alcohol: Limit how much you have to: 0-1 drink a day for women. 0-2 drinks a day for men. Know how much alcohol is in your drink. In the [place] one drink equals one 12 oz bottle of beer (355 mL), one 5 oz glass of wine (148 mL), or one 1 oz glass of hard liquor (44 mL). Medicines Your health care provider may prescribe medicine if lifestyle changes are not enough to get your blood pressure under control and if: Your systolic blood pressure is 130 or higher. Your diastolic blood pressure is 80 or higher. Take medicines only as told by your health care provider. Follow the directions carefully. Blood pressure medicines must be taken as told by your health care provider. The medicine does not work as well when you skip doses. Skipping doses also puts you at risk for problems. Monitoring Before you monitor your blood pressure: Do not smoke, drink caffeinated beverages, or exercise within 30 minutes before taking a measurement. Use the bathroom and empty your bladder (urinate). Sit quietly for at least 5 minutes before taking measurements. Monitor your blood pressure at home as told by your health care provider. To do this: Sit with your back straight and supported. Place your feet flat on the floor. Do not cross your legs. Support your arm on a flat surface, such as a table. Make sure your upper arm is at heart level. Each time you measure, take two or three readings one minute apart and record the results. You may also need to have your blood pressure checked regularly by  your health care provider. General information Talk with your health care provider about your diet, exercise habits, and other lifestyle factors that may be contributing to hypertension. Review all the medicines you take with your health care provider because there may be side effects or interactions. Keep all follow-up visits. Your health care provider can help you create and adjust your plan for managing your high blood pressure. Where to find more information National Heart, Lung, and Blood Institute: www.nhlbi.nih.gov American Heart Association: www.heart.org Contact a health care provider if: You think you are having a reaction to medicines you have taken. You have repeated (recurrent) headaches. You feel dizzy. You have swelling in your ankles. You have trouble with your vision. Get help right away if: You develop a severe headache or confusion. You have unusual weakness or numbness, or you feel faint. You have severe pain in your chest or abdomen. You vomit repeatedly. You have trouble breathing. These symptoms may be an emergency. Get help right away. Call 911. Do not wait to see if the symptoms will go away. Do not drive yourself to the hospital. Summary Hypertension is when the force of blood pumping through your arteries is too strong. If this condition is not controlled, it may put you at risk for serious complications. Your personal target blood pressure may vary depending on your medical conditions,   your age, and other factors. For most people, a normal blood pressure is less than 120/80. Hypertension is managed by lifestyle changes, medicines, or both. Lifestyle changes to help manage hypertension include losing weight, eating a healthy, low-sodium diet, exercising more, stopping smoking, and limiting alcohol. This information is not intended to replace advice given to you by your health care provider. Make sure you discuss any questions you have with your health care  provider. Document Revised: 07/17/2021 Document Reviewed: 07/17/2021 [employer] Patient Education  2023 [employer]  

## 2022-05-26 NOTE — Progress Notes (Signed)
Subjective:  Patient ID: Samantha Evans, female    DOB: 08/02/1961  Age: 61 y.o. MRN: [id number]  CC: Annual Exam   HPI KHUSHBU PIPPEN is a 61 y.o. year old female with a history of hepatitis C, incarcerated umbilical hernia repair, hypertension.  She is due for Pap smear, mammogram and colonoscopy Last colonoscopy was in 05/2021 and she had a follow-up flexible sigmoidoscopy in 07/2021 with recommendation for 1 year recall due to presence of high risk polyp and concern for incompletely resected polyp.  Interval History:  Blood pressure is elevated and she is currently not on any antihypertensive.  Spironolactone appears on her med list but she states she has not been taking it.  She has a hernia in her umbilicus and she has associated nausea but no vomiting and it can be painful. Every morning when she wakes up she has to reduce it. She is asymptomatic now.  She complains of pain in her knees on rising from a sitting positing for which she has been using ibuprofen 400 mg with mild relief.  She also has pain in her ankles and wrists.  Her sister received cortisone injections in her knees and she would like to be referred for this. Past Medical History:  Diagnosis Date   Arthritis    Carpal tunnel syndrome    Cataracts, bilateral    Cirrhosis ([place])    Hypertension     Past Surgical History:  Procedure Laterality Date   CATARACT EXTRACTION Bilateral    ERCP     UMBILICAL HERNIA REPAIR N/A 02/27/2021   Procedure: REPAIR INCARCERATED UMBILICAL HERNIA;  Surgeon: Georganna Skeans, MD;  Location: [facility];  Service: General;  Laterality: N/A;    Family History  Problem Relation Age of Onset   Other Mother        Hit by a train while in her truck   Lung cancer Father    Liver disease Neg Hx    Colon cancer Neg Hx    Esophageal cancer Neg Hx    Pancreatic cancer Neg Hx    Stomach cancer Neg Hx     Social History   Socioeconomic History   Marital status: Legally Separated     Spouse name: Not on file   Number of children: 4   Years of education: Not on file   Highest education level: Not on file  Occupational History   Occupation: unemployed  Tobacco Use   Smoking status: Every Day    Packs/day: 0.50    Years: 40.00    Total pack years: 20.00    Types: Cigarettes   Smokeless tobacco: Never   Tobacco comments:    Quit smoking on 02/27/2021 when hernia repair surgery occurred.  Back to smoking .5 pack of cigarettes daily  Vaping Use   Vaping Use: Never used  Substance and Sexual Activity   Alcohol use: Not Currently   Drug use: Not Currently   Sexual activity: Not on file  Other Topics Concern   Not on file  Social History Narrative   Not on file   Social Determinants of Health   Financial Resource Strain: Low Risk  (07/22/2021)   Overall Financial Resource Strain (CARDIA)    Difficulty of Paying Living Expenses: Not hard at all  Food Insecurity: No Food Insecurity (07/22/2021)   Hunger Vital Sign    Worried About Running Out of Food in the Last Year: Never true    [place] in the Last  Year: Never true  Transportation Needs: Unmet Transportation Needs (09/02/2021)   PRAPARE - Hydrologist (Medical): Yes    Lack of Transportation (Non-Medical): Yes  Physical Activity: Inactive (07/22/2021)   Exercise Vital Sign    Days of Exercise per Week: 0 days    Minutes of Exercise per Session: 0 min  Stress: Stress Concern Present (07/22/2021)   [place]    Feeling of Stress : To some extent  Social Connections: Socially Isolated (07/22/2021)   Social Connection and Isolation Panel [NHANES]    Frequency of Communication with Friends and Family: More than three times a week    Frequency of Social Gatherings with Friends and Family: More than three times a week    Attends Religious Services: Never    Marine scientist or Organizations: No    Attends Theatre manager Meetings: Never    Marital Status: Separated    No Known Allergies  Outpatient Medications Prior to Visit  Medication Sig Dispense Refill   CALCIUM CITRATE PO Take 1 tablet by mouth See admin instructions. Take 1 tablet by mouth every 3-4 days. (Patient not taking: Reported on 05/26/2022)     ibuprofen (ADVIL) 200 MG tablet Take 400 mg by mouth daily as needed for headache. (Patient not taking: Reported on 05/26/2022)     Psyllium (METAMUCIL PO) Take 16 oz by mouth See admin instructions. Take 16 oz by mouth every 2 days. (Patient not taking: Reported on 05/26/2022)     spironolactone (ALDACTONE) 50 MG tablet Take 1 tablet (50 mg total) by mouth daily. (Patient not taking: Reported on 05/26/2022) 30 tablet 5   No facility-administered medications prior to visit.     ROS Review of Systems  Constitutional:  Negative for activity change and appetite change.  HENT:  Negative for sinus pressure and sore throat.   Respiratory:  Negative for chest tightness, shortness of breath and wheezing.   Cardiovascular:  Negative for chest pain and palpitations.  Gastrointestinal:  Positive for nausea. Negative for abdominal distention, abdominal pain and constipation.  Genitourinary: Negative.   Musculoskeletal:        See HPI  Psychiatric/Behavioral:  Negative for behavioral problems and dysphoric mood.     Objective:  BP (!) 161/89   Pulse 77   Temp 98.2 F (36.8 C) (Oral)   Ht 5' 1" (1.549 m)   Wt 198 lb 6.4 oz (90 kg)   SpO2 95%   BMI 37.49 kg/m      05/26/2022   10:37 AM 03/26/2022    2:32 PM 03/13/2022   11:58 AM  BP/Weight  Systolic BP 025 852 778  Diastolic BP 89 81 66  Wt. (Lbs) 198.4 202   BMI 37.49 kg/m2 39.45 kg/m2       Physical Exam Exam conducted with a chaperone present.  Constitutional:      General: She is not in acute distress.    Appearance: She is well-developed. She is not diaphoretic.  HENT:     Head: Normocephalic.     Right Ear: External ear  normal.     Left Ear: External ear normal.     Nose: Nose normal.  Eyes:     Conjunctiva/sclera: Conjunctivae normal.     Pupils: Pupils are equal, round, and reactive to light.  Neck:     Vascular: No JVD.  Cardiovascular:     Rate and Rhythm: Normal rate and  regular rhythm.     Heart sounds: Normal heart sounds. No murmur heard.    No gallop.  Pulmonary:     Effort: Pulmonary effort is normal. No respiratory distress.     Breath sounds: Normal breath sounds. No wheezing or rales.  Chest:     Chest wall: No tenderness.  Breasts:    Right: Normal. No mass, nipple discharge or tenderness.     Left: Normal. No mass, nipple discharge or tenderness.  Abdominal:     General: Bowel sounds are normal. There is no distension.     Palpations: Abdomen is soft. There is no mass.     Tenderness: There is no abdominal tenderness.     Hernia: A hernia (ventral, not TTP) is present. There is no hernia in the left inguinal area or right inguinal area.  Genitourinary:    General: Normal vulva.     Pubic Area: No rash.      Labia:        Right: No rash.        Left: No rash.      Vagina: Normal.     Cervix: Normal.     Uterus: Normal.      Adnexa: Right adnexa normal and left adnexa normal.       Right: No tenderness.         Left: No tenderness.    Musculoskeletal:        General: No tenderness. Normal range of motion.     Cervical back: Normal range of motion. No tenderness.  Lymphadenopathy:     Upper Body:     Right upper body: No supraclavicular or axillary adenopathy.     Left upper body: No supraclavicular or axillary adenopathy.  Skin:    General: Skin is warm and dry.  Neurological:     Mental Status: She is alert and oriented to person, place, and time.     Deep Tendon Reflexes: Reflexes are normal and symmetric.        Latest Ref Rng & Units 03/13/2022    7:50 AM 01/16/2022    4:10 PM 09/08/2021   11:53 AM  CMP  Glucose 70 - 99 mg/dL 107  88  70   BUN 8 - 23 mg/dL _0 Creatinine 0.44 - 1.00 mg/dL 0.87  0.86  0.79   Sodium 135 - 145 mmol/L 135  141  140   Potassium 3.5 - 5.1 mmol/L 4.0  4.0  4.3   Chloride 98 - 111 mmol/L 107  107  109   CO2 22 - 32 mmol/L _1 Calcium 8.9 - 10.3 mg/dL 10.8  11.1  10.8   Total Protein 6.5 - 8.1 g/dL 7.0  7.4  7.4   Total Bilirubin 0.3 - 1.2 mg/dL 0.7  0.8  1.0   Alkaline Phos 38 - 126 U/L 108  114  115   AST 15 - 41 U/L _2 ALT 0 - 44 U/L _3 Lipid Panel  No results found for: "CHOL", "TRIG", "HDL", "CHOLHDL", "VLDL", "LDLCALC", "LDLDIRECT"  CBC    Component Value Date/Time   WBC 5.0 03/13/2022 0750   RBC 4.14 03/13/2022 0750   HGB 11.8 (L) 03/13/2022 0750   HCT 36.0 03/13/2022 0750   PLT 120 (L) 03/13/2022 0750   MCV 87.0 03/13/2022 0750   MCH 28.5 03/13/2022 0750  MCHC 32.8 03/13/2022 0750   RDW 15.0 03/13/2022 0750   LYMPHSABS 1.0 01/16/2022 1610   MONOABS 0.4 01/16/2022 1610   EOSABS 0.2 01/16/2022 1610   BASOSABS 0.0 01/16/2022 1610    No results found for: "HGBA1C"  Assessment & Plan:  1. Annual physical exam Counseled on 150 minutes of exercise per week, healthy eating (including decreased daily intake of saturated fats, cholesterol, added sugars, sodium), routine healthcare maintenance.  - CBC with Differential/Platelet  2. Encounter for screening mammogram for malignant neoplasm of breast - MM DIGITAL SCREENING BILATERAL; Future  3. Screening for cervical cancer - Cytology - PAP  4. Need for shingles vaccine - Varicella-zoster vaccine IM  5. Screening for colon cancer - Ambulatory referral to Gastroenterology  6. Screening for diabetes mellitus - Hemoglobin A1c  7. Ventral hernia without obstruction or gangrene No evidence of obstruction - Ambulatory referral to General Surgery  8. Primary osteoarthritis of both knees Uncontrolled on NSAID Advised to increase ibuprofen from 400 mg to 600 mg - AMB referral to orthopedics  9. Smoking  greater than 20 pack years Spent 3 minutes counseling on smoking cessation and she is working on cutting back - [place]; Future  10. Essential hypertension Uncontrolled Currently not on antihypertensive We will start amlodipine Counseled on blood pressure goal of less than 130/80, low-sodium, DASH diet, medication compliance, 150 minutes of moderate intensity exercise per week. Discussed medication compliance, adverse effects. - LP+Non-HDL Cholesterol - CMP14+EGFR - amLODipine (NORVASC) 5 MG tablet; Take 1 tablet (5 mg total) by mouth daily.  Dispense: 90 tablet; Refill: 1  Meds ordered this encounter  Medications   amLODipine (NORVASC) 5 MG tablet    Sig: Take 1 tablet (5 mg total) by mouth daily.    Dispense:  90 tablet    Refill:  1    Follow-up: Return in about 6 weeks (around 07/07/2022) for Blood Pressure follow-up.       [name], MD, FAAFP. [facility] [place], [place]   05/26/2022, 12:25 PM

## 2022-05-27 ENCOUNTER — Other Ambulatory Visit: Payer: Self-pay | Admitting: Family Medicine

## 2022-05-27 LAB — CBC WITH DIFFERENTIAL/PLATELET
Basophils Absolute: 0 10*3/uL (ref 0.0–0.2)
Basos: 1 %
EOS (ABSOLUTE): 0.1 10*3/uL (ref 0.0–0.4)
Eos: 3 %
Hematocrit: 35.9 % (ref 34.0–46.6)
Hemoglobin: 12.5 g/dL (ref 11.1–15.9)
Immature Grans (Abs): 0 10*3/uL (ref 0.0–0.1)
Immature Granulocytes: 0 %
Lymphocytes Absolute: 1.1 10*3/uL (ref 0.7–3.1)
Lymphs: 25 %
MCH: 29.5 pg (ref 26.6–33.0)
MCHC: 34.8 g/dL (ref 31.5–35.7)
MCV: 85 fL (ref 79–97)
Monocytes Absolute: 0.4 10*3/uL (ref 0.1–0.9)
Monocytes: 9 %
Neutrophils Absolute: 2.7 10*3/uL (ref 1.4–7.0)
Neutrophils: 62 %
Platelets: 130 10*3/uL — ABNORMAL LOW (ref 150–450)
RBC: 4.24 x10E6/uL (ref 3.77–5.28)
RDW: 14.5 % (ref 11.7–15.4)
WBC: 4.3 10*3/uL (ref 3.4–10.8)

## 2022-05-27 LAB — CMP14+EGFR
ALT: 18 IU/L (ref 0–32)
AST: 33 IU/L (ref 0–40)
Albumin/Globulin Ratio: 1.4 (ref 1.2–2.2)
Albumin: 4.2 g/dL (ref 3.9–4.9)
Alkaline Phosphatase: 124 IU/L — ABNORMAL HIGH (ref 44–121)
BUN/Creatinine Ratio: 21 (ref 12–28)
BUN: 19 mg/dL (ref 8–27)
Bilirubin Total: 0.9 mg/dL (ref 0.0–1.2)
CO2: 22 mmol/L (ref 20–29)
Calcium: 10.9 mg/dL — ABNORMAL HIGH (ref 8.7–10.3)
Chloride: 104 mmol/L (ref 96–106)
Creatinine, Ser: 0.9 mg/dL (ref 0.57–1.00)
Globulin, Total: 3.1 g/dL (ref 1.5–4.5)
Glucose: 89 mg/dL (ref 70–99)
Potassium: 4.1 mmol/L (ref 3.5–5.2)
Sodium: 139 mmol/L (ref 134–144)
Total Protein: 7.3 g/dL (ref 6.0–8.5)
eGFR: 73 mL/min/{1.73_m2} (ref >59)

## 2022-05-27 LAB — LP+NON-HDL CHOLESTEROL
Cholesterol, Total: 144 mg/dL (ref 100–199)
HDL: 40 mg/dL (ref >39)
LDL Chol Calc (NIH): 86 mg/dL (ref 0–99)
Total Non-HDL-Chol (LDL+VLDL): 104 mg/dL (ref 0–129)
Triglycerides: 98 mg/dL (ref 0–149)
VLDL Cholesterol Cal: 18 mg/dL (ref 5–40)

## 2022-05-27 LAB — HEMOGLOBIN A1C
Est. average glucose Bld gHb Est-mCnc: 91 mg/dL
Hgb A1c MFr Bld: 4.8 % (ref 4.8–5.6)

## 2022-06-01 LAB — CYTOLOGY - PAP
Comment: NEGATIVE
Diagnosis: NEGATIVE
High risk HPV: NEGATIVE

## 2022-06-02 ENCOUNTER — Ambulatory Visit: Payer: Medicaid Other | Admitting: Orthopaedic Surgery

## 2022-06-04 ENCOUNTER — Encounter: Payer: Self-pay | Admitting: Orthopaedic Surgery

## 2022-06-04 ENCOUNTER — Ambulatory Visit (INDEPENDENT_AMBULATORY_CARE_PROVIDER_SITE_OTHER): Payer: Medicaid Other | Admitting: Orthopaedic Surgery

## 2022-06-04 ENCOUNTER — Ambulatory Visit (INDEPENDENT_AMBULATORY_CARE_PROVIDER_SITE_OTHER): Payer: Medicaid Other

## 2022-06-04 VITALS — Ht 61.0 in | Wt 198.0 lb

## 2022-06-04 DIAGNOSIS — M25561 Pain in right knee: Secondary | ICD-10-CM | POA: Diagnosis not present

## 2022-06-04 DIAGNOSIS — G8929 Other chronic pain: Secondary | ICD-10-CM

## 2022-06-04 DIAGNOSIS — M1711 Unilateral primary osteoarthritis, right knee: Secondary | ICD-10-CM | POA: Diagnosis not present

## 2022-06-04 DIAGNOSIS — Z7689 Persons encountering health services in other specified circumstances: Secondary | ICD-10-CM | POA: Diagnosis not present

## 2022-06-04 MED ORDER — METHYLPREDNISOLONE ACETATE 40 MG/ML IJ SUSP
80.0000 mg | INTRAMUSCULAR | Status: AC | PRN
  Administered 2022-06-04: 80 mg via INTRA_ARTICULAR

## 2022-06-04 MED ORDER — BUPIVACAINE HCL 0.25 % IJ SOLN
2.0000 mL | INTRAMUSCULAR | Status: AC | PRN
  Administered 2022-06-04: 2 mL via INTRA_ARTICULAR

## 2022-06-04 MED ORDER — LIDOCAINE HCL 1 % IJ SOLN
2.0000 mL | INTRAMUSCULAR | Status: AC | PRN
  Administered 2022-06-04: 2 mL

## 2022-06-04 NOTE — Progress Notes (Signed)
Office Visit Note   Patient: Samantha Evans           Date of Birth: 10-05-1961           MRN: [id number] Visit Date: 06/04/2022              Requested by: [name], MD [address],  [place] [address] PCP: [name], MD   Assessment & Plan: Visit Diagnoses:  1. Chronic pain of right knee   2. Unilateral primary osteoarthritis, right knee     Plan: Patient is a pleasant 61 year old woman who presents with a 4-year history of right knee pain.  She has a remote history of a proximal fibula fracture over 10 years ago.  Over the years she has tried ibuprofen and various topical medications such as Voltaren gel and IcyHot.  She comes in today because the pain is getting more persistent rather than occasional.  Focuses her pain on the lateral side of her right knee.  Although her medial joint line is more narrowed and her x-rays certainly there is findings consistent with CPPD that and the previous trauma to her lateral knee have made the lateral compartment more symptomatic.  We suggested trying a steroid injection today and she would like to go forward with this.  May follow-up if she does not get good resolution of her symptoms  Follow-Up Instructions: As needed if symptoms do not improve  Orders:  Orders Placed This Encounter  Procedures   Large Joint Inj: R knee   XR KNEE 3 VIEW RIGHT   No orders of the defined types were placed in this encounter.     Procedures: Large Joint Inj: R knee on 06/04/2022 4:16 PM Indications: pain and diagnostic evaluation Details: 25 G 1.5 in needle, anterolateral approach  Arthrogram: No  Medications: 80 mg methylPREDNISolone acetate 40 MG/ML; 2 mL lidocaine 1 %; 2 mL bupivacaine 0.25 % Outcome: tolerated well, no immediate complications Procedure, treatment alternatives, risks and benefits explained, specific risks discussed. Consent was given by the patient.      Clinical Data: No additional  findings.   Subjective: Chief Complaint  Patient presents with   Right Knee - New Patient (Initial Visit)   Patient presents today as a new patient for her right knee pain. Patient states that she noticed knee pain around 4 years ago however this pain has gradually gotten worse within the past year. Patient denies having any injuries or falls to have started the pain nor has she had any prev knee surgeries. She denies using any knee bracing to help with discomfort. Ibuprofen is being used at this time.   Review of Systems  All other systems reviewed and are negative.    Objective: Vital Signs: Ht '5\' 1"'$  (1.549 m)   Wt 198 lb (89.8 kg)   BMI 37.41 kg/m   Physical Exam Constitutional:      Appearance: Normal appearance.  Pulmonary:     Effort: Pulmonary effort is normal.  Skin:    General: Skin is warm and dry.  Neurological:     Mental Status: She is alert.     Ortho Exam Examination of her right knee.  No warmth or effusion.  She has no tenderness over the medial joint line but does have tenderness to palpation over the lateral joint line.  Good varus and valgus stability.  Good endpoint on anterior draw Specialty Comments:  No specialty comments available.  Imaging: XR KNEE 3  VIEW RIGHT  Result Date: 06/04/2022 Three-view radiographs of her right knee were reviewed today.  She does have subchondral sclerosis and joint space narrowing over the medial joint more than the lateral joint.  Also has calcification could be consistent with CPPD.  No acute fractures no acute osseous injuries.  She does have a healed fracture of the proximal fibula.  Films are consistent with tricompartmental degenerative arthritis with predominant findings in the medial compartment    PMFS History: Patient Active Problem List   Diagnosis Date Noted   Unilateral primary osteoarthritis, right knee 06/04/2022   Peripheral edema 05/12/2021   Chronic hepatitis C without hepatic coma ([place])  03/25/2021   Acid reflux 03/25/2021   Incarcerated umbilical hernia 65/79/0383   Small bowel obstruction ([place]) 02/27/2021   Hepatic cirrhosis ([place])- by CT scan 02/27/2021   AKI (acute kidney injury) ([place]) 02/27/2021   Past Medical History:  Diagnosis Date   Arthritis    Carpal tunnel syndrome    Cataracts, bilateral    Cirrhosis ([place])    Hypertension     Family History  Problem Relation Age of Onset   Other Mother        Hit by a train while in her truck   Lung cancer Father    Liver disease Neg Hx    Colon cancer Neg Hx    Esophageal cancer Neg Hx    Pancreatic cancer Neg Hx    Stomach cancer Neg Hx     Past Surgical History:  Procedure Laterality Date   CATARACT EXTRACTION Bilateral    ERCP     UMBILICAL HERNIA REPAIR N/A 02/27/2021   Procedure: REPAIR INCARCERATED UMBILICAL HERNIA;  Surgeon: Georganna Skeans, MD;  Location: [place];  Service: General;  Laterality: N/A;   Social History   Occupational History   Occupation: unemployed  Tobacco Use   Smoking status: Every Day    Packs/day: 0.50    Years: 40.00    Total pack years: 20.00    Types: Cigarettes   Smokeless tobacco: Never   Tobacco comments:    Quit smoking on 02/27/2021 when hernia repair surgery occurred.  Back to smoking .5 pack of cigarettes daily  Vaping Use   Vaping Use: Never used  Substance and Sexual Activity   Alcohol use: Not Currently   Drug use: Not Currently   Sexual activity: Not on file

## 2022-06-07 ENCOUNTER — Emergency Department (HOSPITAL_COMMUNITY)
Admission: EM | Admit: 2022-06-07 | Discharge: 2022-06-08 | Disposition: A | Discharge status: Home / Self Care | Payer: Medicaid Other | Attending: Emergency Medicine | Admitting: Emergency Medicine

## 2022-06-07 ENCOUNTER — Encounter (HOSPITAL_COMMUNITY): Payer: Self-pay | Admitting: Emergency Medicine

## 2022-06-07 ENCOUNTER — Other Ambulatory Visit: Payer: Self-pay

## 2022-06-07 DIAGNOSIS — K5730 Diverticulosis of large intestine without perforation or abscess without bleeding: Secondary | ICD-10-CM | POA: Diagnosis not present

## 2022-06-07 DIAGNOSIS — Z79899 Other long term (current) drug therapy: Secondary | ICD-10-CM | POA: Diagnosis not present

## 2022-06-07 DIAGNOSIS — K439 Ventral hernia without obstruction or gangrene: Secondary | ICD-10-CM | POA: Diagnosis not present

## 2022-06-07 DIAGNOSIS — I10 Essential (primary) hypertension: Secondary | ICD-10-CM | POA: Diagnosis not present

## 2022-06-07 DIAGNOSIS — K7460 Unspecified cirrhosis of liver: Secondary | ICD-10-CM | POA: Diagnosis not present

## 2022-06-07 DIAGNOSIS — R109 Unspecified abdominal pain: Secondary | ICD-10-CM | POA: Diagnosis present

## 2022-06-07 LAB — COMPREHENSIVE METABOLIC PANEL
ALT: 21 U/L (ref 0–44)
AST: 33 U/L (ref 15–41)
Albumin: 3.4 g/dL — ABNORMAL LOW (ref 3.5–5.0)
Alkaline Phosphatase: 106 U/L (ref 38–126)
Anion gap: 6 (ref 5–15)
BUN: 22 mg/dL (ref 8–23)
CO2: 24 mmol/L (ref 22–32)
Calcium: 10.8 mg/dL — ABNORMAL HIGH (ref 8.9–10.3)
Chloride: 106 mmol/L (ref 98–111)
Creatinine, Ser: 0.9 mg/dL (ref 0.44–1.00)
GFR, Estimated: >60 mL/min (ref >60)
Glucose, Bld: 133 mg/dL — ABNORMAL HIGH (ref 70–99)
Potassium: 4.3 mmol/L (ref 3.5–5.1)
Sodium: 136 mmol/L (ref 135–145)
Total Bilirubin: 0.8 mg/dL (ref 0.3–1.2)
Total Protein: 7.2 g/dL (ref 6.5–8.1)

## 2022-06-07 LAB — URINALYSIS, ROUTINE W REFLEX MICROSCOPIC
Bilirubin Urine: NEGATIVE
Glucose, UA: NEGATIVE mg/dL
Ketones, ur: NEGATIVE mg/dL
Leukocytes,Ua: NEGATIVE
Nitrite: NEGATIVE
Protein, ur: NEGATIVE mg/dL
Specific Gravity, Urine: 1.01 (ref 1.005–1.030)
pH: 5 (ref 5.0–8.0)

## 2022-06-07 LAB — CBC
HCT: 36.5 % (ref 36.0–46.0)
Hemoglobin: 12.4 g/dL (ref 12.0–15.0)
MCH: 29.5 pg (ref 26.0–34.0)
MCHC: 34 g/dL (ref 30.0–36.0)
MCV: 86.9 fL (ref 80.0–100.0)
Platelets: 119 10*3/uL — ABNORMAL LOW (ref 150–400)
RBC: 4.2 MIL/uL (ref 3.87–5.11)
RDW: 14.9 % (ref 11.5–15.5)
WBC: 5.5 10*3/uL (ref 4.0–10.5)
nRBC: 0 % (ref 0.0–0.2)

## 2022-06-07 LAB — LIPASE, BLOOD: Lipase: 79 U/L — ABNORMAL HIGH (ref 11–51)

## 2022-06-07 MED ORDER — OXYCODONE-ACETAMINOPHEN 5-325 MG PO TABS
1.0000 | ORAL_TABLET | Freq: Once | ORAL | Status: AC
  Administered 2022-06-07: 1 via ORAL
  Filled 2022-06-07: qty 1

## 2022-06-07 NOTE — ED Triage Notes (Signed)
Patient reports mid abdominal pain onset yesterday , no emesis or diarrhea , history of ventral hernia .

## 2022-06-08 ENCOUNTER — Emergency Department (HOSPITAL_COMMUNITY): Payer: Medicaid Other

## 2022-06-08 DIAGNOSIS — K7460 Unspecified cirrhosis of liver: Secondary | ICD-10-CM | POA: Diagnosis not present

## 2022-06-08 DIAGNOSIS — K5730 Diverticulosis of large intestine without perforation or abscess without bleeding: Secondary | ICD-10-CM | POA: Diagnosis not present

## 2022-06-08 LAB — LACTIC ACID, PLASMA: Lactic Acid, Venous: 0.8 mmol/L (ref 0.5–1.9)

## 2022-06-08 MED ORDER — IOHEXOL 300 MG/ML  SOLN
100.0000 mL | Freq: Once | INTRAMUSCULAR | Status: AC | PRN
  Administered 2022-06-08: 100 mL via INTRAVENOUS

## 2022-06-08 NOTE — Discharge Instructions (Addendum)
You have a hernia which I was able to reduce, please continue with all home medications.  Please follow-up with general surgery for further evaluation  Please come back if you have significant abdominal pain with uncontrolled nausea vomiting and unable to pass gas or have a bowel movement as is concerning symptoms.

## 2022-06-08 NOTE — ED Provider Notes (Signed)
[facility] EMERGENCY DEPARTMENT Provider Note   CSN: [id number] Arrival date & time: 06/07/22  2113     History  Chief Complaint  Patient presents with   Abdominal Pain    HORACE WISHON is a 61 y.o. female.  HPI  Medical history including hypertension, arthritis, umbilical hernia repair, cholecystectomy presents with complaints of stomach pain.  Patient states that pain started on Saturday, came on suddenly, pain remains in that 1 area, does not radiate, she is concerned this coming from her hernia.  She states that she has had nausea without vomiting, she still passing gas having small bowel movements, denies any urinary symptoms, she is not having other complaints.    Home Medications Prior to Admission medications   Medication Sig Start Date End Date Taking? Authorizing Provider  amLODipine (NORVASC) 5 MG tablet Take 1 tablet (5 mg total) by mouth daily. 05/26/22   [name], MD  CALCIUM CITRATE PO Take 1 tablet by mouth See admin instructions. Take 1 tablet by mouth every 3-4 days. Patient not taking: Reported on 05/26/2022    [provider]  ibuprofen (ADVIL) 200 MG tablet Take 400 mg by mouth daily as needed for headache. Patient not taking: Reported on 05/26/2022    [provider]  Psyllium (METAMUCIL PO) Take 16 oz by mouth See admin instructions. Take 16 oz by mouth every 2 days. Patient not taking: Reported on 05/26/2022    [provider]  spironolactone (ALDACTONE) 50 MG tablet Take 1 tablet (50 mg total) by mouth daily. Patient not taking: Reported on 05/26/2022 09/08/21   [name], MD      Allergies    Patient has no known allergies.    Review of Systems   Review of Systems  Constitutional:  Negative for chills and fever.  Respiratory:  Negative for shortness of breath.   Cardiovascular:  Negative for chest pain.  Gastrointestinal:  Positive for abdominal pain and nausea. Negative for constipation,  diarrhea and vomiting.  Neurological:  Negative for headaches.    Physical Exam Updated Vital Signs BP (!) 184/79 (BP Location: Left Arm)   Pulse 68   Temp 97.8 F (36.6 C)   Resp 19   SpO2 97%  Physical Exam Vitals and nursing note reviewed.  Constitutional:      General: She is not in acute distress.    Appearance: She is not ill-appearing.  HENT:     Head: Normocephalic and atraumatic.     Nose: No congestion.  Eyes:     Conjunctiva/sclera: Conjunctivae normal.  Cardiovascular:     Rate and Rhythm: Normal rate and regular rhythm.     Pulses: Normal pulses.  Pulmonary:     Effort: Pulmonary effort is normal.  Abdominal:     Palpations: Abdomen is soft.     Tenderness: There is abdominal tenderness. There is no right CVA tenderness or left CVA tenderness.     Comments: Nondistended, soft, noted ventral hernia, tenderness in the area, without guarding rebound has peritoneal sign negative Murphy sign McBurney point no CVA tenderness.  Musculoskeletal:     Right lower leg: No edema.     Left lower leg: No edema.  Skin:    General: Skin is warm and dry.  Neurological:     Mental Status: She is alert.  Psychiatric:        Mood and Affect: Mood normal.     ED Results / Procedures / Treatments   Labs (all  labs ordered are listed, but only abnormal results are displayed) Labs Reviewed  LIPASE, BLOOD - Abnormal; Notable for the following components:      Result Value   Lipase 79 (*)    All other components within normal limits  COMPREHENSIVE METABOLIC PANEL - Abnormal; Notable for the following components:   Glucose, Bld 133 (*)    Calcium 10.8 (*)    Albumin 3.4 (*)    All other components within normal limits  CBC - Abnormal; Notable for the following components:   Platelets 119 (*)    All other components within normal limits  URINALYSIS, ROUTINE W REFLEX MICROSCOPIC - Abnormal; Notable for the following components:   Hgb urine dipstick MODERATE (*)     Bacteria, UA RARE (*)    All other components within normal limits  LACTIC ACID, PLASMA    EKG None  Radiology CT ABDOMEN PELVIS W CONTRAST  Result Date: 06/08/2022 CLINICAL DATA:  Acute abdominal pain EXAM: CT ABDOMEN AND PELVIS WITH CONTRAST TECHNIQUE: Multidetector CT imaging of the abdomen and pelvis was performed using the standard protocol following bolus administration of intravenous contrast. RADIATION DOSE REDUCTION: This exam was performed according to the departmental dose-optimization program which includes automated exposure control, adjustment of the mA and/or kV according to patient size and/or use of iterative reconstruction technique. CONTRAST:  134mL OMNIPAQUE IOHEXOL 300 MG/ML  SOLN COMPARISON:  None Available. FINDINGS: Lower Chest: Normal. Hepatobiliary: Diffusely nodular hepatic contours, consistent with hepatic cirrhosis. No focal liver lesion. There is unchanged intrahepatic biliary dilatation. Unchanged cyst in the left hepatic lobe. Status post cholecystectomy. Pancreas: Normal pancreas. No ductal dilatation or peripancreatic fluid collection. Spleen: Normal. Adrenals/Urinary Tract: The adrenal glands are normal. No hydronephrosis, nephroureterolithiasis or solid renal mass. The urinary bladder is normal for degree of distention Stomach/Bowel: There is no hiatal hernia. Normal duodenal course and caliber. There is a ventral abdominal hernia that contains a loop of small bowel with mild adjacent fat stranding and a small amount of fluid in the hernia sac. No proximal dilatation. Slightly superiorly, there is a second fat containing ventral hernia. Rectosigmoid diverticulosis without acute inflammation. Normal appendix. Vascular/Lymphatic: There is calcific atherosclerosis of the abdominal aorta. No lymphadenopathy. Reproductive: Normal uterus. No adnexal mass. Other: None. Musculoskeletal: No bony spinal canal stenosis or focal osseous abnormality. IMPRESSION: 1. Ventral abdominal  hernia that contains a loop of small bowel with mild adjacent fat stranding and a small amount of fluid in the hernia sac. This may indicate early strangulation. 2. Hepatic cirrhosis. 3. Rectosigmoid diverticulosis without acute inflammation. 4. Aortic Atherosclerosis (ICD10-I70.0). Electronically Signed   By: Ulyses Jarred M.D.   On: 06/08/2022 01:38    Procedures Hernia reduction  Date/Time: 06/08/2022 3:34 AM  Performed by: Marcello Fennel, PA-C Authorized by: Marcello Fennel, PA-C  Consent: Verbal consent obtained. Risks and benefits: risks, benefits and alternatives were discussed Consent given by: patient Required items: required blood products, implants, devices, and special equipment available Patient identity confirmed: verbally with patient Time out: Immediately prior to procedure a "time out" was called to verify the correct patient, procedure, equipment, support staff and site/side marked as required. Preparation: Patient was prepped and draped in the usual sterile fashion. Local anesthesia used: no  Anesthesia: Local anesthesia used: no  Sedation: Patient sedated: no  Patient tolerance: patient tolerated the procedure well with no immediate complications       Medications Ordered in ED Medications  oxyCODONE-acetaminophen (PERCOCET/ROXICET) 5-325 MG per tablet 1 tablet (  1 tablet Oral Given 06/07/22 2126)  iohexol (OMNIPAQUE) 300 MG/ML solution 100 mL (100 mLs Intravenous Contrast Given 06/08/22 0119)    ED Course/ Medical Decision Making/ A&P                           Medical Decision Making Amount and/or Complexity of Data Reviewed Labs: ordered.   This patient presents to the ED for concern of stomach pain, this involves an extensive number of treatment options, and is a complaint that carries with it a high risk of complications and morbidity.  The differential diagnosis includes strangulated/incarcerated hernia, bowel obstruction, volvulus,  diverticulitis    Additional history obtained:  Additional history obtained from N/A External records from outside source obtained and reviewed including previous ER notes   Co morbidities that complicate the patient evaluation  Previous hernia repair, cholecystectomy  Social Determinants of Health:  N/A    Lab Tests:  I Ordered, and personally interpreted labs.  The pertinent results include: CBC unremarkable, CMP shows glucose of 133 calcium 10.8 albumin 3.4, lipase is 79 UA is unremarkable   Imaging Studies ordered:  I ordered imaging studies including CT imaging I independently visualized and interpreted imaging which showed ventral hernia containing loops of small bowel, with adjacent fat stranding concern for possible early strangulation I agree with the radiologist interpretation   Cardiac Monitoring:  The patient was maintained on a cardiac monitor.  I personally viewed and interpreted the cardiac monitored which showed an underlying rhythm of: N/A   Medicines ordered and prescription drug management:  I ordered medication including oxycodone for pain I have reviewed the patients home medicines and have made adjustments as needed  Critical Interventions:  N/A   Reevaluation:  Presents with abdominal pain, triage obtain lab work imaging of appears reviewed, concern for possible ventral hernia, on my exam patient had a notable ventral hernia, I was able to reduce the hernia.  Patient is in agreement with plan discharge at this time.  Consultations Obtained:  N/A    Test Considered:  N/A    Rule out I have low suspicion for hepatic biliary abnormality as she has no right upper quadrant tenderness, liver enzymes, alk phos, T bili all within normal limits.  Low suspicion for pancreatitis as she has no epigastric tenderness, lipase is only mildly elevated at 78, no CT findings concerning for this.  Low suspicion for ruptured stomach ulcer as she has  no peritoneal sign present on exam.  Low suspicion for bowel obstruction as abdomen is nondistended normal bowel sounds, so passing gas and having normal bowel movements.  Low suspicion for complicated diverticulitis as she is nontoxic-appearing, vital signs reassuring no leukocytosis present.  Low suspicion for appendicitis as she has no right lower quadrant tenderness, vital signs reassuring.  Low suspicion for AAA or dissection presentation atypical etiology.  Low suspicion for strangulated or incarcerated hernia as hernia was easily reducible.   Dispostion and problem list  After consideration of the diagnostic results and the patients response to treatment, I feel that the patent would benefit from discharge.  Ventral hernia-likely cause of her pain, will have her follow-up with general surgery for further evaluation.  Strict return precautions.            Final Clinical Impression(s) / ED Diagnoses Final diagnoses:  Ventral hernia without obstruction or gangrene    Rx / DC Orders ED Discharge Orders     None  Marcello Fennel, PA-C 41/42/39 [id number]    Delora Fuel, MD 23/34/35 [phone]

## 2022-06-09 ENCOUNTER — Encounter: Payer: Self-pay | Admitting: Gastroenterology

## 2022-06-16 ENCOUNTER — Ambulatory Visit
Admission: RE | Admit: 2022-06-16 | Discharge: 2022-06-16 | Disposition: A | Discharge status: Home / Self Care | Payer: Medicaid Other | Source: Ambulatory Visit | Attending: Family Medicine | Admitting: Family Medicine

## 2022-06-16 DIAGNOSIS — Z1231 Encounter for screening mammogram for malignant neoplasm of breast: Secondary | ICD-10-CM

## 2022-06-16 DIAGNOSIS — Z7689 Persons encountering health services in other specified circumstances: Secondary | ICD-10-CM | POA: Diagnosis not present

## 2022-06-16 DIAGNOSIS — Z419 Encounter for procedure for purposes other than remedying health state, unspecified: Secondary | ICD-10-CM | POA: Diagnosis not present

## 2022-06-19 ENCOUNTER — Other Ambulatory Visit: Payer: Self-pay | Admitting: Family Medicine

## 2022-06-19 DIAGNOSIS — R928 Other abnormal and inconclusive findings on diagnostic imaging of breast: Secondary | ICD-10-CM

## 2022-06-23 ENCOUNTER — Encounter: Payer: Self-pay | Admitting: Family Medicine

## 2022-06-23 ENCOUNTER — Ambulatory Visit: Payer: Medicaid Other | Attending: Family Medicine | Admitting: Family Medicine

## 2022-06-23 ENCOUNTER — Other Ambulatory Visit: Payer: Self-pay

## 2022-06-23 VITALS — BP 133/83 | HR 70 | Temp 98.0°F | Ht 61.0 in | Wt 194.6 lb

## 2022-06-23 DIAGNOSIS — K439 Ventral hernia without obstruction or gangrene: Secondary | ICD-10-CM

## 2022-06-23 DIAGNOSIS — K5909 Other constipation: Secondary | ICD-10-CM | POA: Diagnosis not present

## 2022-06-23 DIAGNOSIS — Z7689 Persons encountering health services in other specified circumstances: Secondary | ICD-10-CM | POA: Diagnosis not present

## 2022-06-23 NOTE — Patient Instructions (Signed)

## 2022-06-23 NOTE — Progress Notes (Signed)
Subjective:  Patient ID: Samantha Evans, female    DOB: 04/28/1961  Age: 61 y.o. MRN: [id number]  CC: Hospitalization Follow-up   HPI Samantha Evans is a 61 y.o. year old female with a history of hepatitis C, incarcerated umbilical hernia repair, hypertension. She presents today for blood pressure follow-up last amlodipine was commenced at last visit due to new diagnosis of hypertension.  Interval History: At her last visit blood pressure was 161/89 but is controlled today at 133/83.  She endorses adherence with her antihypertensive and is tolerating it well.  She has supraumbilical pain at the site of her hernia which at her last visit she had stated she was able to reduce on her own.  Pain is worse on waking up in the morning.  She had an ED visit for this 2 weeks ago due to pain and hernia was reduced. She has no nausea, vomiting but as been constipated since her ED visit and has been taking Metamucil. Past Medical History:  Diagnosis Date   Arthritis    Carpal tunnel syndrome    Cataracts, bilateral    Cirrhosis ([place])    Hypertension     Past Surgical History:  Procedure Laterality Date   CATARACT EXTRACTION Bilateral    ERCP     UMBILICAL HERNIA REPAIR N/A 02/27/2021   Procedure: REPAIR INCARCERATED UMBILICAL HERNIA;  Surgeon: Georganna Skeans, MD;  Location: [place];  Service: General;  Laterality: N/A;    Family History  Problem Relation Age of Onset   Other Mother        Hit by a train while in her truck   Lung cancer Father    Liver disease Neg Hx    Colon cancer Neg Hx    Esophageal cancer Neg Hx    Pancreatic cancer Neg Hx    Stomach cancer Neg Hx     Social History   Socioeconomic History   Marital status: Legally Separated    Spouse name: Not on file   Number of children: 4   Years of education: Not on file   Highest education level: Not on file  Occupational History   Occupation: unemployed  Tobacco Use   Smoking status: Every Day    Packs/day: 0.50     Years: 40.00    Total pack years: 20.00    Types: Cigarettes   Smokeless tobacco: Never   Tobacco comments:    Quit smoking on 02/27/2021 when hernia repair surgery occurred.  Back to smoking .5 pack of cigarettes daily  Vaping Use   Vaping Use: Never used  Substance and Sexual Activity   Alcohol use: Not Currently   Drug use: Not Currently   Sexual activity: Not on file  Other Topics Concern   Not on file  Social History Narrative   Not on file   Social Determinants of Health   Financial Resource Strain: Low Risk  (07/22/2021)   Overall Financial Resource Strain (CARDIA)    Difficulty of Paying Living Expenses: Not hard at all  Food Insecurity: No Food Insecurity (07/22/2021)   Hunger Vital Sign    Worried About Running Out of Food in the Last Year: Never true    [place] in the Last Year: Never true  Transportation Needs: Unmet Transportation Needs (09/02/2021)   PRAPARE - Transportation    Lack of Transportation (Medical): Yes    Lack of Transportation (Non-Medical): Yes  Physical Activity: Inactive (07/22/2021)   Exercise Vital Sign  Days of Exercise per Week: 0 days    Minutes of Exercise per Session: 0 min  Stress: Stress Concern Present (07/22/2021)   [place] Level    Feeling of Stress : To some extent  Social Connections: Socially Isolated (07/22/2021)   Social Connection and Isolation Panel [NHANES]    Frequency of Communication with Friends and Family: More than three times a week    Frequency of Social Gatherings with Friends and Family: More than three times a week    Attends Religious Services: Never    Marine scientist or Organizations: No    Attends Archivist Meetings: Never    Marital Status: Separated    No Known Allergies  Outpatient Medications Prior to Visit  Medication Sig Dispense Refill   amLODipine (NORVASC) 5 MG tablet Take 1 tablet (5 mg total) by mouth daily.  90 tablet 1   CALCIUM CITRATE PO Take 1 tablet by mouth See admin instructions. Take 1 tablet by mouth every 3-4 days.     ibuprofen (ADVIL) 200 MG tablet Take 400 mg by mouth daily as needed for headache.     Psyllium (METAMUCIL PO) Take 16 oz by mouth See admin instructions. Take 16 oz by mouth every 2 days.     spironolactone (ALDACTONE) 50 MG tablet Take 1 tablet (50 mg total) by mouth daily. 30 tablet 5   No facility-administered medications prior to visit.     ROS Review of Systems  Constitutional:  Negative for activity change, appetite change and fatigue.  HENT:  Negative for congestion, sinus pressure and sore throat.   Eyes:  Negative for visual disturbance.  Respiratory:  Negative for cough, chest tightness, shortness of breath and wheezing.   Cardiovascular:  Negative for chest pain and palpitations.  Gastrointestinal:  Positive for abdominal pain and constipation. Negative for abdominal distention.  Endocrine: Negative for polydipsia.  Genitourinary:  Negative for dysuria and frequency.  Musculoskeletal:  Negative for arthralgias and back pain.  Skin:  Negative for rash.  Neurological:  Negative for tremors, light-headedness and numbness.  Hematological:  Does not bruise/bleed easily.  Psychiatric/Behavioral:  Negative for agitation and behavioral problems.     Objective:  BP 133/83   Pulse 70   Temp 98 F (36.7 C) (Oral)   Ht '5\' 1"'$  (1.549 m)   Wt 194 lb 9.6 oz (88.3 kg)   SpO2 96%   BMI 36.77 kg/m      06/23/2022   10:55 AM 06/08/2022    3:45 AM 06/08/2022    3:30 AM  BP/Weight  Systolic BP 025 427 062  Diastolic BP 83 77 84  Wt. (Lbs) 194.6    BMI 36.77 kg/m2        Physical Exam Constitutional:      Appearance: She is well-developed.  Cardiovascular:     Rate and Rhythm: Normal rate.     Heart sounds: Normal heart sounds. No murmur heard. Pulmonary:     Effort: Pulmonary effort is normal.     Breath sounds: Normal breath sounds. No wheezing or  rales.  Chest:     Chest wall: No tenderness.  Abdominal:     General: Bowel sounds are normal. There is no distension.     Palpations: Abdomen is soft. There is no mass.     Tenderness: There is no abdominal tenderness.     Hernia: A hernia (ventral, reducible) is present.     Comments: Infraumbilical  healed surgical scar from previous hernia repair  Musculoskeletal:        General: Normal range of motion.     Right lower leg: No edema.     Left lower leg: No edema.  Neurological:     Mental Status: She is alert and oriented to person, place, and time.  Psychiatric:        Mood and Affect: Mood normal.        Latest Ref Rng & Units 06/07/2022    9:25 PM 05/26/2022   11:28 AM 03/13/2022    7:50 AM  CMP  Glucose 70 - 99 mg/dL 133  89  107   BUN 8 - 23 mg/dL '22  19  19   '$ Creatinine 0.44 - 1.00 mg/dL 0.90  0.90  0.87   Sodium 135 - 145 mmol/L 136  139  135   Potassium 3.5 - 5.1 mmol/L 4.3  4.1  4.0   Chloride 98 - 111 mmol/L 106  104  107   CO2 22 - 32 mmol/L '24  22  23   '$ Calcium 8.9 - 10.3 mg/dL 10.8  10.9  10.8   Total Protein 6.5 - 8.1 g/dL 7.2  7.3  7.0   Total Bilirubin 0.3 - 1.2 mg/dL 0.8  0.9  0.7   Alkaline Phos 38 - 126 U/L 106  124  108   AST 15 - 41 U/L 33  33  30   ALT 0 - 44 U/L '21  18  17     '$ Lipid Panel     Component Value Date/Time   CHOL 144 05/26/2022 1128   TRIG 98 05/26/2022 1128   HDL 40 05/26/2022 1128   LDLCALC 86 05/26/2022 1128    CBC    Component Value Date/Time   WBC 5.5 06/07/2022 2125   RBC 4.20 06/07/2022 2125   HGB 12.4 06/07/2022 2125   HGB 12.5 05/26/2022 1128   HCT 36.5 06/07/2022 2125   HCT 35.9 05/26/2022 1128   PLT 119 (L) 06/07/2022 2125   PLT 130 (L) 05/26/2022 1128   MCV 86.9 06/07/2022 2125   MCV 85 05/26/2022 1128   MCH 29.5 06/07/2022 2125   MCHC 34.0 06/07/2022 2125   RDW 14.9 06/07/2022 2125   RDW 14.5 05/26/2022 1128   LYMPHSABS 1.1 05/26/2022 1128   MONOABS 0.4 01/16/2022 1610   EOSABS 0.1 05/26/2022 1128    BASOSABS 0.0 05/26/2022 1128    Lab Results  Component Value Date   HGBA1C 4.8 05/26/2022    Assessment & Plan:  1. Ventral hernia without obstruction or gangrene With no obstruction or gangrene Hernia is reducible She continues to experience pain Advised to avoid being constipated - Ambulatory referral to General Surgery  2. Hypercalcemia Last calcium was 10.9 She endorses intake of calcium OTC We will check PTH and calcium today and she has been advised to discontinue calcium If still elevated will check again at next visit after she has been off her calcium supplements  3.  Constipation She does have MiraLAX at home and has been advised to take this Counseled on increasing fiber intake, fruits and vegetable, limit intake of foods like cheese, white bread, white rice     No orders of the defined types were placed in this encounter.   Follow-up: Return in about 3 months (around 09/23/2022) for Chronic medical conditions; cancel previously existing appointment.       [name], MD, FAAFP. [place]  [place], [place]   06/23/2022, 11:26 AM

## 2022-06-23 NOTE — Progress Notes (Signed)
Hernia discomfort above navel.

## 2022-06-24 ENCOUNTER — Other Ambulatory Visit: Payer: Self-pay | Admitting: Family Medicine

## 2022-06-24 LAB — PTH, INTACT AND CALCIUM
Calcium: 11.1 mg/dL — ABNORMAL HIGH (ref 8.7–10.3)
PTH: 37 pg/mL (ref 15–65)

## 2022-07-02 ENCOUNTER — Ambulatory Visit
Admission: RE | Admit: 2022-07-02 | Discharge: 2022-07-02 | Disposition: A | Discharge status: Home / Self Care | Payer: Medicaid Other | Source: Ambulatory Visit | Attending: Family Medicine | Admitting: Family Medicine

## 2022-07-02 ENCOUNTER — Other Ambulatory Visit: Payer: Self-pay | Admitting: Family Medicine

## 2022-07-02 DIAGNOSIS — R928 Other abnormal and inconclusive findings on diagnostic imaging of breast: Secondary | ICD-10-CM | POA: Diagnosis not present

## 2022-07-02 DIAGNOSIS — N6489 Other specified disorders of breast: Secondary | ICD-10-CM | POA: Diagnosis not present

## 2022-07-02 DIAGNOSIS — Z7689 Persons encountering health services in other specified circumstances: Secondary | ICD-10-CM | POA: Diagnosis not present

## 2022-07-02 DIAGNOSIS — N6310 Unspecified lump in the right breast, unspecified quadrant: Secondary | ICD-10-CM

## 2022-07-07 ENCOUNTER — Ambulatory Visit: Payer: Medicaid Other | Admitting: Surgery

## 2022-07-07 DIAGNOSIS — Z7689 Persons encountering health services in other specified circumstances: Secondary | ICD-10-CM | POA: Diagnosis not present

## 2022-07-08 ENCOUNTER — Ambulatory Visit: Payer: Medicaid Other | Admitting: Family Medicine

## 2022-07-09 ENCOUNTER — Encounter: Payer: Self-pay | Admitting: Orthopaedic Surgery

## 2022-07-09 ENCOUNTER — Ambulatory Visit (INDEPENDENT_AMBULATORY_CARE_PROVIDER_SITE_OTHER): Payer: Medicaid Other | Admitting: Orthopaedic Surgery

## 2022-07-09 DIAGNOSIS — M25561 Pain in right knee: Secondary | ICD-10-CM

## 2022-07-09 DIAGNOSIS — M1711 Unilateral primary osteoarthritis, right knee: Secondary | ICD-10-CM | POA: Diagnosis not present

## 2022-07-09 DIAGNOSIS — G8929 Other chronic pain: Secondary | ICD-10-CM | POA: Diagnosis not present

## 2022-07-09 DIAGNOSIS — Z7689 Persons encountering health services in other specified circumstances: Secondary | ICD-10-CM | POA: Diagnosis not present

## 2022-07-09 NOTE — Progress Notes (Signed)
Office Visit Note   Patient: Samantha Evans           Date of Birth: 1961-08-19           MRN: [id number] Visit Date: 07/09/2022              Requested by: [name], MD [address] [place],  [place] [address] PCP: [name], MD   Assessment & Plan: Visit Diagnoses:  1. Chronic pain of right knee   2. Unilateral primary osteoarthritis, right knee     Plan: Pleasant 61 year old woman returns for right knee pain.  She did receive a cortisone injection and reports not even diagnostic relief.  She does have a remote history of a proximal fibula fracture 10 years ago.  Pain started in her knee about 4 years ago.  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  She does have some varus alignment however her pain is more focused over the lateral joint line.  She still has some swelling.  As she has not had resolution of her pain with an injection continues to have more pain we recommend an MRI  Follow-Up Instructions: Return after MRI.   Orders:  Orders Placed This Encounter  Procedures   MR Knee Right w/o contrast   No orders of the defined types were placed in this encounter.     Procedures: No procedures performed   Clinical Data: No additional findings.   Subjective: Chief Complaint  Patient presents with   Right Knee - Follow-up   Patient presents today for follow up of her right knee pain. She states that she has been having constant aching pains which are becoming gradually worse. She states that she was given a injection on 06/04/2022 have this gave no relief of pain. She is currently taking ibuprofen however this was not helping.   Review of Systems  All other systems reviewed and are negative.    Objective: Vital Signs: There were no vitals taken for this visit.  Physical Exam Vitals reviewed.  HENT:     Head: Normocephalic.  Pulmonary:     Effort: Pulmonary effort is normal.  Skin:    General: Skin is warm and dry.  Neurological:     Mental  Status: She is alert.     Ortho Exam Examination of her right knee: She does have some soft tissue swelling but no erythema, no cellulitis no effusion.  She has full extension.  No significant pain medially but does have some pain over the lateral joint line.  Slight varus malalignment.  Knee was not hot warm or red.  No instability.  No popliteal pain or calf discomfort.  Painless range of motion both hips Specialty Comments:  No specialty comments available.  Imaging: No results found.   PMFS History: Patient Active Problem List   Diagnosis Date Noted   Unilateral primary osteoarthritis, right knee 06/04/2022   Peripheral edema 05/12/2021   Chronic hepatitis C without hepatic coma ([place]) 03/25/2021   Acid reflux 03/25/2021   Incarcerated umbilical hernia 97/67/3419   Small bowel obstruction ([place]) 02/27/2021   Hepatic cirrhosis ([place])- by CT scan 02/27/2021   AKI (acute kidney injury) ([place]) 02/27/2021   Past Medical History:  Diagnosis Date   Arthritis    Carpal tunnel syndrome    Cataracts, bilateral    Cirrhosis ([place])    Hypertension     Family History  Problem Relation Age of Onset   Other Mother  Hit by a train while in her truck   Lung cancer Father    Liver disease Neg Hx    Colon cancer Neg Hx    Esophageal cancer Neg Hx    Pancreatic cancer Neg Hx    Stomach cancer Neg Hx     Past Surgical History:  Procedure Laterality Date   CATARACT EXTRACTION Bilateral    ERCP     UMBILICAL HERNIA REPAIR N/A 02/27/2021   Procedure: REPAIR INCARCERATED UMBILICAL HERNIA;  Surgeon: Georganna Skeans, MD;  Location: [place];  Service: General;  Laterality: N/A;   Social History   Occupational History   Occupation: unemployed  Tobacco Use   Smoking status: Every Day    Packs/day: 0.50    Years: 40.00    Total pack years: 20.00    Types: Cigarettes   Smokeless tobacco: Never   Tobacco comments:    Quit smoking on 02/27/2021 when hernia repair surgery occurred.   Back to smoking .5 pack of cigarettes daily  Vaping Use   Vaping Use: Never used  Substance and Sexual Activity   Alcohol use: Not Currently   Drug use: Not Currently   Sexual activity: Not on file

## 2022-07-10 ENCOUNTER — Ambulatory Visit
Admission: RE | Admit: 2022-07-10 | Discharge: 2022-07-10 | Disposition: A | Discharge status: Home / Self Care | Payer: Medicaid Other | Source: Ambulatory Visit | Attending: Family Medicine | Admitting: Family Medicine

## 2022-07-10 DIAGNOSIS — N6310 Unspecified lump in the right breast, unspecified quadrant: Secondary | ICD-10-CM

## 2022-07-10 DIAGNOSIS — N6311 Unspecified lump in the right breast, upper outer quadrant: Secondary | ICD-10-CM | POA: Diagnosis not present

## 2022-07-16 ENCOUNTER — Encounter: Payer: Self-pay | Admitting: Surgery

## 2022-07-16 ENCOUNTER — Ambulatory Visit (INDEPENDENT_AMBULATORY_CARE_PROVIDER_SITE_OTHER): Payer: Medicaid Other | Admitting: Surgery

## 2022-07-16 DIAGNOSIS — K432 Incisional hernia without obstruction or gangrene: Secondary | ICD-10-CM

## 2022-07-16 DIAGNOSIS — Z7689 Persons encountering health services in other specified circumstances: Secondary | ICD-10-CM | POA: Diagnosis not present

## 2022-07-16 DIAGNOSIS — D241 Benign neoplasm of right breast: Secondary | ICD-10-CM

## 2022-07-16 NOTE — H&P (View-Only) (Signed)
Patient ID: Samantha Evans, female   DOB: Feb 18, 1961, 61 y.o.   MRN: [id number]  Chief Complaint: Presents today with bulging periumbilical hernia.  However has recent core biopsy requiring additional excision from the right breast.  History of Present Illness Samantha Evans is a 61 y.o. female with the above.  Last CT scan was July 2023 confirming the umbilical periumbilical hernia defect.  Prior hernia repair in April 2022.  Reportedly no mesh was utilized at that time as she presented with bowel obstruction.  Currently complains of discomfort during the day but otherwise only presents with a bulge. Of greater importance today is a recent core biopsy obtained from the right breast lesion showing an atypical papillary lesion.  This was picked up on screening mammography.  She had no history of nipple discharge, palpable breast mass or other breast change.  Past Medical History Past Medical History:  Diagnosis Date   Arthritis    Carpal tunnel syndrome    Cataracts, bilateral    Cirrhosis ([place])    Hypertension       Past Surgical History:  Procedure Laterality Date   CATARACT EXTRACTION Bilateral    CHOLECYSTECTOMY     ERCP     UMBILICAL HERNIA REPAIR N/A 02/27/2021   Procedure: REPAIR INCARCERATED UMBILICAL HERNIA;  Surgeon: Georganna Skeans, MD;  Location: [place];  Service: General;  Laterality: N/A;    No Known Allergies  Current Outpatient Medications  Medication Sig Dispense Refill   amLODipine (NORVASC) 5 MG tablet Take 1 tablet (5 mg total) by mouth daily. 90 tablet 1   CALCIUM CITRATE PO Take 1 tablet by mouth See admin instructions. Take 1 tablet by mouth every 3-4 days.     ibuprofen (ADVIL) 200 MG tablet Take 400 mg by mouth daily as needed for headache.     Psyllium (METAMUCIL PO) Take 16 oz by mouth See admin instructions. Take 16 oz by mouth every 2 days.     spironolactone (ALDACTONE) 50 MG tablet Take 1 tablet (50 mg total) by mouth daily. 30 tablet 5   No current  facility-administered medications for this visit.    Family History Family History  Problem Relation Age of Onset   Other Mother        Hit by a train while in her truck   Lung cancer Father    Liver disease Neg Hx    Colon cancer Neg Hx    Esophageal cancer Neg Hx    Pancreatic cancer Neg Hx    Stomach cancer Neg Hx       Social History Social History   Tobacco Use   Smoking status: Every Day    Packs/day: 0.50    Years: 40.00    Total pack years: 20.00    Types: Cigarettes    Passive exposure: Past   Smokeless tobacco: Never   Tobacco comments:    Quit smoking on 02/27/2021 when hernia repair surgery occurred.  Back to smoking .5 pack of cigarettes daily  Vaping Use   Vaping Use: Never used  Substance Use Topics   Alcohol use: Not Currently   Drug use: Not Currently        Review of Systems  Constitutional: Negative.   HENT: Negative.    Eyes: Negative.   Respiratory: Negative.    Cardiovascular:  Positive for leg swelling.  Gastrointestinal:  Positive for abdominal pain.  Genitourinary:  Positive for frequency.  Skin: Negative.   Neurological:  Positive for headaches.  Psychiatric/Behavioral: Negative.    All other systems reviewed and are negative.     Physical Exam There were no vitals taken for this visit.   CONSTITUTIONAL: Well developed, and nourished, appropriately responsive and aware without distress.   EYES: Sclera non-icteric.   EARS, NOSE, MOUTH AND THROAT:  The oropharynx is clear. Oral mucosa is pink and moist.   Hearing is intact to voice.  NECK: Trachea is midline, and there is no jugular venous distension.  LYMPH NODES:  Lymph nodes in the neck are not enlarged. RESPIRATORY:  Lungs are clear, and breath sounds are equal bilaterally. Normal respiratory effort without pathologic use of accessory muscles. CARDIOVASCULAR: Heart is regular in rate and rhythm. GI: The abdomen is soft, nontender, and nondistended.  There is a reducible  midline periumbilical hernia, difficult to fully appreciate fascial defects present.  There were no palpable masses. I did not appreciate hepatosplenomegaly. There were normal bowel sounds.  No succussion splash of ascites.  No obvious stigmata of cirrhosis/portal hypertension.  MUSCULOSKELETAL:  Symmetrical muscle tone appreciated in all four extremities.    SKIN: Skin turgor is normal. No pathologic skin lesions appreciated.  NEUROLOGIC:  Motor and sensation appear grossly normal.  Cranial nerves are grossly without defect. PSYCH:  Alert and oriented to person, place and time. Affect is appropriate for situation.  Data Reviewed I have personally reviewed what is currently available of the patient's imaging, recent labs and medical records.   Labs:     Latest Ref Rng & Units 06/07/2022    9:25 PM 05/26/2022   11:28 AM 03/13/2022    7:50 AM  CBC  WBC 4.0 - 10.5 K/uL 5.5  4.3  5.0   Hemoglobin 12.0 - 15.0 g/dL 12.4  12.5  11.8   Hematocrit 36.0 - 46.0 % 36.5  35.9  36.0   Platelets 150 - 400 K/uL 119  130  120       Latest Ref Rng & Units 06/23/2022   11:32 AM 06/07/2022    9:25 PM 05/26/2022   11:28 AM  CMP  Glucose 70 - 99 mg/dL  133  89   BUN 8 - 23 mg/dL  22  19   Creatinine 0.44 - 1.00 mg/dL  0.90  0.90   Sodium 135 - 145 mmol/L  136  139   Potassium 3.5 - 5.1 mmol/L  4.3  4.1   Chloride 98 - 111 mmol/L  106  104   CO2 22 - 32 mmol/L  24  22   Calcium 8.7 - 10.3 mg/dL 11.1  10.8  10.9   Total Protein 6.5 - 8.1 g/dL  7.2  7.3   Total Bilirubin 0.3 - 1.2 mg/dL  0.8  0.9   Alkaline Phos 38 - 126 U/L  106  124   AST 15 - 41 U/L  33  33   ALT 0 - 44 U/L  21  18    Surgical Pathology: Diagnosis Breast, right, needle core biopsy, 9:00 3 cmfn - ATYPICAL PAPILLARY LESION, RECOMMEND EXCISION (SEE DIAGNOSIS NOTE). - MICROCALCIFICATIONS PRESENT.   Imaging: CLINICAL DATA:  Patient returns from baseline screening mammogram for evaluation of possible RIGHT breast asymmetry.    EXAM: DIGITAL DIAGNOSTIC UNILATERAL RIGHT MAMMOGRAM WITH TOMOSYNTHESIS; ULTRASOUND RIGHT BREAST LIMITED   TECHNIQUE: Right digital diagnostic mammography and breast tomosynthesis was performed.; Targeted ultrasound examination of the right breast was performed   COMPARISON:  06/16/2022   ACR Breast Density Category b: There are scattered areas of  fibroglandular density.   FINDINGS: Additional 2-D and 3-D images are performed. These views show no persistent mass or asymmetry in the LATERAL aspect of the RIGHT breast.   Targeted ultrasound is performed, showing no definitive sonographic correlate for the screening abnormality. An irregular mass with irregular margins and posterior acoustic enhancement is present in the 9 o'clock location of the RIGHT breast 3 centimeters from the nipple, likely an incidental finding. There is no associated internal blood flow.   Evaluation of the RIGHT axilla is negative for adenopathy.   IMPRESSION: 1. No persistent asymmetry in the RIGHT breast. 2. Likely incidental indeterminate mass in the 9 o'clock location of the RIGHT breast for which biopsy is recommended.   RECOMMENDATION: Ultrasound-guided core biopsy of RIGHT breast mass.   I have discussed the findings and recommendations with the patient. If applicable, a reminder letter will be sent to the patient regarding the next appointment.   BI-RADS CATEGORY  4: Suspicious.     Electronically Signed   By: Nolon Nations M.D.   On: 07/02/2022 16:12 Within last 24 hrs: No results found.  Assessment     Patient Active Problem List   Diagnosis Date Noted   Papilloma of right breast 07/16/2022   Ventral hernia, recurrent 07/16/2022   Unilateral primary osteoarthritis, right knee 06/04/2022   Peripheral edema 05/12/2021   Chronic hepatitis C without hepatic coma ([place]) 03/25/2021   Acid reflux 03/25/2021   Incarcerated umbilical hernia 44/01/4741   Small bowel obstruction  ([place]) 02/27/2021   Hepatic cirrhosis ([place])- by CT scan 02/27/2021   AKI (acute kidney injury) ([place]) 02/27/2021    Plan    RFID tag localized right breast excisional biopsy. Will defer pursuit of recurrent ventral hernia until the above. Patient needs follow-up imaging regarding hepatic lesions and pancreatic lesions and plan for MRI.  Risks reviewed and accepted.  All questions answered.  Face-to-face time spent with the patient and accompanying care providers(if present) was 45 minutes, with more than 50% of the time spent counseling, educating, and coordinating care of the patient.    These notes generated with voice recognition software. I apologize for typographical errors.  Ronny Bacon M.D., FACS 07/22/2022, 10:39 AM

## 2022-07-16 NOTE — Patient Instructions (Addendum)
We have spoken today about removing a lump in your breast. This will be done by Dr. Christian Mate at [facility].  You will get a Tag placed prior to surgery in your breast. This is called an RFID tag.  What is radio frequency localization of the breast?(RFID) RFID tag localization uses radiofrequency technology to accurately pinpoint the tumor. Seeing exactly where the tumor is before surgery helps surgeons more effectively remove the entire tumor and spare surrounding healthy breast tissue.   You will most likely be able to leave the hospital several hours after your surgery.   Plan to tenatively be off work for 1-2 weeks following the surgery and may return with approximately 4 more weeks of a lifting restriction, no greater than 15 lbs.  Please see your Blue surgery sheet for more information. Our surgery scheduler will call you to look at surgery dates and to go over information.     Lumpectomy/biopsy A lumpectomy is a form of "breast conserving" or "breast preservation" surgery. It may also be referred to as a partial mastectomy. During a lumpectomy, the portion of the breast that contains the cancerous tumor or breast mass (the lump) is removed. Some normal tissue around the lump may also be removed to make sure all of the tumor has been removed.  LET [facility] CARE PROVIDER KNOW ABOUT: Any allergies you have. All medicines you are taking, including vitamins, herbs, eye drops, creams, and over-the-counter medicines. Previous problems you or members of your family have had with the use of anesthetics. Any blood disorders you have. Previous surgeries you have had. Medical conditions you have. RISKS AND COMPLICATIONS Generally, this is a safe procedure. However, problems can occur and include: Bleeding. Infection. Pain. Temporary swelling. Change in the shape of the breast, particularly if a large portion is removed. BEFORE THE PROCEDURE Ask your health care provider about changing or  stopping your regular medicines. This is especially important if you are taking diabetes medicines or blood thinners. Do not eat or drink anything after midnight on the night before the procedure or as directed by your health care provider. Ask your health care provider if you can take a sip of water with any approved medicines. On the day of surgery, your health care provider will use a mammogram or ultrasound to locate and mark the tumor in your breast. These markings on your breast will show where the cut (incision) will be made. PROCEDURE  An IV tube will be put into one of your veins. You may be given medicine to help you relax before the surgery (sedative). You will be given one of the following: A medicine that numbs the area (local anesthetic). A medicine that makes you fall asleep (general anesthetic). Your health care provider will use a kind of electric scalpel that uses heat to minimize bleeding (electrocautery knife). A curved incision (like a smile or frown) that follows the natural curve of your breast is made, to allow for minimal scarring and better healing. The tumor will be removed with some of the surrounding tissue. This will be sent to the lab for analysis. Your health care provider may also remove your lymph nodes at this time if needed. Sometimes, but not always, a rubber tube called a drain will be surgically inserted into your breast area or armpit to collect excess fluid that may accumulate in the space where the tumor was. This drain is connected to a plastic bulb on the outside of your body. This drain creates suction  to help remove the fluid. The incisions will be closed with stitches (sutures). A bandage may be placed over the incisions. AFTER THE PROCEDURE You will be taken to the recovery area. You will be given medicine for pain. A small rubber drain may be placed in the breast for 2-3 days to prevent a collection of blood (hematoma) from developing in the breast.  You will be given instructions on caring for the drain before you go home. A pressure bandage (dressing) will be applied for 1-2 days to prevent bleeding. Ask your health care provider how to care for your bandage at home.   This information is not intended to replace advice given to you by your health care provider. Make sure you discuss any questions you have with your health care provider.   Document Released: 12/14/2006 Document Revised: 11/23/2014 Document Reviewed: 04/07/2013 Elsevier Interactive Patient Education [employer].

## 2022-07-16 NOTE — Progress Notes (Signed)
Patient ID: Samantha Evans, female   DOB: 18-Jul-1961, 61 y.o.   MRN: [id number]  Chief Complaint: Presents today with bulging periumbilical hernia.  However has recent core biopsy requiring additional excision from the right breast.  History of Present Illness Samantha Evans is a 61 y.o. female with the above.  Last CT scan was July 2023 confirming the umbilical periumbilical hernia defect.  Prior hernia repair in April 2022.  Reportedly no mesh was utilized at that time as she presented with bowel obstruction.  Currently complains of discomfort during the day but otherwise only presents with a bulge. Of greater importance today is a recent core biopsy obtained from the right breast lesion showing an atypical papillary lesion.  This was picked up on screening mammography.  She had no history of nipple discharge, palpable breast mass or other breast change.  Past Medical History Past Medical History:  Diagnosis Date   Arthritis    Carpal tunnel syndrome    Cataracts, bilateral    Cirrhosis ([place])    Hypertension       Past Surgical History:  Procedure Laterality Date   CATARACT EXTRACTION Bilateral    CHOLECYSTECTOMY     ERCP     UMBILICAL HERNIA REPAIR N/A 02/27/2021   Procedure: REPAIR INCARCERATED UMBILICAL HERNIA;  Surgeon: Georganna Skeans, MD;  Location: [place];  Service: General;  Laterality: N/A;    No Known Allergies  Current Outpatient Medications  Medication Sig Dispense Refill   amLODipine (NORVASC) 5 MG tablet Take 1 tablet (5 mg total) by mouth daily. 90 tablet 1   CALCIUM CITRATE PO Take 1 tablet by mouth See admin instructions. Take 1 tablet by mouth every 3-4 days.     ibuprofen (ADVIL) 200 MG tablet Take 400 mg by mouth daily as needed for headache.     Psyllium (METAMUCIL PO) Take 16 oz by mouth See admin instructions. Take 16 oz by mouth every 2 days.     spironolactone (ALDACTONE) 50 MG tablet Take 1 tablet (50 mg total) by mouth daily. 30 tablet 5   No current  facility-administered medications for this visit.    Family History Family History  Problem Relation Age of Onset   Other Mother        Hit by a train while in her truck   Lung cancer Father    Liver disease Neg Hx    Colon cancer Neg Hx    Esophageal cancer Neg Hx    Pancreatic cancer Neg Hx    Stomach cancer Neg Hx       Social History Social History   Tobacco Use   Smoking status: Every Day    Packs/day: 0.50    Years: 40.00    Total pack years: 20.00    Types: Cigarettes    Passive exposure: Past   Smokeless tobacco: Never   Tobacco comments:    Quit smoking on 02/27/2021 when hernia repair surgery occurred.  Back to smoking .5 pack of cigarettes daily  Vaping Use   Vaping Use: Never used  Substance Use Topics   Alcohol use: Not Currently   Drug use: Not Currently        Review of Systems  Constitutional: Negative.   HENT: Negative.    Eyes: Negative.   Respiratory: Negative.    Cardiovascular:  Positive for leg swelling.  Gastrointestinal:  Positive for abdominal pain.  Genitourinary:  Positive for frequency.  Skin: Negative.   Neurological:  Positive for headaches.  Psychiatric/Behavioral: Negative.    All other systems reviewed and are negative.     Physical Exam There were no vitals taken for this visit.   CONSTITUTIONAL: Well developed, and nourished, appropriately responsive and aware without distress.   EYES: Sclera non-icteric.   EARS, NOSE, MOUTH AND THROAT:  The oropharynx is clear. Oral mucosa is pink and moist.   Hearing is intact to voice.  NECK: Trachea is midline, and there is no jugular venous distension.  LYMPH NODES:  Lymph nodes in the neck are not enlarged. RESPIRATORY:  Lungs are clear, and breath sounds are equal bilaterally. Normal respiratory effort without pathologic use of accessory muscles. CARDIOVASCULAR: Heart is regular in rate and rhythm. GI: The abdomen is soft, nontender, and nondistended.  There is a reducible  midline periumbilical hernia, difficult to fully appreciate fascial defects present.  There were no palpable masses. I did not appreciate hepatosplenomegaly. There were normal bowel sounds.  No succussion splash of ascites.  No obvious stigmata of cirrhosis/portal hypertension.  MUSCULOSKELETAL:  Symmetrical muscle tone appreciated in all four extremities.    SKIN: Skin turgor is normal. No pathologic skin lesions appreciated.  NEUROLOGIC:  Motor and sensation appear grossly normal.  Cranial nerves are grossly without defect. PSYCH:  Alert and oriented to person, place and time. Affect is appropriate for situation.  Data Reviewed I have personally reviewed what is currently available of the patient's imaging, recent labs and medical records.   Labs:     Latest Ref Rng & Units 06/07/2022    9:25 PM 05/26/2022   11:28 AM 03/13/2022    7:50 AM  CBC  WBC 4.0 - 10.5 K/uL 5.5  4.3  5.0   Hemoglobin 12.0 - 15.0 g/dL 12.4  12.5  11.8   Hematocrit 36.0 - 46.0 % 36.5  35.9  36.0   Platelets 150 - 400 K/uL 119  130  120       Latest Ref Rng & Units 06/23/2022   11:32 AM 06/07/2022    9:25 PM 05/26/2022   11:28 AM  CMP  Glucose 70 - 99 mg/dL  133  89   BUN 8 - 23 mg/dL  22  19   Creatinine 0.44 - 1.00 mg/dL  0.90  0.90   Sodium 135 - 145 mmol/L  136  139   Potassium 3.5 - 5.1 mmol/L  4.3  4.1   Chloride 98 - 111 mmol/L  106  104   CO2 22 - 32 mmol/L  24  22   Calcium 8.7 - 10.3 mg/dL 11.1  10.8  10.9   Total Protein 6.5 - 8.1 g/dL  7.2  7.3   Total Bilirubin 0.3 - 1.2 mg/dL  0.8  0.9   Alkaline Phos 38 - 126 U/L  106  124   AST 15 - 41 U/L  33  33   ALT 0 - 44 U/L  21  18    Surgical Pathology: Diagnosis Breast, right, needle core biopsy, 9:00 3 cmfn - ATYPICAL PAPILLARY LESION, RECOMMEND EXCISION (SEE DIAGNOSIS NOTE). - MICROCALCIFICATIONS PRESENT.   Imaging: CLINICAL DATA:  Patient returns from baseline screening mammogram for evaluation of possible RIGHT breast asymmetry.    EXAM: DIGITAL DIAGNOSTIC UNILATERAL RIGHT MAMMOGRAM WITH TOMOSYNTHESIS; ULTRASOUND RIGHT BREAST LIMITED   TECHNIQUE: Right digital diagnostic mammography and breast tomosynthesis was performed.; Targeted ultrasound examination of the right breast was performed   COMPARISON:  06/16/2022   ACR Breast Density Category b: There are scattered areas of  fibroglandular density.   FINDINGS: Additional 2-D and 3-D images are performed. These views show no persistent mass or asymmetry in the LATERAL aspect of the RIGHT breast.   Targeted ultrasound is performed, showing no definitive sonographic correlate for the screening abnormality. An irregular mass with irregular margins and posterior acoustic enhancement is present in the 9 o'clock location of the RIGHT breast 3 centimeters from the nipple, likely an incidental finding. There is no associated internal blood flow.   Evaluation of the RIGHT axilla is negative for adenopathy.   IMPRESSION: 1. No persistent asymmetry in the RIGHT breast. 2. Likely incidental indeterminate mass in the 9 o'clock location of the RIGHT breast for which biopsy is recommended.   RECOMMENDATION: Ultrasound-guided core biopsy of RIGHT breast mass.   I have discussed the findings and recommendations with the patient. If applicable, a reminder letter will be sent to the patient regarding the next appointment.   BI-RADS CATEGORY  4: Suspicious.     Electronically Signed   By: Nolon Nations M.D.   On: 07/02/2022 16:12 Within last 24 hrs: No results found.  Assessment     Patient Active Problem List   Diagnosis Date Noted   Papilloma of right breast 07/16/2022   Ventral hernia, recurrent 07/16/2022   Unilateral primary osteoarthritis, right knee 06/04/2022   Peripheral edema 05/12/2021   Chronic hepatitis C without hepatic coma ([place]) 03/25/2021   Acid reflux 03/25/2021   Incarcerated umbilical hernia 33/35/4562   Small bowel obstruction  ([place]) 02/27/2021   Hepatic cirrhosis ([place])- by CT scan 02/27/2021   AKI (acute kidney injury) ([place]) 02/27/2021    Plan    RFID tag localized right breast excisional biopsy. Will defer pursuit of recurrent ventral hernia until the above. Patient needs follow-up imaging regarding hepatic lesions and pancreatic lesions and plan for MRI.  Risks reviewed and accepted.  All questions answered.  Face-to-face time spent with the patient and accompanying care providers(if present) was 45 minutes, with more than 50% of the time spent counseling, educating, and coordinating care of the patient.    These notes generated with voice recognition software. I apologize for typographical errors.  Ronny Bacon M.D., FACS 07/22/2022, 10:39 AM

## 2022-07-17 DIAGNOSIS — Z419 Encounter for procedure for purposes other than remedying health state, unspecified: Secondary | ICD-10-CM | POA: Diagnosis not present

## 2022-07-21 ENCOUNTER — Other Ambulatory Visit: Payer: Self-pay | Admitting: Surgery

## 2022-07-21 DIAGNOSIS — R928 Other abnormal and inconclusive findings on diagnostic imaging of breast: Secondary | ICD-10-CM

## 2022-07-22 ENCOUNTER — Ambulatory Visit: Payer: Self-pay | Admitting: Surgery

## 2022-07-22 DIAGNOSIS — D241 Benign neoplasm of right breast: Secondary | ICD-10-CM

## 2022-07-23 ENCOUNTER — Telehealth: Payer: Self-pay | Admitting: Surgery

## 2022-07-23 ENCOUNTER — Other Ambulatory Visit: Payer: Medicaid Other

## 2022-07-23 NOTE — Telephone Encounter (Signed)
Outgoing call, unable to leave message, voice mailbox full.  Please inform patient of the following regarding scheduled surgery with Dr. Christian Mate.    Pre-Admission date/time, and Surgery date at [facility].  Surgery Date: 08/12/22 Preadmission Testing Date: 08/04/22 (phone 1p-5p)  Also patient will need to call at [phone], between 1-3:00pm the day before surgery, to find out what time to arrive for surgery.    Patient also scheduled at [place] for the [facility] wire free loc. 07/28/22 arriving @ 3:30.

## 2022-07-23 NOTE — Telephone Encounter (Signed)
Patient called back and was given surgery information. °

## 2022-07-28 ENCOUNTER — Inpatient Hospital Stay: Admission: RE | Admit: 2022-07-28 | Payer: Medicaid Other | Source: Ambulatory Visit

## 2022-07-30 ENCOUNTER — Ambulatory Visit
Admission: RE | Admit: 2022-07-30 | Discharge: 2022-07-30 | Disposition: A | Discharge status: Home / Self Care | Payer: Medicaid Other | Source: Ambulatory Visit | Attending: Surgery | Admitting: Surgery

## 2022-07-30 DIAGNOSIS — R928 Other abnormal and inconclusive findings on diagnostic imaging of breast: Secondary | ICD-10-CM | POA: Insufficient documentation

## 2022-07-30 DIAGNOSIS — Z7689 Persons encountering health services in other specified circumstances: Secondary | ICD-10-CM | POA: Diagnosis not present

## 2022-08-04 ENCOUNTER — Other Ambulatory Visit: Payer: Self-pay

## 2022-08-04 ENCOUNTER — Encounter
Admission: RE | Admit: 2022-08-04 | Discharge: 2022-08-04 | Disposition: A | Discharge status: Home / Self Care | Payer: Medicaid Other | Source: Ambulatory Visit | Attending: Surgery | Admitting: Surgery

## 2022-08-04 HISTORY — DX: Unspecified viral hepatitis C without hepatic coma: B19.20

## 2022-08-04 NOTE — Patient Instructions (Addendum)
Your procedure is scheduled on: August 12, 2022 [facility] Report to the Registration Desk on the 1st floor of the [facility]. To find out your arrival time, please call [phone] between [place] on: August 11, 2022 If your arrival time is 6:00 am, do not arrive prior to that time as the [place] entrance doors do not open until 6:00 am.  REMEMBER: Instructions that are not followed completely may result in serious medical risk, up to and including death; or upon the discretion of your surgeon and anesthesiologist your surgery may need to be rescheduled.  DO NOT EAT OR DRINK ANYTHING after midnight the night before surgery.  No gum chewing, lozengers or hard candies.  TAKE THESE MEDICATIONS THE MORNING OF SURGERY WITH A SIP OF WATER: AMLODIPINE  One week prior to surgery: Stop Anti-inflammatories (NSAIDS) such as Advil, Aleve, Ibuprofen, Motrin, Naproxen, Naprosyn and ASPIRIN OR Aspirin based products such as Excedrin, Goodys Powder, BC Powder. Stop ANY OVER THE COUNTER supplements until after surgery. You may however, continue to take Tylenol if needed for pain up until the day of surgery.  No Alcohol for 24 hours before or after surgery.  No Smoking including e-cigarettes for 24 hours prior to surgery.  No chewable tobacco products for at least 6 hours prior to surgery.  No nicotine patches on the day of surgery.  Do not use any "recreational" drugs for at least a week prior to your surgery.  Please be advised that the combination of cocaine and anesthesia may have negative outcomes, up to and including death. If you test positive for cocaine, your surgery will be cancelled.  On the morning of surgery brush your teeth with toothpaste and water, you may rinse your mouth with mouthwash if you wish. Do not swallow any toothpaste or mouthwash.  Shower morning of surgery.  Do not wear jewelry, make-up, hairpins, clips or nail polish.  Do not wear lotions, powders,  or perfumes OR DEODORANT   Do not shave body from the neck down 48 hours prior to surgery just in case you cut yourself which could leave a site for infection.  Also, freshly shaved skin may become irritated if using the CHG soap.  Contact lenses, hearing aids and dentures may not be worn into surgery.  Do not bring valuables to the hospital. [facility] is not responsible for any missing/lost belongings or valuables.   Notify your doctor if there is any change in your medical condition (cold, fever, infection).  Wear comfortable clothing (specific to your surgery type) to the hospital.  After surgery, you can help prevent lung complications by doing breathing exercises.  Take deep breaths and cough every 1-2 hours. Your doctor may order a device called an Incentive Spirometer to help you take deep breaths. When coughing or sneezing, hold a pillow firmly against your incision with both hands. This is called "splinting." Doing this helps protect your incision. It also decreases belly discomfort.  If you are being discharged the day of surgery, you will not be allowed to drive home. You will need a responsible adult (18 years or older) to drive you home and stay with you that night.   If you are taking public transportation, you will need to have a responsible adult (18 years or older) with you. Please confirm with your physician that it is acceptable to use public transportation.   Please call the [place] [facility]. at [phone] if you have any questions about these instructions.  Surgery Visitation Policy:  Patients undergoing a surgery or procedure may have two family members or support persons with them as long as the person is not COVID-19 positive or experiencing its symptoms.

## 2022-08-05 ENCOUNTER — Other Ambulatory Visit: Payer: Self-pay | Admitting: Orthopaedic Surgery

## 2022-08-05 ENCOUNTER — Encounter: Payer: Self-pay | Admitting: Family Medicine

## 2022-08-05 DIAGNOSIS — G8929 Other chronic pain: Secondary | ICD-10-CM

## 2022-08-11 MED ORDER — CEFAZOLIN SODIUM-DEXTROSE 2-4 GM/100ML-% IV SOLN
2.0000 g | INTRAVENOUS | Status: AC
  Administered 2022-08-12: 2 g via INTRAVENOUS

## 2022-08-11 MED ORDER — GABAPENTIN 300 MG PO CAPS: 300.0000 mg | ORAL_CAPSULE | ORAL | Status: AC

## 2022-08-11 MED ORDER — CHLORHEXIDINE GLUCONATE CLOTH 2 % EX PADS: 6.0000 | MEDICATED_PAD | Freq: Once | CUTANEOUS | Status: DC

## 2022-08-11 MED ORDER — LACTATED RINGERS IV SOLN: INTRAVENOUS | Status: DC

## 2022-08-11 MED ORDER — ORAL CARE MOUTH RINSE: 15.0000 mL | Freq: Once | OROMUCOSAL | Status: AC

## 2022-08-11 MED ORDER — BUPIVACAINE LIPOSOME 1.3 % IJ SUSP: 20.0000 mL | Freq: Once | INTRAMUSCULAR | Status: DC

## 2022-08-11 MED ORDER — FAMOTIDINE 20 MG PO TABS: 20.0000 mg | ORAL_TABLET | Freq: Once | ORAL | Status: AC

## 2022-08-11 MED ORDER — CELECOXIB 200 MG PO CAPS: 200.0000 mg | ORAL_CAPSULE | ORAL | Status: AC

## 2022-08-11 MED ORDER — ACETAMINOPHEN 500 MG PO TABS: 1000.0000 mg | ORAL_TABLET | ORAL | Status: AC

## 2022-08-11 MED ORDER — CHLORHEXIDINE GLUCONATE 0.12 % MT SOLN: 15.0000 mL | Freq: Once | OROMUCOSAL | Status: AC

## 2022-08-12 ENCOUNTER — Encounter: Payer: Self-pay | Admitting: Surgery

## 2022-08-12 ENCOUNTER — Ambulatory Visit: Payer: Medicaid Other | Admitting: Anesthesiology

## 2022-08-12 ENCOUNTER — Encounter
Admission: RE | Disposition: A | Discharge status: Home / Self Care | Payer: Self-pay | Source: Home / Self Care | Attending: Surgery

## 2022-08-12 ENCOUNTER — Ambulatory Visit
Admission: RE | Admit: 2022-08-12 | Discharge: 2022-08-12 | Disposition: A | Discharge status: Home / Self Care | Payer: Medicaid Other | Attending: Surgery | Admitting: Surgery

## 2022-08-12 ENCOUNTER — Ambulatory Visit
Admission: RE | Admit: 2022-08-12 | Discharge: 2022-08-12 | Disposition: A | Discharge status: Home / Self Care | Payer: Medicaid Other | Source: Ambulatory Visit | Attending: Surgery | Admitting: Surgery

## 2022-08-12 ENCOUNTER — Other Ambulatory Visit: Payer: Self-pay

## 2022-08-12 DIAGNOSIS — Z6836 Body mass index (BMI) 36.0-36.9, adult: Secondary | ICD-10-CM | POA: Diagnosis not present

## 2022-08-12 DIAGNOSIS — D241 Benign neoplasm of right breast: Secondary | ICD-10-CM | POA: Insufficient documentation

## 2022-08-12 DIAGNOSIS — F17210 Nicotine dependence, cigarettes, uncomplicated: Secondary | ICD-10-CM | POA: Insufficient documentation

## 2022-08-12 DIAGNOSIS — Z9889 Other specified postprocedural states: Secondary | ICD-10-CM | POA: Insufficient documentation

## 2022-08-12 DIAGNOSIS — K429 Umbilical hernia without obstruction or gangrene: Secondary | ICD-10-CM | POA: Diagnosis not present

## 2022-08-12 DIAGNOSIS — E669 Obesity, unspecified: Secondary | ICD-10-CM | POA: Diagnosis not present

## 2022-08-12 DIAGNOSIS — R928 Other abnormal and inconclusive findings on diagnostic imaging of breast: Secondary | ICD-10-CM | POA: Diagnosis not present

## 2022-08-12 DIAGNOSIS — Z7689 Persons encountering health services in other specified circumstances: Secondary | ICD-10-CM | POA: Diagnosis not present

## 2022-08-12 DIAGNOSIS — I10 Essential (primary) hypertension: Secondary | ICD-10-CM | POA: Diagnosis not present

## 2022-08-12 HISTORY — PX: BREAST BIOPSY WITH RADIO FREQUENCY LOCALIZER: SHX6895

## 2022-08-12 SURGERY — BREAST BIOPSY WITH RADIO FREQUENCY LOCALIZER
Anesthesia: General | Site: Breast | Laterality: Right

## 2022-08-12 MED ORDER — CHLORHEXIDINE GLUCONATE 0.12 % MT SOLN
OROMUCOSAL | Status: AC
  Administered 2022-08-12: 15 mL via OROMUCOSAL
  Filled 2022-08-12: qty 15

## 2022-08-12 MED ORDER — DEXAMETHASONE SODIUM PHOSPHATE 10 MG/ML IJ SOLN
INTRAMUSCULAR | Status: DC | PRN
  Administered 2022-08-12: 10 mg via INTRAVENOUS

## 2022-08-12 MED ORDER — FENTANYL CITRATE (PF) 100 MCG/2ML IJ SOLN
INTRAMUSCULAR | Status: AC
  Filled 2022-08-12: qty 2

## 2022-08-12 MED ORDER — EPHEDRINE 5 MG/ML INJ
INTRAVENOUS | Status: AC
  Filled 2022-08-12: qty 5

## 2022-08-12 MED ORDER — ONDANSETRON HCL 4 MG/2ML IJ SOLN
INTRAMUSCULAR | Status: AC
  Filled 2022-08-12: qty 2

## 2022-08-12 MED ORDER — OXYCODONE HCL 5 MG PO TABS
5.0000 mg | ORAL_TABLET | Freq: Once | ORAL | Status: AC | PRN
  Administered 2022-08-12: 5 mg via ORAL

## 2022-08-12 MED ORDER — BUPIVACAINE-EPINEPHRINE (PF) 0.25% -1:200000 IJ SOLN
INTRAMUSCULAR | Status: DC | PRN
  Administered 2022-08-12: 20 mL

## 2022-08-12 MED ORDER — STERILE WATER FOR IRRIGATION IR SOLN
Status: DC | PRN
  Administered 2022-08-12: 500 mL

## 2022-08-12 MED ORDER — CELECOXIB 200 MG PO CAPS
ORAL_CAPSULE | ORAL | Status: AC
  Administered 2022-08-12: 200 mg via ORAL
  Filled 2022-08-12: qty 1

## 2022-08-12 MED ORDER — PROPOFOL 10 MG/ML IV BOLUS
INTRAVENOUS | Status: DC | PRN
  Administered 2022-08-12: 120 mg via INTRAVENOUS
  Administered 2022-08-12: 20 mg via INTRAVENOUS

## 2022-08-12 MED ORDER — ONDANSETRON HCL 4 MG/2ML IJ SOLN
INTRAMUSCULAR | Status: DC | PRN
  Administered 2022-08-12: 4 mg via INTRAVENOUS

## 2022-08-12 MED ORDER — PHENYLEPHRINE HCL-NACL 20-0.9 MG/250ML-% IV SOLN
INTRAVENOUS | Status: AC
  Filled 2022-08-12: qty 250

## 2022-08-12 MED ORDER — ACETAMINOPHEN 500 MG PO TABS
ORAL_TABLET | ORAL | Status: AC
  Administered 2022-08-12: 1000 mg via ORAL
  Filled 2022-08-12: qty 2

## 2022-08-12 MED ORDER — FENTANYL CITRATE (PF) 100 MCG/2ML IJ SOLN: 25.0000 ug | INTRAMUSCULAR | Status: DC | PRN

## 2022-08-12 MED ORDER — ACETAMINOPHEN 10 MG/ML IV SOLN: 1000.0000 mg | Freq: Once | INTRAVENOUS | Status: DC | PRN

## 2022-08-12 MED ORDER — EPHEDRINE SULFATE (PRESSORS) 50 MG/ML IJ SOLN
INTRAMUSCULAR | Status: DC | PRN
  Administered 2022-08-12: 5 mg via INTRAVENOUS
  Administered 2022-08-12: 10 mg via INTRAVENOUS

## 2022-08-12 MED ORDER — BUPIVACAINE-EPINEPHRINE (PF) 0.25% -1:200000 IJ SOLN
INTRAMUSCULAR | Status: AC
  Filled 2022-08-12: qty 30

## 2022-08-12 MED ORDER — DEXAMETHASONE SODIUM PHOSPHATE 10 MG/ML IJ SOLN
INTRAMUSCULAR | Status: AC
  Filled 2022-08-12: qty 1

## 2022-08-12 MED ORDER — LIDOCAINE HCL (CARDIAC) PF 100 MG/5ML IV SOSY
PREFILLED_SYRINGE | INTRAVENOUS | Status: DC | PRN
  Administered 2022-08-12: 100 mg via INTRAVENOUS

## 2022-08-12 MED ORDER — OXYCODONE HCL 5 MG PO TABS
ORAL_TABLET | ORAL | Status: AC
  Filled 2022-08-12: qty 1

## 2022-08-12 MED ORDER — HYDROCODONE-ACETAMINOPHEN 5-325 MG PO TABS: 1.0000 | ORAL_TABLET | Freq: Four times a day (QID) | ORAL | 0 refills | Status: DC | PRN

## 2022-08-12 MED ORDER — ONDANSETRON HCL 4 MG/2ML IJ SOLN: 4.0000 mg | Freq: Once | INTRAMUSCULAR | Status: DC | PRN

## 2022-08-12 MED ORDER — PROPOFOL 10 MG/ML IV BOLUS
INTRAVENOUS | Status: AC
  Filled 2022-08-12: qty 20

## 2022-08-12 MED ORDER — BUPIVACAINE LIPOSOME 1.3 % IJ SUSP
INTRAMUSCULAR | Status: AC
  Filled 2022-08-12: qty 20

## 2022-08-12 MED ORDER — FAMOTIDINE 20 MG PO TABS
ORAL_TABLET | ORAL | Status: AC
  Administered 2022-08-12: 20 mg via ORAL
  Filled 2022-08-12: qty 1

## 2022-08-12 MED ORDER — CEFAZOLIN SODIUM-DEXTROSE 2-4 GM/100ML-% IV SOLN
INTRAVENOUS | Status: AC
  Filled 2022-08-12: qty 100

## 2022-08-12 MED ORDER — FENTANYL CITRATE (PF) 100 MCG/2ML IJ SOLN
INTRAMUSCULAR | Status: DC | PRN
  Administered 2022-08-12: 15 ug via INTRAVENOUS
  Administered 2022-08-12: 10 ug via INTRAVENOUS
  Administered 2022-08-12 (×3): 25 ug via INTRAVENOUS

## 2022-08-12 MED ORDER — LACTATED RINGERS IV SOLN: INTRAVENOUS | Status: DC

## 2022-08-12 MED ORDER — OXYCODONE HCL 5 MG/5ML PO SOLN: 5.0000 mg | Freq: Once | ORAL | Status: AC | PRN

## 2022-08-12 MED ORDER — GABAPENTIN 300 MG PO CAPS
ORAL_CAPSULE | ORAL | Status: AC
  Administered 2022-08-12: 300 mg via ORAL
  Filled 2022-08-12: qty 1

## 2022-08-12 MED ORDER — PHENYLEPHRINE HCL-NACL 20-0.9 MG/250ML-% IV SOLN
INTRAVENOUS | Status: DC | PRN
  Administered 2022-08-12: 50 ug/min via INTRAVENOUS

## 2022-08-12 SURGICAL SUPPLY — 42 items
ADH SKN CLS APL DERMABOND .7 (GAUZE/BANDAGES/DRESSINGS) ×1
APL PRP STRL LF DISP 70% ISPRP (MISCELLANEOUS) ×1
APPLIER CLIP 9.375 SM OPEN (CLIP)
APR CLP SM 9.3 20 MLT OPN (CLIP)
BLADE SURG 15 STRL LF DISP TIS (BLADE) ×1 IMPLANT
BLADE SURG 15 STRL SS (BLADE) ×1
CHLORAPREP W/TINT 26 (MISCELLANEOUS) ×1 IMPLANT
CLIP APPLIE 9.375 SM OPEN (CLIP) IMPLANT
CNTNR SPEC 2.5X3XGRAD LEK (MISCELLANEOUS)
CONT SPEC 4OZ STER OR WHT (MISCELLANEOUS)
CONT SPEC 4OZ STRL OR WHT (MISCELLANEOUS)
CONTAINER SPEC 2.5X3XGRAD LEK (MISCELLANEOUS) IMPLANT
DERMABOND ADVANCED .7 DNX12 (GAUZE/BANDAGES/DRESSINGS) ×1 IMPLANT
DEVICE DUBIN SPECIMEN MAMMOGRA (MISCELLANEOUS) ×1 IMPLANT
DRAPE LAPAROTOMY TRNSV 106X77 (MISCELLANEOUS) ×1 IMPLANT
ELECT CAUTERY BLADE TIP 2.5 (TIP) ×1
ELECT REM PT RETURN 9FT ADLT (ELECTROSURGICAL) ×1
ELECTRODE CAUTERY BLDE TIP 2.5 (TIP) ×1 IMPLANT
ELECTRODE REM PT RTRN 9FT ADLT (ELECTROSURGICAL) ×1 IMPLANT
GAUZE 4X4 16PLY ~~LOC~~+RFID DBL (SPONGE) ×1 IMPLANT
GLOVE ORTHO TXT STRL SZ7.5 (GLOVE) ×1 IMPLANT
GOWN STRL REUS W/ TWL LRG LVL3 (GOWN DISPOSABLE) ×1 IMPLANT
GOWN STRL REUS W/ TWL XL LVL3 (GOWN DISPOSABLE) ×1 IMPLANT
GOWN STRL REUS W/TWL LRG LVL3 (GOWN DISPOSABLE) ×1
GOWN STRL REUS W/TWL XL LVL3 (GOWN DISPOSABLE) ×1
KIT MARKER MARGIN INK (KITS) IMPLANT
KIT TURNOVER KIT A (KITS) ×1 IMPLANT
MANIFOLD NEPTUNE II (INSTRUMENTS) ×1 IMPLANT
NEEDLE HYPO 22GX1.5 SAFETY (NEEDLE) ×1 IMPLANT
PACK BASIN MINOR ARMC (MISCELLANEOUS) ×1 IMPLANT
SET LOCALIZER 20 PROBE US (MISCELLANEOUS) ×1 IMPLANT
SUT MNCRL 4-0 (SUTURE) ×1
SUT MNCRL 4-0 27XMFL (SUTURE) ×1
SUT SILK 2 0 SH (SUTURE) IMPLANT
SUT VIC AB 3-0 SH 27 (SUTURE) ×1
SUT VIC AB 3-0 SH 27X BRD (SUTURE) ×1 IMPLANT
SUTURE MNCRL 4-0 27XMF (SUTURE) ×1 IMPLANT
SYR 20ML LL LF (SYRINGE) ×1 IMPLANT
TRAP FLUID SMOKE EVACUATOR (MISCELLANEOUS) ×1 IMPLANT
TRAP NEPTUNE SPECIMEN COLLECT (MISCELLANEOUS) ×1 IMPLANT
WATER STERILE IRR 1000ML POUR (IV SOLUTION) ×1 IMPLANT
WATER STERILE IRR 500ML POUR (IV SOLUTION) ×1 IMPLANT

## 2022-08-12 NOTE — Discharge Instructions (Signed)

## 2022-08-12 NOTE — Anesthesia Preprocedure Evaluation (Addendum)
Anesthesia Evaluation  Patient identified by MRN, date of birth, ID band Patient awake    Reviewed: Allergy & Precautions, NPO status , Patient's Chart, lab work & pertinent test results  History of Anesthesia Complications Negative for: history of anesthetic complications  Airway Mallampati: IV   Neck ROM: Full    Dental  (+) Edentulous Upper, Edentulous Lower   Pulmonary Current Smoker (1/3 ppd)Patient did not abstain from smoking.,    Pulmonary exam normal breath sounds clear to auscultation       Cardiovascular hypertension, Normal cardiovascular exam Rhythm:Regular Rate:Normal     Neuro/Psych negative neurological ROS     GI/Hepatic GERD  ,(+) Cirrhosis       , Hepatitis -, C  Endo/Other  Obesity   Renal/GU negative Renal ROS     Musculoskeletal  (+) Arthritis ,   Abdominal   Peds  Hematology negative hematology ROS (+)   Anesthesia Other Findings   Reproductive/Obstetrics                            Anesthesia Physical Anesthesia Plan  ASA: 3  Anesthesia Plan: General   Post-op Pain Management:    Induction: Intravenous  PONV Risk Score and Plan: 2 and Ondansetron, Dexamethasone and Treatment may vary due to age or medical condition  Airway Management Planned: LMA  Additional Equipment:   Intra-op Plan:   Post-operative Plan: Extubation in OR  Informed Consent: I have reviewed the patients History and Physical, chart, labs and discussed the procedure including the risks, benefits and alternatives for the proposed anesthesia with the patient or authorized representative who has indicated his/her understanding and acceptance.     Dental advisory given  Plan Discussed with: CRNA  Anesthesia Plan Comments: (Patient consented for risks of anesthesia including but not limited to:  - adverse reactions to medications - damage to eyes, teeth, lips or other oral  mucosa - nerve damage due to positioning  - sore throat or hoarseness - damage to heart, brain, nerves, lungs, other parts of body or loss of life  Informed patient about role of CRNA in peri- and intra-operative care.  Patient voiced understanding.)        Anesthesia Quick Evaluation

## 2022-08-12 NOTE — Interval H&P Note (Signed)
History and Physical Interval Note:  08/12/2022 7:36 AM  Samantha Evans  has presented today for surgery, with the diagnosis of Papilloma right breast.  The various methods of treatment have been discussed with the patient and family. After consideration of risks, benefits and other options for treatment, the patient has consented to  Procedure(s): BREAST BIOPSY WITH RADIO FREQUENCY LOCALIZER, Scout Reflector (Right) as a surgical intervention.  The patient's history has been reviewed, patient examined, no change in status, stable for surgery.  I have reviewed the patient's chart and labs.  Questions were answered to the patient's satisfaction.   The right side is marked.   Ronny Bacon

## 2022-08-12 NOTE — Transfer of Care (Signed)
Immediate Anesthesia Transfer of Care Note  Patient: Samantha Evans  Procedure(s) Performed: BREAST BIOPSY- Scientist, clinical (histocompatibility and immunogenetics) (Right: Breast)  Patient Location: PACU  Anesthesia Type:General  Level of Consciousness: awake, alert  and oriented  Airway & Oxygen Therapy: Patient Spontanous Breathing and Patient connected to face mask oxygen  Post-op Assessment: Report given to RN and Post -op Vital signs reviewed and stable  Post vital signs: stable  Last Vitals:  Vitals Value Taken Time  BP 140/66 08/12/22 0908  Temp    Pulse 74 08/12/22 0911  Resp 21 08/12/22 0911  SpO2 92 % 08/12/22 0911  Vitals shown include unvalidated device data.  Last Pain:  Vitals:   08/12/22 0742  PainSc: 0-No pain         Complications: No notable events documented.

## 2022-08-12 NOTE — Op Note (Signed)
  Pre-operative Diagnosis: Papilloma right breast with atypia    Post-operative Diagnosis: Same  Surgeon: Ronny Bacon, M.D., FACS  Anesthesia: General LMA  Procedure: Right breast excisional biopsy, Scout tag directed  Procedure Details  The patient was seen again in the Holding Room. The benefits, complications, treatment options, and expected outcomes were discussed with the patient. The risks of bleeding, infection, recurrence of symptoms, failure to resolve symptoms, hematoma, seroma, open wound, cosmetic deformity, and the need for further surgery were discussed.  The patient was taken to Operating Room, identified as Samantha Evans and the procedure verified.  A Time Out was held and the above information confirmed.  Prior to the induction of general anesthesia, no antibiotic prophylaxis was administered. VTE prophylaxis was in place. The patient was positioned in the supine position. Appropriate anesthesia was then administered and tolerated well. The Scout is used to mark the skin for incision.   The chest was prepped with Chloraprep and draped in the sterile fashion.   Attention was turned to the [facility] tag localization site where an incision was made. Dissection using the Scalpel to perform an visional biopsy with adequate margins was performed.   This was done with electrocautery and sharp dissection with Metzenbaum scissors. I marked the anterior margin with a 2-0 silk suture. There was minimal bleeding, and the cavity packed.    I returned to the cavity to remove the packing, and hemostasis was confirmed with electrocautery.   Once assuring that hemostasis was adequate and checked multiple times the wound was closed with interrupted 3-0 Vicryl followed by 4-0 subcuticular Monocryl sutures. Dermabond is utilized to seal the incision.  I instilled additional local anesthetic into the biopsy cavity.  Findings: Faxitron imaging: Both markers centrally within the  specimen.  Estimated Blood Loss: Minimal         Drains: None         Specimens: Right lateral breast tissue       Complications: None.         Condition: Stable   Ronny Bacon, M.D., [facility]  08/12/2022 ; 9:14 AM

## 2022-08-12 NOTE — Anesthesia Procedure Notes (Signed)
Procedure Name: LMA Insertion Date/Time: 08/12/2022 8:12 AM  Performed by: Natasha Mead, CRNAPre-anesthesia Checklist: Patient identified, Emergency Drugs available, Suction available, Patient being monitored and Timeout performed Patient Re-evaluated:Patient Re-evaluated prior to induction Oxygen Delivery Method: Circle system utilized Preoxygenation: Pre-oxygenation with 100% oxygen Induction Type: IV induction Ventilation: Mask ventilation without difficulty LMA: LMA inserted LMA Size: 4.0 Number of attempts: 1

## 2022-08-13 ENCOUNTER — Encounter: Payer: Self-pay | Admitting: Surgery

## 2022-08-13 NOTE — Anesthesia Postprocedure Evaluation (Signed)
Anesthesia Post Note  Patient: Samantha Evans  Procedure(s) Performed: BREAST BIOPSY- Scientist, clinical (histocompatibility and immunogenetics) (Right: Breast)  Patient location during evaluation: PACU Anesthesia Type: General Level of consciousness: awake and alert, oriented and patient cooperative Pain management: pain level controlled Vital Signs Assessment: post-procedure vital signs reviewed and stable Respiratory status: spontaneous breathing, nonlabored ventilation and respiratory function stable Cardiovascular status: blood pressure returned to baseline and stable Postop Assessment: adequate PO intake Anesthetic complications: no   No notable events documented.   Last Vitals:  Vitals:   08/12/22 0935 08/12/22 0944  BP: 125/71 134/68  Pulse: 70 66  Resp: (!) 25 20  Temp: (!) 36 C (!) 36.1 C  SpO2: 92% 92%    Last Pain:  Vitals:   08/12/22 1017  TempSrc:   PainSc: 2                  Darrin Nipper

## 2022-08-14 LAB — SURGICAL PATHOLOGY

## 2022-08-16 DIAGNOSIS — Z419 Encounter for procedure for purposes other than remedying health state, unspecified: Secondary | ICD-10-CM | POA: Diagnosis not present

## 2022-08-18 ENCOUNTER — Telehealth: Payer: Self-pay | Admitting: Emergency Medicine

## 2022-08-18 NOTE — Telephone Encounter (Signed)
Copied from [place] [id number]. Topic: Referral - Status >> Aug 17, 2022  9:11 AM Cyndi Bender wrote: Reason for CRM: Pt stated she received a call from the neurologist office but it is for a location in [facility]. Pt stated she does not have a way to [facility] so she would need the referral to be at a location in [place].

## 2022-08-27 ENCOUNTER — Encounter: Payer: Self-pay | Admitting: Surgery

## 2022-08-27 ENCOUNTER — Ambulatory Visit (INDEPENDENT_AMBULATORY_CARE_PROVIDER_SITE_OTHER): Payer: Medicaid Other | Admitting: Surgery

## 2022-08-27 VITALS — BP 160/90 | HR 76 | Temp 98.3°F | Ht 61.0 in | Wt 199.0 lb

## 2022-08-27 DIAGNOSIS — K430 Incisional hernia with obstruction, without gangrene: Secondary | ICD-10-CM | POA: Diagnosis not present

## 2022-08-27 DIAGNOSIS — R933 Abnormal findings on diagnostic imaging of other parts of digestive tract: Secondary | ICD-10-CM

## 2022-08-27 DIAGNOSIS — Z7689 Persons encountering health services in other specified circumstances: Secondary | ICD-10-CM | POA: Diagnosis not present

## 2022-08-27 DIAGNOSIS — K7469 Other cirrhosis of liver: Secondary | ICD-10-CM | POA: Diagnosis not present

## 2022-08-27 DIAGNOSIS — K432 Incisional hernia without obstruction or gangrene: Secondary | ICD-10-CM

## 2022-08-27 NOTE — Patient Instructions (Addendum)
The swelling will go down as your body reabsorbs the fluid.  Keep a well fitting bra on.  We will see you back here in 6 months with a mammogram to the right breast first and then an office visit. We will send you a letter about these appointments.    We would like for you to try to stop smoking for at least 3 weeks prior to surgery.  You have requested to have a Ventral Hernia Repair. This will be done by Dr. Christian Mate at [facility]. Please see your (BLUE) Pre-care sheet for more information. Our surgery scheduler will call you to verify surgery date and to go over information.   You will need to arrange to be out of work for approximately 1-2 weeks and then you may return with a lifting restriction for 4 more weeks. If you have FMLA or Disability paperwork that needs to be filled out, please have your company fax your paperwork to [phone] or you may drop this by either office. This paperwork will be filled out within 3 days after your surgery has been completed.  Ventral Hernia A ventral hernia (also called an incisional hernia) is a hernia that occurs at the site of a previous surgical cut (incision) in the abdomen. The abdominal wall spans from your lower chest down to your pelvis. If the abdominal wall is weakened from a surgical incision, a hernia can occur. A hernia is a bulge of bowel or muscle tissue pushing out on the weakened part of the abdominal wall. Ventral hernias can get bigger from straining or lifting. Obese and older people are at higher risk for a ventral hernia. People who develop infections after surgery or require repeat incisions at the same site on the abdomen are also at increased risk. CAUSES  A ventral hernia occurs because of weakness in the abdominal wall at an incision site.  SYMPTOMS  Common symptoms include: A visible bulge or lump on the abdominal wall. Pain or tenderness around the lump. Increased discomfort if you cough or make a sudden movement. If the  hernia has blocked part of the intestine, a serious complication can occur (incarcerated or strangulated hernia). This can become a problem that requires emergency surgery because the blood flow to the blocked intestine may be cut off. Symptoms may include: Feeling sick to your stomach (nauseous). Throwing up (vomiting). Stomach swelling (distention) or bloating. Fever. Rapid heartbeat. DIAGNOSIS  Your health care provider will take a medical history and perform a physical exam. Various tests may be ordered, such as: Blood tests. Urine tests. Ultrasonography. X-rays. Computed tomography (CT). TREATMENT  Watchful waiting may be all that is needed for a smaller hernia that does not cause symptoms. Your health care provider may recommend the use of a supportive belt (truss) that helps to keep the abdominal wall intact. For larger hernias or those that cause pain, surgery to repair the hernia is usually recommended. If a hernia becomes strangulated, emergency surgery needs to be done right away. HOME CARE INSTRUCTIONS Avoid putting pressure or strain on the abdominal area. Avoid heavy lifting. Use good body positioning for physical tasks. Ask your health care provider about proper body positioning. Use a supportive belt as directed by your health care provider. Maintain a healthy weight. Eat foods that are high in fiber, such as whole grains, fruits, and vegetables. Fiber helps prevent difficult bowel movements (constipation). Drink enough fluids to keep your urine clear or pale yellow. Follow up with your health care provider  as directed. SEEK MEDICAL CARE IF:  Your hernia seems to be getting larger or more painful. SEEK IMMEDIATE MEDICAL CARE IF:  You have abdominal pain that is sudden and sharp. Your pain becomes severe. You have repeated vomiting. You are sweating a lot. You notice a rapid heartbeat. You develop a fever. MAKE SURE YOU:  Understand these instructions. Will watch  your condition. Will get help right away if you are not doing well or get worse.   This information is not intended to replace advice given to you by your health care provider. Make sure you discuss any questions you have with your health care provider.   Document Released: 10/19/2012 Document Revised: 11/23/2014 Document Reviewed: 10/19/2012 Elsevier Interactive Patient Education 2016 [employer]    Laparoscopic Ventral Hernia Repair Laparoscopic ventral hernia repair is a surgery to fix a ventral hernia. A ventral hernia, also called an incisional hernia, is a bulge of body tissue or intestines that pushes through the front part of the abdomen. This can happen if the connective tissue covering the muscles over the abdomen has a weak spot or is torn because of a surgical cut (incision) from a previous surgery. Laparoscopic ventral hernia repair is often done soon after diagnosis to stop the hernia from getting bigger, becoming uncomfortable, or becoming an emergency. This surgery usually takes about 2 hours, but the time can vary greatly. LET [facility] CARE PROVIDER KNOW ABOUT: Any allergies you have. All medicines you are taking, including steroids, vitamins, herbs, eye drops, creams, and over-the-counter medicines. Previous problems you or members of your family have had with the use of anesthetics. Any blood disorders you have. Previous surgeries you have had. Medical conditions you have. RISKS AND COMPLICATIONS  Generally, laparoscopic ventral hernia repair is a safe procedure. However, as with any surgical procedure, problems can occur. Possible problems include: Bleeding. Trouble passing urine or having a bowel movement after the surgery. Infection. Pneumonia. Blood clots. Pain in the area of the hernia. A bulge in the area of the hernia that may be caused by a collection of fluid. Injury to intestines or other structures in the abdomen. Return of the hernia after  surgery. In some cases, your health care provider may need to stop the laparoscopic procedure and do regular, open surgery. This may be necessary for very difficult hernias, when organs are hard to see, or when bleeding problems occur during surgery. BEFORE THE PROCEDURE  You may need to have blood tests, urine tests, a chest X-ray, or an electrocardiogram done before the day of the surgery. Ask your health care provider about changing or stopping your regular medicines. This is especially important if you are taking diabetes medicines or blood thinners. You may need to wash with a special type of germ-killing soap. Do not eat or drink anything after midnight the night before the procedure or as directed by your health care provider. Make plans to have someone drive you home after the procedure. PROCEDURE  Small monitors will be put on your body. They are used to check your heart, blood pressure, and oxygen level. An IV access tube will be put into a vein in your hand or arm. Fluids and medicine will flow directly into your body through the IV tube. You will be given medicine that makes you go to sleep (general anesthetic). Your abdomen will be cleaned with a special soap to kill any germs on your skin. Once you are asleep, several small incisions will be made in  your abdomen. The large space in your abdomen will be filled with air so that it expands. This gives your health care provider more room and a better view. A thin, lighted tube with a tiny camera on the end (laparoscope) is put through a small incision in your abdomen. The camera on the laparoscope sends a picture to a TV screen in the operating room. This gives your health care provider a good view inside your abdomen. Hollow tubes are put through the other small incisions in your abdomen. The tools needed for the procedure are put through these tubes. Your health care provider puts the tissue or intestines that formed the hernia back in  place. A screen-like patch (mesh) is used to close the hernia. This helps make the area stronger. Stitches, tacks, or staples are used to keep the mesh in place. Medicine and a bandage (dressing) or skin glue will be put over the incisions. AFTER THE PROCEDURE  You will stay in a recovery area until the anesthetic wears off. Your blood pressure and pulse will be checked often. You may be able to go home the same day or may need to stay in the hospital for 1-2 days after surgery. Your health care provider will decide when you can go home. You may feel some pain. You may be given medicine for pain. You will be urged to do breathing exercises that involve taking deep breaths. This helps prevent a lung infection after a surgery. You may have to wear compression stockings while you are in the hospital. These stockings help keep blood clots from forming in your legs.   This information is not intended to replace advice given to you by your health care provider. Make sure you discuss any questions you have with your health care provider.   Document Released: 10/19/2012 Document Revised: 11/07/2013 Document Reviewed: 10/19/2012 Elsevier Interactive Patient Education [employer].

## 2022-08-27 NOTE — Progress Notes (Signed)
Patient ID: Samantha Evans, female   DOB: 04/28/1961, 61 y.o.   MRN: [id number]  Chief Complaint: Presents today with known periumbilical hernia.  Follow-up after excisional biopsy right breast.  History of Present Illness Expected post lumpectomy right breast tenderness, resolving ecchymosis, denies fevers and chills.  Pathology reported below.   I reviewed her prior abdominal MRI/MRCP after the patient had left the office.  It was recommended she have a 37-monthfollow-up evaluation.  This would be toward the end of November.  Previously: Samantha GRIFFOis a 61y.o. female with the above.  Last CT scan was July 2023 confirming the umbilical periumbilical hernia defect.  Prior hernia repair in April 2022.  Reportedly no mesh was utilized at that time as she presented with bowel obstruction.  Currently complains of discomfort during the day but otherwise only presents with a bulge. Of greater importance today is a recent core biopsy obtained from the right breast lesion showing an atypical papillary lesion.  This was picked up on screening mammography.  She had no history of nipple discharge, palpable breast mass or other breast change.  Past Medical History Past Medical History:  Diagnosis Date   Arthritis    Carpal tunnel syndrome    Cataracts, bilateral    [place]    Hepatitis-C    Hypertension       Past Surgical History:  Procedure Laterality Date   BREAST BIOPSY WITH RADIO FREQUENCY LOCALIZER Right 08/12/2022   Procedure: BREAST BIOPSY- SScientist, clinical (histocompatibility and immunogenetics)  Surgeon: RRonny Bacon MD;  Location: [facility];  Service: General;  Laterality: Right;   CATARACT EXTRACTION Bilateral    CHOLECYSTECTOMY     ERCP     UMBILICAL HERNIA REPAIR N/A 02/27/2021   Procedure: REPAIR INCARCERATED UMBILICAL HERNIA;  Surgeon: TGeorganna Skeans MD;  [facility]  Service: General;  Laterality: N/A;    No Known Allergies  Current Outpatient Medications  Medication Sig Dispense Refill    amLODipine (NORVASC) 5 MG tablet Take 1 tablet (5 mg total) by mouth daily. 90 tablet 1   CALCIUM CITRATE PO Take 1 tablet by mouth See admin instructions. Take 1 tablet by mouth every 3-4 days.     HYDROcodone-acetaminophen (NORCO/VICODIN) 5-325 MG tablet Take 1 tablet by mouth every 6 (six) hours as needed for moderate pain. 15 tablet 0   ibuprofen (ADVIL) 200 MG tablet Take 400 mg by mouth daily as needed for headache.     magnesium hydroxide (MILK OF MAGNESIA) 400 MG/5ML suspension Take by mouth daily as needed for mild constipation.     Psyllium (METAMUCIL PO) Take 16 oz by mouth See admin instructions. Take 16 oz by mouth every 2 days.     spironolactone (ALDACTONE) 50 MG tablet Take 1 tablet (50 mg total) by mouth daily. 30 tablet 5   No current facility-administered medications for this visit.    Family History Family History  Problem Relation Age of Onset   Other Mother        Hit by a train while in her truck   Lung cancer Father    Liver disease Neg Hx    Colon cancer Neg Hx    Esophageal cancer Neg Hx    Pancreatic cancer Neg Hx    Stomach cancer Neg Hx       Social History Social History   Tobacco Use   Smoking status: Every Day    Packs/day: 0.50    Years: 40.00    Total pack  years: 20.00    Types: Cigarettes    Passive exposure: Past   Smokeless tobacco: Never   Tobacco comments:    Quit smoking on 02/27/2021 when hernia repair surgery occurred.  Back to smoking .5 pack of cigarettes daily  Vaping Use   Vaping Use: Never used  Substance Use Topics   Alcohol use: Not Currently   Drug use: Not Currently        Review of Systems  Constitutional: Negative.   HENT: Negative.    Eyes: Negative.   Respiratory: Negative.    Cardiovascular:  Positive for leg swelling.  Gastrointestinal:  Positive for abdominal pain.  Genitourinary:  Positive for frequency.  Skin: Negative.   Neurological:  Positive for headaches.  Psychiatric/Behavioral: Negative.    All  other systems reviewed and are negative.     Physical Exam Blood pressure (!) 160/90, pulse 76, temperature 98.3 F (36.8 C), height '5\' 1"'$  (1.549 m), weight 199 lb (90.3 kg), SpO2 98 %. Last Weight  Most recent update: 08/27/2022  9:10 AM    Weight  90.3 kg (199 lb)             CONSTITUTIONAL: Well developed, and nourished, appropriately responsive and aware without distress.   EYES: Sclera non-icteric.   EARS, NOSE, MOUTH AND THROAT:  The oropharynx is clear. Oral mucosa is pink and moist.   Hearing is intact to voice.  NECK: Trachea is midline, and there is no jugular venous distension.  LYMPH NODES:  Lymph nodes in the neck are not enlarged. RESPIRATORY:  Lungs are clear, and breath sounds are equal bilaterally. Normal respiratory effort without pathologic use of accessory muscles. CARDIOVASCULAR: Heart is regular in rate and rhythm. GI: The abdomen is soft, nontender, and nondistended.  There is a reducible midline periumbilical hernia, difficult to fully appreciate fascial defects present.  There were no palpable masses. I did not appreciate hepatosplenomegaly. There were normal bowel sounds.  No succussion splash of ascites.  No obvious stigmata of cirrhosis/portal hypertension. Caryl Lyn present as chaperone: Right breast examination, lateral incision is clean, dry and intact.  There is resolving ecchymosis present.  No induration, erythema or fluctuance to suggest hematoma or infection. MUSCULOSKELETAL:  Symmetrical muscle tone appreciated in all four extremities.    SKIN: Skin turgor is normal. No pathologic skin lesions appreciated.  NEUROLOGIC:  Motor and sensation appear grossly normal.  Cranial nerves are grossly without defect. PSYCH:  Alert and oriented to person, place and time. Affect is appropriate for situation.  Data Reviewed I have personally reviewed what is currently available of the patient's imaging, recent labs and medical records.   Labs:     Latest Ref  Rng & Units 06/07/2022    9:25 PM 05/26/2022   11:28 AM 03/13/2022    7:50 AM  CBC  WBC 4.0 - 10.5 K/uL 5.5  4.3  5.0   Hemoglobin 12.0 - 15.0 g/dL 12.4  12.5  11.8   Hematocrit 36.0 - 46.0 % 36.5  35.9  36.0   Platelets 150 - 400 K/uL 119  130  120       Latest Ref Rng & Units 06/23/2022   11:32 AM 06/07/2022    9:25 PM 05/26/2022   11:28 AM  CMP  Glucose 70 - 99 mg/dL  133  89   BUN 8 - 23 mg/dL  22  19   Creatinine 0.44 - 1.00 mg/dL  0.90  0.90   Sodium 135 - 145 mmol/L  136  139   Potassium 3.5 - 5.1 mmol/L  4.3  4.1   Chloride 98 - 111 mmol/L  106  104   CO2 22 - 32 mmol/L  24  22   Calcium 8.7 - 10.3 mg/dL 11.1  10.8  10.9   Total Protein 6.5 - 8.1 g/dL  7.2  7.3   Total Bilirubin 0.3 - 1.2 mg/dL  0.8  0.9   Alkaline Phos 38 - 126 U/L  106  124   AST 15 - 41 U/L  33  33   ALT 0 - 44 U/L  21  18    Surgical Pathology: Diagnosis Breast, right, needle core biopsy, 9:00 3 cmfn - ATYPICAL PAPILLARY LESION, RECOMMEND EXCISION (SEE DIAGNOSIS NOTE). - MICROCALCIFICATIONS PRESENT.  Imaging: CLINICAL DATA:  Patient returns from baseline screening mammogram for evaluation of possible RIGHT breast asymmetry.   EXAM: DIGITAL DIAGNOSTIC UNILATERAL RIGHT MAMMOGRAM WITH TOMOSYNTHESIS; ULTRASOUND RIGHT BREAST LIMITED   TECHNIQUE: Right digital diagnostic mammography and breast tomosynthesis was performed.; Targeted ultrasound examination of the right breast was performed   COMPARISON:  06/16/2022   ACR Breast Density Category b: There are scattered areas of fibroglandular density.   FINDINGS: Additional 2-D and 3-D images are performed. These views show no persistent mass or asymmetry in the LATERAL aspect of the RIGHT breast.   Targeted ultrasound is performed, showing no definitive sonographic correlate for the screening abnormality. An irregular mass with irregular margins and posterior acoustic enhancement is present in the 9 o'clock location of the RIGHT breast 3  centimeters from the nipple, likely an incidental finding. There is no associated internal blood flow.   Evaluation of the RIGHT axilla is negative for adenopathy.   IMPRESSION: 1. No persistent asymmetry in the RIGHT breast. 2. Likely incidental indeterminate mass in the 9 o'clock location of the RIGHT breast for which biopsy is recommended.   RECOMMENDATION: Ultrasound-guided core biopsy of RIGHT breast mass.   I have discussed the findings and recommendations with the patient. If applicable, a reminder letter will be sent to the patient regarding the next appointment.   BI-RADS CATEGORY  4: Suspicious.     Electronically Signed   By: Nolon Nations M.D.   On: 07/02/2022 16:12   SURGICAL PATHOLOGY  Excisional Biopsy of the above lesion.  CASE: [id number]  PATIENT: [facility]  Surgical Pathology Report   Specimen Submitted:  A. Breast, right lateral   Clinical History: Papilloma right breast    DIAGNOSIS:  A.  BREAST, RIGHT LATERAL; IMAGE LOCALIZED EXCISION:  - MULTIPLE INTRADUCTAL PAPILLOMATA/PAPILLOMATOSIS.  - RADIAL SCARS, FOCI OF SCLEROSING ADENOSIS AND MICROCALCIFICATIONS.  - SMALL HYALINIZED FIBROADENOMA WITH CALCIFICATIONS.  - BIOPSY CLIP AND PREVIOUS BIOPSY SITE RELATED CHANGES.  - FEW MARGINS ARE CLOSE BUT NEGATIVE FOR PAPILLOMA.   Comment  No atypical hyperplasia or malignancy are seen.  The spectrum of changes  noted above and, in multiple sections of this lumpectomy specimen may  represent a complex sclerosing lesion.   GROSS DESCRIPTION:  A. Labeled: Right lateral breast tissue  Received: Without fixative  Specimen radiograph image(s) available for review  Radiographic findings: A clip and RFID tag are seen on imaging.  Time in fixative: Specimen is collected at 8:40 AM and placed in  formalin at 8:56 AM on 08/12/2022.  Cold  ischemic time: Less than 30 minutes  Total fixation time: 10 hours  Type of  procedure: Breast biopsy with radial frequency localizer  Location / laterality of specimen: Right  Orientation of specimen: Specimen is oriented with a suture at anterior  margin.  Remaining presumed orientation discussed with pathologist.  Inking:  Note: Margins presumed due to single suture orientation of specimen.  Anterior = green  Inferior = blue  Lateral = orange  Medial = yellow  Posterior = black  Superior = red  Size of specimen: 15.7 g, 5.6 cm (anterior to posterior) x 4.7 cm  (medial to lateral) x 1.2 cm (superior to inferior)  Skin: None present  Biopsy site: A silver-colored clip is identified within tissue.  Number of discrete masses: 1  Size of mass(es): 1.4 (medial to lateral) x 0.7 cm (anterior to  posterior) x 0.7 cm (superior to inferior)  Description of mass(es): A single ill-defined area of rubbery pink-tan  fibrous tissue containing a silver-colored metallic clip and RFID tag.  Distance between masses/clips: Clip and tag within mass.  Margins: Abutting inferior margin, 0.4 = lateral, 1.2 = medial, 0.1 =  superior, 2.0 = posterior, 3.1 = anterior  Description of remainder of tissue: Remaining cut surfaces are  yellow-tan and fatty, with few additional fibrous foci, measuring up to  0.4 cm in greatest dimension.  Ratio of fatty tissue to fibrous tissue  is 98/2.  Hemorrhage is noted in planes immediately anterior to clip  location.  No additional masses, calcifications, or areas of hemorrhage  are grossly identified.  Specimen is entirely submitted from anterior to  posterior.     CLINICAL DATA:  Acute abdominal pain   EXAM: CT ABDOMEN AND PELVIS WITH CONTRAST   TECHNIQUE: Multidetector CT imaging of the abdomen and pelvis was performed using the standard protocol following bolus administration of intravenous contrast.   RADIATION DOSE REDUCTION: This exam was performed according to the departmental dose-optimization program which includes  automated exposure control, adjustment of the mA and/or kV according to patient size and/or use of iterative reconstruction technique.   CONTRAST:  131m OMNIPAQUE IOHEXOL 300 MG/ML  SOLN   COMPARISON:  None Available.   FINDINGS: Lower Chest: Normal.   Hepatobiliary: Diffusely nodular hepatic contours, consistent with hepatic cirrhosis. No focal liver lesion. There is unchanged intrahepatic biliary dilatation. Unchanged cyst in the left hepatic lobe. Status post cholecystectomy.   Pancreas: Normal pancreas. No ductal dilatation or peripancreatic fluid collection.   Spleen: Normal.   Adrenals/Urinary Tract: The adrenal glands are normal. No hydronephrosis, nephroureterolithiasis or solid renal mass. The urinary bladder is normal for degree of distention   Stomach/Bowel: There is no hiatal hernia. Normal duodenal course and caliber. There is a ventral abdominal hernia that contains a loop of small bowel with mild adjacent fat stranding and a small amount of fluid in the hernia sac. No proximal dilatation. Slightly superiorly, there is a second fat containing ventral hernia. Rectosigmoid diverticulosis without acute inflammation. Normal appendix.   Vascular/Lymphatic: There is calcific atherosclerosis of the abdominal aorta. No lymphadenopathy.   Reproductive: Normal uterus. No adnexal mass.   Other: None.   Musculoskeletal: No bony spinal canal stenosis or focal osseous abnormality.   IMPRESSION: 1. Ventral abdominal hernia that contains a loop of small bowel with mild adjacent fat stranding and a small amount of fluid in the hernia sac. This may indicate early strangulation. 2. Hepatic cirrhosis. 3. Rectosigmoid diverticulosis without acute inflammation. 4. Aortic Atherosclerosis (ICD10-I70.0).  Electronically Signed   By: Ulyses Jarred M.D.   On: 06/08/2022 01:38 CLINICAL DATA:  Cirrhosis, liver cysts, pancreatic cystic lesions   EXAM: MRI ABDOMEN WITHOUT  AND WITH CONTRAST (INCLUDING MRCP)   TECHNIQUE: Multiplanar multisequence MR imaging of the abdomen was performed both before and after the administration of intravenous contrast. Heavily T2-weighted images of the biliary and pancreatic ducts were obtained, and three-dimensional MRCP images were rendered by post processing.   CONTRAST:  26m GADAVIST GADOBUTROL 1 MMOL/ML IV SOLN   COMPARISON:  11/12/2021   FINDINGS: Lower chest: No acute findings.   Hepatobiliary: Coarse, nodular, cirrhotic morphology of the liver. No new liver lesions or suspicious contrast enhancement. Majority of previously described small foci of arterial hyperenhancement are not clearly appreciated, and most likely exquisitely sensitive to exact phase of arterial contrast administration, somewhat later on this examination compared to prior examination. Occasional small foci, however are visualized and not changed, for example in the anterior left lobe of the liver, hepatic segment II, a 0.5 cm focus (series 22, image 25) and in the posterior right lobe of the liver, hepatic segment VI, a 0.6 cm focus (series 22, image 34). Numerous simple, benign hepatic parenchymal and peribiliary cysts (series 15, image 14). Status post cholecystectomy. Unchanged postoperative biliary ductal dilatation.   Pancreas: Multiple cystic lesions of the pancreas, including of the head and uncinate (series 15, image 21), the ventral neck (series 15, image 19), and the tail (series 15, image 20) are unchanged and measure up to 2.1 x 1.5 cm in the tail (series 15, image 20). No solid mass, inflammatory changes, or other parenchymal abnormality identified.No pancreatic ductal dilatation.   Spleen:  Splenomegaly, maximum coronal span 16.0 cm.   Adrenals/Urinary Tract: Normal adrenal glands. Multiple simple benign bilateral renal cortical cysts, for which no further follow-up or characterization is required. No renal masses  or suspicious contrast enhancement identified. No evidence of hydronephrosis.   Stomach/Bowel: Visualized portions within the abdomen are unremarkable.   Vascular/Lymphatic: No pathologically enlarged lymph nodes identified. No abdominal aortic aneurysm demonstrated. Aortic atherosclerosis.   Other:  None.   Musculoskeletal: No suspicious osseous lesions identified.   IMPRESSION: 1. Cirrhosis. No new liver lesions or new suspicious contrast enhancement. Majority of previously described small foci of arterial hyperenhancement are not clearly appreciated today's examination, and most likely exquisitely sensitive to exact phase of arterial contrast administration. Occasional subcentimeter foci which are seen are unchanged compared to prior examination and remain consistent with LI-RADS category 3, intermediate suspicion for hepatocellular carcinoma. Recommend follow-up MR in 6 months. 2. Multiple cystic lesions of the pancreas, including of the head and uncinate are unchanged and measure up to 2.1 x 1.5 cm in the tail. 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 Given size, recommend follow-up in 6 months to ensure continued stability, which can be performed in conjunction with above recommended follow-up. 3. Splenomegaly.   Aortic Atherosclerosis (ICD10-I70.0).     Electronically Signed   By: ADelanna AhmadiM.D.   On: 04/10/2022 14:29 Assessment     Patient Active Problem List   Diagnosis Date Noted   Papilloma of right breast 07/16/2022   Ventral hernia, recurrent 07/16/2022   Unilateral primary osteoarthritis, right knee 06/04/2022   Peripheral edema 05/12/2021   Chronic hepatitis C without hepatic coma ([place] 03/25/2021   Acid reflux 03/25/2021   Incarcerated umbilical hernia 075/08/2584  [place] bowel obstruction ([place] 02/27/2021   Hepatic [place] 02/27/2021  AKI (acute kidney injury) ([place]) 02/27/2021    Plan      Patient needs follow-up imaging late Nov/Early Dec regarding hepatic lesions and pancreatic lesions and plan for MRI. We discussed proceeding with her hernia repair, however I feel it is prudent to defer surgery for the MRI follow-up evaluation. I have also encouraged her to watch her diet, improve her health with some weight loss, and stressed smoking cessation for the sake of her postoperative course.  I believe she understands this and still wants to get her hernia repaired. Risks reviewed and accepted.  All questions answered.  Face-to-face time spent with the patient and accompanying care providers(if present) was 45 minutes, with more than 50% of the time spent counseling, educating, and coordinating care of the patient.    These notes generated with voice recognition software. I apologize for typographical errors.  Ronny Bacon M.D., FACS 08/27/2022, 9:47 AM

## 2022-08-28 ENCOUNTER — Encounter: Payer: Self-pay | Admitting: Gastroenterology

## 2022-09-08 DIAGNOSIS — Z7689 Persons encountering health services in other specified circumstances: Secondary | ICD-10-CM | POA: Diagnosis not present

## 2022-09-08 DIAGNOSIS — H40013 Open angle with borderline findings, low risk, bilateral: Secondary | ICD-10-CM | POA: Diagnosis not present

## 2022-09-08 DIAGNOSIS — H5213 Myopia, bilateral: Secondary | ICD-10-CM | POA: Diagnosis not present

## 2022-09-08 DIAGNOSIS — Z961 Presence of intraocular lens: Secondary | ICD-10-CM | POA: Diagnosis not present

## 2022-09-14 ENCOUNTER — Other Ambulatory Visit: Payer: Self-pay

## 2022-09-14 ENCOUNTER — Telehealth: Payer: Self-pay

## 2022-09-14 DIAGNOSIS — K769 Liver disease, unspecified: Secondary | ICD-10-CM

## 2022-09-14 DIAGNOSIS — K862 Cyst of pancreas: Secondary | ICD-10-CM

## 2022-09-14 DIAGNOSIS — R933 Abnormal findings on diagnostic imaging of other parts of digestive tract: Secondary | ICD-10-CM

## 2022-09-14 NOTE — Telephone Encounter (Signed)
Call to patient to let her know that Dr Christian Mate wants her to have a follow up MRI of her liver and pancrease before he schedules her for any hernia repair surgery.  The patient is scheduled for an MRI of the abdomen with and without contrast at [facility] on Thursday November 9th at 3:00 pm. She will arrive at the [place] entrance at 2:30 pm and have nothing to eat or drink for 4 hours prior. The patient is aware of date, time, and instructions.

## 2022-09-16 DIAGNOSIS — Z419 Encounter for procedure for purposes other than remedying health state, unspecified: Secondary | ICD-10-CM | POA: Diagnosis not present

## 2022-09-24 ENCOUNTER — Ambulatory Visit
Admission: RE | Admit: 2022-09-24 | Discharge: 2022-09-24 | Disposition: A | Discharge status: Home / Self Care | Payer: Medicaid Other | Source: Ambulatory Visit | Attending: Surgery | Admitting: Surgery

## 2022-09-24 DIAGNOSIS — K3189 Other diseases of stomach and duodenum: Secondary | ICD-10-CM | POA: Diagnosis not present

## 2022-09-24 DIAGNOSIS — K7689 Other specified diseases of liver: Secondary | ICD-10-CM | POA: Diagnosis not present

## 2022-09-24 DIAGNOSIS — K862 Cyst of pancreas: Secondary | ICD-10-CM | POA: Insufficient documentation

## 2022-09-24 DIAGNOSIS — R933 Abnormal findings on diagnostic imaging of other parts of digestive tract: Secondary | ICD-10-CM | POA: Diagnosis not present

## 2022-09-24 DIAGNOSIS — K769 Liver disease, unspecified: Secondary | ICD-10-CM | POA: Diagnosis not present

## 2022-09-24 DIAGNOSIS — K7460 Unspecified cirrhosis of liver: Secondary | ICD-10-CM | POA: Diagnosis not present

## 2022-09-24 MED ORDER — GADOBUTROL 1 MMOL/ML IV SOLN
9.0000 mL | Freq: Once | INTRAVENOUS | Status: AC | PRN
  Administered 2022-09-24: 9 mL via INTRAVENOUS

## 2022-09-28 ENCOUNTER — Encounter: Payer: Self-pay | Admitting: Family Medicine

## 2022-09-28 ENCOUNTER — Ambulatory Visit: Payer: Medicaid Other | Attending: Family Medicine | Admitting: Family Medicine

## 2022-09-28 VITALS — BP 141/92 | HR 72 | Temp 98.0°F | Ht 61.0 in | Wt 199.0 lb

## 2022-09-28 DIAGNOSIS — K439 Ventral hernia without obstruction or gangrene: Secondary | ICD-10-CM

## 2022-09-28 DIAGNOSIS — I10 Essential (primary) hypertension: Secondary | ICD-10-CM

## 2022-09-28 DIAGNOSIS — Z23 Encounter for immunization: Secondary | ICD-10-CM | POA: Diagnosis not present

## 2022-09-28 DIAGNOSIS — F17210 Nicotine dependence, cigarettes, uncomplicated: Secondary | ICD-10-CM | POA: Diagnosis not present

## 2022-09-28 DIAGNOSIS — Z7689 Persons encountering health services in other specified circumstances: Secondary | ICD-10-CM | POA: Diagnosis not present

## 2022-09-28 MED ORDER — MISC. DEVICES MISC: 0 refills | Status: DC

## 2022-09-28 MED ORDER — SPIRONOLACTONE 50 MG PO TABS: 50.0000 mg | ORAL_TABLET | Freq: Every day | ORAL | 1 refills | Status: DC

## 2022-09-28 MED ORDER — AMLODIPINE BESYLATE 5 MG PO TABS: 5.0000 mg | ORAL_TABLET | Freq: Every day | ORAL | 1 refills | Status: DC

## 2022-09-28 MED ORDER — BUPROPION HCL ER (XL) 150 MG PO TB24: 150.0000 mg | ORAL_TABLET | Freq: Every day | ORAL | 3 refills | Status: DC

## 2022-09-28 NOTE — Progress Notes (Unsigned)
 Subjective:  Patient ID: Samantha Evans, female    DOB: 12/18/1960  Age: 61 y.o. MRN: [id number]  CC: Hypertension   HPI Samantha Evans is a 61 y.o. year old female with a history of hepatitis C, incarcerated umbilical hernia repair, hypertension.   Interval History:  She is under the care of Gen Surgery and is being worked up for repair of ventral hernia. She sometimes has abdominal pain but no nausea or vomiting.  She is unable to exercise due to pain in her right knee. Seen by orthopedic and MRI ordered but this was not approved by her insurance company.  She states orthopedic will not see her until after she has undergone MRI.  She smokes 3 Cigarettes/ day down from half a pack x40 years and is working on quitting.  I had referred her to Endocrine for Hypercalcemia but she never followed through.  I have provided her with the number to contact them to schedule an appointment. She endorses adherence with her antihypertensive. Past Medical History:  Diagnosis Date   Arthritis    Carpal tunnel syndrome    Cataracts, bilateral    Cirrhosis (HCC)    Hepatitis-C    Hypertension     Past Surgical History:  Procedure Laterality Date   BREAST BIOPSY WITH RADIO FREQUENCY LOCALIZER Right 08/12/2022   Procedure: BREAST BIOPSY- Scout Reflector;  Surgeon: Rodenberg, Denny, MD;  Location: [facility] ORS;  Service: General;  Laterality: Right;   CATARACT EXTRACTION Bilateral    CHOLECYSTECTOMY     ERCP     UMBILICAL HERNIA REPAIR N/A 02/27/2021   Procedure: REPAIR INCARCERATED UMBILICAL HERNIA;  Surgeon: Thompson, Burke, MD;  Location: [facility] OR;  Service: General;  Laterality: N/A;    Family History  Problem Relation Age of Onset   Other Mother        Hit by a train while in her truck   Lung cancer Father    Liver disease Neg Hx    Colon cancer Neg Hx    Esophageal cancer Neg Hx    Pancreatic cancer Neg Hx    Stomach cancer Neg Hx     Social History   Socioeconomic History   Marital  status: Legally Separated    Spouse name: Not on file   Number of children: 4   Years of education: Not on file   Highest education level: Not on file  Occupational History   Occupation: unemployed  Tobacco Use   Smoking status: Every Day    Packs/day: 0.50    Years: 40.00    Total pack years: 20.00    Types: Cigarettes    Passive exposure: Past   Smokeless tobacco: Never   Tobacco comments:    Quit smoking on 02/27/2021 when hernia repair surgery occurred.  Back to smoking .5 pack of cigarettes daily  Vaping Use   Vaping Use: Never used  Substance and Sexual Activity   Alcohol use: Not Currently   Drug use: Not Currently   Sexual activity: Not on file  Other Topics Concern   Not on file  Social History Narrative   Not on file   Social Determinants of Health   Financial Resource Strain: Low Risk  (07/22/2021)   Overall Financial Resource Strain (CARDIA)    Difficulty of Paying Living Expenses: Not hard at all  Food Insecurity: No Food Insecurity (07/22/2021)   Hunger Vital Sign    Worried About Running Out of Food in the Last Year: Never true      Ran Out of Food in the Last Year: Never true  Transportation Needs: Unmet Transportation Needs (09/02/2021)   PRAPARE - Transportation    Lack of Transportation (Medical): Yes    Lack of Transportation (Non-Medical): Yes  Physical Activity: Inactive (07/22/2021)   Exercise Vital Sign    Days of Exercise per Week: 0 days    Minutes of Exercise per Session: 0 min  Stress: Stress Concern Present (07/22/2021)   [place]    Feeling of Stress : To some extent  Social Connections: Socially Isolated (07/22/2021)   Social Connection and Isolation Panel [NHANES]    Frequency of Communication with Friends and Family: More than three times a week    Frequency of Social Gatherings with Friends and Family: More than three times a week    Attends Religious Services: Never    Building surveyor or Organizations: No    Attends Archivist Meetings: Never    Marital Status: Separated    No Known Allergies  Outpatient Medications Prior to Visit  Medication Sig Dispense Refill   CALCIUM CITRATE PO Take 1 tablet by mouth See admin instructions. Take 1 tablet by mouth every 3-4 days.     ibuprofen (ADVIL) 200 MG tablet Take 400 mg by mouth daily as needed for headache.     magnesium hydroxide (MILK OF MAGNESIA) 400 MG/5ML suspension Take by mouth daily as needed for mild constipation.     Psyllium (METAMUCIL PO) Take 16 oz by mouth See admin instructions. Take 16 oz by mouth every 2 days.     amLODipine (NORVASC) 5 MG tablet Take 1 tablet (5 mg total) by mouth daily. 90 tablet 1   spironolactone (ALDACTONE) 50 MG tablet Take 1 tablet (50 mg total) by mouth daily. 30 tablet 5   HYDROcodone-acetaminophen (NORCO/VICODIN) 5-325 MG tablet Take 1 tablet by mouth every 6 (six) hours as needed for moderate pain. (Patient not taking: Reported on 09/28/2022) 15 tablet 0   No facility-administered medications prior to visit.     ROS Review of Systems  Constitutional:  Negative for activity change and appetite change.  HENT:  Negative for sinus pressure and sore throat.   Respiratory:  Negative for chest tightness, shortness of breath and wheezing.   Cardiovascular:  Negative for chest pain and palpitations.  Gastrointestinal:  Negative for abdominal distention, abdominal pain and constipation.  Genitourinary: Negative.   Musculoskeletal:        See HPI  Psychiatric/Behavioral:  Negative for behavioral problems and dysphoric mood.     Objective:  BP (!) 141/92   Pulse 72   Temp 98 F (36.7 C) (Oral)   Ht 5' 1" (1.549 m)   Wt 199 lb (90.3 kg)   SpO2 97%   BMI 37.60 kg/m      09/28/2022    1:44 PM 08/27/2022    9:09 AM 08/12/2022    9:44 AM  BP/Weight  Systolic BP 774 128 786  Diastolic BP 92 90 68  Wt. (Lbs) 199 199   BMI 37.6 kg/m2 37.6 kg/m2        Physical Exam Constitutional:      Appearance: She is well-developed.  Cardiovascular:     Rate and Rhythm: Normal rate.     Heart sounds: Normal heart sounds. No murmur heard. Pulmonary:     Effort: Pulmonary effort is normal.     Breath sounds: Normal breath sounds. No wheezing or rales.  Chest:     Chest wall: No tenderness.  Abdominal:     General: Bowel sounds are normal. There is no distension.     Palpations: Abdomen is soft. There is no mass.     Tenderness: There is no abdominal tenderness.  Musculoskeletal:        General: Swelling (slight R knee edema) present.     Right lower leg: No edema.     Left lower leg: No edema.     Comments: Slight tenderness on flexion and extension of right knee  Neurological:     Mental Status: She is alert and oriented to person, place, and time.  Psychiatric:        Mood and Affect: Mood normal.        Latest Ref Rng & Units 06/23/2022   11:32 AM 06/07/2022    9:25 PM 05/26/2022   11:28 AM  CMP  Glucose 70 - 99 mg/dL  133  89   BUN 8 - 23 mg/dL  22  19   Creatinine 0.44 - 1.00 mg/dL  0.90  0.90   Sodium 135 - 145 mmol/L  136  139   Potassium 3.5 - 5.1 mmol/L  4.3  4.1   Chloride 98 - 111 mmol/L  106  104   CO2 22 - 32 mmol/L  24  22   Calcium 8.7 - 10.3 mg/dL 11.1  10.8  10.9   Total Protein 6.5 - 8.1 g/dL  7.2  7.3   Total Bilirubin 0.3 - 1.2 mg/dL  0.8  0.9   Alkaline Phos 38 - 126 U/L  106  124   AST 15 - 41 U/L  33  33   ALT 0 - 44 U/L  21  18     Lipid Panel     Component Value Date/Time   CHOL 144 05/26/2022 1128   TRIG 98 05/26/2022 1128   HDL 40 05/26/2022 1128   LDLCALC 86 05/26/2022 1128    CBC    Component Value Date/Time   WBC 5.5 06/07/2022 2125   RBC 4.20 06/07/2022 2125   HGB 12.4 06/07/2022 2125   HGB 12.5 05/26/2022 1128   HCT 36.5 06/07/2022 2125   HCT 35.9 05/26/2022 1128   PLT 119 (L) 06/07/2022 2125   PLT 130 (L) 05/26/2022 1128   MCV 86.9 06/07/2022 2125   MCV 85 05/26/2022 1128    MCH 29.5 06/07/2022 2125   MCHC 34.0 06/07/2022 2125   RDW 14.9 06/07/2022 2125   RDW 14.5 05/26/2022 1128   LYMPHSABS 1.1 05/26/2022 1128   MONOABS 0.4 01/16/2022 1610   EOSABS 0.1 05/26/2022 1128   BASOSABS 0.0 05/26/2022 1128    Lab Results  Component Value Date   HGBA1C 4.8 05/26/2022    Lab Results  Component Value Date   PTH 37 06/23/2022   PTH Comment 06/23/2022   CALCIUM 11.1 (H) 06/23/2022   CAION 1.30 02/23/2010    Assessment & Plan:  1. Essential hypertension Slightly above goal Refilled medications Counseled on blood pressure goal of less than 130/80, low-sodium, DASH diet, medication compliance, 150 minutes of moderate intensity exercise per week. Discussed medication compliance, adverse effects. - amLODipine (NORVASC) 5 MG tablet; Take 1 tablet (5 mg total) by mouth daily.  Dispense: 90 tablet; Refill: 1 - spironolactone (ALDACTONE) 50 MG tablet; Take 1 tablet (50 mg total) by mouth daily.  Dispense: 90 tablet; Refill: 1 - Misc. Devices MISC; BP monitor  Dispense: 1 each; Refill: 0    2. Smoking greater than 20 pack years Counseled on smoking cessation including hazardous effect of smoking, spent 3 minutes in total and she is willing to work on quitting Referred for lung cancer screening but she never followed through with this.  I will have my nurse to reschedule this for her. - buPROPion (WELLBUTRIN XL) 150 MG 24 hr tablet; Take 1 tablet (150 mg total) by mouth daily. For smoking cessation  Dispense: 30 tablet; Refill: 3  3. Hypercalcemia We will check calcium level again Provided with contact information for endocrinologist so she can schedule appointment as they have tried to reach out to have failed - [id number]  4. Ventral hernia without obstruction or gangrene Currently being worked up for elective cesarean section No evidence of obstruction  Meds ordered this encounter  Medications   amLODipine (NORVASC) 5 MG tablet    Sig: Take 1 tablet (5 mg  total) by mouth daily.    Dispense:  90 tablet    Refill:  1   spironolactone (ALDACTONE) 50 MG tablet    Sig: Take 1 tablet (50 mg total) by mouth daily.    Dispense:  90 tablet    Refill:  1   Misc. Devices MISC    Sig: BP monitor    Dispense:  1 each    Refill:  0   buPROPion (WELLBUTRIN XL) 150 MG 24 hr tablet    Sig: Take 1 tablet (150 mg total) by mouth daily. For smoking cessation    Dispense:  30 tablet    Refill:  3    Follow-up: Return in about 6 months (around 03/29/2023) for Chronic medical conditions.        , MD, FAAFP. [facility] [place], [place] [phone]   09/28/2022, 2:21 PM 

## 2022-09-28 NOTE — Patient Instructions (Addendum)
Please call Gi to schedule your Colonoscopy: [facility] [address], [place] [address] PH# [phone]   Please call Endocrine for your appointment to follow up on high calceium levels: [facility] [address] [place], [place] [address] [place]

## 2022-09-28 NOTE — Progress Notes (Unsigned)
Needs medication refills. Out of BP medications.

## 2022-09-29 ENCOUNTER — Encounter: Payer: Self-pay | Admitting: Family Medicine

## 2022-09-29 DIAGNOSIS — H524 Presbyopia: Secondary | ICD-10-CM | POA: Diagnosis not present

## 2022-09-29 LAB — CMP14+EGFR
ALT: 11 IU/L (ref 0–32)
AST: 30 IU/L (ref 0–40)
Albumin/Globulin Ratio: 1.3 (ref 1.2–2.2)
Albumin: 4.1 g/dL (ref 3.9–4.9)
Alkaline Phosphatase: 121 IU/L (ref 44–121)
BUN/Creatinine Ratio: 22 (ref 12–28)
BUN: 17 mg/dL (ref 8–27)
Bilirubin Total: 0.8 mg/dL (ref 0.0–1.2)
CO2: 23 mmol/L (ref 20–29)
Calcium: 11 mg/dL — ABNORMAL HIGH (ref 8.7–10.3)
Chloride: 105 mmol/L (ref 96–106)
Creatinine, Ser: 0.77 mg/dL (ref 0.57–1.00)
Globulin, Total: 3.2 g/dL (ref 1.5–4.5)
Glucose: 81 mg/dL (ref 70–99)
Potassium: 4.1 mmol/L (ref 3.5–5.2)
Sodium: 142 mmol/L (ref 134–144)
Total Protein: 7.3 g/dL (ref 6.0–8.5)
eGFR: 88 mL/min/{1.73_m2} (ref >59)

## 2022-10-05 ENCOUNTER — Telehealth: Payer: Self-pay

## 2022-10-05 NOTE — Telephone Encounter (Signed)
Message left for the patient to call to schedule her follow up with Dr Christian Mate for after her MRI to discuss hernia surgery.

## 2022-10-15 ENCOUNTER — Other Ambulatory Visit: Payer: Self-pay

## 2022-10-15 DIAGNOSIS — K869 Disease of pancreas, unspecified: Secondary | ICD-10-CM

## 2022-10-15 DIAGNOSIS — K7460 Unspecified cirrhosis of liver: Secondary | ICD-10-CM

## 2022-10-15 DIAGNOSIS — K769 Liver disease, unspecified: Secondary | ICD-10-CM

## 2022-10-16 DIAGNOSIS — Z419 Encounter for procedure for purposes other than remedying health state, unspecified: Secondary | ICD-10-CM | POA: Diagnosis not present

## 2022-10-19 ENCOUNTER — Encounter: Payer: Self-pay | Admitting: Gastroenterology

## 2022-10-19 ENCOUNTER — Telehealth: Payer: Self-pay | Admitting: Emergency Medicine

## 2022-10-19 NOTE — Telephone Encounter (Signed)
Copied from [place] [id number]. Topic: General - Inquiry >> Oct 19, 2022  1:25 PM Samantha Evans wrote: Reason for CRM: Pt is requesting a referral for endocrinology to be sent to [facility] in [place].  Pt stated the location in [facility] is too far for her transportation.  Fax- [phone]   Please revise.

## 2022-10-20 NOTE — Addendum Note (Signed)
Addended byCharlott Rakes on: 10/20/2022 01:03 PM   Modules accepted: Orders

## 2022-10-20 NOTE — Telephone Encounter (Signed)
Referral has been placed per request.

## 2022-10-21 ENCOUNTER — Other Ambulatory Visit: Payer: Self-pay | Admitting: Family Medicine

## 2022-10-21 DIAGNOSIS — F17210 Nicotine dependence, cigarettes, uncomplicated: Secondary | ICD-10-CM

## 2022-10-22 ENCOUNTER — Telehealth: Payer: Self-pay

## 2022-10-22 NOTE — Telephone Encounter (Signed)
Rescheduled MRI/MRCP with patient & radiology scheduling for a sooner time. New date is 11/17/22 at 3:00 pm, arrive by 2:30 pm, NPO 4 hours prior. Patient is aware & has also been provided radiology scheduling number and hospital address.

## 2022-10-26 ENCOUNTER — Ambulatory Visit (AMBULATORY_SURGERY_CENTER): Payer: Medicaid Other | Admitting: *Deleted

## 2022-10-26 VITALS — Ht 61.0 in | Wt 200.4 lb

## 2022-10-26 DIAGNOSIS — Z7689 Persons encountering health services in other specified circumstances: Secondary | ICD-10-CM | POA: Diagnosis not present

## 2022-10-26 DIAGNOSIS — Z86010 Personal history of colonic polyps: Secondary | ICD-10-CM

## 2022-10-26 MED ORDER — PEG 3350-KCL-NA BICARB-NACL 420 G PO SOLR: 4000.0000 mL | Freq: Once | ORAL | 0 refills | Status: AC

## 2022-10-26 NOTE — Progress Notes (Signed)
No egg or soy allergy known to patient  No issues known to pt with past sedation with any surgeries or procedures Patient denies ever being told they had issues or difficulty with intubation  No FH of Malignant Hyperthermia Pt is not on diet pills Pt is not on  home 02  Pt is not on blood thinners  Pt has issues with constipation, 2 day prep given No A fib or A flutter Have any cardiac testing pending--no Pt instructed to use Single[employer] or GoodRx for a price reduction on prep    Patient's chart reviewed by John Nulty CNRA prior to previsit and patient appropriate for the [facility].  Previsit completed and red dot placed by patient's name on their procedure day (on provider's schedule).    

## 2022-11-03 ENCOUNTER — Ambulatory Visit: Payer: Medicaid Other | Admitting: Surgery

## 2022-11-10 ENCOUNTER — Telehealth: Payer: Self-pay | Admitting: Gastroenterology

## 2022-11-10 NOTE — Telephone Encounter (Signed)
Patient called to cancel her procedure for Friday 12/29. Patient states she is sick with the flu, patient rescheduled to 1/19. Please advise

## 2022-11-13 ENCOUNTER — Encounter: Payer: Medicaid Other | Admitting: Gastroenterology

## 2022-11-16 DIAGNOSIS — Z419 Encounter for procedure for purposes other than remedying health state, unspecified: Secondary | ICD-10-CM | POA: Diagnosis not present

## 2022-11-17 ENCOUNTER — Telehealth: Payer: Self-pay

## 2022-11-17 ENCOUNTER — Encounter (HOSPITAL_COMMUNITY): Payer: Self-pay

## 2022-11-17 ENCOUNTER — Other Ambulatory Visit: Payer: Self-pay | Admitting: Gastroenterology

## 2022-11-17 ENCOUNTER — Ambulatory Visit (HOSPITAL_COMMUNITY): Admission: RE | Admit: 2022-11-17 | Payer: Medicaid Other | Source: Ambulatory Visit

## 2022-11-17 DIAGNOSIS — K7460 Unspecified cirrhosis of liver: Secondary | ICD-10-CM

## 2022-11-17 DIAGNOSIS — K769 Liver disease, unspecified: Secondary | ICD-10-CM

## 2022-11-17 DIAGNOSIS — K869 Disease of pancreas, unspecified: Secondary | ICD-10-CM

## 2022-11-17 NOTE — Telephone Encounter (Signed)
Per [employer], MRI that is scheduled for today will be denied if further information is not provided d/t the frequency of the scan. Previous scan was ordered & completed by our office in May 2023 & [name] recommended a 6 month follow up scan; however, it appears scan was ordered in November 2023 by Dr. Christian Mate with a different office and recommended she follow up again in 6 months. [name] notified by secure chat & she advised to follow the recommendations at the time of the November scan. Patient is aware that MRI has been cancelled for today d/t scan being too soon to schedule & to follow up with scan again in 6 months. Insurance company notified of changes & cancelled authorization. Radiology notified as well.

## 2022-11-23 NOTE — Progress Notes (Deleted)
Patient ID: Samantha Evans, female   DOB: [birth year], 62 y.o.   MRN: [id number]  Chief Complaint: Presents today with known periumbilical hernia.  Follow-up after excisional biopsy right breast.  History of Present Illness Expected post lumpectomy right breast tenderness, resolving ecchymosis, denies fevers and chills.  Pathology reported below.   I reviewed her prior abdominal MRI/MRCP after the patient had left the office.  It was recommended she have a 82-monthfollow-up evaluation.  This would be toward the end of November.  Previously: Samantha RAHMis a 62y.o. female with the above.  Last CT scan was July 2023 confirming the umbilical periumbilical hernia defect.  Prior hernia repair in April 2022.  Reportedly no mesh was utilized at that time as she presented with bowel obstruction.  Currently complains of discomfort during the day but otherwise only presents with a bulge. Of greater importance today is a recent core biopsy obtained from the right breast lesion showing an atypical papillary lesion.  This was picked up on screening mammography.  She had no history of nipple discharge, palpable breast mass or other breast change.  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 updated 10/26/22      Past Surgical History:  Procedure Laterality Date   BREAST BIOPSY WITH RADIO FREQUENCY LOCALIZER Right 08/12/2022   Procedure: BREAST BIOPSY- SScientist, clinical (histocompatibility and immunogenetics)  Surgeon: RRonny Bacon MD;  Location: [facility];  Service: General;  Laterality: Right;   CATARACT EXTRACTION Bilateral    CHOLECYSTECTOMY     COLONOSCOPY     ERCP     POLYPECTOMY     UMBILICAL HERNIA REPAIR N/A 02/27/2021   Procedure: REPAIR INCARCERATED UMBILICAL HERNIA;  Surgeon: TGeorganna Skeans MD;  [place] [facility]  Service: General;  Laterality: N/A;    No Known Allergies  Current Outpatient Medications  Medication Sig Dispense Refill   amLODipine (NORVASC) 5 MG tablet Take 1 tablet (5 mg total) by mouth daily. 90 tablet 1   buPROPion (WELLBUTRIN XL) 150 MG 24 hr tablet TAKE 1 TABLET (150 MG TOTAL) BY MOUTH DAILY. FOR SMOKING CESSATION 90 tablet 1   CALCIUM CITRATE PO Take 1 tablet by mouth See admin instructions. Take 1 tablet by mouth every 3-4 days. (Patient not taking: Reported on 10/26/2022)     HYDROcodone-acetaminophen (NORCO/VICODIN) 5-325 MG tablet Take 1 tablet by mouth every 6 (six) hours as needed for moderate pain. (Patient not taking: Reported on 09/28/2022) 15 tablet 0   ibuprofen (ADVIL) 200 MG tablet Take 400 mg by mouth daily as needed for headache. (Patient not taking: Reported on 10/26/2022)     magnesium hydroxide (MILK OF MAGNESIA) 400 MG/5ML suspension Take by mouth daily as needed for mild constipation. (Patient not taking: Reported on 10/26/2022)     Misc. Devices MISC BP monitor (Patient not taking: Reported on 10/26/2022) 1 each 0   Psyllium (METAMUCIL PO) Take 16 oz by mouth See admin instructions. Take 16 oz by mouth every 2 days.     spironolactone (ALDACTONE) 50 MG tablet Take 1 tablet (50 mg total) by mouth daily. 90 tablet 1   No current facility-administered medications for this visit.    Family History Family History  Problem  Relation Age of Onset   Other Mother        Hit by a train while in her truck   Lung cancer Father    Liver disease Neg Hx    Colon cancer Neg Hx    Esophageal cancer Neg Hx    Pancreatic cancer Neg Hx    Stomach cancer Neg Hx    Colon polyps Neg Hx    Crohn's disease Neg Hx    Rectal cancer Neg Hx    Ulcerative colitis Neg Hx       Social History Social History   Tobacco Use   Smoking status: Every Day    Packs/day: 0.50    Years: 40.00    Total pack years: 20.00    Types: Cigarettes    Passive  exposure: Past   Smokeless tobacco: Never   Tobacco comments:    Quit smoking on 02/27/2021 when hernia repair surgery occurred.  Back to smoking .5 pack of cigarettes daily  Vaping Use   Vaping Use: Never used  Substance Use Topics   Alcohol use: Not Currently   Drug use: Not Currently    Types: "Crack" cocaine    Comment: quit 20 years ago updated 10/26/22        Review of Systems  Constitutional: Negative.   HENT: Negative.    Eyes: Negative.   Respiratory: Negative.    Cardiovascular:  Positive for leg swelling.  Gastrointestinal:  Positive for abdominal pain.  Genitourinary:  Positive for frequency.  Skin: Negative.   Neurological:  Positive for headaches.  Psychiatric/Behavioral: Negative.    All other systems reviewed and are negative.     Physical Exam There were no vitals taken for this visit.    CONSTITUTIONAL: Well developed, and nourished, appropriately responsive and aware without distress.   EYES: Sclera non-icteric.   EARS, NOSE, MOUTH AND THROAT:  The oropharynx is clear. Oral mucosa is pink and moist.   Hearing is intact to voice.  NECK: Trachea is midline, and there is no jugular venous distension.  LYMPH NODES:  Lymph nodes in the neck are not enlarged. RESPIRATORY:  Lungs are clear, and breath sounds are equal bilaterally. Normal respiratory effort without pathologic use of accessory muscles. CARDIOVASCULAR: Heart is regular in rate and rhythm. GI: The abdomen is soft, nontender, and nondistended.  There is a reducible midline periumbilical hernia, difficult to fully appreciate fascial defects present.  There were no palpable masses. I did not appreciate hepatosplenomegaly. There were normal bowel sounds.  No succussion splash of ascites.  No obvious stigmata of cirrhosis/portal hypertension. Caryl Lyn present as chaperone: Right breast examination, lateral incision is clean, dry and intact.  There is resolving ecchymosis present.  No induration, erythema  or fluctuance to suggest hematoma or infection. MUSCULOSKELETAL:  Symmetrical muscle tone appreciated in all four extremities.    SKIN: Skin turgor is normal. No pathologic skin lesions appreciated.  NEUROLOGIC:  Motor and sensation appear grossly normal.  Cranial nerves are grossly without defect. PSYCH:  Alert and oriented to person, place and time. Affect is appropriate for situation.  Data Reviewed I have personally reviewed what is currently available of the patient's imaging, recent labs and medical records.   Labs:     Latest Ref Rng & Units 06/07/2022    9:25 PM 05/26/2022   11:28 AM 03/13/2022    7:50 AM  CBC  WBC 4.0 - 10.5 K/uL 5.5  4.3  5.0   Hemoglobin 12.0 - 15.0 g/dL  12.4  12.5  11.8   Hematocrit 36.0 - 46.0 % 36.5  35.9  36.0   Platelets 150 - 400 K/uL 119  130  120       Latest Ref Rng & Units 09/28/2022    2:44 PM 06/23/2022   11:32 AM 06/07/2022    9:25 PM  CMP  Glucose 70 - 99 mg/dL 81   133   BUN 8 - 27 mg/dL 17   22   Creatinine 0.57 - 1.00 mg/dL 0.77   0.90   Sodium 134 - 144 mmol/L 142   136   Potassium 3.5 - 5.2 mmol/L 4.1   4.3   Chloride 96 - 106 mmol/L 105   106   CO2 20 - 29 mmol/L 23   24   Calcium 8.7 - 10.3 mg/dL 11.0  11.1  10.8   Total Protein 6.0 - 8.5 g/dL 7.3   7.2   Total Bilirubin 0.0 - 1.2 mg/dL 0.8   0.8   Alkaline Phos 44 - 121 IU/L 121   106   AST 0 - 40 IU/L 30   33   ALT 0 - 32 IU/L 11   21        CLINICAL DATA:  Acute abdominal pain   EXAM: CT ABDOMEN AND PELVIS WITH CONTRAST   TECHNIQUE: Multidetector CT imaging of the abdomen and pelvis was performed using the standard protocol following bolus administration of intravenous contrast.   RADIATION DOSE REDUCTION: This exam was performed according to the departmental dose-optimization program which includes automated exposure control, adjustment of the mA and/or kV according to patient size and/or use of iterative reconstruction technique.   CONTRAST:  168m OMNIPAQUE  IOHEXOL 300 MG/ML  SOLN   COMPARISON:  None Available.   FINDINGS: Lower Chest: Normal.   Hepatobiliary: Diffusely nodular hepatic contours, consistent with hepatic cirrhosis. No focal liver lesion. There is unchanged intrahepatic biliary dilatation. Unchanged cyst in the left hepatic lobe. Status post cholecystectomy.   Pancreas: Normal pancreas. No ductal dilatation or peripancreatic fluid collection.   Spleen: Normal.   Adrenals/Urinary Tract: The adrenal glands are normal. No hydronephrosis, nephroureterolithiasis or solid renal mass. The urinary bladder is normal for degree of distention   Stomach/Bowel: There is no hiatal hernia. Normal duodenal course and caliber. There is a ventral abdominal hernia that contains a loop of small bowel with mild adjacent fat stranding and a small amount of fluid in the hernia sac. No proximal dilatation. Slightly superiorly, there is a second fat containing ventral hernia. Rectosigmoid diverticulosis without acute inflammation. Normal appendix.   Vascular/Lymphatic: There is calcific atherosclerosis of the abdominal aorta. No lymphadenopathy.   Reproductive: Normal uterus. No adnexal mass.   Other: None.   Musculoskeletal: No bony spinal canal stenosis or focal osseous abnormality.   IMPRESSION: 1. Ventral abdominal hernia that contains a loop of small bowel with mild adjacent fat stranding and a small amount of fluid in the hernia sac. This may indicate early strangulation. 2. Hepatic cirrhosis. 3. Rectosigmoid diverticulosis without acute inflammation. 4. Aortic Atherosclerosis (ICD10-I70.0).     Electronically Signed   By: KUlyses JarredM.D.   On: 06/08/2022 01:38 CLINICAL DATA:  Cirrhosis, liver cysts, pancreatic cystic lesions   CLINICAL DATA:  Cirrhosis, follow-up liver lesions and pancreatic lesion   EXAM: MRI ABDOMEN WITHOUT AND WITH CONTRAST   TECHNIQUE: Multiplanar multisequence MR imaging of the abdomen  was performed both before and after the administration of intravenous contrast.   CONTRAST:  20m GADAVIST GADOBUTROL 1 MMOL/ML IV SOLN   COMPARISON:  MR abdomen, 04/08/2022   FINDINGS: Lower chest: No acute abnormality.   Hepatobiliary: Coarse, nodular cirrhotic morphology of the liver. Numerous unchanged hepatic parenchymal and peribiliary cysts (series 12, image 13). Occasional tiny foci of arterial hyperenhancement scattered throughout the liver are not significantly changed, for example in the posterior right lobe of the liver, hepatic segment VII measuring 0.6 cm (series 22, image 31) and in the anterior left lobe of the liver, hepatic segment II measuring 0.6 cm (series 22, image 21). No evidence of washout or capsular enhancement. Status post cholecystectomy. Unchanged postoperative biliary ductal dilatation.   Pancreas: Multiple pancreatic cystic lesions are not significantly changed, again including of the pancreatic head (series 12, image 20), the ventral pancreatic neck (series 12, image 17), and the largest lesion in the tip of the pancreatic tail measuring 2.0 x 1.5 cm (series 12, image 18). No pancreatic ductal dilatation or surrounding inflammatory changes.   Spleen: Splenomegaly, maximum coronal span 15.1 cm.   Adrenals/Urinary Tract: Adrenal glands are unremarkable. Simple, benign bilateral renal cortical cysts and subcentimeter lesions too small to characterize although almost certainly additional simple cysts, benign, for which no further follow-up or characterization is required. Kidneys are otherwise normal, without renal calculi, solid lesion, or hydronephrosis.   Stomach/Bowel: Stomach is within normal limits. Somewhat thickened, hyperenhancing appearance of the cecum and ascending colon, imaging limited on this examination of the abdomen (series 28, image 20).   Vascular/Lymphatic: Aortic atherosclerosis. No enlarged abdominal lymph nodes.   Other:  Small, fat containing midline epigastric hernia (series 29, image 76). No ascites.   Musculoskeletal: No acute or significant osseous findings.   IMPRESSION: 1. Cirrhosis. Occasional tiny foci of arterial hyperenhancement scattered throughout the liver are not significantly changed. No evidence of washout or capsular enhancement. These remain LI-RADS category 3, intermediate probability of malignancy. Recommend continued follow-up at 6 months. 2. Multiple pancreatic cystic lesions are not significantly changed, the largest lesion in the tip of the pancreatic tail measuring 2.0 x 1.5 cm. 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 Recommend follow-up in 6 months to ensure stability, which again can be performed in conjunction with above recommended LI-RADS surveillance. 3. Somewhat thickened, hyperenhancing appearance of the cecum and ascending colon, imaging limited on this examination of the abdomen. This most likely reflects portal colopathy in the setting of cirrhosis. 4. Splenomegaly.   Aortic Atherosclerosis (ICD10-I70.0).     Electronically Signed   By: ADelanna AhmadiM.D.   On: 09/28/2022 07:12 Assessment     Patient Active Problem List   Diagnosis Date Noted   Abnormal magnetic resonance cholangiopancreatography (MRCP) 08/27/2022   Papilloma of right breast 07/16/2022   Ventral hernia, recurrent 07/16/2022   Unilateral primary osteoarthritis, right knee 06/04/2022   Peripheral edema 05/12/2021   Chronic [place] C without hepatic coma ([place] 03/25/2021   Acid reflux 03/25/2021   Incarcerated umbilical hernia 062/13/0865  Small bowel obstruction ([place] 02/27/2021   Hepatic cirrhosis ([place] by CT scan 02/27/2021   AKI (acute kidney injury) ([facility] 02/27/2021    Plan     Patient needs follow-up imaging late Nov/Early Dec regarding hepatic lesions and pancreatic lesions and plan for MRI. We discussed proceeding with her hernia repair, however I feel it is  prudent to defer surgery for the MRI follow-up evaluation. I have also encouraged her to watch her diet, improve her health with some weight loss, and stressed smoking cessation for  the sake of her postoperative course.  I believe she understands this and still wants to get her hernia repaired. Risks reviewed and accepted.  All questions answered.  Face-to-face time spent with the patient and accompanying care providers(if present) was 45 minutes, with more than 50% of the time spent counseling, educating, and coordinating care of the patient.    These notes generated with voice recognition software. I apologize for typographical errors.  Ronny Bacon M.D., FACS 11/23/2022, 12:49 PM

## 2022-11-24 ENCOUNTER — Ambulatory Visit: Payer: Medicaid Other | Admitting: Surgery

## 2022-11-25 ENCOUNTER — Telehealth: Payer: Self-pay | Admitting: Gastroenterology

## 2022-11-25 NOTE — Telephone Encounter (Signed)
All questions answered- reviewed prep instructions with her

## 2022-11-25 NOTE — Telephone Encounter (Signed)
Patient called requesting to speak with a nurse regarding her prep instructions.

## 2022-11-26 ENCOUNTER — Ambulatory Visit: Payer: Medicaid Other | Admitting: Surgery

## 2022-11-26 ENCOUNTER — Encounter: Payer: Self-pay | Admitting: Surgery

## 2022-11-26 VITALS — BP 151/81 | HR 82 | Temp 98.1°F | Ht 61.0 in | Wt 196.2 lb

## 2022-11-26 DIAGNOSIS — K432 Incisional hernia without obstruction or gangrene: Secondary | ICD-10-CM | POA: Diagnosis not present

## 2022-11-26 DIAGNOSIS — R933 Abnormal findings on diagnostic imaging of other parts of digestive tract: Secondary | ICD-10-CM | POA: Diagnosis not present

## 2022-11-26 DIAGNOSIS — K7469 Other cirrhosis of liver: Secondary | ICD-10-CM

## 2022-11-26 DIAGNOSIS — K862 Cyst of pancreas: Secondary | ICD-10-CM

## 2022-11-26 DIAGNOSIS — Z7689 Persons encountering health services in other specified circumstances: Secondary | ICD-10-CM | POA: Diagnosis not present

## 2022-11-26 NOTE — Patient Instructions (Addendum)
If you have any concerns or questions, please feel free to call our office. See follow up appointment below.   Ventral Hernia  A ventral hernia is a bulge of tissue from inside the abdomen that pushes through a weak area of the muscles that form the front wall of the abdomen. The tissues inside the abdomen are inside a sac (peritoneum). These tissues include the small intestine, large intestine, and the fatty tissue that covers the intestines (omentum). Sometimes, the bulge that forms a hernia contains intestines. Other hernias contain only fat. Ventral hernias do not go away without surgical treatment. There are several types of ventral hernias. You may have: A hernia at an incision site from previous abdominal surgery (incisional hernia). A hernia just above the belly button (epigastric hernia), or at the belly button (umbilical hernia). These types of hernias can develop from heavy lifting or straining. A hernia that comes and goes (reducible hernia). It may be visible only when you lift or strain. This type of hernia can be pushed back into the abdomen (reduced). A hernia that traps abdominal tissue inside the hernia (incarcerated hernia). This type of hernia does not reduce. A hernia that cuts off blood flow to the tissues inside the hernia (strangulated hernia). The tissues can start to die if this happens. This is a very painful bulge that cannot be reduced. A strangulated hernia is a medical emergency. What are the causes? This condition is caused by abdominal tissue putting pressure on an area of weakness in the abdominal muscles. What increases the risk? The following factors may make you more likely to develop this condition: Being age 48 or older. Being overweight or obese. Having had previous abdominal surgery, especially if there was an infection after surgery. Having had an injury to the abdominal wall. Frequently lifting or pushing heavy objects. Having had several  pregnancies. Having a buildup of fluid inside the abdomen (ascites). Straining to have a bowel movement or to urinate. Having frequent coughing episodes. What are the signs or symptoms? The only symptom of a ventral hernia may be a painless bulge in the abdomen. A reducible hernia may be visible only when you strain, cough, or lift. Other symptoms may include: Dull pain. A feeling of pressure. Signs and symptoms of a strangulated hernia may include: Increasing pain. Nausea and vomiting. Pain when pressing on the hernia. The skin over the hernia turning red or purple. Constipation. Blood in the stool (feces). How is this diagnosed? This condition may be diagnosed based on: Your symptoms. Your medical history. A physical exam. You may be asked to cough or strain while standing. These actions increase the pressure inside your abdomen and force the hernia through the opening in your muscles. Your health care provider may try to reduce the hernia by gently pushing the hernia back in. Imaging studies, such as an ultrasound or CT scan. How is this treated? This condition is treated with surgery. If you have a strangulated hernia, surgery is done as soon as possible. If your hernia is small and not incarcerated, you may be asked to lose some weight before surgery. Follow these instructions at home: Follow instructions from your health care provider about eating or drinking restrictions. If you are overweight, your health care provider may recommend that you increase your activity level and eat a healthier diet. Do not lift anything that is heavier than 10 lb (4.5 kg), or the limit that you are told, until your health care provider says that it  is safe. Return to your normal activities as told by your health care provider. Ask your health care provider what activities are safe for you. You may need to avoid activities that increase pressure on your hernia. Take over-the-counter and prescription  medicines only as told by your health care provider. Keep all follow-up visits. This is important. Contact a health care provider if: Your hernia gets larger. Your hernia becomes painful. Get help right away if: Your hernia becomes increasingly painful. You have pain along with any of the following: Changes in skin color in the area of the hernia. Nausea. Vomiting. Fever. These symptoms may represent a serious problem that is an emergency. Do not wait to see if the symptoms will go away. Get medical help right away. Call your local emergency services (911 in the [place] Do not drive yourself to the hospital. Summary A ventral hernia is a bulge of tissue from inside the abdomen that pushes through a weak area of the muscles that form the front wall of the abdomen. This condition is treated with surgery, which may be urgent depending on your hernia. Do not lift anything that is heavier than 10 lb (4.5 kg), and follow activity instructions from your health care provider. This information is not intended to replace advice given to you by your health care provider. Make sure you discuss any questions you have with your health care provider. Document Revised: 06/21/2020 Document Reviewed: 06/21/2020 Elsevier Patient Education  [place].

## 2022-11-26 NOTE — Progress Notes (Signed)
Patient ID: Samantha Evans, female   DOB: [birth year], 62 y.o.   MRN: [id number]  Chief Complaint: Presents for follow-up known recurrent ventral hernia.   History of Present Illness  Previously: Samantha Evans is a 63 y.o. female with the above.  Last CT scan was July 2023 confirming the umbilical periumbilical hernia defect.  Prior hernia repair in April 2022.  Reportedly no mesh was utilized at that time as she presented with bowel obstruction.  Currently complains of discomfort during the day but otherwise only presents with a bulge.  She has been able to reduce it, seems to be exacerbated by lifting firewood as they heat there home with wood. She has not lost any weight since our last evaluation, she does have a colonoscopy scheduled.  She did have her follow-up MRI as planned.  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 updated 10/26/22      Past Surgical History:  Procedure Laterality Date   BREAST BIOPSY WITH RADIO FREQUENCY LOCALIZER Right 08/12/2022   Procedure: BREAST BIOPSY- Scientist, clinical (histocompatibility and immunogenetics);  Surgeon: Ronny Bacon, MD;  Location: [facility];  Service: General;  Laterality: Right;   CATARACT EXTRACTION Bilateral    CHOLECYSTECTOMY     COLONOSCOPY     ERCP     POLYPECTOMY     UMBILICAL HERNIA REPAIR N/A 02/27/2021   Procedure: REPAIR INCARCERATED UMBILICAL HERNIA;  Surgeon: Georganna Skeans, MD;  Location: [place];  Service: General;  Laterality: N/A;    No Known Allergies  Current Outpatient Medications  Medication Sig Dispense Refill   amLODipine (NORVASC) 5 MG tablet Take 1 tablet (5 mg total) by mouth daily. 90 tablet 1   buPROPion (WELLBUTRIN XL) 150 MG 24 hr tablet TAKE 1 TABLET (150 MG TOTAL) BY MOUTH DAILY. FOR SMOKING CESSATION 90  tablet 1   CALCIUM CITRATE PO Take 1 tablet by mouth See admin instructions. Take 1 tablet by mouth every 3-4 days.     HYDROcodone-acetaminophen (NORCO/VICODIN) 5-325 MG tablet Take 1 tablet by mouth every 6 (six) hours as needed for moderate pain. 15 tablet 0   ibuprofen (ADVIL) 200 MG tablet Take 400 mg by mouth daily as needed for headache.     magnesium hydroxide (MILK OF MAGNESIA) 400 MG/5ML suspension Take by mouth daily as needed for mild constipation.     Misc. Devices MISC BP monitor 1 each 0   Psyllium (METAMUCIL PO) Take 16 oz by mouth See admin instructions. Take 16 oz by mouth every 2 days.     spironolactone (ALDACTONE) 50 MG tablet Take 1 tablet (50 mg total) by mouth daily. 90 tablet 1   No current facility-administered medications for this visit.    Family History Family History  Problem Relation Age of Onset   Other Mother        Hit by a train while in her truck   Lung cancer Father    Liver disease Neg Hx    Colon cancer Neg Hx    Esophageal cancer Neg Hx    Pancreatic cancer Neg Hx    Stomach cancer Neg Hx    Colon polyps Neg Hx    Crohn's disease Neg Hx    Rectal  cancer Neg Hx    Ulcerative colitis Neg Hx       Social History Social History   Tobacco Use   Smoking status: Every Day    Packs/day: 0.50    Years: 40.00    Total pack years: 20.00    Types: Cigarettes    Passive exposure: Past   Smokeless tobacco: Never   Tobacco comments:    Quit smoking on 02/27/2021 when hernia repair surgery occurred.  Back to smoking .5 pack of cigarettes daily  Vaping Use   Vaping Use: Never used  Substance Use Topics   Alcohol use: Not Currently   Drug use: Not Currently    Types: "Crack" cocaine    Comment: quit 20 years ago updated 10/26/22        Review of Systems  Constitutional: Negative.   HENT: Negative.    Eyes: Negative.   Respiratory: Negative.    Cardiovascular:  Positive for leg swelling.  Gastrointestinal:  Positive for abdominal pain.   Genitourinary:  Positive for frequency.  Skin: Negative.   Neurological:  Positive for headaches.  Psychiatric/Behavioral: Negative.    All other systems reviewed and are negative.     Physical Exam Blood pressure (!) 151/81, pulse 82, temperature 98.1 F (36.7 C), temperature source Oral, height '5\' 1"'$  (1.549 m), weight 196 lb 3.2 oz (89 kg), SpO2 94 %. Last Weight  Most recent update: 11/26/2022 10:32 AM    Weight  89 kg (196 lb 3.2 oz)             CONSTITUTIONAL: Well developed, and nourished, appropriately responsive and aware without distress.   EYES: Sclera non-icteric.   EARS, NOSE, MOUTH AND THROAT:  The oropharynx is clear. Oral mucosa is pink and moist.   Hearing is intact to voice.  NECK: Trachea is midline, and there is no jugular venous distension.  LYMPH NODES:  Lymph nodes in the neck are not enlarged. RESPIRATORY:  Lungs are clear, and breath sounds are equal bilaterally. Normal respiratory effort without pathologic use of accessory muscles. CARDIOVASCULAR: Heart is regular in rate and rhythm. GI: The abdomen is soft, nontender, and nondistended.  There is a reducible midline periumbilical hernia, difficult to fully appreciate fascial defects present.  There were no palpable masses. I did not appreciate hepatosplenomegaly. There were normal bowel sounds.  No succussion splash of ascites.  No obvious stigmata of cirrhosis/portal hypertension. Caryl Lyn present as chaperone: Right breast examination, lateral incision is clean, dry and intact.  There is resolving ecchymosis present.  No induration, erythema or fluctuance to suggest hematoma or infection. MUSCULOSKELETAL:  Symmetrical muscle tone appreciated in all four extremities.    SKIN: Skin turgor is normal. No pathologic skin lesions appreciated.  NEUROLOGIC:  Motor and sensation appear grossly normal.  Cranial nerves are grossly without defect. PSYCH:  Alert and oriented to person, place and time. Affect is  appropriate for situation.  Data Reviewed I have personally reviewed what is currently available of the patient's imaging, recent labs and medical records.   Labs:     Latest Ref Rng & Units 06/07/2022    9:25 PM 05/26/2022   11:28 AM 03/13/2022    7:50 AM  CBC  WBC 4.0 - 10.5 K/uL 5.5  4.3  5.0   Hemoglobin 12.0 - 15.0 g/dL 12.4  12.5  11.8   Hematocrit 36.0 - 46.0 % 36.5  35.9  36.0   Platelets 150 - 400 K/uL 119  130  120  Latest Ref Rng & Units 09/28/2022    2:44 PM 06/23/2022   11:32 AM 06/07/2022    9:25 PM  CMP  Glucose 70 - 99 mg/dL 81   133   BUN 8 - 27 mg/dL 17   22   Creatinine 0.57 - 1.00 mg/dL 0.77   0.90   Sodium 134 - 144 mmol/L 142   136   Potassium 3.5 - 5.2 mmol/L 4.1   4.3   Chloride 96 - 106 mmol/L 105   106   CO2 20 - 29 mmol/L 23   24   Calcium 8.7 - 10.3 mg/dL 11.0  11.1  10.8   Total Protein 6.0 - 8.5 g/dL 7.3   7.2   Total Bilirubin 0.0 - 1.2 mg/dL 0.8   0.8   Alkaline Phos 44 - 121 IU/L 121   106   AST 0 - 40 IU/L 30   33   ALT 0 - 32 IU/L 11   21        CLINICAL DATA:  Acute abdominal pain   EXAM: CT ABDOMEN AND PELVIS WITH CONTRAST   TECHNIQUE: Multidetector CT imaging of the abdomen and pelvis was performed using the standard protocol following bolus administration of intravenous contrast.   RADIATION DOSE REDUCTION: This exam was performed according to the departmental dose-optimization program which includes automated exposure control, adjustment of the mA and/or kV according to patient size and/or use of iterative reconstruction technique.   CONTRAST:  127m OMNIPAQUE IOHEXOL 300 MG/ML  SOLN   COMPARISON:  None Available.   FINDINGS: Lower Chest: Normal.   Hepatobiliary: Diffusely nodular hepatic contours, consistent with hepatic cirrhosis. No focal liver lesion. There is unchanged intrahepatic biliary dilatation. Unchanged cyst in the left hepatic lobe. Status post cholecystectomy.   Pancreas: Normal pancreas. No  ductal dilatation or peripancreatic fluid collection.   Spleen: Normal.   Adrenals/Urinary Tract: The adrenal glands are normal. No hydronephrosis, nephroureterolithiasis or solid renal mass. The urinary bladder is normal for degree of distention   Stomach/Bowel: There is no hiatal hernia. Normal duodenal course and caliber. There is a ventral abdominal hernia that contains a loop of small bowel with mild adjacent fat stranding and a small amount of fluid in the hernia sac. No proximal dilatation. Slightly superiorly, there is a second fat containing ventral hernia. Rectosigmoid diverticulosis without acute inflammation. Normal appendix.   Vascular/Lymphatic: There is calcific atherosclerosis of the abdominal aorta. No lymphadenopathy.   Reproductive: Normal uterus. No adnexal mass.   Other: None.   Musculoskeletal: No bony spinal canal stenosis or focal osseous abnormality.   IMPRESSION: 1. Ventral abdominal hernia that contains a loop of small bowel with mild adjacent fat stranding and a small amount of fluid in the hernia sac. This may indicate early strangulation. 2. Hepatic cirrhosis. 3. Rectosigmoid diverticulosis without acute inflammation. 4. Aortic Atherosclerosis (ICD10-I70.0).     Electronically Signed   By: KUlyses JarredM.D.   On: 06/08/2022 01:38   CLINICAL DATA:  Cirrhosis, follow-up liver lesions and pancreatic lesion   EXAM: MRI ABDOMEN WITHOUT AND WITH CONTRAST   TECHNIQUE: Multiplanar multisequence MR imaging of the abdomen was performed both before and after the administration of intravenous contrast.   CONTRAST:  979mGADAVIST GADOBUTROL 1 MMOL/ML IV SOLN   COMPARISON:  MR abdomen, 04/08/2022   FINDINGS: Lower chest: No acute abnormality.   Hepatobiliary: Coarse, nodular cirrhotic morphology of the liver. Numerous unchanged hepatic parenchymal and peribiliary cysts (series 12, image 13). Occasional  tiny foci of arterial  hyperenhancement scattered throughout the liver are not significantly changed, for example in the posterior right lobe of the liver, hepatic segment VII measuring 0.6 cm (series 22, image 31) and in the anterior left lobe of the liver, hepatic segment II measuring 0.6 cm (series 22, image 21). No evidence of washout or capsular enhancement. Status post cholecystectomy. Unchanged postoperative biliary ductal dilatation.   Pancreas: Multiple pancreatic cystic lesions are not significantly changed, again including of the pancreatic head (series 12, image 20), the ventral pancreatic neck (series 12, image 17), and the largest lesion in the tip of the pancreatic tail measuring 2.0 x 1.5 cm (series 12, image 18). No pancreatic ductal dilatation or surrounding inflammatory changes.   Spleen: Splenomegaly, maximum coronal span 15.1 cm.   Adrenals/Urinary Tract: Adrenal glands are unremarkable. Simple, benign bilateral renal cortical cysts and subcentimeter lesions too small to characterize although almost certainly additional simple cysts, benign, for which no further follow-up or characterization is required. Kidneys are otherwise normal, without renal calculi, solid lesion, or hydronephrosis.   Stomach/Bowel: Stomach is within normal limits. Somewhat thickened, hyperenhancing appearance of the cecum and ascending colon, imaging limited on this examination of the abdomen (series 28, image 20).   Vascular/Lymphatic: Aortic atherosclerosis. No enlarged abdominal lymph nodes.   Other: Small, fat containing midline epigastric hernia (series 29, image 76). No ascites.   Musculoskeletal: No acute or significant osseous findings.   IMPRESSION: 1. Cirrhosis. Occasional tiny foci of arterial hyperenhancement scattered throughout the liver are not significantly changed. No evidence of washout or capsular enhancement. These remain LI-RADS category 3, intermediate probability of malignancy.  Recommend continued follow-up at 6 months. 2. Multiple pancreatic cystic lesions are not significantly changed, the largest lesion in the tip of the pancreatic tail measuring 2.0 x 1.5 cm. 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 Recommend follow-up in 6 months to ensure stability, which again can be performed in conjunction with above recommended LI-RADS surveillance. 3. Somewhat thickened, hyperenhancing appearance of the cecum and ascending colon, imaging limited on this examination of the abdomen. This most likely reflects portal colopathy in the setting of cirrhosis. 4. Splenomegaly.   Aortic Atherosclerosis (ICD10-I70.0).     Electronically Signed   By: Delanna Ahmadi M.D.   On: 09/28/2022 07:12  Assessment    We have agreed to defer hernia repair now, with an effort to pursue weight loss, and improve her health. It is noted that she is on LI-RADS protocol with repeat MRI planned in 6 months which would put [place] in May.   Patient Active Problem List   Diagnosis Date Noted   Abnormal magnetic resonance cholangiopancreatography (MRCP) 08/27/2022   Papilloma of right breast 07/16/2022   Ventral hernia, recurrent 07/16/2022   Unilateral primary osteoarthritis, right knee 06/04/2022   Peripheral edema 05/12/2021   Chronic hepatitis C without hepatic coma ([place]) 03/25/2021   Acid reflux 03/25/2021   Incarcerated umbilical hernia 60/45/4098   Small bowel obstruction ([place]) 02/27/2021   Hepatic cirrhosis ([place])- by CT scan 02/27/2021   AKI (acute kidney injury) ([place]) 02/27/2021    Plan    Patient needs follow-up imaging for May regarding hepatic lesions and pancreatic lesions and plan for MRI. We discussed proceeding with her hernia repair, however I feel it is prudent to defer surgery for the MRI follow-up evaluation. I have also encouraged her to watch her diet, improve her health with some weight loss, and stressed smoking cessation for the sake of  her  postoperative course.  I believe she understands this and still wants to get her hernia repaired, eventually. Risks reviewed and accepted.  All questions answered.  Face-to-face time spent with the patient and accompanying care providers(if present) was 30 minutes, with more than 50% of the time spent counseling, educating, and coordinating care of the patient.    These notes generated with voice recognition software. I apologize for typographical errors.  Ronny Bacon M.D., FACS 11/26/2022, 10:57 AM

## 2022-12-04 ENCOUNTER — Encounter: Payer: Self-pay | Admitting: Gastroenterology

## 2022-12-04 ENCOUNTER — Ambulatory Visit (AMBULATORY_SURGERY_CENTER): Payer: Medicaid Other | Admitting: Gastroenterology

## 2022-12-04 ENCOUNTER — Telehealth: Payer: Self-pay

## 2022-12-04 VITALS — BP 134/67 | HR 73 | Temp 99.6°F | Resp 22 | Ht 61.0 in | Wt 200.0 lb

## 2022-12-04 DIAGNOSIS — Z09 Encounter for follow-up examination after completed treatment for conditions other than malignant neoplasm: Secondary | ICD-10-CM | POA: Diagnosis not present

## 2022-12-04 DIAGNOSIS — Z86010 Personal history of colonic polyps: Secondary | ICD-10-CM | POA: Diagnosis not present

## 2022-12-04 DIAGNOSIS — Z538 Procedure and treatment not carried out for other reasons: Secondary | ICD-10-CM | POA: Diagnosis not present

## 2022-12-04 DIAGNOSIS — I10 Essential (primary) hypertension: Secondary | ICD-10-CM | POA: Diagnosis not present

## 2022-12-04 DIAGNOSIS — Z1211 Encounter for screening for malignant neoplasm of colon: Secondary | ICD-10-CM | POA: Diagnosis not present

## 2022-12-04 MED ORDER — SODIUM CHLORIDE 0.9 % IV SOLN: 500.0000 mL | Freq: Once | INTRAVENOUS | Status: DC

## 2022-12-04 NOTE — Telephone Encounter (Signed)
-----  Message from [name], MD sent at 12/04/2022  2:19 PM EST ----- This patient is nearly a year overdue follow-up.  Please  arrange for an office visit with me next available.  Thank you.  KLB

## 2022-12-04 NOTE — Telephone Encounter (Signed)
OV scheduled for 12/31/22 at 2:10 pm with [name]. Pt notified via mychart.

## 2022-12-04 NOTE — Progress Notes (Signed)
Referring Provider: [name], MD Primary Care Physician:  [name], MD  Indication for Procedure:  Colon cancer Surveillance   IMPRESSION:  Need for colon cancer surveillance Appropriate candidate for monitored anesthesia care  PLAN: Colonoscopy in the [place] today   HPI: Samantha Evans is a 62 y.o. female presents for surveillance colonoscopy.  Prior endoscopic history: - Screening colonoscopy 7/27/ 22: One 15 mm polyp in the sigmoid colon with high grade dysplasia and unclear margins. One 3 mm tubular adenoma in the sigmoid colon. 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 - Flexible sigmoidoscopy 07/18/21: Tubular adenoma at 40 cm, inflammatory polyp at 28 cm, detached necroinflammatory debris at 16 cm, surveillance recommended in 1 year given concerns for residual polyp   CBC 06/07/22 was normal except for platelets of 111 CMP normal 09/28/2022 except for calcium of 11  No known family history of colon cancer or polyps. No family history of uterine/endometrial cancer, pancreatic cancer or gastric/stomach cancer.   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 updated 10/26/22    Past Surgical History:  Procedure Laterality Date   BREAST BIOPSY WITH RADIO FREQUENCY LOCALIZER Right 08/12/2022   Procedure: BREAST BIOPSY- Scientist, clinical (histocompatibility and immunogenetics);  Surgeon: Ronny Bacon, MD;  Location: [facility];  Service: General;  Laterality: Right;   CATARACT EXTRACTION Bilateral    CHOLECYSTECTOMY     COLONOSCOPY     ERCP     POLYPECTOMY     UMBILICAL HERNIA REPAIR N/A 02/27/2021   Procedure: REPAIR INCARCERATED UMBILICAL HERNIA;  Surgeon: Georganna Skeans, MD;  Location: [place];  Service: General;  Laterality: N/A;    Current  Outpatient Medications  Medication Sig Dispense Refill   amLODipine (NORVASC) 5 MG tablet Take 1 tablet (5 mg total) by mouth daily. 90 tablet 1   buPROPion (WELLBUTRIN XL) 150 MG 24 hr tablet TAKE 1 TABLET (150 MG TOTAL) BY MOUTH DAILY. FOR SMOKING CESSATION 90 tablet 1   CALCIUM CITRATE PO Take 1 tablet by mouth See admin instructions. Take 1 tablet by mouth every 3-4 days.     HYDROcodone-acetaminophen (NORCO/VICODIN) 5-325 MG tablet Take 1 tablet by mouth every 6 (six) hours as needed for moderate pain. 15 tablet 0   ibuprofen (ADVIL) 200 MG tablet Take 400 mg by mouth daily as needed for headache.     magnesium hydroxide (MILK OF MAGNESIA) 400 MG/5ML suspension Take by mouth daily as needed for mild constipation.     Misc. Devices MISC BP monitor 1 each 0   Psyllium (METAMUCIL PO) Take 16 oz by mouth See admin instructions. Take 16 oz by mouth every 2 days.     spironolactone (ALDACTONE) 50 MG tablet Take 1 tablet (50 mg total) by mouth daily. 90 tablet 1   No current facility-administered medications for this visit.    Allergies as of 12/04/2022   (No Known Allergies)    Family History  Problem Relation Age of Onset   Other Mother        Hit by a train while in her truck   Lung cancer Father    Liver disease Neg Hx    Colon cancer Neg Hx    Esophageal cancer  Neg Hx    Pancreatic cancer Neg Hx    Stomach cancer Neg Hx    Colon polyps Neg Hx    Crohn's disease Neg Hx    Rectal cancer Neg Hx    Ulcerative colitis Neg Hx      Physical Exam: General:   Alert,  well-nourished, pleasant and cooperative in NAD Head:  Normocephalic and atraumatic. Eyes:  Sclera clear, no icterus.   Conjunctiva pink. Mouth:  No deformity or lesions.   Neck:  Supple; no masses or thyromegaly. Lungs:  Clear throughout to auscultation.   No wheezes. Heart:  Regular rate and rhythm; no murmurs. Abdomen:  Soft, non-tender, nondistended, normal bowel sounds, no rebound or guarding.  Msk:   Symmetrical. No boney deformities LAD: No inguinal or umbilical LAD Extremities:  No clubbing or edema. Neurologic:  Alert and  oriented x4;  grossly nonfocal Skin:  No obvious rash or bruise. Psych:  Alert and cooperative. Normal mood and affect.     Studies/Results: No results found.    Chau Savell L. Tarri Glenn, MD, MPH 12/04/2022, 2:17 PM

## 2022-12-04 NOTE — Progress Notes (Signed)
Pt's states no medical or surgical changes since previsit or office visit. 

## 2022-12-04 NOTE — Patient Instructions (Signed)
Please read handouts provided. Continue present medications. Please see [name] in clinic. Repeat colonoscopy.  YOU HAD AN ENDOSCOPIC PROCEDURE TODAY AT [facility]:   Refer to the procedure report that was given to you for any specific questions about what was found during the examination.  If the procedure report does not answer your questions, please call your gastroenterologist to clarify.  If you requested that your care partner not be given the details of your procedure findings, then the procedure report has been included in a sealed envelope for you to review at your convenience later.  YOU SHOULD EXPECT: Some feelings of bloating in the abdomen. Passage of more gas than usual.  Walking can help get rid of the air that was put into your GI tract during the procedure and reduce the bloating. If you had a lower endoscopy (such as a colonoscopy or flexible sigmoidoscopy) you may notice spotting of blood in your stool or on the toilet paper. If you underwent a bowel prep for your procedure, you may not have a normal bowel movement for a few days.  Please Note:  You might notice some irritation and congestion in your nose or some drainage.  This is from the oxygen used during your procedure.  There is no need for concern and it should clear up in a day or so.  SYMPTOMS TO REPORT IMMEDIATELY:  Following lower endoscopy (colonoscopy or flexible sigmoidoscopy):  Excessive amounts of blood in the stool  Significant tenderness or worsening of abdominal pains  Swelling of the abdomen that is new, acute  Fever of 100F or higher  For urgent or emergent issues, a gastroenterologist can be reached at any hour by calling [phone]. Do not use MyChart messaging for urgent concerns.    DIET:  We do recommend a small meal at first, but then you may proceed to your regular diet.  Drink plenty of fluids but you should avoid alcoholic beverages for 24 hours.  ACTIVITY:  You  should plan to take it easy for the rest of today and you should NOT DRIVE or use heavy machinery until tomorrow (because of the sedation medicines used during the test).    FOLLOW UP: Our staff will call the number listed on your records the next business day following your procedure.  We will call around 7:15- 8:00 am to check on you and address any questions or concerns that you may have regarding the information given to you following your procedure. If we do not reach you, we will leave a message.     If any biopsies were taken you will be contacted by phone or by letter within the next 1-3 weeks.  Please call us at [phone] if you have not heard about the biopsies in 3 weeks.    SIGNATURES/CONFIDENTIALITY: You and/or your care partner have signed paperwork which will be entered into your electronic medical record.  These signatures attest to the fact that that the information above on your After Visit Summary has been reviewed and is understood.  Full responsibility of the confidentiality of this discharge information lies with you and/or your care-partner.

## 2022-12-04 NOTE — Op Note (Signed)
[facility] Patient Name: Samantha Evans Procedure Date: 12/04/2022 3:00 PM MRN: [id number] Endoscopist: [name] MD, MD, [id number] Age: 62 Referring MD:  Date of Birth: [birth year] Gender: Female Account #: [id number] Procedure:                Colonoscopy Indications:              High risk colon cancer surveillance: Personal                            history of adenoma (10 mm or greater in size)                           - Screening colonoscopy 7/27/ 22: One 15 mm polyp                            in the sigmoid colon with high grade dysplasia and                            unclear margins. One 3 mm tubular adenoma in the                            sigmoid colon. 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                           - Flexible sigmoidoscopy 07/18/21: Tubular adenoma at                            40 cm, inflammatory polyp at 28 cm, detached                            necroinflammatory debris at 16 cm, surveillance                            recommended in 1 year given concerns for residual                            polyp Medicines:                Monitored Anesthesia Care Procedure:                Pre-Anesthesia Assessment:                           - Prior to the procedure, a History and Physical                            was performed, and patient medications and                            allergies were reviewed. The patient's tolerance of                            previous anesthesia was also reviewed. The risks  and benefits of the procedure and the sedation                            options and risks were discussed with the patient.                            All questions were answered, and informed consent                            was obtained. Prior Anticoagulants: The patient has                            taken no anticoagulant or antiplatelet agents. ASA                            Grade Assessment: III - A patient with  severe                            systemic disease. After reviewing the risks and                            benefits, the patient was deemed in satisfactory                            condition to undergo the procedure.                           After obtaining informed consent, the colonoscope                            was passed under direct vision. Throughout the                            procedure, the patient's blood pressure, pulse, and                            oxygen saturations were monitored continuously. The                            CF [id number] #[id number] was introduced through the anus                            with the intention of advancing to the ileum. The                            scope was advanced to the descending colon before                            the procedure was aborted. Medications were given.                            The colonoscopy was extremely difficult due to  inadequate bowel prep. The patient tolerated the                            procedure well. The quality of the bowel                            preparation was poor. The rectum was photographed. Scope In: 3:19:25 PM Scope Out: 3:24:31 PM Total Procedure Duration: 0 hours 5 minutes 6 seconds  Findings:                 The perianal and digital rectal examinations were                            normal.                           A large amount of liquid semi-liquid stool was                            found in the entire colon, interfering with                            visualization. Small, medium, large, and flat                            polyps would be missed during the evaluation due to                            retained stool. Complications:            No immediate complications. Estimated Blood Loss:     Estimated blood loss: none. Impression:               - Preparation of the colon was poor.                           - Stool in the entire examined colon.                            - No specimens collected. Recommendation:           - Patient has a contact number available for                            emergencies. The signs and symptoms of potential                            delayed complications were discussed with the                            patient. Return to normal activities tomorrow.                            Written discharge instructions were provided to the  patient.                           - Resume previous diet.                           - Continue present medications.                           - Repeat colonoscopy at the next available                            appointment with a 2 day bowel prep because the                            bowel preparation was poor.                           - Office follow-up also recommended. [name] MD, MD 12/04/2022 3:31:24 PM This report has been signed electronically.

## 2022-12-04 NOTE — Progress Notes (Signed)
To pacu, VSS. Report to [email or web address] 

## 2022-12-07 ENCOUNTER — Telehealth: Payer: Self-pay

## 2022-12-07 NOTE — Telephone Encounter (Signed)
  Follow up Call-     12/04/2022    2:30 PM 07/23/2021   10:48 AM 07/18/2021    2:55 PM 06/11/2021    7:11 AM 05/20/2021   10:41 AM  Call back number  Post procedure Call Back phone  # [phone]  Permission to leave phone message Yes Yes Yes Yes Yes    Post op call attempted, no answer, no VM

## 2022-12-15 ENCOUNTER — Encounter: Payer: Medicaid Other | Admitting: Gastroenterology

## 2022-12-17 DIAGNOSIS — Z419 Encounter for procedure for purposes other than remedying health state, unspecified: Secondary | ICD-10-CM | POA: Diagnosis not present

## 2022-12-31 ENCOUNTER — Ambulatory Visit: Payer: Medicaid Other | Admitting: Gastroenterology

## 2023-01-01 ENCOUNTER — Telehealth: Payer: Self-pay | Admitting: Physician Assistant

## 2023-01-01 NOTE — Telephone Encounter (Signed)
Patient last seen Samantha Evans on 07/2022 and was supposed to get MRI it was never approved by insurance so it was cancelled notes say she was contacted but VMB was full patient never got MyChart notification she is stating her knee is still hurting all the same and would like to get another MRI ordered being she has not been seen in over 5 months I advised her she will most likely have to set an appt, please advise on if MRI can be resent or if she will need appt

## 2023-01-04 NOTE — Telephone Encounter (Signed)
I called patient and advised. Appointment scheduled with Bevely Palmer on 01/11/2023 at 1:45p.

## 2023-01-06 ENCOUNTER — Other Ambulatory Visit: Payer: Self-pay

## 2023-01-06 DIAGNOSIS — D241 Benign neoplasm of right breast: Secondary | ICD-10-CM

## 2023-01-11 ENCOUNTER — Ambulatory Visit: Payer: Medicaid Other | Admitting: Physician Assistant

## 2023-01-11 ENCOUNTER — Encounter: Payer: Self-pay | Admitting: Physician Assistant

## 2023-01-11 VITALS — Ht 61.0 in | Wt 200.0 lb

## 2023-01-11 DIAGNOSIS — M25561 Pain in right knee: Secondary | ICD-10-CM | POA: Diagnosis not present

## 2023-01-11 DIAGNOSIS — Z7689 Persons encountering health services in other specified circumstances: Secondary | ICD-10-CM | POA: Diagnosis not present

## 2023-01-11 NOTE — Progress Notes (Signed)
Office Visit Note   Patient: Samantha Evans           Date of Birth: 05/04/1961           MRN: [id number] Visit Date: 01/11/2023              Requested by: [name], MD [place],  [place] [address] PCP: [name], MD  Chief Complaint  Patient presents with   Right Knee - Pain      HPI: Samantha Evans is a pleasant 62 year old woman who is a patient former of Dr. Rudene Anda.  She was last seen 7 months ago complaining of right knee pain.  She had mechanical symptoms and focal lateral pain.  An injection and regular x-rays were done.  The cortisone injection did not help her at all even temporarily.  She has also tried anti-inflammatories change in activity.  Her symptoms have progressed over the lateral joint line.  She has catching and locking in her knee gives out on her.  She also has difficulty with full extension and full flexion  Assessment & Plan: Visit Diagnoses: Right knee pain.  Plan: Dr. Durward Fortes did want her to get an MRI last August but it was denied for lack of time and conservative treatment.  Considering the progression of her symptoms and mechanical symptoms as well as no improvement with an injection I recommended an MRI  Follow-Up Instructions: No follow-ups on file.   Ortho Exam  Patient is alert, oriented, no adenopathy, well-dressed, normal affect, normal respiratory effort. Right knee she has no erythema no redness no effusion.  She is focally tender over the lateral joint line.  She has pain with terminal extension and flexion directly over the lateral joint line.  Compartments are soft and nontender she is neurovascular intact  Imaging: No results found. No images are attached to the encounter.  Labs: Lab Results  Component Value Date   HGBA1C 4.8 05/26/2022     Lab Results  Component Value Date   ALBUMIN 4.1 09/28/2022   ALBUMIN 3.4 (L) 06/07/2022   ALBUMIN 4.2 05/26/2022    No results found for: "MG" No  results found for: "VD25OH"  No results found for: "PREALBUMIN"    Latest Ref Rng & Units 06/07/2022    9:25 PM 05/26/2022   11:28 AM 03/13/2022    7:50 AM  CBC EXTENDED  WBC 4.0 - 10.5 K/uL 5.5  4.3  5.0   RBC 3.87 - 5.11 MIL/uL 4.20  4.24  4.14   Hemoglobin 12.0 - 15.0 g/dL 12.4  12.5  11.8   HCT 36.0 - 46.0 % 36.5  35.9  36.0   Platelets 150 - 400 K/uL 119  130  120   NEUT# 1.4 - 7.0 x10E3/uL  2.7    Lymph# 0.7 - 3.1 x10E3/uL  1.1       Body mass index is 37.79 kg/m.  Orders:  No orders of the defined types were placed in this encounter.  No orders of the defined types were placed in this encounter.    Procedures: No procedures performed  Clinical Data: No additional findings.  ROS:  All other systems negative, except as noted in the HPI. Review of Systems  Objective: Vital Signs: Ht '5\' 1"'$  (1.549 m)   Wt 200 lb (90.7 kg)   BMI 37.79 kg/m   Specialty Comments:  No specialty comments available.  PMFS History: Patient Active Problem List   Diagnosis Date Noted  Pancreatic cyst 11/26/2022   Abnormal magnetic resonance cholangiopancreatography (MRCP) 08/27/2022   Papilloma of right breast 07/16/2022   Ventral hernia, recurrent 07/16/2022   Unilateral primary osteoarthritis, right knee 06/04/2022   Peripheral edema 05/12/2021   Chronic hepatitis C without hepatic coma ([place]) 03/25/2021   Acid reflux 03/25/2021   Hepatic cirrhosis ([place])- by CT scan 02/27/2021   AKI (acute kidney injury) ([place]) 02/27/2021   Past Medical History:  Diagnosis Date   Arthritis    right knee,right knee,bilateral ankles   Carpal tunnel syndrome    Cataracts, bilateral    removed   Cirrhosis ([place])    Hepatitis-C    Hypertension    Small bowel obstruction ([place]) 02/27/2021   Substance abuse ([place])    h/o crack,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 updated 10/26/22    Family History  Problem Relation Age of Onset   Other Mother        Hit by a train while in her truck   Lung cancer Father     Liver disease Neg Hx    Colon cancer Neg Hx    Esophageal cancer Neg Hx    Pancreatic cancer Neg Hx    Stomach cancer Neg Hx    Colon polyps Neg Hx    Crohn's disease Neg Hx    Rectal cancer Neg Hx    Ulcerative colitis Neg Hx     Past Surgical History:  Procedure Laterality Date   BREAST BIOPSY WITH RADIO FREQUENCY LOCALIZER Right 08/12/2022   Procedure: BREAST BIOPSY- Scientist, clinical (histocompatibility and immunogenetics);  Surgeon: Ronny Bacon, MD;  Location: [facility];  Service: General;  Laterality: Right;   CATARACT EXTRACTION Bilateral    CHOLECYSTECTOMY     COLONOSCOPY     ERCP     POLYPECTOMY     UMBILICAL HERNIA REPAIR N/A 02/27/2021   Procedure: REPAIR INCARCERATED UMBILICAL HERNIA;  Surgeon: Georganna Skeans, MD;  Location: [facility];  Service: General;  Laterality: N/A;   Social History   Occupational History   Occupation: unemployed  Tobacco Use   Smoking status: Every Day    Packs/day: 0.50    Years: 40.00    Total pack years: 20.00    Types: Cigarettes    Passive exposure: Past   Smokeless tobacco: Never   Tobacco comments:    Quit smoking on 02/27/2021 when hernia repair surgery occurred.  Back to smoking .5 pack of cigarettes daily  Vaping Use   Vaping Use: Never used  Substance and Sexual Activity   Alcohol use: Not Currently   Drug use: Not Currently    Types: "Crack" cocaine    Comment: quit 20 years ago updated 10/26/22   Sexual activity: Not on file

## 2023-01-11 NOTE — Addendum Note (Signed)
Addended by: Meyer Cory on: 01/11/2023 01:50 PM   Modules accepted: Orders

## 2023-01-15 DIAGNOSIS — Z419 Encounter for procedure for purposes other than remedying health state, unspecified: Secondary | ICD-10-CM | POA: Diagnosis not present

## 2023-02-05 ENCOUNTER — Other Ambulatory Visit: Payer: Medicaid Other

## 2023-02-05 ENCOUNTER — Inpatient Hospital Stay: Admission: RE | Admit: 2023-02-05 | Payer: Medicaid Other | Source: Ambulatory Visit

## 2023-02-05 DIAGNOSIS — Z7689 Persons encountering health services in other specified circumstances: Secondary | ICD-10-CM | POA: Diagnosis not present

## 2023-02-09 ENCOUNTER — Ambulatory Visit
Admission: RE | Admit: 2023-02-09 | Discharge: 2023-02-09 | Disposition: A | Discharge status: Home / Self Care | Payer: Medicaid Other | Source: Ambulatory Visit | Attending: Physician Assistant | Admitting: Physician Assistant

## 2023-02-09 DIAGNOSIS — Z7689 Persons encountering health services in other specified circumstances: Secondary | ICD-10-CM | POA: Diagnosis not present

## 2023-02-09 DIAGNOSIS — M25561 Pain in right knee: Secondary | ICD-10-CM

## 2023-02-11 ENCOUNTER — Ambulatory Visit: Payer: Medicaid Other | Admitting: Surgery

## 2023-02-11 ENCOUNTER — Other Ambulatory Visit: Payer: Medicaid Other

## 2023-02-13 DIAGNOSIS — Z7689 Persons encountering health services in other specified circumstances: Secondary | ICD-10-CM | POA: Diagnosis not present

## 2023-02-15 DIAGNOSIS — Z419 Encounter for procedure for purposes other than remedying health state, unspecified: Secondary | ICD-10-CM | POA: Diagnosis not present

## 2023-02-19 ENCOUNTER — Ambulatory Visit
Admission: RE | Admit: 2023-02-19 | Discharge: 2023-02-19 | Disposition: A | Discharge status: Home / Self Care | Payer: Medicaid Other | Source: Ambulatory Visit | Attending: Surgery | Admitting: Surgery

## 2023-02-19 DIAGNOSIS — R928 Other abnormal and inconclusive findings on diagnostic imaging of breast: Secondary | ICD-10-CM | POA: Diagnosis not present

## 2023-02-19 DIAGNOSIS — D241 Benign neoplasm of right breast: Secondary | ICD-10-CM | POA: Diagnosis not present

## 2023-02-19 DIAGNOSIS — Z7689 Persons encountering health services in other specified circumstances: Secondary | ICD-10-CM | POA: Diagnosis not present

## 2023-02-23 ENCOUNTER — Ambulatory Visit: Payer: Medicaid Other | Admitting: Surgery

## 2023-02-23 ENCOUNTER — Encounter: Payer: Self-pay | Admitting: Surgery

## 2023-02-23 VITALS — BP 136/81 | HR 81 | Temp 98.2°F | Ht 61.0 in | Wt 197.0 lb

## 2023-02-23 DIAGNOSIS — K432 Incisional hernia without obstruction or gangrene: Secondary | ICD-10-CM | POA: Diagnosis not present

## 2023-02-23 DIAGNOSIS — K862 Cyst of pancreas: Secondary | ICD-10-CM

## 2023-02-23 DIAGNOSIS — K7460 Unspecified cirrhosis of liver: Secondary | ICD-10-CM | POA: Diagnosis not present

## 2023-02-23 NOTE — Progress Notes (Signed)
Patient ID: Samantha RimaRhonda W Evans, female   DOB: 08/30/1961, 62 y.o.   MRN: [id number]  Chief Complaint: Presents today with known periumbilical hernia.  Follow-up after excisional biopsy right breast.  History of Present Illness Expected right breast f/u after lumpectomy with f/u diagnostic imaging of the right breast.  She reports no new breast issues still has some tethering discomfort when raising right arm. Recurrent anterior abdominal wall hernia about the same, no bowel habit changes reported.  No remarkable pain or tenderness, still wants to proceed with repair.  She continues to smoke about 4 to 5 cigarettes/day.  No reported weight loss. She had a recent MRI of her right knee, anticipating orthopedic consultation. We are following MRI of the abdomen regarding pancreatic lesions. Notes from previous. Samantha Evans is a 62 y.o. female with the above.  Last CT scan was July 2023 confirming the umbilical periumbilical hernia defect.  Prior hernia repair in April 2022.  Reportedly no mesh was utilized at that time as she presented with bowel obstruction.  Currently complains of discomfort during the day but otherwise only presents with a bulge.  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 updated 10/26/22      Past Surgical History:  Procedure Laterality Date   BREAST BIOPSY WITH RADIO FREQUENCY LOCALIZER Right 08/12/2022   Procedure: BREAST BIOPSY- Office managercout Reflector;  Surgeon: Campbell Lernerodenberg, Zissy Hamlett, MD;  Location: [facility] ORS;  Service: General;  Laterality: Right;   CATARACT EXTRACTION Bilateral    CHOLECYSTECTOMY     COLONOSCOPY     ERCP     POLYPECTOMY     UMBILICAL HERNIA REPAIR N/A 02/27/2021   Procedure: REPAIR INCARCERATED UMBILICAL  HERNIA;  Surgeon: Violeta Gelinashompson, Burke, MD;  [facility];  Service: General;  Laterality: N/A;    No Known Allergies  Current Outpatient Medications  Medication Sig Dispense Refill   amLODipine (NORVASC) 5 MG tablet Take 1 tablet (5 mg total) by mouth daily. 90 tablet 1   buPROPion (WELLBUTRIN XL) 150 MG 24 hr tablet TAKE 1 TABLET (150 MG TOTAL) BY MOUTH DAILY. FOR SMOKING CESSATION 90 tablet 1   CALCIUM CITRATE PO Take 1 tablet by mouth See admin instructions. Take 1 tablet by mouth every 3-4 days.     HYDROcodone-acetaminophen (NORCO/VICODIN) 5-325 MG tablet Take 1 tablet by mouth every 6 (six) hours as needed for moderate pain. 15 tablet 0   ibuprofen (ADVIL) 200 MG tablet Take 400 mg by mouth daily as needed for headache.     magnesium hydroxide (MILK OF MAGNESIA) 400 MG/5ML suspension Take by mouth daily as needed for mild constipation.     Misc. Devices MISC BP monitor 1 each 0   Psyllium (METAMUCIL PO) Take 16 oz by mouth See admin instructions. Take 16 oz by mouth every 2 days.     spironolactone (ALDACTONE) 50 MG tablet Take 1 tablet (50 mg total) by mouth daily. 90 tablet 1   No current facility-administered medications for this visit.    Family History Family History  Problem Relation Age of Onset   Other Mother        Hit by a train while in her truck   Lung  cancer Father    Liver disease Neg Hx    Colon cancer Neg Hx    Esophageal cancer Neg Hx    Pancreatic cancer Neg Hx    Stomach cancer Neg Hx    Colon polyps Neg Hx    Crohn's disease Neg Hx    Rectal cancer Neg Hx    Ulcerative colitis Neg Hx       Social History Social History   Tobacco Use   Smoking status: Every Day    Packs/day: 0.50    Years: 40.00    Additional pack years: 0.00    Total pack years: 20.00    Types: Cigarettes    Passive exposure: Past   Smokeless tobacco: Never   Tobacco comments:    Quit smoking on 02/27/2021 when hernia repair surgery occurred.  Back to smoking .5 pack of  cigarettes daily  Vaping Use   Vaping Use: Never used  Substance Use Topics   Alcohol use: Not Currently   Drug use: Not Currently    Types: "Crack" cocaine    Comment: quit 20 years ago updated 10/26/22        Review of Systems  Constitutional: Negative.   HENT: Negative.    Eyes: Negative.   Respiratory: Negative.    Cardiovascular:  Positive for leg swelling.  Gastrointestinal:  Positive for abdominal pain.  Genitourinary:  Positive for frequency.  Skin: Negative.   Neurological:  Positive for headaches.  Psychiatric/Behavioral: Negative.    All other systems reviewed and are negative.     Physical Exam There were no vitals taken for this visit.    CONSTITUTIONAL: Well developed, and nourished, appropriately responsive and aware without distress.   EYES: Sclera non-icteric.   EARS, NOSE, MOUTH AND THROAT:  The oropharynx is clear. Oral mucosa is pink and moist.   Hearing is intact to voice.  NECK: Trachea is midline, and there is no jugular venous distension.  LYMPH NODES:  Lymph nodes in the neck are not enlarged. RESPIRATORY:  Lungs are clear, and breath sounds are equal bilaterally. Normal respiratory effort without pathologic use of accessory muscles. CARDIOVASCULAR: Heart is regular in rate and rhythm. GI: The abdomen is soft, nontender, and nondistended.  There is a reducible midline periumbilical hernia, I believe I can appreciate an approximate 30 cm fascial defect present.  There were no palpable masses. I did not appreciate hepatosplenomegaly. There were normal bowel sounds.  No succussion splash of ascites.  No obvious stigmata of cirrhosis/portal hypertension. Caryl Lyn present as chaperone: Right breast examination, lateral incision is clean, dry and intact.  No induration, erythema or fluctuance to suggest hematoma or infection.  No suspicious or dominant masses present, slight tethering and dimpling with raising her right arm. MUSCULOSKELETAL:  Symmetrical  muscle tone appreciated in all four extremities.    SKIN: Skin turgor is normal. No pathologic skin lesions appreciated.  NEUROLOGIC:  Motor and sensation appear grossly normal.  Cranial nerves are grossly without defect. PSYCH:  Alert and oriented to person, place and time. Affect is appropriate for situation.  Data Reviewed I have personally reviewed what is currently available of the patient's imaging, recent labs and medical records.   Labs:     Latest Ref Rng & Units 06/07/2022    9:25 PM 05/26/2022   11:28 AM 03/13/2022    7:50 AM  CBC  WBC 4.0 - 10.5 K/uL 5.5  4.3  5.0   Hemoglobin 12.0 - 15.0 g/dL 03.8  12.5  11.8   Hematocrit 36.0 - 46.0 % 36.5  35.9  36.0   Platelets 150 - 400 K/uL 119  130  120       Latest Ref Rng & Units 09/28/2022    2:44 PM 06/23/2022   11:32 AM 06/07/2022    9:25 PM  CMP  Glucose 70 - 99 mg/dL 81   161   BUN 8 - 27 mg/dL 17   22   Creatinine 0.96 - 1.00 mg/dL 0.45   4.09   Sodium 811 - 144 mmol/L 142   136   Potassium 3.5 - 5.2 mmol/L 4.1   4.3   Chloride 96 - 106 mmol/L 105   106   CO2 20 - 29 mmol/L 23   24   Calcium 8.7 - 10.3 mg/dL 91.4  78.2  95.6   Total Protein 6.0 - 8.5 g/dL 7.3   7.2   Total Bilirubin 0.0 - 1.2 mg/dL 0.8   0.8   Alkaline Phos 44 - 121 IU/L 121   106   AST 0 - 40 IU/L 30   33   ALT 0 - 32 IU/L 11   21    SURGICAL PATHOLOGY  Excisional Biopsy CASE: [id number]  PATIENT: [facility]  Surgical Pathology Report   Specimen Submitted:  A. Breast, right lateral   Clinical History: Papilloma right breast    DIAGNOSIS:  A.  BREAST, RIGHT LATERAL; IMAGE LOCALIZED EXCISION:  - MULTIPLE INTRADUCTAL PAPILLOMATA/PAPILLOMATOSIS.  - RADIAL SCARS, FOCI OF SCLEROSING ADENOSIS AND MICROCALCIFICATIONS.  - SMALL HYALINIZED FIBROADENOMA WITH CALCIFICATIONS.  - BIOPSY CLIP AND PREVIOUS BIOPSY SITE RELATED CHANGES.  - FEW MARGINS ARE CLOSE BUT NEGATIVE FOR PAPILLOMA.   Comment  No atypical hyperplasia or malignancy are seen.   The spectrum of changes  noted above and, in multiple sections of this lumpectomy specimen may  represent a complex sclerosing lesion.   GROSS DESCRIPTION:  A. Labeled: Right lateral breast tissue  Received: Without fixative  Specimen radiograph image(s) available for review  Radiographic findings: A clip and RFID tag are seen on imaging.  Time in fixative: Specimen is collected at 8:40 AM and placed in  formalin at 8:56 AM on 08/12/2022.                                   Cold  ischemic time: Less than 30 minutes  Total fixation time: 10 hours  Type of procedure: Breast biopsy with radial frequency localizer  Location / laterality of specimen: Right  Orientation of specimen: Specimen is oriented with a suture at anterior  margin.  Remaining presumed orientation discussed with pathologist.  Inking:  Note: Margins presumed due to single suture orientation of specimen.  Anterior = green  Inferior = blue  Lateral = orange  Medial = yellow  Posterior = black  Superior = red  Size of specimen: 15.7 g, 5.6 cm (anterior to posterior) x 4.7 cm  (medial to lateral) x 1.2 cm (superior to inferior)  Skin: None present  Biopsy site: A silver-colored clip is identified within tissue.  Number of discrete masses: 1  Size of mass(es): 1.4 (medial to lateral) x 0.7 cm (anterior to  posterior) x 0.7 cm (superior to inferior)  Description of mass(es): A single ill-defined area of rubbery pink-tan  fibrous tissue containing a silver-colored metallic clip and RFID tag.  Distance between masses/clips: Clip and tag within mass.  Margins:  Abutting inferior margin, 0.4 = lateral, 1.2 = medial, 0.1 =  superior, 2.0 = posterior, 3.1 = anterior  Description of remainder of tissue: Remaining cut surfaces are  yellow-tan and fatty, with few additional fibrous foci, measuring up to  0.4 cm in greatest dimension.  Ratio of fatty tissue to fibrous tissue  is 98/2.  Hemorrhage is noted in planes immediately  anterior to clip  location.  No additional masses, calcifications, or areas of hemorrhage  are grossly identified.  Specimen is entirely submitted from anterior to  posterior.   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 Of note, the surgical specimen demonstrated papillomatosis/radial scars and no evidence of atypia or malignancy.   EXAM: DIGITAL DIAGNOSTIC UNILATERAL RIGHT MAMMOGRAM WITH TOMOSYNTHESIS   TECHNIQUE: Right digital diagnostic mammography and breast tomosynthesis was performed.   COMPARISON:  Previous exam(s).   ACR Breast Density Category b: There are scattered areas of fibroglandular density.   FINDINGS: Interval development of density and architectural distortion in the upper-outer right breast, consistent with post excisional biopsy changes. No new suspicious mass, calcification, or other findings are identified in the right breast.   IMPRESSION: Interval postsurgical changes in the upper-outer right breast status post excisional biopsy. No mammographic evidence of malignancy in the right breast.   RECOMMENDATION: Bilateral diagnostic mammogram in 4 months (due in August 2024) to continue with post lumpectomy imaging surveillance protocol.   I have discussed the findings and recommendations with the patient. If applicable, a reminder letter will be sent to the patient regarding the next appointment.   BI-RADS CATEGORY  2: Benign.     Electronically Signed   By: Jacob Moores M.D.   On: 02/19/2023 15:08       EXAM: CT ABDOMEN AND PELVIS WITH CONTRAST   TECHNIQUE: Multidetector CT imaging of the abdomen and pelvis was performed using the standard protocol following bolus administration of intravenous contrast.   RADIATION DOSE REDUCTION: This exam was performed  according to the departmental dose-optimization program which includes automated exposure control, adjustment of the mA and/or kV according to patient size and/or use of iterative reconstruction technique.   CONTRAST:  OMNIPAQUE IOHEXOL 300 MG/ML  SOLN   COMPARISON:  None Available.   FINDINGS: Lower Chest: Normal.   Hepatobiliary: Diffusely nodular hepatic contours, consistent with hepatic cirrhosis. No focal liver lesion. There is unchanged intrahepatic biliary dilatation. Unchanged cyst in the left hepatic lobe. Status post cholecystectomy.   Pancreas: Normal pancreas. No ductal dilatation or peripancreatic fluid collection.   Spleen: Normal.   Adrenals/Urinary Tract: The adrenal glands are normal. No hydronephrosis, nephroureterolithiasis or solid renal mass. The urinary bladder is normal for degree of distention   Stomach/Bowel: There is no hiatal hernia. Normal duodenal course and caliber. There is a ventral abdominal hernia that contains a loop of small bowel with mild adjacent fat stranding and a small amount of fluid in the hernia sac. No proximal dilatation. Slightly superiorly, there is a second fat containing ventral hernia. Rectosigmoid diverticulosis without acute inflammation. Normal appendix.   Vascular/Lymphatic: There is calcific atherosclerosis of the abdominal aorta. No lymphadenopathy.   Reproductive: Normal uterus. No adnexal mass.   Other: None.   Musculoskeletal: No bony spinal canal stenosis or focal osseous abnormality.   IMPRESSION: 1. Ventral abdominal hernia that contains a loop of small bowel with mild adjacent fat stranding  and a small amount of fluid in the hernia sac. This may indicate early strangulation. 2. Hepatic cirrhosis. 3. Rectosigmoid diverticulosis without acute inflammation. 4. Aortic Atherosclerosis (ICD10-I70.0).     Electronically Signed   By: Deatra Robinson M.D.   On: 06/08/2022 01:38    CLINICAL DATA:   Cirrhosis, follow-up liver lesions and pancreatic lesion   EXAM: MRI ABDOMEN WITHOUT AND WITH CONTRAST   TECHNIQUE: Multiplanar multisequence MR imaging of the abdomen was performed both before and after the administration of intravenous contrast.   CONTRAST:  9mL GADAVIST GADOBUTROL 1 MMOL/ML IV SOLN   COMPARISON:  MR abdomen, 04/08/2022   FINDINGS: Lower chest: No acute abnormality.   Hepatobiliary: Coarse, nodular cirrhotic morphology of the liver. Numerous unchanged hepatic parenchymal and peribiliary cysts (series 12, image 13). Occasional tiny foci of arterial hyperenhancement scattered throughout the liver are not significantly changed, for example in the posterior right lobe of the liver, hepatic segment VII measuring 0.6 cm (series 22, image 31) and in the anterior left lobe of the liver, hepatic segment II measuring 0.6 cm (series 22, image 21). No evidence of washout or capsular enhancement. Status post cholecystectomy. Unchanged postoperative biliary ductal dilatation.   Pancreas: Multiple pancreatic cystic lesions are not significantly changed, again including of the pancreatic head (series 12, image 20), the ventral pancreatic neck (series 12, image 17), and the largest lesion in the tip of the pancreatic tail measuring 2.0 x 1.5 cm (series 12, image 18). No pancreatic ductal dilatation or surrounding inflammatory changes.   Spleen: Splenomegaly, maximum coronal span 15.1 cm.   Adrenals/Urinary Tract: Adrenal glands are unremarkable. Simple, benign bilateral renal cortical cysts and subcentimeter lesions too small to characterize although almost certainly additional simple cysts, benign, for which no further follow-up or characterization is required. Kidneys are otherwise normal, without renal calculi, solid lesion, or hydronephrosis.   Stomach/Bowel: Stomach is within normal limits. Somewhat thickened, hyperenhancing appearance of the cecum and ascending  colon, imaging limited on this examination of the abdomen (series 28, image 20).   Vascular/Lymphatic: Aortic atherosclerosis. No enlarged abdominal lymph nodes.   Other: Small, fat containing midline epigastric hernia (series 29, image 76). No ascites.   Musculoskeletal: No acute or significant osseous findings.   IMPRESSION: 1. Cirrhosis. Occasional tiny foci of arterial hyperenhancement scattered throughout the liver are not significantly changed. No evidence of washout or capsular enhancement. These remain LI-RADS category 3, intermediate probability of malignancy. Recommend continued follow-up at 6 months. 2. Multiple pancreatic cystic lesions are not significantly changed, the largest lesion in the tip of the pancreatic tail measuring 2.0 x 1.5 cm. 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 Recommend follow-up in 6 months to ensure stability, which again can be performed in conjunction with above recommended LI-RADS surveillance. 3. Somewhat thickened, hyperenhancing appearance of the cecum and ascending colon, imaging limited on this examination of the abdomen. This most likely reflects portal colopathy in the setting of cirrhosis. 4. Splenomegaly.   Aortic Atherosclerosis (ICD10-I70.0).     Electronically Signed   By: Jearld Lesch M.D.   On: 09/28/2022 07:12    Assessment     Patient Active Problem List   Diagnosis Date Noted   Pancreatic cyst 11/26/2022   Abnormal magnetic resonance cholangiopancreatography (MRCP) 08/27/2022   Papilloma of right breast 07/16/2022   Ventral hernia, recurrent 07/16/2022   Unilateral primary osteoarthritis, right knee 06/04/2022   Peripheral edema 05/12/2021   Chronic hepatitis C without hepatic coma 03/25/2021  Acid reflux 03/25/2021   Hepatic cirrhosis (HCC)- by CT scan 02/27/2021   AKI (acute kidney injury) 02/27/2021    Plan    Repeat MRI ABDOMEN WITHOUT AND WITH CONTRAST May 2024  Bilateral diagnostic  mammography August 2024.  Advised she follow-up with orthopedic surgery regarding her right knee concerns. Patient needs follow-up imaging regarding hepatic lesions and pancreatic lesions with MRI.  We discussed proceeding with her hernia repair, however I feel it is prudent to defer elective surgery for continued follow-up/observation, and complete workup of her current right knee pain issues. .  I have also encouraged her to watch her diet, improve her health with some weight loss, and stressed smoking cessation for the sake of her postoperative course.  I believe she understands this and still wants to get her hernia repaired. Risks reviewed and accepted.  All questions answered.  Face-to-face time spent with the patient and accompanying care providers(if present) was 45 minutes, with more than 50% of the time spent counseling, educating, and coordinating care of the patient.    These notes generated with voice recognition software. I apologize for typographical errors.  Campbell Lerner M.D., FACS 02/23/2023, 1:12 PM

## 2023-02-23 NOTE — Patient Instructions (Addendum)
We would like for you to quit smoking and work on losing weight. You might try chewing gum. You can also speak with your primary care provider about this. They have resources to help you.   You are scheduled for an MRI of the abdomen at [facility]) on 03/31/23. You will need to arrive at [facility] at 12:45 pm. You may not have anything to eat or drink for 4 hours prior to this scan.  We will call you about these results.   Follow up with Orthopedics about your knee.   Follow up here in August after your mammogram. We will send you a letter about these appointments.     Please call and ask to speak with a nurse if you develop questions or concerns.

## 2023-02-25 ENCOUNTER — Other Ambulatory Visit (INDEPENDENT_AMBULATORY_CARE_PROVIDER_SITE_OTHER): Payer: Medicaid Other

## 2023-02-25 ENCOUNTER — Ambulatory Visit: Payer: Medicaid Other | Admitting: Gastroenterology

## 2023-02-25 ENCOUNTER — Encounter: Payer: Self-pay | Admitting: Gastroenterology

## 2023-02-25 VITALS — BP 126/76 | HR 83 | Ht 61.0 in | Wt 197.0 lb

## 2023-02-25 DIAGNOSIS — K7460 Unspecified cirrhosis of liver: Secondary | ICD-10-CM

## 2023-02-25 DIAGNOSIS — K766 Portal hypertension: Secondary | ICD-10-CM

## 2023-02-25 DIAGNOSIS — R932 Abnormal findings on diagnostic imaging of liver and biliary tract: Secondary | ICD-10-CM

## 2023-02-25 DIAGNOSIS — Z7689 Persons encountering health services in other specified circumstances: Secondary | ICD-10-CM | POA: Diagnosis not present

## 2023-02-25 DIAGNOSIS — K869 Disease of pancreas, unspecified: Secondary | ICD-10-CM | POA: Diagnosis not present

## 2023-02-25 LAB — CBC WITH DIFFERENTIAL/PLATELET
Basophils Absolute: 0 10*3/uL (ref 0.0–0.1)
Basophils Relative: 1 % (ref 0.0–3.0)
Eosinophils Absolute: 0.2 10*3/uL (ref 0.0–0.7)
Eosinophils Relative: 3.9 % (ref 0.0–5.0)
HCT: 40 % (ref 36.0–46.0)
Hemoglobin: 14.3 g/dL (ref 12.0–15.0)
Lymphocytes Relative: 23 % (ref 12.0–46.0)
Lymphs Abs: 1 10*3/uL (ref 0.7–4.0)
MCHC: 35.7 g/dL (ref 30.0–36.0)
MCV: 89.8 fl (ref 78.0–100.0)
Monocytes Absolute: 0.3 10*3/uL (ref 0.1–1.0)
Monocytes Relative: 7.3 % (ref 3.0–12.0)
Neutro Abs: 2.9 10*3/uL (ref 1.4–7.7)
Neutrophils Relative %: 64.8 % (ref 43.0–77.0)
Platelets: 109 10*3/uL — ABNORMAL LOW (ref 150.0–400.0)
RBC: 4.45 Mil/uL (ref 3.87–5.11)
RDW: 14.2 % (ref 11.5–15.5)
WBC: 4.5 10*3/uL (ref 4.0–10.5)

## 2023-02-25 LAB — COMPREHENSIVE METABOLIC PANEL
ALT: 12 U/L (ref 0–35)
AST: 21 U/L (ref 0–37)
Albumin: 4.1 g/dL (ref 3.5–5.2)
Alkaline Phosphatase: 89 U/L (ref 39–117)
BUN: 16 mg/dL (ref 6–23)
CO2: 26 mEq/L (ref 19–32)
Calcium: 11.2 mg/dL — ABNORMAL HIGH (ref 8.4–10.5)
Chloride: 107 mEq/L (ref 96–112)
Creatinine, Ser: 0.79 mg/dL (ref 0.40–1.20)
GFR: 80.31 mL/min (ref >60.00)
Glucose, Bld: 108 mg/dL — ABNORMAL HIGH (ref 70–99)
Potassium: 3.9 mEq/L (ref 3.5–5.1)
Sodium: 139 mEq/L (ref 135–145)
Total Bilirubin: 0.8 mg/dL (ref 0.2–1.2)
Total Protein: 7.2 g/dL (ref 6.0–8.3)

## 2023-02-25 LAB — PROTIME-INR
INR: 1.3 ratio — ABNORMAL HIGH (ref 0.8–1.0)
Prothrombin Time: 13.3 s — ABNORMAL HIGH (ref 9.6–13.1)

## 2023-02-25 NOTE — Patient Instructions (Addendum)
It was a pleasure to see you today.  Congratulations on your success cutting back on cigarettes. I know you can quit all together!   Followin 2000 mg sodium restricted diet   Daily exercise recommended with strength exercises/toning  Please stop in the lab today  I did not change your medicines  You should have an MRI in May to follow-up on the liver and pancreas  I will arrange for office follow-up after the MRI.   Please contact the office with any concerns before then.   _______________________________________________________  If your blood pressure at your visit was 140/90 or greater, please contact your primary care physician to follow up on this.  _______________________________________________________  If you are age 62 or older, your body mass index should be between 23-30. Your Body mass index is 37.22 kg/m. If this is out of the aforementioned range listed, please consider follow up with your Primary Care Provider.  If you are age 87 or younger, your body mass index should be between 19-25. Your Body mass index is 37.22 kg/m. If this is out of the aformentioned range listed, please consider follow up with your Primary Care Provider.   ________________________________________________________  The [facility] providers would like to encourage you to use [facility] to communicate with providers for non-urgent requests or questions.  Due to long hold times on the telephone, sending your provider a message by [facility] may be a faster and more efficient way to get a response.  Please allow 48 business hours for a response.  Please remember that this is for non-urgent requests.  _______________________________________________________ It was a pleasure to see you today!  Thank you for trusting me with your gastrointestinal care!

## 2023-02-25 NOTE — Progress Notes (Signed)
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    - completed HAV and HBV vaccines through the [facility] Genotype 1b HCV receiving treatment through the ID clinic    - last seen 07/2021 Abnormal liver imaging     - CT 4/22: 2.1 cm density in the right hepatic lobe    - MRI 02/27/21 - likely perfusion anomaly, repeat in 6 months recommended    - MRI 11/13/21 - persistent abnormality LI-RADs 3, repeat in 6 months recommended    - MRI 09/24/22: cirrhosis, LI-RADs 3 liver lesion, 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 Abnormal pancreatic imaging    - MRI 11/13/21: enlarging cystic lesion at the junction of the head and body of the pancreas    - MRI 09/24/22: 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, surveillance recommended in 3 months Mildly elevated AFP 12 03/06/21, normalized to 5.0 01/16/22 LE edema on diuretics, no history of ascites by exam or imaging History of colon polyps    - Surveillance colonoscopy recommended 1 year    - concern that large advanced polyp with high grade dysplasia was not completely resected 7/22    - failed attempt at colonoscopy 12/04/22 due to poor prep Small esophageal varices and portal hypertensive gastropathy on EGD 06/11/21 Mild coagulopathy with INR 1.2 Prior cholecystectomy Diverticulosis Incarcerated umbilical hernia repair 02/27/21 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  HPI: Samantha Evans is a 62 y.o. female who returns in follow-up.  She was last seen 12/04/22. The interval history is obtained through the patient and review of her electronic health record. She is unaccompanied to this visit.  She has cirrhosis due to history of HCV complicated by ascites, splenomegaly, non-bleeding esophageal varices, mild coagulopathy, umbilical hernia and a 2.1 cm density in the right hepatic lobe. Follow-up MRIs have showed no HCC but the most recent MRI from 09/24/22 showed a persistent perfusion anomaly along the liver capsule suspicious for a LI-RADS category 3 lesion, cystic lesions in the head of the pancreas, and an enlarging cystic lesion at the junction of the head and body of the pancreas.  The radiologist suggested MRI with MRCP in 6 months for follow-up. She had a failed attempt at colonoscopy due to poor prep 1/189/24.  She returns today in scheduled follow-up. Returns in follow-up. Down to 4 cigarettes. She is trying to quit smoking and lose weight. She has no ongoing GI symptoms.  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 Of note, the surgical specimen demonstrated papillomatosis/radial scars and no evidence of atypia or malignancy. The surgeon discussed surgery but recommended deferring the surgery to quit smoking and  lose weight.   Endoscopic history: - EGD 05/20/21: 3 colums of grade 1 esophageal varices, severe portal hypertensive gastropathy - Screening colonoscopy 7/27/ 22: One 15 mm polyp in the sigmoid colon with high grade dysplasia and unclear margins. One 3 mm tubular adenoma in the sigmoid colon. 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 Path results not  available.  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 updated 10/26/22    Past Surgical History:  Procedure Laterality Date   BREAST BIOPSY WITH RADIO FREQUENCY LOCALIZER Right 08/12/2022   Procedure: BREAST BIOPSY- Office manager;  Surgeon: Campbell Lerner, MD;  Location: [facility] ORS;  Service: General;  Laterality: Right;   CATARACT EXTRACTION Bilateral    CHOLECYSTECTOMY     COLONOSCOPY     ERCP     POLYPECTOMY     UMBILICAL HERNIA REPAIR N/A 02/27/2021   Procedure: REPAIR INCARCERATED UMBILICAL HERNIA;  Surgeon: Violeta Gelinas, MD;  Location: [facility];  Service: General;  Laterality: N/A;    Current Outpatient Medications  Medication Sig Dispense Refill   amLODipine (NORVASC) 5 MG tablet Take 1 tablet (5 mg total) by mouth daily. 90 tablet 1   buPROPion (WELLBUTRIN XL) 150 MG 24 hr tablet TAKE 1 TABLET (150 MG TOTAL) BY MOUTH DAILY. FOR SMOKING CESSATION 90 tablet 1   CALCIUM CITRATE PO Take 1 tablet by mouth See admin instructions. Take 1 tablet by mouth every 3-4 days. (Patient not taking: Reported on 02/25/2023)     HYDROcodone-acetaminophen (NORCO/VICODIN) 5-325 MG tablet Take 1 tablet by mouth every 6 (six) hours as needed for moderate pain. (Patient not taking: Reported on 02/25/2023) 15 tablet 0   ibuprofen (ADVIL) 200 MG tablet Take 400 mg by mouth daily as needed for headache. (Patient not taking: Reported on 02/25/2023)     magnesium hydroxide (MILK OF  MAGNESIA) 400 MG/5ML suspension Take by mouth daily as needed for mild constipation. (Patient not taking: Reported on 02/25/2023)     Misc. Devices MISC BP monitor (Patient not taking: Reported on 02/25/2023) 1 each 0   Psyllium (METAMUCIL PO) Take 16 oz by mouth See admin instructions. Take 16 oz by mouth every 2 days. (Patient not taking: Reported on 02/25/2023)     spironolactone (ALDACTONE) 50 MG tablet Take 1 tablet (50 mg total) by mouth daily. (Patient not taking: Reported on 02/25/2023) 90 tablet 1   No current facility-administered medications for this visit.    Allergies as of 02/25/2023   (No Known Allergies)     Physical Exam: General:   Alert, in NAD. No scleral icterus. No bilateral temporal wasting.  Heart:  Regular rate and rhythm; no murmurs Pulm: Clear anteriorly; no wheezing Abdomen:  Soft. Nontender. Reducible abdominal wall hernia.  Nondistended. Normal bowel sounds. No rebound or guarding. No fluid wave.  LAD: No inguinal or umbilical LAD Extremities:  1-2+ LE edema. Neurologic:  Alert and  oriented x4;  grossly normal neurologically; no asterixis  or clonus. Skin: No jaundice. Spider angioma on the chest wall. No palmar erythema.  Psych:  Alert and cooperative. Normal mood and affect.    Soua Lenk L. Orvan Falconer, MD, MPH 02/25/2023, 8:14 PM

## 2023-02-26 LAB — AFP TUMOR MARKER: AFP-Tumor Marker: 4.5 ng/mL

## 2023-02-26 LAB — CANCER ANTIGEN 19-9: CA 19-9: <3 U/mL (ref <34)

## 2023-03-01 ENCOUNTER — Ambulatory Visit: Payer: Medicaid Other | Admitting: Physician Assistant

## 2023-03-01 ENCOUNTER — Encounter: Payer: Self-pay | Admitting: Physician Assistant

## 2023-03-01 DIAGNOSIS — M1711 Unilateral primary osteoarthritis, right knee: Secondary | ICD-10-CM

## 2023-03-01 DIAGNOSIS — Z7689 Persons encountering health services in other specified circumstances: Secondary | ICD-10-CM | POA: Diagnosis not present

## 2023-03-01 NOTE — Progress Notes (Signed)
Office Visit Note   Patient: Samantha Evans           Date of Birth: May 08, 1961           MRN: [id number] Visit Date: 03/01/2023              Requested by: Samantha Register, MD [address] [place] [address] [place],  [place] [address] PCP: Samantha Register, MD  Chief Complaint  Patient presents with   Right Knee - Follow-up    MRI review      HPI: Samantha Evans is a pleasant 62 year old woman who is formally followed by Dr. Cleophas Evans.  She has a history of right knee arthritis that is been going on for 4 years.  She states her pain is moderate and special she has been walking on it.  She did not get any relief from steroid injection not even diagnostic so an MRI was ordered just to be sure there were no other major issues.  She did have a proximal fibula fracture in this knee several years ago.  She has been having knee pain for 4 years and originally it was laterally but now she admits globally her knee hurts as well as going up and down stairs  Assessment & Plan: Visit Diagnoses:  1. Unilateral primary osteoarthritis, right knee     Plan: MRI was reviewed with her today she has a pretty significant medial meniscus tear but also has tricompartmental arthritis moderate to advanced laterally full-thickness cartilage loss medially with joint space narrowing her lateral meniscus is intact.  She has a severely degenerated anterior horn of the medial meniscus.  Her pain is not isolated medially she also on x-ray has questionable CPPD.  Unfortunately she cannot take anti-inflammatories because of kidney issues.  She does use topical Voltaren gel.  Her current BMI is 37.2.  I discussed with her that ultimately to relieve her pain the most predictable would be knee replacement.  Certainly she can do that when she is ready.  She is going to think about it.  Be happy to refer her to one of our surgeons.  In the meantime she has not really tried any physical therapy and I think learning some exercises and doing  some water therapy may be helpful for her she will contact me if she wants to go for this  Follow-Up Instructions: No follow-ups on file.   Ortho Exam  Patient is alert, oriented, no adenopathy, well-dressed, normal affect, normal respiratory effort. Right knee she does have no warmth minimal effusion today but she does have soft tissue swelling she is neurovascular intact she has good dorsiflexion plantarflexion she has tenderness medially and laterally and significant grinding at the patellofemoral joint with range of motion no signs of an infective process  Imaging: No results found. No images are attached to the encounter.  Labs: Lab Results  Component Value Date   HGBA1C 4.8 05/26/2022     Lab Results  Component Value Date   ALBUMIN 4.1 02/25/2023   ALBUMIN 4.1 09/28/2022   ALBUMIN 3.4 (L) 06/07/2022    No results found for: "MG" No results found for: "VD25OH"  No results found for: "PREALBUMIN"    Latest Ref Rng & Units 02/25/2023    2:58 PM 06/07/2022    9:25 PM 05/26/2022   11:28 AM  CBC EXTENDED  WBC 4.0 - 10.5 K/uL 4.5  5.5  4.3   RBC 3.87 - 5.11 Mil/uL 4.45  4.20  4.24   Hemoglobin  12.0 - 15.0 g/dL 78.2  95.6  21.3   HCT 36.0 - 46.0 % 40.0  36.5  35.9   Platelets 150.0 - 400.0 K/uL 109.0  119  130   NEUT# 1.4 - 7.7 K/uL 2.9   2.7   Lymph# 0.7 - 4.0 K/uL 1.0   1.1      There is no height or weight on file to calculate BMI.  Orders:  No orders of the defined types were placed in this encounter.  No orders of the defined types were placed in this encounter.    Procedures: No procedures performed  Clinical Data: No additional findings.  ROS:  All other systems negative, except as noted in the HPI. Review of Systems  Objective: Vital Signs: There were no vitals taken for this visit.  Specialty Comments:  No specialty comments available.  PMFS History: Patient Active Problem List   Diagnosis Date Noted   Pancreatic cyst 11/26/2022    Abnormal magnetic resonance cholangiopancreatography (MRCP) 08/27/2022   Papilloma of right breast 07/16/2022   Ventral hernia, recurrent 07/16/2022   Unilateral primary osteoarthritis, right knee 06/04/2022   Peripheral edema 05/12/2021   Chronic hepatitis C without hepatic coma 03/25/2021   Acid reflux 03/25/2021   Hepatic cirrhosis (HCC)- by CT scan 02/27/2021   AKI (acute kidney injury) 02/27/2021   Past Medical History:  Diagnosis Date   Arthritis    right knee,right knee,bilateral ankles   Carpal tunnel syndrome    Cataracts, bilateral    removed   Cirrhosis    Hepatitis-C    Hypertension    Small bowel obstruction 02/27/2021   Substance abuse    h/o crack,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 updated 10/26/22    Family History  Problem Relation Age of Onset   Other Mother        Hit by a train while in her truck   Lung cancer Father    Liver disease Neg Hx    Colon cancer Neg Hx    Esophageal cancer Neg Hx    Pancreatic cancer Neg Hx    Stomach cancer Neg Hx    Colon polyps Neg Hx    Crohn's disease Neg Hx    Rectal cancer Neg Hx    Ulcerative colitis Neg Hx     Past Surgical History:  Procedure Laterality Date   BREAST BIOPSY WITH RADIO FREQUENCY LOCALIZER Right 08/12/2022   Procedure: BREAST BIOPSY- Office manager;  Surgeon: Campbell Lerner, MD;  Location: [facility] ORS;  Service: General;  Laterality: Right;   CATARACT EXTRACTION Bilateral    CHOLECYSTECTOMY     COLONOSCOPY     ERCP     POLYPECTOMY     UMBILICAL HERNIA REPAIR N/A 02/27/2021   Procedure: REPAIR INCARCERATED UMBILICAL HERNIA;  Surgeon: Violeta Gelinas, MD;  Location: [facility] OR;  Service: General;  Laterality: N/A;   Social History   Occupational History   Occupation: unemployed  Tobacco Use   Smoking status: Every Day    Packs/day: 0.50    Years: 40.00    Additional pack years: 0.00    Total pack years: 20.00    Types: Cigarettes    Passive exposure: Past   Smokeless tobacco: Never   Tobacco comments:     Quit smoking on 02/27/2021 when hernia repair surgery occurred.  Back to smoking .5 pack of cigarettes daily  Vaping Use   Vaping Use: Never used  Substance and Sexual Activity   Alcohol use: Not Currently   Drug use: Not Currently  Types: "Crack" cocaine    Comment: quit 20 years ago updated 10/26/22   Sexual activity: Not on file

## 2023-03-11 ENCOUNTER — Telehealth: Payer: Self-pay | Admitting: Physician Assistant

## 2023-03-11 NOTE — Telephone Encounter (Signed)
Patient called in stating she does not want to do Physical Therapy and she would like yo go through with the knee replacement, please advise

## 2023-03-16 ENCOUNTER — Other Ambulatory Visit: Payer: Self-pay | Admitting: Family Medicine

## 2023-03-16 DIAGNOSIS — I10 Essential (primary) hypertension: Secondary | ICD-10-CM

## 2023-03-16 NOTE — Telephone Encounter (Unsigned)
Copied from CRM [id number]. Topic: General - Other >> Mar 16, 2023  4:00 PM Everette C wrote: Reason for CRM: Medication Refill - Medication: spironolactone (ALDACTONE) 50 MG tablet [[id number]]  amLODipine (NORVASC) 5 MG tablet [[id number]]    Has the patient contacted their pharmacy? Yes.   (Agent: If no, request that the patient contact the pharmacy for the refill. If patient does not wish to contact the pharmacy document the reason why and proceed with request.) (Agent: If yes, when and what did the pharmacy advise?)  Preferred Pharmacy (with phone number or street name): [id number] [place], [place] - [address] [place] [address] Phone: [phone] Fax: [phone] Hours: Not open 24 hours   Has the patient been seen for an appointment in the last year OR does the patient have an upcoming appointment? Yes.    Agent: Please be advised that RX refills may take up to 3 business days. We ask that you follow-up with your pharmacy.

## 2023-03-17 DIAGNOSIS — Z419 Encounter for procedure for purposes other than remedying health state, unspecified: Secondary | ICD-10-CM | POA: Diagnosis not present

## 2023-03-17 MED ORDER — AMLODIPINE BESYLATE 5 MG PO TABS
5.0000 mg | ORAL_TABLET | Freq: Every day | ORAL | 0 refills | Status: DC
Start: 2023-03-17 — End: 2023-06-30

## 2023-03-17 MED ORDER — SPIRONOLACTONE 50 MG PO TABS
50.0000 mg | ORAL_TABLET | Freq: Every day | ORAL | 0 refills | Status: DC
Start: 2023-03-17 — End: 2023-06-30

## 2023-03-17 NOTE — Telephone Encounter (Signed)
Requested Prescriptions  Pending Prescriptions Disp Refills   amLODipine (NORVASC) 5 MG tablet 90 tablet 0    Sig: Take 1 tablet (5 mg total) by mouth daily.     Cardiovascular: Calcium Channel Blockers 2 Passed - 03/16/2023  5:08 PM      Passed - Last BP in normal range    BP Readings from Last 1 Encounters:  02/25/23 126/76         Passed - Last Heart Rate in normal range    Pulse Readings from Last 1 Encounters:  02/25/23 83         Passed - Valid encounter within last 6 months    Recent Outpatient Visits           5 months ago Smoking greater than 20 pack years   [facility] [place], Odette Horns, MD   8 months ago Ventral hernia without obstruction or gangrene   [facility] Hoy Register, MD   9 months ago Annual physical exam   [facility] Hoy Register, MD   1 year ago Ventral hernia without obstruction or gangrene   [facility] [facility] Hoy Register, MD   1 year ago Essential hypertension   [facility] Hoy Register, MD       Future Appointments             In 6 days Tarry Kos, MD [facility] [place]   In 1 week Hoy Register, MD [facility]             spironolactone (ALDACTONE) 50 MG tablet 90 tablet 0    Sig: Take 1 tablet (50 mg total) by mouth daily.     Cardiovascular: Diuretics - Aldosterone Antagonist Passed - 03/16/2023  5:08 PM      Passed - Cr in normal range and within 180 days    Creat  Date Value Ref Range Status  05/12/2021 0.78 0.50 - 0.99 mg/dL Final    Comment:    For patients >11 years of age, the reference limit for Creatinine is approximately 13% higher for people identified as African-American. .    Creatinine, Ser  Date Value Ref Range Status  02/25/2023 0.79 0.40 - 1.20 mg/dL Final         Passed - K in  normal range and within 180 days    Potassium  Date Value Ref Range Status  02/25/2023 3.9 3.5 - 5.1 mEq/L Final         Passed - Na in normal range and within 180 days    Sodium  Date Value Ref Range Status  02/25/2023 139 135 - 145 mEq/L Final  09/28/2022 142 134 - 144 mmol/L Final         Passed - eGFR is 30 or above and within 180 days    GFR, Est African American  Date Value Ref Range Status  05/12/2021 96 > OR = 60 mL/min/1.21m2 Final   GFR, Est Non African American  Date Value Ref Range Status  05/12/2021 83 > OR = 60 mL/min/1.1m2 Final   GFR, Estimated  Date Value Ref Range Status  06/07/2022 >60 >60 mL/min Final    Comment:    (NOTE) Calculated using the CKD-EPI Creatinine Equation (2021)    GFR  Date Value Ref Range Status  02/25/2023 80.31 >60.00 mL/min Final  Comment:    Calculated using the CKD-EPI Creatinine Equation (2021)   eGFR  Date Value Ref Range Status  09/28/2022 88 >59 mL/min/1.73 Final         Passed - Last BP in normal range    BP Readings from Last 1 Encounters:  02/25/23 126/76         Passed - Valid encounter within last 6 months    Recent Outpatient Visits           5 months ago Smoking greater than 20 pack years   [facility] Hoy Register, MD   8 months ago Ventral hernia without obstruction or gangrene   [facility] Hoy Register, MD   9 months ago Annual physical exam   [facility] Hoy Register, MD   1 year ago Ventral hernia without obstruction or gangrene   [facility] Hoy Register, MD   1 year ago Essential hypertension   [facility] Hoy Register, MD       Future Appointments             In 6 days Tarry Kos, MD [facility] [place]   In 1 week Hoy Register, MD [facility]

## 2023-03-23 ENCOUNTER — Ambulatory Visit: Payer: Medicaid Other | Admitting: Orthopaedic Surgery

## 2023-03-23 ENCOUNTER — Encounter: Payer: Self-pay | Admitting: Orthopaedic Surgery

## 2023-03-23 ENCOUNTER — Other Ambulatory Visit (INDEPENDENT_AMBULATORY_CARE_PROVIDER_SITE_OTHER): Payer: Medicaid Other

## 2023-03-23 VITALS — Ht 61.0 in | Wt 193.0 lb

## 2023-03-23 DIAGNOSIS — Z7689 Persons encountering health services in other specified circumstances: Secondary | ICD-10-CM | POA: Diagnosis not present

## 2023-03-23 DIAGNOSIS — M1711 Unilateral primary osteoarthritis, right knee: Secondary | ICD-10-CM | POA: Diagnosis not present

## 2023-03-23 NOTE — Progress Notes (Signed)
Office Visit Note   Patient: Samantha Evans           Date of Birth: 02/08/1961           MRN: [id number] Visit Date: 03/23/2023              Requested by: Hoy Register, MD [address] [place],  [place] [address] PCP: Hoy Register, MD   Assessment & Plan: Visit Diagnoses:  1. Primary osteoarthritis of right knee     Plan: Impression is severe right knee degenerative joint disease secondary to Osteoarthritis.  Bone on bone joint space narrowing is seen on radiographs with mild varus alignment.  At this point, conservative treatments fail to provide any significant relief and the pain is severely affecting ADLs and quality of life.  Based on treatment options, the patient has elected to move forward with a knee replacement.  We have discussed the surgical risks that include but are not limited to infection, DVT, leg length discrepancy, stiffness, numbness, tingling, incomplete relief of pain.  Recovery and prognosis were also reviewed.   Debbie to call the patient to confirm surgery time.  Anticoagulants: No antithrombotic Postop anticoagulation: Aspirin 81 mg Diabetic: No  Nickel allergy: No Prior DVT/PE: No Tobacco use: Yes 3 cigarettes a day Clearances needed for surgery: None Anticipated discharge dispo: Home   Follow-Up Instructions: No follow-ups on file.   Orders:  Orders Placed This Encounter  Procedures   XR KNEE 3 VIEW RIGHT   No orders of the defined types were placed in this encounter.     Procedures: No procedures performed   Clinical Data: No additional findings.   Subjective: Chief Complaint  Patient presents with   Right Knee - Pain    HPI Samantha Evans is a pleasant 62 year old female here for surgical consultation for right total knee replacement.  She has advanced DJD of the right knee.  She has had 1 steroid injection without any relief.  She experiences frequent morning stiffness.  Sometimes has nighttime pain that prevents her  from sleeping.  She has been seeing Dr. Cleophas Dunker for a year for this and has tried many conservative treatments. Review of Systems  Constitutional: Negative.   HENT: Negative.    Eyes: Negative.   Respiratory: Negative.    Cardiovascular: Negative.   Endocrine: Negative.   Musculoskeletal: Negative.   Neurological: Negative.   Hematological: Negative.   Psychiatric/Behavioral: Negative.    All other systems reviewed and are negative.    Objective: Vital Signs: Ht 5\' 1"  (1.549 m)   Wt 193 lb (87.5 kg)   BMI 36.47 kg/m   Physical Exam Vitals and nursing note reviewed.  Constitutional:      Appearance: She is well-developed.  HENT:     Head: Normocephalic and atraumatic.     Nose: Nose normal.  Eyes:     Extraocular Movements: Extraocular movements intact.  Cardiovascular:     Pulses: Normal pulses.  Pulmonary:     Effort: Pulmonary effort is normal.  Abdominal:     Palpations: Abdomen is soft.  Musculoskeletal:     Cervical back: Neck supple.  Skin:    General: Skin is warm.     Capillary Refill: Capillary refill takes less than 2 seconds.  Neurological:     Mental Status: She is alert and oriented to person, place, and time. Mental status is at baseline.  Psychiatric:        Behavior: Behavior normal.  Thought Content: Thought content normal.        Judgment: Judgment normal.     Ortho Exam Examination of the right knee shows medial joint line tenderness.  Trace effusion.  Pain and crepitus with range of motion.  Collaterals and cruciates are stable. Specialty Comments:  No specialty comments available.  Imaging: No results found.   PMFS History: Patient Active Problem List   Diagnosis Date Noted   Pancreatic cyst 11/26/2022   Abnormal magnetic resonance cholangiopancreatography (MRCP) 08/27/2022   Papilloma of right breast 07/16/2022   Ventral hernia, recurrent 07/16/2022   Primary osteoarthritis of right knee 06/04/2022   Peripheral edema  05/12/2021   Chronic hepatitis C without hepatic coma (HCC) 03/25/2021   Acid reflux 03/25/2021   Hepatic cirrhosis (HCC)- by CT scan 02/27/2021   AKI (acute kidney injury) (HCC) 02/27/2021   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 updated 10/26/22    Family History  Problem Relation Age of Onset   Other Mother        Hit by a train while in her truck   Lung cancer Father    Liver disease Neg Hx    Colon cancer Neg Hx    Esophageal cancer Neg Hx    Pancreatic cancer Neg Hx    Stomach cancer Neg Hx    Colon polyps Neg Hx    Crohn's disease Neg Hx    Rectal cancer Neg Hx    Ulcerative colitis Neg Hx     Past Surgical History:  Procedure Laterality Date   BREAST BIOPSY WITH RADIO FREQUENCY LOCALIZER Right 08/12/2022   Procedure: BREAST BIOPSY- Office manager;  Surgeon: Campbell Lerner, MD;  Location: [facility] ORS;  Service: General;  Laterality: Right;   CATARACT EXTRACTION Bilateral    CHOLECYSTECTOMY     COLONOSCOPY     ERCP     POLYPECTOMY     UMBILICAL HERNIA REPAIR N/A 02/27/2021   Procedure: REPAIR INCARCERATED UMBILICAL HERNIA;  Surgeon: Violeta Gelinas, MD;  Location: [facility] OR;  Service: General;  Laterality: N/A;   Social History   Occupational History   Occupation: unemployed  Tobacco Use   Smoking status: Every Day    Packs/day: 0.50    Years: 40.00    Additional pack years: 0.00    Total pack years: 20.00    Types: Cigarettes    Passive exposure: Past   Smokeless tobacco: Never   Tobacco comments:    Quit smoking on 02/27/2021 when hernia repair surgery occurred.  Back to smoking .5 pack of cigarettes daily  Vaping Use   Vaping Use: Never used  Substance and Sexual Activity   Alcohol use: Not  Currently   Drug use: Not Currently    Types: "Crack" cocaine    Comment: quit 20 years ago updated 10/26/22   Sexual activity: Not on file

## 2023-03-29 ENCOUNTER — Encounter: Payer: Self-pay | Admitting: Family Medicine

## 2023-03-29 ENCOUNTER — Ambulatory Visit: Payer: Medicaid Other | Attending: Family Medicine | Admitting: Family Medicine

## 2023-03-29 ENCOUNTER — Other Ambulatory Visit: Payer: Self-pay

## 2023-03-29 VITALS — BP 143/81 | HR 78 | Ht 61.0 in | Wt 194.4 lb

## 2023-03-29 DIAGNOSIS — M1711 Unilateral primary osteoarthritis, right knee: Secondary | ICD-10-CM | POA: Insufficient documentation

## 2023-03-29 DIAGNOSIS — I440 Atrioventricular block, first degree: Secondary | ICD-10-CM | POA: Diagnosis not present

## 2023-03-29 DIAGNOSIS — F17210 Nicotine dependence, cigarettes, uncomplicated: Secondary | ICD-10-CM | POA: Diagnosis not present

## 2023-03-29 DIAGNOSIS — Z5986 Financial insecurity: Secondary | ICD-10-CM | POA: Diagnosis not present

## 2023-03-29 DIAGNOSIS — R9431 Abnormal electrocardiogram [ECG] [EKG]: Secondary | ICD-10-CM | POA: Diagnosis not present

## 2023-03-29 DIAGNOSIS — I10 Essential (primary) hypertension: Secondary | ICD-10-CM | POA: Insufficient documentation

## 2023-03-29 DIAGNOSIS — Z79899 Other long term (current) drug therapy: Secondary | ICD-10-CM | POA: Insufficient documentation

## 2023-03-29 DIAGNOSIS — K7460 Unspecified cirrhosis of liver: Secondary | ICD-10-CM | POA: Diagnosis not present

## 2023-03-29 DIAGNOSIS — Z01810 Encounter for preprocedural cardiovascular examination: Secondary | ICD-10-CM | POA: Diagnosis not present

## 2023-03-29 DIAGNOSIS — J302 Other seasonal allergic rhinitis: Secondary | ICD-10-CM

## 2023-03-29 DIAGNOSIS — Z7689 Persons encountering health services in other specified circumstances: Secondary | ICD-10-CM | POA: Diagnosis not present

## 2023-03-29 MED ORDER — CETIRIZINE HCL 10 MG PO TABS
10.0000 mg | ORAL_TABLET | Freq: Every day | ORAL | 1 refills | Status: DC
Start: 2023-03-29 — End: 2023-04-28

## 2023-03-29 MED ORDER — OLOPATADINE HCL 0.1 % OP SOLN
1.0000 [drp] | Freq: Two times a day (BID) | OPHTHALMIC | 6 refills | Status: DC
Start: 2023-03-29 — End: 2023-06-30

## 2023-03-29 NOTE — Progress Notes (Signed)
Nasal Congestion

## 2023-03-29 NOTE — Progress Notes (Signed)
Subjective:  Patient ID: Samantha Evans, female    DOB: 1961/06/05  Age: 62 y.o. MRN: [id number]  CC: Hypertension   [name] is a 62 y.o. year old female with a history of hepatitis C, liver cirrhosis, incarcerated umbilical hernia repair, hypertension, Nicotine dependence (half a ppd x40 years down to 3 Cigarettes/ day)  Interval History:  She is being worked up for right knee osteoarthritis and will need preoperative clearance. She is planning on being discharged to SNF post surgery. She has no chest pain or dyspnea and would normally not have difficulty going up stairs except for her right knee pain.  She did have a visit with general surgery for evaluation of hernia repair last month.  Also saw GI as well for follow-up of hepatic and pancreatic cyst with plan for repeating MRI Abdomen. Her BP is elevated and she endorses adherence with her antihypertensive. Complains of teary eyes, sneezing and thinks it may be due to allergies.  She would like something for this. 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 updated 10/26/22    Past Surgical History:  Procedure Laterality Date   BREAST BIOPSY WITH RADIO FREQUENCY LOCALIZER Right 08/12/2022   Procedure: BREAST BIOPSY- Office manager;  Surgeon: Campbell Lerner, MD;  Location: [facility] ORS;  Service: General;  Laterality: Right;   CATARACT EXTRACTION Bilateral    CHOLECYSTECTOMY     COLONOSCOPY     ERCP     POLYPECTOMY     UMBILICAL HERNIA REPAIR N/A 02/27/2021   Procedure: REPAIR INCARCERATED UMBILICAL HERNIA;  Surgeon: Violeta Gelinas, MD;  Location: [facility] OR;  Service: General;  Laterality: N/A;    Family History  Problem Relation Age of Onset   Other Mother         Hit by a train while in her truck   Lung cancer Father    Liver disease Neg Hx    Colon cancer Neg Hx    Esophageal cancer Neg Hx    Pancreatic cancer Neg Hx    Stomach cancer Neg Hx    Colon polyps Neg Hx    Crohn's disease Neg Hx    Rectal cancer Neg Hx    Ulcerative colitis Neg Hx     Social History   Socioeconomic History   Marital status: Legally Separated    Spouse name: Not on file   Number of children: 4   Years of education: Not on file   Highest education level: 12th grade  Occupational History   Occupation: unemployed  Tobacco Use   Smoking status: Every Day    Packs/day: 0.50    Years: 40.00    Additional pack years: 0.00    Total pack years: 20.00    Types: Cigarettes    Passive exposure: Past   Smokeless tobacco: Never   Tobacco comments:    Quit smoking on 02/27/2021 when hernia repair surgery occurred.  Back to smoking .5 pack of cigarettes daily  Vaping Use   Vaping Use: Never used  Substance and Sexual Activity   Alcohol use: Not Currently   Drug use: Not Currently    Types: "Crack" cocaine    Comment: quit  20 years ago updated 10/26/22   Sexual activity: Not on file  Other Topics Concern   Not on file  Social History Narrative   Not on file   Social Determinants of Health   Financial Resource Strain: Medium Risk (03/25/2023)   Overall Financial Resource Strain (CARDIA)    Difficulty of Paying Living Expenses: Somewhat hard  Food Insecurity: Food Insecurity Present (03/25/2023)   Hunger Vital Sign    Worried About Running Out of Food in the Last Year: Often true    Ran Out of Food in the Last Year: Often true  Transportation Needs: No Transportation Needs (03/25/2023)   PRAPARE - Administrator, Civil Service (Medical): No    Lack of Transportation (Non-Medical): No  Physical Activity: Unknown (03/25/2023)   Exercise Vital Sign    Days of Exercise per Week: Patient declined    Minutes of Exercise per Session: Not on file  Stress:  No Stress Concern Present (03/25/2023)   [employer] of Occupational Health - Occupational Stress Questionnaire    Feeling of Stress : Only a little  Social Connections: Unknown (03/25/2023)   Social Connection and Isolation Panel [NHANES]    Frequency of Communication with Friends and Family: Three times a week    Frequency of Social Gatherings with Friends and Family: Three times a week    Attends Religious Services: Patient declined    Active Member of Clubs or Organizations: No    Attends Engineer, structural: Not on file    Marital Status: Separated    No Known Allergies  Outpatient Medications Prior to Visit  Medication Sig Dispense Refill   amLODipine (NORVASC) 5 MG tablet Take 1 tablet (5 mg total) by mouth daily. 90 tablet 0   spironolactone (ALDACTONE) 50 MG tablet Take 1 tablet (50 mg total) by mouth daily. 90 tablet 0   buPROPion (WELLBUTRIN XL) 150 MG 24 hr tablet TAKE 1 TABLET (150 MG TOTAL) BY MOUTH DAILY. FOR SMOKING CESSATION 90 tablet 1   CALCIUM CITRATE PO Take 1 tablet by mouth See admin instructions. Take 1 tablet by mouth every 3-4 days. (Patient not taking: Reported on 03/29/2023)     HYDROcodone-acetaminophen (NORCO/VICODIN) 5-325 MG tablet Take 1 tablet by mouth every 6 (six) hours as needed for moderate pain. (Patient not taking: Reported on 03/29/2023) 15 tablet 0   ibuprofen (ADVIL) 200 MG tablet Take 400 mg by mouth daily as needed for headache. (Patient not taking: Reported on 03/29/2023)     magnesium hydroxide (MILK OF MAGNESIA) 400 MG/5ML suspension Take by mouth daily as needed for mild constipation. (Patient not taking: Reported on 03/29/2023)     Misc. Devices MISC BP monitor (Patient not taking: Reported on 03/29/2023) 1 each 0   Psyllium (METAMUCIL PO) Take 16 oz by mouth See admin instructions. Take 16 oz by mouth every 2 days. (Patient not taking: Reported on 03/29/2023)     No facility-administered medications prior to visit.      ROS Review of Systems  Constitutional:  Negative for activity change and appetite change.  HENT:  Negative for sinus pressure and sore throat.   Respiratory:  Negative for chest tightness, shortness of breath and wheezing.   Cardiovascular:  Negative for chest pain and palpitations.  Gastrointestinal:  Negative for abdominal distention, abdominal pain and constipation.  Genitourinary: Negative.   Musculoskeletal:        See HPI  Psychiatric/Behavioral:  Negative for behavioral problems and dysphoric mood.  Objective:  BP (!) 143/81   Pulse 78   Ht 5\' 1"  (1.549 m)   Wt 194 lb 6.4 oz (88.2 kg)   SpO2 96%   BMI 36.73 kg/m      03/29/2023    3:49 PM 03/29/2023    2:49 PM 03/23/2023    3:17 PM  BP/Weight  Systolic BP 143 159   Diastolic BP 81 85   Wt. (Lbs)  194.4 193  BMI  36.73 kg/m2 36.47 kg/m2      Physical Exam Constitutional:      Appearance: She is well-developed.  Cardiovascular:     Rate and Rhythm: Normal rate.     Heart sounds: Normal heart sounds. No murmur heard. Pulmonary:     Effort: Pulmonary effort is normal.     Breath sounds: Normal breath sounds. No wheezing or rales.  Chest:     Chest wall: No tenderness.  Abdominal:     General: Bowel sounds are normal. There is no distension.     Palpations: Abdomen is soft. There is no mass.     Tenderness: There is no abdominal tenderness.  Musculoskeletal:        General: Normal range of motion.     Right lower leg: No edema.     Left lower leg: No edema.  Neurological:     Mental Status: She is alert and oriented to person, place, and time.  Psychiatric:        Mood and Affect: Mood normal.        Latest Ref Rng & Units 02/25/2023    2:58 PM 09/28/2022    2:44 PM 06/23/2022   11:32 AM  CMP  Glucose 70 - 99 mg/dL 161  81    BUN 6 - 23 mg/dL 16  17    Creatinine 0.96 - 1.20 mg/dL 0.45  4.09    Sodium 811 - 145 mEq/L 139  142    Potassium 3.5 - 5.1 mEq/L 3.9  4.1    Chloride 96 - 112  mEq/L 107  105    CO2 19 - 32 mEq/L 26  23    Calcium 8.4 - 10.5 mg/dL 91.4  78.2  95.6   Total Protein 6.0 - 8.3 g/dL 7.2  7.3    Total Bilirubin 0.2 - 1.2 mg/dL 0.8  0.8    Alkaline Phos 39 - 117 U/L 89  121    AST 0 - 37 U/L 21  30    ALT 0 - 35 U/L 12  11      Lipid Panel     Component Value Date/Time   CHOL 144 05/26/2022 1128   TRIG 98 05/26/2022 1128   HDL 40 05/26/2022 1128   LDLCALC 86 05/26/2022 1128    CBC    Component Value Date/Time   WBC 4.5 02/25/2023 1458   RBC 4.45 02/25/2023 1458   HGB 14.3 02/25/2023 1458   HGB 12.5 05/26/2022 1128   HCT 40.0 02/25/2023 1458   HCT 35.9 05/26/2022 1128   PLT 109.0 (L) 02/25/2023 1458   PLT 130 (L) 05/26/2022 1128   MCV 89.8 02/25/2023 1458   MCV 85 05/26/2022 1128   MCH 29.5 06/07/2022 2125   MCHC 35.7 02/25/2023 1458   RDW 14.2 02/25/2023 1458   RDW 14.5 05/26/2022 1128   LYMPHSABS 1.0 02/25/2023 1458   LYMPHSABS 1.1 05/26/2022 1128   MONOABS 0.3 02/25/2023 1458   EOSABS 0.2 02/25/2023 1458   EOSABS 0.1 05/26/2022 1128  BASOSABS 0.0 02/25/2023 1458   BASOSABS 0.0 05/26/2022 1128    Lab Results  Component Value Date   HGBA1C 4.8 05/26/2022    Assessment & Plan:  1. Essential hypertension Slightly above goal No regimen change today Counseled on blood pressure goal of less than 130/80, low-sodium, DASH diet, medication compliance, 150 minutes of moderate intensity exercise per week. Discussed medication compliance, adverse effects.   2. Smoking greater than 20 pack years Spent 3 minutes counseling on smoking cessation and she is not yet ready to quit I had previously referred her for CT chest lung cancer screening 10 months ago but she never followed through with this - CT CHEST LUNG CANCER SCREENING LOW DOSE WO CONTRAST; Future  3. Abnormal EKG EKG revealed first-degree AV block, possible septal infarct Will have her evaluated by cardiology in the event that she needs additional stress testing -  Ambulatory referral to Cardiology  4. Preoperative cardiovascular examination See #4 above - Ambulatory referral to Cardiology  5. Seasonal allergies - cetirizine (ZYRTEC) 10 MG tablet; Take 1 tablet (10 mg total) by mouth daily.  Dispense: 30 tablet; Refill: 1 - olopatadine (PATANOL) 0.1 % ophthalmic solution; Place 1 drop into both eyes 2 (two) times daily.  Dispense: 5 mL; Refill: 6  6. Hypercalcemia Referred to endocrinology last year due to hypercalcemia and referral notes indicate she had said she had an appointment with endocrine but on further questioning today she denies this. I have provided her the number to endocrine so she can call to schedule an appointment.    Meds ordered this encounter  Medications   cetirizine (ZYRTEC) 10 MG tablet    Sig: Take 1 tablet (10 mg total) by mouth daily.    Dispense:  30 tablet    Refill:  1   olopatadine (PATANOL) 0.1 % ophthalmic solution    Sig: Place 1 drop into both eyes 2 (two) times daily.    Dispense:  5 mL    Refill:  6    Follow-up: Return in about 3 months (around 06/29/2023) for Chronic medical conditions.       Hoy Register, MD, FAAFP. [facility] [place], [place] [phone]   03/29/2023, 5:10 PM

## 2023-03-31 ENCOUNTER — Ambulatory Visit
Admission: RE | Admit: 2023-03-31 | Discharge: 2023-03-31 | Disposition: A | Discharge status: Home / Self Care | Payer: Medicaid Other | Source: Ambulatory Visit | Attending: Surgery | Admitting: Surgery

## 2023-03-31 DIAGNOSIS — K862 Cyst of pancreas: Secondary | ICD-10-CM | POA: Insufficient documentation

## 2023-03-31 DIAGNOSIS — K7460 Unspecified cirrhosis of liver: Secondary | ICD-10-CM | POA: Insufficient documentation

## 2023-03-31 DIAGNOSIS — Z7689 Persons encountering health services in other specified circumstances: Secondary | ICD-10-CM | POA: Diagnosis not present

## 2023-03-31 DIAGNOSIS — K8689 Other specified diseases of pancreas: Secondary | ICD-10-CM | POA: Diagnosis not present

## 2023-03-31 MED ORDER — GADOBUTROL 1 MMOL/ML IV SOLN
9.0000 mL | Freq: Once | INTRAVENOUS | Status: AC | PRN
  Administered 2023-03-31: 9 mL via INTRAVENOUS

## 2023-04-14 ENCOUNTER — Ambulatory Visit (HOSPITAL_COMMUNITY): Payer: Medicaid Other

## 2023-04-17 ENCOUNTER — Ambulatory Visit (HOSPITAL_BASED_OUTPATIENT_CLINIC_OR_DEPARTMENT_OTHER): Payer: Medicaid Other | Admitting: Physical Therapy

## 2023-04-17 DIAGNOSIS — Z419 Encounter for procedure for purposes other than remedying health state, unspecified: Secondary | ICD-10-CM | POA: Diagnosis not present

## 2023-04-20 ENCOUNTER — Encounter: Payer: Self-pay | Admitting: Family Medicine

## 2023-04-22 ENCOUNTER — Other Ambulatory Visit: Payer: Self-pay

## 2023-04-23 ENCOUNTER — Other Ambulatory Visit (INDEPENDENT_AMBULATORY_CARE_PROVIDER_SITE_OTHER): Payer: Medicaid Other

## 2023-04-23 DIAGNOSIS — Z7689 Persons encountering health services in other specified circumstances: Secondary | ICD-10-CM | POA: Diagnosis not present

## 2023-04-23 NOTE — Addendum Note (Signed)
Addended by: Clearnce Sorrel on: 04/23/2023 02:58 PM   Modules accepted: Orders

## 2023-04-23 NOTE — Addendum Note (Signed)
Addended by: Clearnce Sorrel on: 04/23/2023 02:57 PM   Modules accepted: Orders

## 2023-04-24 LAB — MAGNESIUM: Magnesium: 1.6 mg/dL (ref 1.5–2.5)

## 2023-04-24 LAB — RENAL FUNCTION PANEL
BUN: 20 mg/dL (ref 7–25)
CO2: 24 mmol/L (ref 20–32)
Glucose, Bld: 114 mg/dL — ABNORMAL HIGH (ref 65–99)

## 2023-04-26 LAB — PTH, INTACT AND CALCIUM
Calcium: 11.1 mg/dL — ABNORMAL HIGH (ref 8.6–10.4)
PTH: 47 pg/mL (ref 16–77)

## 2023-04-26 LAB — RENAL FUNCTION PANEL
Albumin: 4.2 g/dL (ref 3.6–5.1)
Calcium: 11.1 mg/dL — ABNORMAL HIGH (ref 8.6–10.4)
Chloride: 109 mmol/L (ref 98–110)
Creat: 0.96 mg/dL (ref 0.50–1.05)
Phosphorus: 2.8 mg/dL (ref 2.5–4.5)
Potassium: 4.1 mmol/L (ref 3.5–5.3)
Sodium: 139 mmol/L (ref 135–146)

## 2023-04-26 LAB — VITAMIN D 25 HYDROXY (VIT D DEFICIENCY, FRACTURES): Vit D, 25-Hydroxy: 24 ng/mL — ABNORMAL LOW (ref 30–100)

## 2023-04-26 LAB — VITAMIN D 1,25 DIHYDROXY
Vitamin D 1, 25 (OH)2 Total: 23 pg/mL (ref 18–72)
Vitamin D2 1, 25 (OH)2: 10 pg/mL
Vitamin D3 1, 25 (OH)2: 13 pg/mL

## 2023-04-27 ENCOUNTER — Encounter: Payer: Self-pay | Admitting: "Endocrinology

## 2023-04-27 ENCOUNTER — Ambulatory Visit: Payer: Medicaid Other | Admitting: "Endocrinology

## 2023-04-27 DIAGNOSIS — Z7689 Persons encountering health services in other specified circumstances: Secondary | ICD-10-CM | POA: Diagnosis not present

## 2023-04-27 DIAGNOSIS — R7989 Other specified abnormal findings of blood chemistry: Secondary | ICD-10-CM

## 2023-04-27 NOTE — Progress Notes (Signed)
Outpatient Endocrinology Note Samantha [place], MD    Roselind Desantos Dobberstein 02-10-1961 [id number]  Referring Provider: Hoy Register, MD Primary Care Provider: Hoy Register, MD Reason for consultation: Subjective   Assessment & Plan  Diagnoses and all orders for this visit:  Hypercalcemia -     Calcium, 24-Hour Urine with Creatinine; Future -     DG Bone Density; Future  Low vitamin D level   Patient referred for hypercalcemia The earliest labs in the system are from 02/2021, at which point patient's calcium was stated elevated at 11.2 corrected Most recent labs done on 04/23/2023 showed calcium elevated at 11.2, with normal PTH, low normal 25 vitamin D at 24, normal 1,25 vitamin D, normal phosphorus and magnesium borderline low at 1.6 Patient restarted taking calcium citrate 2 days ago after a long pause, instructed to stop it No history of osteoporosis/kidney stones/low GFR/osteoporotic fractures Recommend DEXA scan as well as 24-hour urine calcium collection after 2 weeks, if normal will continue to monitor patient every 6 months for calcium Advised adequate hydration  Return in about 15 weeks (around 08/10/2023) for visit, pick up container for 24 hour urine today.   I have reviewed current medications, nurse's notes, allergies, vital signs, past medical and surgical history, family medical history, and social history for this encounter. Counseled patient on symptoms, examination findings, lab findings, imaging results, treatment decisions and monitoring and prognosis. The patient understood the recommendations and agrees with the treatment plan. All questions regarding treatment plan were fully answered.  Samantha [place], MD  04/27/23   History of Present Illness HPI  Samantha OLLER is a 62 y.o. year old female who presents for evaluation of hypercalcemia The earliest labs in the system are from 02/2021, at which point patient's calcium was stated elevated at 11.2  corrected Most recent labs done on 04/23/2023 showed calcium elevated at 11.2, with normal PTH, low normal 25 vitamin D at 24, normal 1,25 vitamin D, normal phosphorus and magnesium borderline low at 1.6  Patient denies a history of kidney stones She  current hematuria No polyuria Yes nocturia No thirst Yes renal failure No anorexia Yes, some abdominal pain Yes, from hernia  heartburn No constipation Yes, sometimes nausea or vomiting No history of peptic ulcer disease No depression No confusion No excessive fatigue Yes fracture No osteoporosis No headaches No numbness No tingling No  She takes Calcium Yes: calcium citrate everyday  She takes Vitamin D supplements No  She denies a history of taking chronic lithium  She denies a recent history of thiazide diuretic intake   She denies family history of renal stones/hypercalcemia denies a personal history of MEN syndromes/medullary thyroid cancer/ pheochromocytoma  Physical Exam  BP 130/70 (BP Location: Left Arm)   Pulse 81   Ht 5\' 1"  (1.549 m)   Wt 191 lb 12.8 oz (87 kg)   SpO2 93%   BMI 36.24 kg/m    Constitutional: well developed, well nourished Head: normocephalic, atraumatic Eyes: sclera anicteric, no redness Neck: supple Lungs: normal respiratory effort Neurology: alert and oriented Skin: dry, no appreciable rashes Musculoskeletal: no appreciable defects Psychiatric: normal mood and affect   Current Medications Patient's Medications  New Prescriptions   No medications on file  Previous Medications   AMLODIPINE (NORVASC) 5 MG TABLET    Take 1 tablet (5 mg total) by mouth daily.   BUPROPION (WELLBUTRIN XL) 150 MG 24 HR TABLET    TAKE 1 TABLET (150 MG TOTAL) BY MOUTH DAILY.  FOR SMOKING CESSATION   CETIRIZINE (ZYRTEC) 10 MG TABLET    Take 1 tablet (10 mg total) by mouth daily.   HYDROCODONE-ACETAMINOPHEN (NORCO/VICODIN) 5-325 MG TABLET    Take 1 tablet by mouth every 6 (six) hours as needed for moderate pain.    IBUPROFEN (ADVIL) 200 MG TABLET    Take 400 mg by mouth daily as needed for headache.   MAGNESIUM HYDROXIDE (MILK OF MAGNESIA) 400 MG/5ML SUSPENSION    Take by mouth daily as needed for mild constipation.   MISC. DEVICES MISC    BP monitor   OLOPATADINE (PATANOL) 0.1 % OPHTHALMIC SOLUTION    Place 1 drop into both eyes 2 (two) times daily.   PSYLLIUM (METAMUCIL PO)    Take 16 oz by mouth See admin instructions. Take 16 oz by mouth every 2 days.   SPIRONOLACTONE (ALDACTONE) 50 MG TABLET    Take 1 tablet (50 mg total) by mouth daily.  Modified Medications   No medications on file  Discontinued Medications   CALCIUM CITRATE PO    Take 1 tablet by mouth See admin instructions. Take 1 tablet by mouth every 3-4 days.    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 updated 10/26/22    Past Surgical History Past Surgical History:  Procedure Laterality Date   BREAST BIOPSY WITH RADIO FREQUENCY LOCALIZER Right 08/12/2022   Procedure: BREAST BIOPSY- Office manager;  Surgeon: Campbell Lerner, MD;  Location: [facility] ORS;  Service: General;  Laterality: Right;   CATARACT EXTRACTION Bilateral    CHOLECYSTECTOMY     COLONOSCOPY     ERCP     POLYPECTOMY     UMBILICAL HERNIA REPAIR N/A 02/27/2021   Procedure: REPAIR INCARCERATED UMBILICAL HERNIA;  Surgeon: Violeta Gelinas, MD;  Location: [facility] OR;  Service: General;  Laterality: N/A;    Family History family history includes Lung cancer in her father; Other in her mother.  Social History Social History   Socioeconomic History   Marital status: Legally Separated    Spouse name: Not on file   Number of children: 4   Years of education:  Not on file   Highest education level: 12th grade  Occupational History   Occupation: unemployed  Tobacco Use   Smoking status: Every Day    Packs/day: 0.50    Years: 40.00    Additional pack years: 0.00    Total pack years: 20.00    Types: Cigarettes    Passive exposure: Past   Smokeless tobacco: Never   Tobacco comments:    Quit smoking on 02/27/2021 when hernia repair surgery occurred.  Back to smoking .5 pack of cigarettes daily  Vaping Use   Vaping Use: Never used  Substance and Sexual Activity   Alcohol use: Not Currently   Drug use: Not Currently    Types: "Crack" cocaine    Comment: quit 20 years ago updated 10/26/22   Sexual activity: Not on file  Other Topics Concern   Not on file  Social History Narrative   Not on file   Social Determinants of Health   Financial Resource Strain: Medium Risk (03/25/2023)  Overall Financial Resource Strain (CARDIA)    Difficulty of Paying Living Expenses: Somewhat hard  Food Insecurity: Food Insecurity Present (03/25/2023)   Hunger Vital Sign    Worried About Running Out of Food in the Last Year: Often true    Ran Out of Food in the Last Year: Often true  Transportation Needs: No Transportation Needs (03/25/2023)   PRAPARE - Administrator, Civil Service (Medical): No    Lack of Transportation (Non-Medical): No  Physical Activity: Unknown (03/25/2023)   Exercise Vital Sign    Days of Exercise per Week: Patient declined    Minutes of Exercise per Session: Not on file  Stress: No Stress Concern Present (03/25/2023)   [employer] of Occupational Health - Occupational Stress Questionnaire    Feeling of Stress : Only a little  Social Connections: Unknown (03/25/2023)   Social Connection and Isolation Panel [NHANES]    Frequency of Communication with Friends and Family: Three times a week    Frequency of Social Gatherings with Friends and Family: Three times a week    Attends Religious Services: Patient declined    Active  Member of Clubs or Organizations: No    Attends Banker Meetings: Not on file    Marital Status: Separated  Intimate Partner Violence: Not on file    Lab Results  Component Value Date   CHOL 144 05/26/2022   Lab Results  Component Value Date   HDL 40 05/26/2022   Lab Results  Component Value Date   LDLCALC 86 05/26/2022   Lab Results  Component Value Date   TRIG 98 05/26/2022   No results found for: "CHOLHDL" Lab Results  Component Value Date   CREATININE 0.96 04/23/2023   Lab Results  Component Value Date   GFR 80.31 02/25/2023      Component Value Date/Time   NA 139 04/23/2023 1458   NA 142 09/28/2022 1444   K 4.1 04/23/2023 1458   CL 109 04/23/2023 1458   CO2 24 04/23/2023 1458   GLUCOSE 114 (H) 04/23/2023 1458   BUN 20 04/23/2023 1458   BUN 17 09/28/2022 1444   CREATININE 0.96 04/23/2023 1458   CALCIUM 11.1 (H) 04/23/2023 1458   CALCIUM 11.1 (H) 04/23/2023 1458   PROT 7.2 02/25/2023 1458   PROT 7.3 09/28/2022 1444   ALBUMIN 4.1 02/25/2023 1458   ALBUMIN 4.1 09/28/2022 1444   AST 21 02/25/2023 1458   ALT 12 02/25/2023 1458   ALKPHOS 89 02/25/2023 1458   BILITOT 0.8 02/25/2023 1458   BILITOT 0.8 09/28/2022 1444   GFRNONAA >60 06/07/2022 2125   GFRNONAA 83 05/12/2021 1415   GFRAA 96 05/12/2021 1415      Latest Ref Rng & Units 04/23/2023    2:58 PM 02/25/2023    2:58 PM 09/28/2022    2:44 PM  BMP  Glucose 65 - 99 mg/dL 086  578  81   BUN 7 - 25 mg/dL 20  16  17    Creatinine 0.50 - 1.05 mg/dL 4.69  6.29  5.28   BUN/Creat Ratio 6 - 22 (calc) SEE NOTE:   22   Sodium 135 - 146 mmol/L 139  139  142   Potassium 3.5 - 5.3 mmol/L 4.1  3.9  4.1   Chloride 98 - 110 mmol/L 109  107  105   CO2 20 - 32 mmol/L 24  26  23    Calcium 8.6 - 10.4 mg/dL 8.6 - 41.3 mg/dL 11.1  11.1  11.2  11.0        Component Value Date/Time   WBC 4.5 02/25/2023 1458   RBC 4.45 02/25/2023 1458   HGB 14.3 02/25/2023 1458   HGB 12.5 05/26/2022 1128   HCT 40.0  02/25/2023 1458   HCT 35.9 05/26/2022 1128   PLT 109.0 (L) 02/25/2023 1458   PLT 130 (L) 05/26/2022 1128   MCV 89.8 02/25/2023 1458   MCV 85 05/26/2022 1128   MCH 29.5 06/07/2022 2125   MCHC 35.7 02/25/2023 1458   RDW 14.2 02/25/2023 1458   RDW 14.5 05/26/2022 1128   LYMPHSABS 1.0 02/25/2023 1458   LYMPHSABS 1.1 05/26/2022 1128   MONOABS 0.3 02/25/2023 1458   EOSABS 0.2 02/25/2023 1458   EOSABS 0.1 05/26/2022 1128   BASOSABS 0.0 02/25/2023 1458   BASOSABS 0.0 05/26/2022 1128   No results found for: "TSH", "FREET4"       Parts of this note may have been dictated using voice recognition software. There may be variances in spelling and vocabulary which are unintentional. Not all errors are proofread. Please notify the Thereasa Parkin if any discrepancies are noted or if the meaning of any statement is not clear.

## 2023-04-27 NOTE — Patient Instructions (Addendum)
-  Stop calcium citrate  -Start magnesium 400 mg once a day [employer])  -labs in 2 weeks  - Discussed importance of good hydration to prevent severe hypercalcemia:  - 8 eight ounce glasses of fluids per day.  - low threshold for ER visit for IV fluids if having     nausea/vomiting/diarrhea and cannot keep well hydrated.

## 2023-04-28 ENCOUNTER — Ambulatory Visit: Payer: Medicaid Other | Admitting: Physician Assistant

## 2023-04-28 ENCOUNTER — Other Ambulatory Visit: Payer: Self-pay | Admitting: Family Medicine

## 2023-04-28 DIAGNOSIS — J302 Other seasonal allergic rhinitis: Secondary | ICD-10-CM

## 2023-04-30 ENCOUNTER — Ambulatory Visit
Admission: RE | Admit: 2023-04-30 | Discharge: 2023-04-30 | Disposition: A | Discharge status: Home / Self Care | Payer: Medicaid Other | Source: Ambulatory Visit | Attending: Family Medicine | Admitting: Family Medicine

## 2023-04-30 DIAGNOSIS — F17210 Nicotine dependence, cigarettes, uncomplicated: Secondary | ICD-10-CM

## 2023-05-06 ENCOUNTER — Other Ambulatory Visit: Payer: Self-pay | Admitting: Family Medicine

## 2023-05-06 DIAGNOSIS — R911 Solitary pulmonary nodule: Secondary | ICD-10-CM

## 2023-05-11 ENCOUNTER — Other Ambulatory Visit: Payer: Self-pay

## 2023-05-11 DIAGNOSIS — D241 Benign neoplasm of right breast: Secondary | ICD-10-CM

## 2023-05-13 ENCOUNTER — Other Ambulatory Visit: Payer: Self-pay

## 2023-05-13 DIAGNOSIS — D241 Benign neoplasm of right breast: Secondary | ICD-10-CM

## 2023-05-17 ENCOUNTER — Telehealth: Payer: Self-pay | Admitting: Family Medicine

## 2023-05-17 DIAGNOSIS — Z419 Encounter for procedure for purposes other than remedying health state, unspecified: Secondary | ICD-10-CM | POA: Diagnosis not present

## 2023-05-17 NOTE — Telephone Encounter (Signed)
Patient Scheduled for VV on 05/18/2023 to Discuss incont. supplies

## 2023-05-17 NOTE — Telephone Encounter (Signed)
Chris with [facility] is calling in because they wanted to fax over a prescription for the pt's incontinence supplies but the script requires two diagnosis and they only have one. Thayer Ohm is requesting to speak with a nurse or someone who can give him another diagnosis for the pt so they can continue with the process. Please advise. Callback: [phone].

## 2023-05-18 ENCOUNTER — Ambulatory Visit: Payer: Medicaid Other | Attending: Family Medicine | Admitting: Family Medicine

## 2023-05-18 DIAGNOSIS — N3941 Urge incontinence: Secondary | ICD-10-CM

## 2023-05-18 MED ORDER — MISC. DEVICES MISC
12 refills | Status: DC
Start: 2023-05-18 — End: 2023-07-06

## 2023-05-18 NOTE — Progress Notes (Signed)
Virtual Visit via Telephone Note  I connected with Samantha Evans, on 05/18/2023 at 8:18 AM by telephone and verified that I am speaking with the correct person using two identifiers.   Consent: I discussed the limitations, risks, security and privacy concerns of performing an evaluation and management service by telephone and the availability of in person appointments. I also discussed with the patient that there may be a patient responsible charge related to this service. The patient expressed understanding and agreed to proceed.   Location of Patient: Home  Location of Provider: Clinic   Persons participating in Telemedicine visit: [place] W Prochazka Dr. Alvis Lemmings     History of Present Illness: Samantha Evans is a 62 y.o. year old female with  a history of hepatitis C, liver cirrhosis, incarcerated umbilical hernia repair, hypertension, Nicotine dependence (half a ppd x40 years down to 3 Cigarettes/ day), osteoarthritis of the knees.  She Complains of urge incontinence and having accidents when trying to get to the bathroom.  The fact that she also needs knee replacements makes it difficult for her to get to the bathroom. She is requesting depends due to her urge incontinence.   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 90 tablet 0   buPROPion (WELLBUTRIN XL) 150 MG 24 hr tablet TAKE 1 TABLET (150 MG TOTAL) BY MOUTH DAILY. FOR SMOKING CESSATION 90 tablet 1   cetirizine (ZYRTEC) 10 MG tablet TAKE 1 TABLET BY MOUTH EVERY  DAY 90 tablet 1   HYDROcodone-acetaminophen (NORCO/VICODIN) 5-325 MG tablet Take 1 tablet by mouth every 6 (six) hours as needed for moderate pain. (Patient not taking: Reported on 03/29/2023) 15 tablet 0   ibuprofen (ADVIL) 200 MG tablet Take 400 mg by mouth daily as needed for headache. (Patient not taking: Reported on 03/29/2023)     magnesium hydroxide (MILK OF MAGNESIA) 400 MG/5ML suspension Take by mouth daily as needed for mild constipation. (Patient not taking: Reported on 03/29/2023)     Misc. Devices MISC BP monitor (Patient not taking: Reported on 03/29/2023) 1 each 0   olopatadine (PATANOL) 0.1 % ophthalmic solution Place 1 drop into both eyes 2 (two) times daily. 5 mL 6   Psyllium (METAMUCIL PO) Take 16 oz by mouth See admin instructions. Take 16 oz by mouth every 2 days. (Patient not taking: Reported on 03/29/2023)     spironolactone (ALDACTONE) 50 MG tablet Take 1 tablet (50 mg total) by mouth daily. 90 tablet 0   No current facility-administered medications on file prior to visit.    ROS: See HPI   Observations/Objective: Awake, alert, oriented x3 Not in acute distress Normal mood      Latest Ref Rng & Units 04/23/2023    2:58 PM 02/25/2023    2:58 PM 09/28/2022    2:44 PM  CMP  Glucose 65 - 99 mg/dL 829  562  81   BUN 7 - 25 mg/dL 20  16  17  Creatinine 0.50 - 1.05 mg/dL 1.61  0.96  0.45   Sodium 135 - 146 mmol/L 139  139  142   Potassium 3.5 - 5.3 mmol/L 4.1  3.9  4.1   Chloride 98 - 110 mmol/L 109  107  105   CO2 20 - 32 mmol/L 24  26  23    Calcium 8.6 - 10.4 mg/dL 8.6 - 40.9 mg/dL 81.1    91.4  78.2  95.6   Total Protein 6.0 - 8.3 g/dL  7.2  7.3   Total Bilirubin 0.2 - 1.2 mg/dL  0.8  0.8   Alkaline Phos 39 - 117 U/L  89  121   AST 0 - 37 U/L  21  30   ALT 0 - 35 U/L  12  11     Lipid Panel     Component Value Date/Time   CHOL 144 05/26/2022 1128   TRIG 98 05/26/2022 1128   HDL 40 05/26/2022 1128   LDLCALC 86 05/26/2022 1128   LABVLDL 18 05/26/2022  1128    Lab Results  Component Value Date   HGBA1C 4.8 05/26/2022    Assessment and Plan: 1. Urge incontinence of urine With associated urinary accidents This is also worsened by underlying osteoarthritis of both knees meeting criteria for knee replacement - Misc. Devices MISC; Depends XL. Diagnosis - urinary incontinence  Dispense: 1 Box; Refill: 12   Follow Up Instructions: Keep previously scheduled appointment   I discussed the assessment and treatment plan with the patient. The patient was provided an opportunity to ask questions and all were answered. The patient agreed with the plan and demonstrated an understanding of the instructions.   The patient was advised to call back or seek an in-person evaluation if the symptoms worsen or if the condition fails to improve as anticipated.     I provided 11 minutes total of non-face-to-face time during this encounter.   Hoy Register, MD, FAAFP. [facility] [place], [place] [phone]   05/18/2023, 8:18 AM

## 2023-05-19 DIAGNOSIS — R32 Unspecified urinary incontinence: Secondary | ICD-10-CM | POA: Diagnosis not present

## 2023-05-26 ENCOUNTER — Telehealth: Payer: Self-pay

## 2023-05-26 NOTE — Telephone Encounter (Signed)
Message left reminding patient of future appointment with [facility].  Request patient to call office with any cardiac history.  If no history, no need to return call.  

## 2023-05-27 NOTE — Telephone Encounter (Signed)
FYI--Patient returned call stating that she has no cardiac history.

## 2023-06-03 ENCOUNTER — Ambulatory Visit: Payer: Medicaid Other | Admitting: Cardiology

## 2023-06-04 ENCOUNTER — Encounter: Payer: Self-pay | Admitting: Cardiology

## 2023-06-04 ENCOUNTER — Ambulatory Visit: Payer: Medicaid Other | Attending: Cardiology | Admitting: Cardiology

## 2023-06-04 VITALS — BP 148/84 | HR 79 | Ht 61.0 in | Wt 190.6 lb

## 2023-06-04 DIAGNOSIS — F17200 Nicotine dependence, unspecified, uncomplicated: Secondary | ICD-10-CM

## 2023-06-04 DIAGNOSIS — I10 Essential (primary) hypertension: Secondary | ICD-10-CM

## 2023-06-04 DIAGNOSIS — Z01810 Encounter for preprocedural cardiovascular examination: Secondary | ICD-10-CM

## 2023-06-04 DIAGNOSIS — R9431 Abnormal electrocardiogram [ECG] [EKG]: Secondary | ICD-10-CM | POA: Diagnosis not present

## 2023-06-04 DIAGNOSIS — Z7689 Persons encountering health services in other specified circumstances: Secondary | ICD-10-CM | POA: Diagnosis not present

## 2023-06-04 NOTE — Progress Notes (Signed)
Cardiology Office Note:    Date:  06/04/2023   ID:  Samantha Evans, DOB November 19, 1960, MRN [id number]  PCP:  Hoy Register, MD   [facility] Providers Cardiologist:  Debbe Odea, MD {    Referring MD: Hoy Register, MD   Chief Complaint  Patient presents with   New Patient (Initial Visit)    Referred for Abnormal EKG needing cardiac clearance of knee surgery scheduled in 08/2023   History of Present Illness:    Samantha Evans is a 62 y.o. female with a hx of HTN, current smoker x 40+ years, arthritis who presents for pre op eval.  Patient has right knee pain from osteoarthritis. Surgical intervention is being planned. Pre op EKG showed possible old septal infarct. She denies chest pain or shortness of breath. Father had CABG in his 7s. Doesn't check bp at home frequently.   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 updated 10/26/22    Past Surgical History:  Procedure Laterality Date   BREAST BIOPSY WITH RADIO FREQUENCY LOCALIZER Right 08/12/2022   Procedure: BREAST BIOPSY- Office manager;  Surgeon: Campbell Lerner, MD;  Location: [facility] ORS;  Service: General;  Laterality: Right;   CATARACT EXTRACTION Bilateral    CHOLECYSTECTOMY     COLONOSCOPY     ERCP     POLYPECTOMY     UMBILICAL HERNIA REPAIR N/A 02/27/2021   Procedure: REPAIR INCARCERATED UMBILICAL HERNIA;  Surgeon: Violeta Gelinas, MD;  Location: [facility] OR;  Service: General;  Laterality: N/A;    Current Medications: Current Meds  Medication Sig   amLODipine (NORVASC) 5 MG tablet Take 1 tablet (5 mg total) by mouth daily.   Misc. Devices MISC Depends XL. Diagnosis - urinary incontinence   olopatadine (PATANOL) 0.1 % ophthalmic solution  Place 1 drop into both eyes 2 (two) times daily.   spironolactone (ALDACTONE) 50 MG tablet Take 1 tablet (50 mg total) by mouth daily.     Allergies:   Patient has no known allergies.   Social History   Socioeconomic History   Marital status: Legally Separated    Spouse name: Not on file   Number of children: 4   Years of education: Not on file   Highest education level: 12th grade  Occupational History   Occupation: unemployed  Tobacco Use   Smoking status: Every Day    Current packs/day: 0.50    Average packs/day: 0.5 packs/day for 40.0 years (20.0 ttl pk-yrs)    Types: Cigarettes    Passive exposure: Past   Smokeless tobacco: Never   Tobacco comments:    Quit smoking on 02/27/2021 when hernia repair surgery occurred.  Back to smoking .5 pack of cigarettes daily  Vaping Use   Vaping status: Never Used  Substance and Sexual Activity   Alcohol use: Not Currently   Drug use: Not Currently    Types: "Crack" cocaine    Comment: quit 20 years ago updated 10/26/22   Sexual activity: Not on file  Other Topics Concern   Not on file  Social History Narrative   Not on file   Social Determinants of Health   Financial Resource Strain: Medium Risk (03/25/2023)  Overall Financial Resource Strain (CARDIA)    Difficulty of Paying Living Expenses: Somewhat hard  Food Insecurity: Food Insecurity Present (03/25/2023)   Hunger Vital Sign    Worried About Running Out of Food in the Last Year: Often true    Ran Out of Food in the Last Year: Often true  Transportation Needs: No Transportation Needs (03/25/2023)   PRAPARE - Administrator, Civil Service (Medical): No    Lack of Transportation (Non-Medical): No  Physical Activity: Unknown (03/25/2023)   Exercise Vital Sign    Days of Exercise per Week: Patient declined    Minutes of Exercise per Session: Not on file  Stress: No Stress Concern Present (03/25/2023)   [employer] of Occupational Health - Occupational Stress  Questionnaire    Feeling of Stress : Only a little  Social Connections: Unknown (03/25/2023)   Social Connection and Isolation Panel [NHANES]    Frequency of Communication with Friends and Family: Three times a week    Frequency of Social Gatherings with Friends and Family: Three times a week    Attends Religious Services: Patient declined    Active Member of Clubs or Organizations: No    Attends Engineer, structural: Not on file    Marital Status: Separated     Family History: The patient's family history includes Heart disease in her father; Lung cancer in her father; Other in her mother. There is no history of Liver disease, Colon cancer, Esophageal cancer, Pancreatic cancer, Stomach cancer, Colon polyps, Crohn's disease, Rectal cancer, or Ulcerative colitis.  ROS:   Please see the history of present illness.     All other systems reviewed and are negative.  EKGs/Labs/Other Studies Reviewed:    The following studies were reviewed today:  EKG Interpretation Date/Time:  Friday June 04 2023 13:14:42 EDT Ventricular Rate:  79 PR Interval:  220 QRS Duration:  68 QT Interval:  352 QTC Calculation: 403 R Axis:   29  Text Interpretation: Sinus rhythm with 1st degree A-V block Septal infarct Confirmed by Debbe Odea ([id number]) on 06/04/2023 1:24:05 PM    Recent Labs: 02/25/2023: ALT 12; Hemoglobin 14.3; Platelets 109.0 04/23/2023: BUN 20; Creat 0.96; Magnesium 1.6; Potassium 4.1; Sodium 139  Recent Lipid Panel    Component Value Date/Time   CHOL 144 05/26/2022 1128   TRIG 98 05/26/2022 1128   HDL 40 05/26/2022 1128   LDLCALC 86 05/26/2022 1128     Risk Assessment/Calculations:     Physical Exam:    VS:  BP (!) 148/84 (BP Location: Left Arm, Patient Position: Sitting, Cuff Size: Large)   Pulse 79   Ht 5\' 1"  (1.549 m)   Wt 190 lb 9.6 oz (86.5 kg)   SpO2 95%   BMI 36.01 kg/m     Wt Readings from Last 3 Encounters:  06/04/23 190 lb 9.6 oz (86.5 kg)  04/27/23  191 lb 12.8 oz (87 kg)  03/29/23 194 lb 6.4 oz (88.2 kg)     GEN:  Well nourished, well developed in no acute distress HEENT: Normal NECK: No JVD; No carotid bruits CARDIAC: RRR, no murmurs, rubs, gallops RESPIRATORY:  diminished breath sounds with no wheezing or rhonchi  ABDOMEN: Soft, non-tender, non-distended MUSCULOSKELETAL:  No edema; No deformity  SKIN: Warm and dry NEUROLOGIC:  Alert and oriented x 3 PSYCHIATRIC:  Normal affect   ASSESSMENT:    1. Pre-procedural cardiovascular examination   2. Primary hypertension   3. Abnormal EKG   4. Smoking  PLAN:    In order of problems listed above:  Pre op eval. Knee surgery is being planned. Has several risk factors including smoking and possible septal infarct on EKG. Will get echo and lexiscan myoview. If no significant abnormalities, ok for procedure. HTN, bp elevated today, previously controlled. Cont norvasc 5, aldactone 50. Titrate norvasc if bp is elevated at f/u visit. 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 Workup as in #1. Current smoker. Cessation advised.      Informed Consent   Shared Decision Making/Informed Consent The risks [chest pain, shortness of breath, cardiac arrhythmias, dizziness, blood pressure fluctuations, myocardial infarction, stroke/transient ischemic attack, nausea, vomiting, allergic reaction, radiation exposure, metallic taste sensation and life-threatening complications (estimated to be 1 in 10,000)], benefits (risk stratification, diagnosing coronary artery disease, treatment guidance) and alternatives of a nuclear stress test were discussed in detail with Samantha Evans and she agrees to proceed.       Medication Adjustments/Labs and Tests Ordered: Current medicines are reviewed at length with the patient today.  Concerns regarding medicines are outlined above.  Orders Placed This Encounter  Procedures   NM Myocar Multi W/Spect W/Wall Motion / EF   EKG 12-Lead   EKG 12-Lead   ECHOCARDIOGRAM COMPLETE    No orders of the defined types were placed in this encounter.   Patient Instructions  Medication Instructions:   Your physician recommends that you continue on your current medications as directed. Please refer to the Current Medication list given to you today.  *If you need a refill on your cardiac medications before your next appointment, please call your pharmacy*   Lab Work:  None Ordered  If you have labs (blood work) drawn today and your tests are completely normal, you will receive your results only by: MyChart Message (if you have MyChart) OR A paper copy in the mail If you have any lab test that is abnormal or we need to change your treatment, we will call you to review the results.   Testing/Procedures:  Your physician has requested that you have an echocardiogram. Echocardiography is a painless test that uses sound waves to create images of your heart. It provides your doctor with information about the size and shape of your heart and how well your heart's chambers and valves are working. This procedure takes approximately one hour. There are no restrictions for this procedure. Please do NOT wear cologne, perfume, aftershave, or lotions (deodorant is allowed). Please arrive 15 minutes prior to your appointment time.   [facility] MYOVIEW  Your Provider has ordered a Stress Test with nuclear imaging. The purpose of this test is to evaluate the blood supply to your heart muscle. This procedure is referred to as a "Non-Invasive Stress Test." This is because other than having an IV started in your vein, nothing is inserted or "invades" your body. Cardiac stress tests are done to find areas of poor blood flow to the heart by determining the extent of coronary artery disease (CAD). Some patients exercise on a treadmill, which naturally increases the blood flow to your heart, while others who are unable to walk on a treadmill due to physical limitations have a pharmacologic/chemical  stress agent called Lexiscan. This medicine will mimic walking on a treadmill by temporarily increasing your coronary blood flow.     REPORT TO [facility] MEDICAL MALL ENTRANCE  **Proceed to the 1st desk on the right, REGISTRATION, to check in**  Please note: this test may take anywhere between 2-4 hours to complete  Instructions regarding medication:   __XXX_:   You may take all of your regular morning medications the day of your test unless listed below.   How to prepare for your Myoview test:  Do not eat or drink for 6 hours prior to the test No caffeine for 24 hours prior to the test No smoking 24 hours prior to the test. Ladies, please do not wear dresses.  Skirts or pants are appropriate. Please wear a short sleeve shirt. No perfume, cologne or lotion. Wear comfortable walking shoes. No heels!   PLEASE NOTIFY THE OFFICE AT LEAST 24 HOURS IN ADVANCE IF YOU ARE UNABLE TO KEEP YOUR APPOINTMENT.  [phone] AND  PLEASE NOTIFY NUCLEAR MEDICINE AT [facility] AT LEAST 24 HOURS IN ADVANCE IF YOU ARE UNABLE TO KEEP YOUR APPOINTMENT. [phone]     Follow-Up: At [facility], you and your health needs are our priority.  As part of our continuing mission to provide you with exceptional heart care, we have created designated Provider Care Teams.  These Care Teams include your primary Cardiologist (physician) and Advanced Practice Providers (APPs -  Physician Assistants and Nurse Practitioners) who all work together to provide you with the care you need, when you need it.  We recommend signing up for the patient portal called "MyChart".  Sign up information is provided on this After Visit Summary.  MyChart is used to connect with patients for Virtual Visits (Telemedicine).  Patients are able to view lab/test results, encounter notes, upcoming appointments, etc.  Non-urgent messages can be sent to your provider as well.   To learn more about what you can do with MyChart, go to  [email or web address].    Your next appointment:   2 month(s)  Provider:   You may see Debbe Odea, MD or one of the following Advanced Practice Providers on your designated Care Team:   Nicolasa Ducking, NP Eula Listen, PA-C Cadence Fransico Michael, PA-C Charlsie Quest, NP   Signed, Debbe Odea, MD  06/04/2023 2:28 PM    [facility]

## 2023-06-04 NOTE — Patient Instructions (Signed)
Medication Instructions:   Your physician recommends that you continue on your current medications as directed. Please refer to the Current Medication list given to you today.  *If you need a refill on your cardiac medications before your next appointment, please call your pharmacy*   Lab Work:  None Ordered  If you have labs (blood work) drawn today and your tests are completely normal, you will receive your results only by: MyChart Message (if you have MyChart) OR A paper copy in the mail If you have any lab test that is abnormal or we need to change your treatment, we will call you to review the results.   Testing/Procedures:  Your physician has requested that you have an echocardiogram. Echocardiography is a painless test that uses sound waves to create images of your heart. It provides your doctor with information about the size and shape of your heart and how well your heart's chambers and valves are working. This procedure takes approximately one hour. There are no restrictions for this procedure. Please do NOT wear cologne, perfume, aftershave, or lotions (deodorant is allowed). Please arrive 15 minutes prior to your appointment time.   [facility] MYOVIEW  Your Provider has ordered a Stress Test with nuclear imaging. The purpose of this test is to evaluate the blood supply to your heart muscle. This procedure is referred to as a "Non-Invasive Stress Test." This is because other than having an IV started in your vein, nothing is inserted or "invades" your body. Cardiac stress tests are done to find areas of poor blood flow to the heart by determining the extent of coronary artery disease (CAD). Some patients exercise on a treadmill, which naturally increases the blood flow to your heart, while others who are unable to walk on a treadmill due to physical limitations have a pharmacologic/chemical stress agent called Lexiscan. This medicine will mimic walking on a treadmill by temporarily  increasing your coronary blood flow.     REPORT TO [facility] MEDICAL MALL ENTRANCE  **Proceed to the 1st desk on the right, REGISTRATION, to check in**  Please note: this test may take anywhere between 2-4 hours to complete    Instructions regarding medication:   __XXX_:   You may take all of your regular morning medications the day of your test unless listed below.   How to prepare for your Myoview test:  Do not eat or drink for 6 hours prior to the test No caffeine for 24 hours prior to the test No smoking 24 hours prior to the test. Ladies, please do not wear dresses.  Skirts or pants are appropriate. Please wear a short sleeve shirt. No perfume, cologne or lotion. Wear comfortable walking shoes. No heels!   PLEASE NOTIFY THE OFFICE AT LEAST 24 HOURS IN ADVANCE IF YOU ARE UNABLE TO KEEP YOUR APPOINTMENT.  [phone] AND  PLEASE NOTIFY NUCLEAR MEDICINE AT [facility] AT LEAST 24 HOURS IN ADVANCE IF YOU ARE UNABLE TO KEEP YOUR APPOINTMENT. [phone]     Follow-Up: At [facility], you and your health needs are our priority.  As part of our continuing mission to provide you with exceptional heart care, we have created designated Provider Care Teams.  These Care Teams include your primary Cardiologist (physician) and Advanced Practice Providers (APPs -  Physician Assistants and Nurse Practitioners) who all work together to provide you with the care you need, when you need it.  We recommend signing up for the patient portal called "MyChart".  Sign up information is  provided on this After Visit Summary.  MyChart is used to connect with patients for Virtual Visits (Telemedicine).  Patients are able to view lab/test results, encounter notes, upcoming appointments, etc.  Non-urgent messages can be sent to your provider as well.   To learn more about what you can do with MyChart, go to [email or web address].    Your next appointment:   2 month(s)  Provider:   You may see Debbe Odea, MD or one of the following Advanced Practice Providers on your designated Care Team:   Nicolasa Ducking, NP Eula Listen, PA-C Cadence Fransico Michael, PA-C Charlsie Quest, NP

## 2023-06-17 ENCOUNTER — Ambulatory Visit
Admission: RE | Admit: 2023-06-17 | Discharge: 2023-06-17 | Disposition: A | Discharge status: Home / Self Care | Payer: Medicaid Other | Source: Ambulatory Visit | Attending: Cardiology | Admitting: Cardiology

## 2023-06-17 DIAGNOSIS — Z01810 Encounter for preprocedural cardiovascular examination: Secondary | ICD-10-CM | POA: Diagnosis not present

## 2023-06-17 DIAGNOSIS — R9431 Abnormal electrocardiogram [ECG] [EKG]: Secondary | ICD-10-CM | POA: Insufficient documentation

## 2023-06-17 DIAGNOSIS — Z7689 Persons encountering health services in other specified circumstances: Secondary | ICD-10-CM | POA: Diagnosis not present

## 2023-06-17 DIAGNOSIS — Z419 Encounter for procedure for purposes other than remedying health state, unspecified: Secondary | ICD-10-CM | POA: Diagnosis not present

## 2023-06-17 LAB — NM MYOCAR MULTI W/SPECT W/WALL MOTION / EF
Base ST Depression (mm): 0 mm
LV dias vol: 52 mL (ref 46–106)
LV sys vol: 14 mL
MPHR: 158 {beats}/min
Nuc Stress EF: 73 %
Peak HR: 86 {beats}/min
Percent HR: 54 %
Rest HR: 67 {beats}/min
Rest Nuclear Isotope Dose: 10.2 mCi
SDS: 1
SRS: 0
SSS: 0
ST Depression (mm): 0 mm
Stress Nuclear Isotope Dose: 30.9 mCi
TID: 0.97

## 2023-06-17 MED ORDER — TECHNETIUM TC 99M TETROFOSMIN IV KIT
10.0000 | PACK | Freq: Once | INTRAVENOUS | Status: AC
  Administered 2023-06-17: 10.19 via INTRAVENOUS

## 2023-06-17 MED ORDER — REGADENOSON 0.4 MG/5ML IV SOLN
0.4000 mg | Freq: Once | INTRAVENOUS | Status: AC
  Administered 2023-06-17: 0.4 mg via INTRAVENOUS

## 2023-06-17 MED ORDER — TECHNETIUM TC 99M TETROFOSMIN IV KIT
30.9000 | PACK | Freq: Once | INTRAVENOUS | Status: AC | PRN
  Administered 2023-06-17: 30.9 via INTRAVENOUS

## 2023-06-22 ENCOUNTER — Ambulatory Visit
Admission: RE | Admit: 2023-06-22 | Discharge: 2023-06-22 | Disposition: A | Discharge status: Home / Self Care | Payer: Medicaid Other | Source: Ambulatory Visit | Attending: Surgery | Admitting: Surgery

## 2023-06-22 DIAGNOSIS — D241 Benign neoplasm of right breast: Secondary | ICD-10-CM | POA: Diagnosis not present

## 2023-06-22 DIAGNOSIS — R92323 Mammographic fibroglandular density, bilateral breasts: Secondary | ICD-10-CM | POA: Diagnosis not present

## 2023-06-28 ENCOUNTER — Other Ambulatory Visit: Payer: Self-pay | Admitting: Cardiology

## 2023-06-28 DIAGNOSIS — IMO0001 Reserved for inherently not codable concepts without codable children: Secondary | ICD-10-CM

## 2023-06-28 DIAGNOSIS — I10 Essential (primary) hypertension: Secondary | ICD-10-CM

## 2023-06-28 DIAGNOSIS — R9431 Abnormal electrocardiogram [ECG] [EKG]: Secondary | ICD-10-CM

## 2023-06-28 DIAGNOSIS — Z01810 Encounter for preprocedural cardiovascular examination: Secondary | ICD-10-CM

## 2023-06-28 DIAGNOSIS — F17200 Nicotine dependence, unspecified, uncomplicated: Secondary | ICD-10-CM

## 2023-06-29 ENCOUNTER — Ambulatory Visit: Payer: Medicaid Other | Admitting: Surgery

## 2023-06-29 DIAGNOSIS — R32 Unspecified urinary incontinence: Secondary | ICD-10-CM | POA: Diagnosis not present

## 2023-06-30 ENCOUNTER — Encounter: Payer: Self-pay | Admitting: Family Medicine

## 2023-06-30 ENCOUNTER — Ambulatory Visit: Payer: Medicaid Other | Attending: Family Medicine | Admitting: Family Medicine

## 2023-06-30 DIAGNOSIS — Z7689 Persons encountering health services in other specified circumstances: Secondary | ICD-10-CM | POA: Diagnosis not present

## 2023-06-30 DIAGNOSIS — I10 Essential (primary) hypertension: Secondary | ICD-10-CM | POA: Diagnosis not present

## 2023-06-30 DIAGNOSIS — M170 Bilateral primary osteoarthritis of knee: Secondary | ICD-10-CM

## 2023-06-30 MED ORDER — SPIRONOLACTONE 50 MG PO TABS
50.0000 mg | ORAL_TABLET | Freq: Every day | ORAL | 1 refills | Status: DC
Start: 2023-06-30 — End: 2023-07-06

## 2023-06-30 MED ORDER — AMLODIPINE BESYLATE 5 MG PO TABS
5.0000 mg | ORAL_TABLET | Freq: Every day | ORAL | 1 refills | Status: AC
Start: 2023-06-30 — End: ?

## 2023-06-30 NOTE — Patient Instructions (Signed)

## 2023-06-30 NOTE — Progress Notes (Signed)
Subjective:  Patient ID: Samantha Evans, female    DOB: 01/20/1961  Age: 62 y.o. MRN: [id number]  CC: Medical Management of Chronic Issues   HPI Samantha Evans is a 62 y.o. year old female with a history of  hepatitis C, liver cirrhosis, incarcerated umbilical hernia repair, hypertension, Nicotine dependence (half a ppd x40 years down to 3 Cigarettes/ day), osteoarthritis of the knees, hypercalcemia.  Interval History: Discussed the use of AI scribe software for clinical note transcription with the patient, who gave verbal consent to proceed.  She presents in anticipation of right knee surgery scheduled for October 21st. She reports persistent right knee pain that has been 'dragging and dragging.' The pain is so severe that it affects her gait and causes back pain. She is not currently using a knee brace for support.  The patient also mentions her hernia that has been increasing in size, but plans to address it after the knee surgery.  She is currently taking amlodipine for hypertension and spironolactone. Her blood pressure was slightly elevated at the visit, but she attributes this to talking during the measurement.  The patient admits to smoking about three cigarettes a day, despite having a prescription to help quit. She has not been using the prescription.  She also has a history of high calcium levels and has been referred to an endocrinologist. She was advised to take magnesium and drink eight 8-ounce glasses of fluid per day.        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 updated 10/26/22    Past Surgical History:  Procedure Laterality Date   BREAST BIOPSY WITH RADIO FREQUENCY LOCALIZER Right 08/12/2022    Procedure: BREAST BIOPSY- Office manager;  Surgeon: Campbell Lerner, MD;  Location: [facility] ORS;  Service: General;  Laterality: Right;   CATARACT EXTRACTION Bilateral    CHOLECYSTECTOMY     COLONOSCOPY     ERCP     POLYPECTOMY     UMBILICAL HERNIA REPAIR N/A 02/27/2021   Procedure: REPAIR INCARCERATED UMBILICAL HERNIA;  Surgeon: Violeta Gelinas, MD;  Location: [facility] OR;  Service: General;  Laterality: N/A;    Family History  Problem Relation Age of Onset   Other Mother        Hit by a train while in her truck   Heart disease Father    Lung cancer Father    Liver disease Neg Hx    Colon cancer Neg Hx    Esophageal cancer Neg Hx    Pancreatic cancer Neg Hx    Stomach cancer Neg Hx    Colon polyps Neg Hx    Crohn's disease Neg Hx    Rectal cancer Neg Hx    Ulcerative colitis Neg Hx     Social History   Socioeconomic History   Marital status: Legally Separated    Spouse name: Not on file   Number of children: 4   Years of education: Not on file   Highest education level: 12th grade  Occupational History   Occupation: unemployed  Tobacco Use   Smoking status: Every Day    Current packs/day: 0.50    Average packs/day: 0.5 packs/day for 40.0 years (  20.0 ttl pk-yrs)    Types: Cigarettes    Passive exposure: Past   Smokeless tobacco: Never   Tobacco comments:    Quit smoking on 02/27/2021 when hernia repair surgery occurred.  Back to smoking .5 pack of cigarettes daily  Vaping Use   Vaping status: Never Used  Substance and Sexual Activity   Alcohol use: Not Currently   Drug use: Not Currently    Types: "Crack" cocaine    Comment: quit 20 years ago updated 10/26/22   Sexual activity: Not on file  Other Topics Concern   Not on file  Social History Narrative   Not on file   Social Determinants of Health   Financial Resource Strain: Medium Risk (03/25/2023)   Overall Financial Resource Strain (CARDIA)    Difficulty of Paying Living Expenses: Somewhat hard  Food  Insecurity: Food Insecurity Present (03/25/2023)   Hunger Vital Sign    Worried About Running Out of Food in the Last Year: Often true    Ran Out of Food in the Last Year: Often true  Transportation Needs: No Transportation Needs (03/25/2023)   PRAPARE - Administrator, Civil Service (Medical): No    Lack of Transportation (Non-Medical): No  Physical Activity: Unknown (03/25/2023)   Exercise Vital Sign    Days of Exercise per Week: Patient declined    Minutes of Exercise per Session: Not on file  Stress: No Stress Concern Present (03/25/2023)   [employer] of Occupational Health - Occupational Stress Questionnaire    Feeling of Stress : Only a little  Social Connections: Unknown (03/25/2023)   Social Connection and Isolation Panel [NHANES]    Frequency of Communication with Friends and Family: Three times a week    Frequency of Social Gatherings with Friends and Family: Three times a week    Attends Religious Services: Patient declined    Active Member of Clubs or Organizations: No    Attends Engineer, structural: Not on file    Marital Status: Separated    No Known Allergies  Outpatient Medications Prior to Visit  Medication Sig Dispense Refill   amLODipine (NORVASC) 5 MG tablet Take 1 tablet (5 mg total) by mouth daily. 90 tablet 0   spironolactone (ALDACTONE) 50 MG tablet Take 1 tablet (50 mg total) by mouth daily. 90 tablet 0   Misc. Devices MISC BP monitor (Patient not taking: Reported on 03/29/2023) 1 each 0   Misc. Devices MISC Depends XL. Diagnosis - urinary incontinence (Patient not taking: Reported on 06/30/2023) 1 Box 12   cetirizine (ZYRTEC) 10 MG tablet TAKE 1 TABLET BY MOUTH EVERY DAY (Patient not taking: Reported on 06/04/2023) 90 tablet 1   HYDROcodone-acetaminophen (NORCO/VICODIN) 5-325 MG tablet Take 1 tablet by mouth every 6 (six) hours as needed for moderate pain. (Patient not taking: Reported on 03/29/2023) 15 tablet 0   ibuprofen (ADVIL) 200 MG  tablet Take 400 mg by mouth daily as needed for headache. (Patient not taking: Reported on 03/29/2023)     magnesium hydroxide (MILK OF MAGNESIA) 400 MG/5ML suspension Take by mouth daily as needed for mild constipation. (Patient not taking: Reported on 03/29/2023)     olopatadine (PATANOL) 0.1 % ophthalmic solution Place 1 drop into both eyes 2 (two) times daily. (Patient not taking: Reported on 06/30/2023) 5 mL 6   Psyllium (METAMUCIL PO) Take 16 oz by mouth See admin instructions. Take 16 oz by mouth every 2 days. (Patient not taking: Reported on 03/29/2023)  No facility-administered medications prior to visit.     ROS Review of Systems  Constitutional:  Negative for activity change and appetite change.  HENT:  Negative for sinus pressure and sore throat.   Respiratory:  Negative for chest tightness, shortness of breath and wheezing.   Cardiovascular:  Negative for chest pain and palpitations.  Gastrointestinal:  Negative for abdominal distention, abdominal pain and constipation.  Genitourinary: Negative.   Musculoskeletal:        See HPI  Psychiatric/Behavioral:  Negative for behavioral problems and dysphoric mood.     Objective:  BP 137/80   Pulse 72   Ht 5\' 1"  (1.549 m)   Wt 193 lb 6.4 oz (87.7 kg)   SpO2 97%   BMI 36.54 kg/m      06/30/2023    3:43 PM 06/30/2023    3:04 PM 06/04/2023    1:09 PM  BP/Weight  Systolic BP 137 145 148  Diastolic BP 80 83 84  Wt. (Lbs)  193.4 190.6  BMI  36.54 kg/m2 36.01 kg/m2      Physical Exam Constitutional:      Appearance: She is well-developed.  Cardiovascular:     Rate and Rhythm: Normal rate.     Heart sounds: Normal heart sounds. No murmur heard. Pulmonary:     Effort: Pulmonary effort is normal.     Breath sounds: Normal breath sounds. No wheezing or rales.  Chest:     Chest wall: No tenderness.  Abdominal:     General: Bowel sounds are normal. There is no distension.     Palpations: Abdomen is soft. There is no mass.      Tenderness: There is no abdominal tenderness.     Hernia: A hernia is present.  Musculoskeletal:     Right knee: Decreased range of motion. Tenderness present.     Left knee: Normal range of motion. No tenderness.     Right lower leg: No edema.     Left lower leg: No edema.  Neurological:     Mental Status: She is alert and oriented to person, place, and time.  Psychiatric:        Mood and Affect: Mood normal.        Latest Ref Rng & Units 04/23/2023    2:58 PM 02/25/2023    2:58 PM 09/28/2022    2:44 PM  CMP  Glucose 65 - 99 mg/dL 409  811  81   BUN 7 - 25 mg/dL 20  16  17    Creatinine 0.50 - 1.05 mg/dL 9.14  7.82  9.56   Sodium 135 - 146 mmol/L 139  139  142   Potassium 3.5 - 5.3 mmol/L 4.1  3.9  4.1   Chloride 98 - 110 mmol/L 109  107  105   CO2 20 - 32 mmol/L 24  26  23    Calcium 8.6 - 10.4 mg/dL 8.6 - 21.3 mg/dL 08.6    57.8  46.9  62.9   Total Protein 6.0 - 8.3 g/dL  7.2  7.3   Total Bilirubin 0.2 - 1.2 mg/dL  0.8  0.8   Alkaline Phos 39 - 117 U/L  89  121   AST 0 - 37 U/L  21  30   ALT 0 - 35 U/L  12  11     Lipid Panel     Component Value Date/Time   CHOL 144 05/26/2022 1128   TRIG 98 05/26/2022 1128   HDL 40 05/26/2022 1128  LDLCALC 86 05/26/2022 1128    CBC    Component Value Date/Time   WBC 4.5 02/25/2023 1458   RBC 4.45 02/25/2023 1458   HGB 14.3 02/25/2023 1458   HGB 12.5 05/26/2022 1128   HCT 40.0 02/25/2023 1458   HCT 35.9 05/26/2022 1128   PLT 109.0 (L) 02/25/2023 1458   PLT 130 (L) 05/26/2022 1128   MCV 89.8 02/25/2023 1458   MCV 85 05/26/2022 1128   MCH 29.5 06/07/2022 2125   MCHC 35.7 02/25/2023 1458   RDW 14.2 02/25/2023 1458   RDW 14.5 05/26/2022 1128   LYMPHSABS 1.0 02/25/2023 1458   LYMPHSABS 1.1 05/26/2022 1128   MONOABS 0.3 02/25/2023 1458   EOSABS 0.2 02/25/2023 1458   EOSABS 0.1 05/26/2022 1128   BASOSABS 0.0 02/25/2023 1458   BASOSABS 0.0 05/26/2022 1128    Lab Results  Component Value Date   HGBA1C 4.8  05/26/2022    Assessment & Plan:      Right Knee Osteoarthritis Severe pain affecting gait and causing secondary back pain. Surgery scheduled for 09/06/2023. -Continue current pain management plan. -Consider over-the-counter knee brace for additional support.  Hypertension Elevated blood pressure reading (145/83) during visit, possibly due to patient talking during measurement. -Repeat blood pressure measurement - 137/80 -Continue Amlodipine and Spironolactone as prescribed. -90-day supply of medications to be sent to [facility].  Tobacco Use Patient reports smoking approximately three cigarettes per day. Discussed impact on surgical healing. -Encouraged cessation prior to upcoming surgery. -Previously prescribed medication to assist with cessation, but patient has not been using it.  Hypercalcemia Patient referred to endocrinologist due to high calcium levels. Patient reports being advised to take magnesium. -Continue following endocrinologist's recommendations.  Ventral hernia Patient reports increasing size of hernia, but no pain. Plans to address after knee surgery. -No immediate intervention planned.  General Health Maintenance -Continue monitoring blood pressure. -Encourage smoking cessation. -Follow up in six months or sooner if needed.          Meds ordered this encounter  Medications   amLODipine (NORVASC) 5 MG tablet    Sig: Take 1 tablet (5 mg total) by mouth daily.    Dispense:  90 tablet    Refill:  1   spironolactone (ALDACTONE) 50 MG tablet    Sig: Take 1 tablet (50 mg total) by mouth daily.    Dispense:  90 tablet    Refill:  1    Follow-up: Return in about 6 months (around 12/31/2023) for Chronic medical conditions.       Hoy Register, MD, FAAFP. [facility] [place], [place] [phone]   06/30/2023, 6:06 PM

## 2023-07-05 ENCOUNTER — Ambulatory Visit: Payer: Medicaid Other

## 2023-07-05 DIAGNOSIS — Z01810 Encounter for preprocedural cardiovascular examination: Secondary | ICD-10-CM

## 2023-07-05 DIAGNOSIS — Z7689 Persons encountering health services in other specified circumstances: Secondary | ICD-10-CM | POA: Diagnosis not present

## 2023-07-05 LAB — ECHOCARDIOGRAM COMPLETE
Area-P 1/2: 2.99 cm2
S' Lateral: 2.8 cm

## 2023-07-06 ENCOUNTER — Encounter: Payer: Self-pay | Admitting: Surgery

## 2023-07-06 ENCOUNTER — Ambulatory Visit (INDEPENDENT_AMBULATORY_CARE_PROVIDER_SITE_OTHER): Payer: Medicaid Other | Admitting: Surgery

## 2023-07-06 VITALS — BP 121/75 | HR 84 | Temp 97.8°F | Wt 188.8 lb

## 2023-07-06 DIAGNOSIS — K7460 Unspecified cirrhosis of liver: Secondary | ICD-10-CM

## 2023-07-06 DIAGNOSIS — K769 Liver disease, unspecified: Secondary | ICD-10-CM

## 2023-07-06 DIAGNOSIS — K862 Cyst of pancreas: Secondary | ICD-10-CM | POA: Diagnosis not present

## 2023-07-06 DIAGNOSIS — F17210 Nicotine dependence, cigarettes, uncomplicated: Secondary | ICD-10-CM | POA: Diagnosis not present

## 2023-07-06 DIAGNOSIS — R933 Abnormal findings on diagnostic imaging of other parts of digestive tract: Secondary | ICD-10-CM

## 2023-07-06 DIAGNOSIS — Z7689 Persons encountering health services in other specified circumstances: Secondary | ICD-10-CM | POA: Diagnosis not present

## 2023-07-06 DIAGNOSIS — D241 Benign neoplasm of right breast: Secondary | ICD-10-CM | POA: Diagnosis not present

## 2023-07-06 DIAGNOSIS — K432 Incisional hernia without obstruction or gangrene: Secondary | ICD-10-CM

## 2023-07-06 MED ORDER — ABDOMINAL BINDER/ELASTIC XL MISC: 1.0000 | 0 refills | Status: AC | PRN

## 2023-07-06 NOTE — Progress Notes (Signed)
Patient ID: Samantha Evans, female   DOB: 26-May-1961, 62 y.o.   MRN: [id number]  Chief Complaint: Presents today with known periumbilical hernia.  Follow-up breast imaging after excisional biopsy right breast, September/2023.   History of Present Illness Expected right breast f/u after lumpectomy with f/u diagnostic imaging of the right breast.  She reports no new breast issues.  Previous tethering has resolved.   Recurrent anterior abdominal wall hernia about the same, no bowel habit changes reported.  No remarkable pain or tenderness, still wants to pursue with repair.  She continues to smoke about 4 to 5 cigarettes/day.  No reported significant weight loss. She has planned surgery for her right knee, October, 2024. We are following MRI of the abdomen regarding liver and pancreatic lesions, anticipating repeat MRI in November, 2024.  Notes from previous. Samantha Evans is a 62 y.o. female with the above.  Last CT scan was July 2023 confirming the umbilical periumbilical hernia defect.  Prior hernia repair in April 2022.  Reportedly no mesh was utilized at that time as she presented with bowel obstruction.  Currently complains of discomfort during the day but otherwise only presents with a bulge.  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 updated 10/26/22      Past Surgical History:  Procedure Laterality Date   BREAST BIOPSY WITH RADIO FREQUENCY LOCALIZER Right 08/12/2022   Procedure: BREAST BIOPSY- Office manager;  Surgeon: Campbell Lerner, MD;  Location: [facility] ORS;  Service: General;  Laterality: Right;   CATARACT EXTRACTION Bilateral    CHOLECYSTECTOMY     COLONOSCOPY     ERCP     POLYPECTOMY      UMBILICAL HERNIA REPAIR N/A 02/27/2021   Procedure: REPAIR INCARCERATED UMBILICAL HERNIA;  Surgeon: Violeta Gelinas, MD;  Location: [facility] OR;  Service: General;  Laterality: N/A;    No Known Allergies  Current Outpatient Medications  Medication Sig Dispense Refill   amLODipine (NORVASC) 5 MG tablet Take 1 tablet (5 mg total) by mouth daily. 90 tablet 1   Elastic Bandages & Supports (ABDOMINAL BINDER/ELASTIC XL) MISC 1 Container by Does not apply route as needed. 1 each 0   magnesium hydroxide (MILK OF MAGNESIA) 400 MG/5ML suspension Take by mouth daily as needed for mild constipation.     No current facility-administered medications for this visit.    Family History Family History  Problem Relation Age of Onset   Other Mother        Hit by a train while in her truck   Heart disease Father    Lung cancer Father    Liver disease Neg Hx    Colon cancer Neg Hx    Esophageal cancer Neg Hx    Pancreatic cancer Neg Hx    Stomach cancer Neg Hx    Colon polyps Neg Hx    Crohn's disease Neg Hx    Rectal cancer Neg Hx    Ulcerative colitis Neg Hx       Social History Social History   Tobacco Use   Smoking status: Every Day    Current packs/day: 0.50    Average packs/day: 0.5 packs/day for 40.0 years (20.0  ttl pk-yrs)    Types: Cigarettes    Passive exposure: Past   Smokeless tobacco: Never   Tobacco comments:    Quit smoking on 02/27/2021 when hernia repair surgery occurred.  Back to smoking .5 pack of cigarettes daily  Vaping Use   Vaping status: Never Used  Substance Use Topics   Alcohol use: Not Currently   Drug use: Not Currently    Types: "Crack" cocaine    Comment: quit 20 years ago updated 10/26/22        Review of Systems  Constitutional: Negative.   HENT: Negative.    Eyes: Negative.   Respiratory: Negative.    Cardiovascular:  Positive for leg swelling.  Gastrointestinal:  Positive for abdominal pain.  Genitourinary:  Positive for frequency.  Skin:  Negative.   Neurological:  Positive for headaches.  Psychiatric/Behavioral: Negative.    All other systems reviewed and are negative.     Physical Exam Blood pressure 121/75, pulse 84, temperature 97.8 F (36.6 C), temperature source Oral, weight 188 lb 12.8 oz (85.6 kg), SpO2 95%. Last Weight  Most recent update: 07/06/2023 10:01 AM    Weight  85.6 kg (188 lb 12.8 oz)              CONSTITUTIONAL: Well developed, and nourished, appropriately responsive and aware without distress.   EYES: Sclera non-icteric.   EARS, NOSE, MOUTH AND THROAT:  The oropharynx is clear. Oral mucosa is pink and moist.   Hearing is intact to voice.  NECK: Trachea is midline, and there is no jugular venous distension.  LYMPH NODES:  Lymph nodes in the neck are not enlarged. RESPIRATORY:  Lungs are clear, and breath sounds are equal bilaterally. Normal respiratory effort without pathologic use of accessory muscles. CARDIOVASCULAR: Heart is regular in rate and rhythm. GI: The abdomen is soft, nontender, and nondistended.  There is a reducible midline periumbilical hernia, I believe I can appreciate an approximate 3 cm fascial defect present.  There were no palpable masses. I did not appreciate hepatosplenomegaly. There were normal bowel sounds.  No succussion splash of ascites.  No obvious stigmata of cirrhosis/portal hypertension.  Breast exam: [name] present as chaperone: Right breast examination, lateral incision is clean, dry and intact.  No induration, erythema or fluctuance to suggest hematoma or infection.  No suspicious or dominant masses present, slight tethering and dimpling with raising her right arm.  No suspicious or dominant masses present in the left breast.  MUSCULOSKELETAL:  Symmetrical muscle tone appreciated in all four extremities.    SKIN: Skin turgor is normal. No pathologic skin lesions appreciated.  NEUROLOGIC:  Motor and sensation appear grossly normal.  Cranial nerves are grossly  without defect. PSYCH:  Alert and oriented to person, place and time. Affect is appropriate for situation.  Data Reviewed I have personally reviewed what is currently available of the patient's imaging, recent labs and medical records.   Labs:     Latest Ref Rng & Units 02/25/2023    2:58 PM 06/07/2022    9:25 PM 05/26/2022   11:28 AM  CBC  WBC 4.0 - 10.5 K/uL 4.5  5.5  4.3   Hemoglobin 12.0 - 15.0 g/dL 16.1  09.6  04.5   Hematocrit 36.0 - 46.0 % 40.0  36.5  35.9   Platelets 150.0 - 400.0 K/uL 109.0  119  130       Latest Ref Rng & Units 04/23/2023    2:58 PM 02/25/2023    2:58 PM 09/28/2022  2:44 PM  CMP  Glucose 65 - 99 mg/dL 956  213  81   BUN 7 - 25 mg/dL 20  16  17    Creatinine 0.50 - 1.05 mg/dL 0.86  5.78  4.69   Sodium 135 - 146 mmol/L 139  139  142   Potassium 3.5 - 5.3 mmol/L 4.1  3.9  4.1   Chloride 98 - 110 mmol/L 109  107  105   CO2 20 - 32 mmol/L 24  26  23    Calcium 8.6 - 10.4 mg/dL 8.6 - 62.9 mg/dL 52.8    41.3  24.4  01.0   Total Protein 6.0 - 8.3 g/dL  7.2  7.3   Total Bilirubin 0.2 - 1.2 mg/dL  0.8  0.8   Alkaline Phos 39 - 117 U/L  89  121   AST 0 - 37 U/L  21  30   ALT 0 - 35 U/L  12  11    SURGICAL PATHOLOGY  Excisional Biopsy CASE: [id number]  PATIENT: [facility]  Surgical Pathology Report   Specimen Submitted:  A. Breast, right lateral   Clinical History: Papilloma right breast   DIAGNOSIS:  A.  BREAST, RIGHT LATERAL; IMAGE LOCALIZED EXCISION:  - MULTIPLE INTRADUCTAL PAPILLOMATA/PAPILLOMATOSIS.  - RADIAL SCARS, FOCI OF SCLEROSING ADENOSIS AND MICROCALCIFICATIONS.  - SMALL HYALINIZED FIBROADENOMA WITH CALCIFICATIONS.  - BIOPSY CLIP AND PREVIOUS BIOPSY SITE RELATED CHANGES.  - FEW MARGINS ARE CLOSE BUT NEGATIVE FOR PAPILLOMA.   Comment  No atypical hyperplasia or malignancy are seen.  The spectrum of changes  noted above and, in multiple sections of this lumpectomy specimen may  represent a complex sclerosing lesion.   GROSS  DESCRIPTION:  A. Labeled: Right lateral breast tissue  Received: Without fixative  Specimen radiograph image(s) available for review  Radiographic findings: A clip and RFID tag are seen on imaging.  Time in fixative: Specimen is collected at 8:40 AM and placed in  formalin at 8:56 AM on 08/12/2022.                                   Cold  ischemic time: Less than 30 minutes  Total fixation time: 10 hours  Type of procedure: Breast biopsy with radial frequency localizer  Location / laterality of specimen: Right  Orientation of specimen: Specimen is oriented with a suture at anterior  margin.  Remaining presumed orientation discussed with pathologist.  Inking:  Note: Margins presumed due to single suture orientation of specimen.  Anterior = green  Inferior = blue  Lateral = orange  Medial = yellow  Posterior = black  Superior = red  Size of specimen: 15.7 g, 5.6 cm (anterior to posterior) x 4.7 cm  (medial to lateral) x 1.2 cm (superior to inferior)  Skin: None present  Biopsy site: A silver-colored clip is identified within tissue.  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 Of note, the surgical specimen demonstrated papillomatosis/radial scars and no evidence of atypia or malignancy.    CLINICAL DATA:  Patient with history of a papilloma with associated ADH bordering on low-grade DCIS biopsied in August  2023. This underwent surgical excision in September 2023 and demonstrated a complex sclerosing lesion with papillomatosis. No residual atypia was identified. Patient is undergoing lumpectomy protocol.   EXAM: DIGITAL DIAGNOSTIC BILATERAL MAMMOGRAM WITH  TOMOSYNTHESIS AND CAD   TECHNIQUE: Bilateral digital diagnostic mammography and breast tomosynthesis was performed. The images were evaluated with computer-aided detection.   COMPARISON:  Previous exam(s).   ACR Breast Density Category b: There are scattered areas of fibroglandular density.   FINDINGS: There is density and architectural distortion within the RIGHT breast, consistent with postsurgical changes. These are stable in comparison to prior. No suspicious mass, distortion, or microcalcifications are identified to suggest presence of malignancy.   IMPRESSION: No mammographic evidence of malignancy bilaterally.   RECOMMENDATION: Recommend bilateral diagnostic mammogram (with RIGHT and LEFT breast ultrasound if deemed necessary) in 1 year per lumpectomy protocol.   I have discussed the findings and recommendations with the patient. If applicable, a reminder letter will be sent to the patient regarding the next appointment.   BI-RADS CATEGORY  2: Benign.     Electronically Signed   By: Meda Klinefelter M.D.   On: 06/22/2023 11:57    EXAM: CT ABDOMEN AND PELVIS WITH CONTRAST   TECHNIQUE: Multidetector CT imaging of the abdomen and pelvis was performed using the standard protocol following bolus administration of intravenous contrast.   RADIATION DOSE REDUCTION: This exam was performed according to the departmental dose-optimization program which includes automated exposure control, adjustment of the mA and/or kV according to patient size and/or use of iterative reconstruction technique.   CONTRAST:  OMNIPAQUE IOHEXOL 300 MG/ML  SOLN   COMPARISON:  None Available.   FINDINGS: Lower Chest: Normal.   Hepatobiliary: Diffusely nodular hepatic contours, consistent with hepatic cirrhosis. No focal liver lesion. There is unchanged intrahepatic biliary dilatation. Unchanged cyst in the left hepatic lobe. Status post cholecystectomy.   Pancreas: Normal  pancreas. No ductal dilatation or peripancreatic fluid collection.   Spleen: Normal.   Adrenals/Urinary Tract: The adrenal glands are normal. No hydronephrosis, nephroureterolithiasis or solid renal mass. The urinary bladder is normal for degree of distention   Stomach/Bowel: There is no hiatal hernia. Normal duodenal course and caliber. There is a ventral abdominal hernia that contains a loop of small bowel with mild adjacent fat stranding and a small amount of fluid in the hernia sac. No proximal dilatation. Slightly superiorly, there is a second fat containing ventral hernia. Rectosigmoid diverticulosis without acute inflammation. Normal appendix.   Vascular/Lymphatic: There is calcific atherosclerosis of the abdominal aorta. No lymphadenopathy.   Reproductive: Normal uterus. No adnexal mass.   Other: None.   Musculoskeletal: No bony spinal canal stenosis or focal osseous abnormality.   IMPRESSION: 1. Ventral abdominal hernia that contains a loop of small bowel with mild adjacent fat stranding and a small amount of fluid in the hernia sac. This may indicate early strangulation. 2. Hepatic cirrhosis. 3. Rectosigmoid diverticulosis without acute inflammation. 4. Aortic Atherosclerosis (ICD10-I70.0).     Electronically Signed   By: Deatra Robinson M.D.   On: 06/08/2022 01:38  CLINICAL DATA:  Follow-up pancreatic cysts, cirrhosis   EXAM: MRI ABDOMEN WITHOUT AND WITH CONTRAST   TECHNIQUE: Multiplanar multisequence MR imaging of the abdomen was performed both before and after the administration of intravenous contrast.   CONTRAST:  9mL GADAVIST GADOBUTROL 1 MMOL/ML IV SOLN   COMPARISON:  09/24/2022, 04/08/2022   FINDINGS: Lower chest: No acute abnormality.   Hepatobiliary: Coarse, nodular cirrhotic morphology liver. Multiple simple, benign liver  cysts, for which no further follow-up or characterization is required. Occasional tiny foci of  arterial hyperenhancement are unchanged, for example in the anterior left lobe of the liver, hepatic segment II measuring 0.3 cm (series 19, image 21). No evidence of washout or capsular enhancement. No new lesions. Status post cholecystectomy. Postoperative intra and extrahepatic biliary ductal dilatation, unchanged.   Pancreas: Multiple unchanged pancreatic cystic lesions, largest again in the tip of the pancreatic tail measuring 1.8 x 1.1 cm (series 12, image 13). No pancreatic ductal dilatation or surrounding inflammatory changes.   Spleen: Unchanged splenomegaly, maximum coronal span 15.6 cm.   Adrenals/Urinary Tract: Adrenal glands are unremarkable. Multiple simple, benign bilateral renal cortical cysts, for which no further follow-up or characterization is required. Kidneys are otherwise normal, without obvious renal calculi, solid lesion, or hydronephrosis.   Stomach/Bowel: Stomach is within normal limits. No evidence of bowel wall thickening, distention, or inflammatory changes.   Vascular/Lymphatic: Aortic atherosclerosis no enlarged abdominal lymph nodes.   Other: Partially imaged midline ventral hernias containing bowel and fluid (series 12, image 27). No ascites.   Musculoskeletal: No acute or significant osseous findings.   IMPRESSION: 1. Occasional tiny foci of arterial hyperenhancement in the liver are unchanged. No evidence of washout or capsular enhancement. No new lesions. LI-RADS category 3, indeterminate for hepatocellular carcinoma. Continued attention on follow-up at 6 months in the setting of cirrhosis. 2. Multiple unchanged pancreatic cystic lesions, largest again in the tip of the pancreatic tail measuring 1.8 x 1.1 cm. No pancreatic ductal dilatation or surrounding inflammatory changes. 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 Although no specific further follow-up or characterization is required for these lesions, attention on  follow-up at the time of above recommended LI-RADS surveillance. 3. Cirrhosis and splenomegaly. 4. Partially imaged midline ventral hernias containing bowel and fluid.   Aortic Atherosclerosis (ICD10-I70.0).     Electronically Signed   By: Jearld Lesch M.D.   On: 04/01/2023 09:34   Assessment     Patient Active Problem List   Diagnosis Date Noted   Urge incontinence of urine 05/18/2023   Pancreatic cyst 11/26/2022   Abnormal magnetic resonance cholangiopancreatography (MRCP) 08/27/2022   Papilloma of right breast 07/16/2022   Ventral hernia, recurrent 07/16/2022   Primary osteoarthritis of right knee 06/04/2022   Peripheral edema 05/12/2021   Chronic hepatitis C without hepatic coma (HCC) 03/25/2021   Acid reflux 03/25/2021   Hepatic cirrhosis (HCC)- by CT scan 02/27/2021   AKI (acute kidney injury) (HCC) 02/27/2021    Plan    Repeat MRI ABDOMEN WITHOUT AND WITH CONTRAST  November 2024  Bilateral diagnostic mammography August 2025.   Patient needs follow-up imaging regarding hepatic lesions and pancreatic lesions with MRI.   We discussed proceeding with her hernia repair, however I feel it is prudent to defer elective surgery for continued follow-up/observation, get her past her knee surgery, which may assist her with some degree of weight loss to optimize her elective hernia surgery.  We did discuss once again the need for smoking cessation. We have also made available an abdominal binder to assist her with supporting her hernia.  And repetition I have also encouraged her to watch her diet, improve her health with some weight loss, and stressed smoking cessation for the sake of her postoperative course.  I believe she understands this and still wants to get her hernia repaired. Risks reviewed and accepted.  All questions answered.  Face-to-face time spent with the patient and accompanying care  providers(if present) was 55 minutes, with more than 50% of the time spent  counseling, educating, and coordinating care of the patient.    These notes generated with voice recognition software. I apologize for typographical errors.  Campbell Lerner M.D., FACS 07/06/2023, 12:59 PM

## 2023-07-06 NOTE — Patient Instructions (Addendum)
If you have any concerns or questions, please feel free to call our office.  Breast Self-Awareness Breast self-awareness means being familiar with how your breasts look and feel. It involves checking your breasts regularly and telling your health care provider about any changes. Practicing breast self-awareness helps to maintain breast health. Sometimes, changes are not harmful (are benign). Other times, a change in your breasts can be a sign of a serious medical problem. Being familiar with the look and feel of your breasts can help you catch a breast problem while it is still small and can be treated. You should do breast self-exams even if you have breast implants. What you need: A mirror. A well-lit room. A pillow or other soft object. How to do a breast self-exam A breast self-exam is one way to learn what is normal for your breasts and whether your breasts are changing. To do a breast self-exam: Look for changes  Remove all the clothing above your waist. Stand in front of a mirror in a room with good lighting. Put your hands down at your sides. Compare your breasts in the mirror. Look for differences between them (asymmetry), such as: Differences in shape. Differences in size. Puckers, dips, and bumps in one breast and not the other. Look at each breast for changes in the skin, such as: Redness. Scaly areas. Skin thickening. Dimpling. Open sores (ulcers). Look for changes in your nipples, such as: Discharge. Bleeding. Dimpling. Redness. A nipple that looks pushed in (retracted), or that has changed position. Feel for changes Carefully feel your breasts for lumps and changes. It is best to do this self-exam while lying down. Follow these steps to feel each breast: Place a pillow under the shoulder of one side of your body. Place the arm of that side of your body behind your head. Feel the breast of that side of your body using the hand of the opposite arm. To do this: Start  in the nipple area and use the pads of your three middle fingers to make -inch (2 cm) overlapping circles. Use light, medium, and then firm pressure as you feel your breast, gently covering the entire breast area and armpit. Continue the overlapping circles, moving downward over the breast until you feel your ribs below your breast. Then, make circles with your fingers going upward until you reach your collarbone. Next, make circles by moving outward across your breast and into your armpit area. Squeeze the nipple. Check for discharge and lumps. Repeat steps 1-7 to check your other breast. Sit or stand in the tub or shower. With soapy water on your skin, feel each breast the same way you did when you were lying down. Write down what you find Writing down what you find can help you remember what to discuss with your health care provider. Write down: What is normal for each breast. Any changes that you find in each breast. These include: The kind of changes you find. Any pain or tenderness. Size and location of any lumps. Where you are in your menstrual cycle, if you are still getting your menstrual period (menstruating). General tips If you are breastfeeding, the best time to examine your breasts is after a feeding or after using a breast pump. If you menstruate, the best time to examine your breasts is 5-7 days after your menstrual period. Breasts are generally lumpier during menstrual periods, and it may be more difficult to notice changes. With time and practice, you will become more familiar with  the differences in your breasts and more comfortable with the exam. Contact a health care provider if: You see a change in the shape or size of your breasts or nipples. You see a change in the skin of your breast or nipples, such as a reddened or scaly area. You have unusual discharge from your nipples. You find a new lump or thick area. You have breast pain. You have any concerns about your  breast health. Summary Breast self-awareness includes looking for physical changes in your breasts and feeling for any changes within your breasts. Breast self-awareness should be done in front of a mirror in a well-lit room. If you menstruate, the best time to examine your breasts is 5-7 days after your menstrual period. Tell your health care provider about any changes you notice in your breasts. Changes include changes in size, changes on the skin, pain or tenderness, or unusual fluid from your nipples. This information is not intended to replace advice given to you by your health care provider. Make sure you discuss any questions you have with your health care provider. Document Revised: 04/09/2022 Document Reviewed: 09/04/2021 Elsevier Patient Education  2024 [employer].

## 2023-07-18 DIAGNOSIS — Z419 Encounter for procedure for purposes other than remedying health state, unspecified: Secondary | ICD-10-CM | POA: Diagnosis not present

## 2023-07-29 ENCOUNTER — Other Ambulatory Visit: Payer: Self-pay

## 2023-08-04 ENCOUNTER — Telehealth: Payer: Self-pay

## 2023-08-04 NOTE — Telephone Encounter (Signed)
LVM for patient to call back [phone], or to call PCP office to schedule follow up apt. AS, CMA  

## 2023-08-05 ENCOUNTER — Other Ambulatory Visit: Payer: Medicaid Other

## 2023-08-11 NOTE — Progress Notes (Deleted)
Cardiology Office Note:  .   Date:  08/11/2023  ID:  Samantha Evans, DOB Jun 09, 1961, MRN [id number] PCP: Hoy Register, MD  [facility] Providers Cardiologist:  Debbe Odea, MD { Click to update primary MD,subspecialty MD or APP then REFRESH:1}   History of Present Illness: .   Samantha Evans is a 62 y.o. female with a past medical history of hypertension, current smoker x 40+ years, arthritis, chronic hepatitis C, GERD, peripheral edema, who is here today for follow-up.  Last seen in clinic 06/04/23 by Dr Azucena Cecil.  She was referred for an abnormal EKG needing cardiac clearance for upcoming knee surgery.  She is symptomatic from right knee pain from osteoarthritis.  Surgical intervention was planned for October 2024.  Preop EKG showed possible old septal infarct.  She denied any chest pain or shortness of breath.  Family history of coronary artery disease in her father at the age of 41.  She was scheduled for an echocardiogram which revealed an LVEF of 60 to 65%, no RWMA, G1 DD, and mild mitral regurgitation.  She also underwent a Lexiscan MPI which showed no significant ischemia was considered a low risk scan.  At her appointment she was cleared for surgery.  She returns to clinic today  ROS: 10 point review of systems has been reviewed and is considered negative with exception of what is been listed in the HPI  Studies Reviewed: Marland Kitchen       TTE 07/05/23 1. Left ventricular ejection fraction, by estimation, is 60 to 65%. The  left ventricle has normal function. The left ventricle has no regional  wall motion abnormalities. Left ventricular diastolic parameters are  consistent with Grade I diastolic  dysfunction (impaired relaxation). The average left ventricular global  longitudinal strain is -24.6 %. The global longitudinal strain is normal.   2. Right ventricular systolic function is normal. The right ventricular  size is normal.   3. The mitral valve is normal in  structure. Mild mitral valve  regurgitation.   4. The aortic valve is tricuspid. Aortic valve regurgitation is not  visualized. Aortic valve sclerosis/calcification is present, without any  evidence of aortic stenosis.   5. The inferior vena cava is normal in size with greater than 50%  respiratory variability, suggesting right atrial pressure of 3 mmHg.   Lexiscan MPI 06/17/23 Pharmacological myocardial perfusion imaging study with no significant  ischemia Normal wall motion, EF estimated at 84% No EKG changes concerning for ischemia at peak stress or in recovery. CT attenuation correction images no significant coronary calcification, no significant aortic atherosclerosis Low risk scan Risk Assessment/Calculations:     No BP recorded.  {Refresh Note OR Click here to enter BP  :1}***       Physical Exam:   VS:  There were no vitals taken for this visit.   Wt Readings from Last 3 Encounters:  07/06/23 188 lb 12.8 oz (85.6 kg)  06/30/23 193 lb 6.4 oz (87.7 kg)  06/04/23 190 lb 9.6 oz (86.5 kg)    GEN: Well nourished, well developed in no acute distress NECK: No JVD; No carotid bruits CARDIAC: ***RRR, no murmurs, rubs, gallops RESPIRATORY:  Clear to auscultation without rales, wheezing or rhonchi  ABDOMEN: Soft, non-tender, non-distended EXTREMITIES:  No edema; No deformity   ASSESSMENT AND PLAN: .   ***    {Are you ordering a CV Procedure (e.g. stress test, cath, DCCV, TEE, etc)?   Press F2        :  [id number]}  Dispo: ***  Signed, Tahmid Stonehocker, NP

## 2023-08-12 ENCOUNTER — Ambulatory Visit: Payer: Medicaid Other | Admitting: "Endocrinology

## 2023-08-13 ENCOUNTER — Ambulatory Visit: Payer: Medicaid Other | Admitting: Cardiology

## 2023-08-16 ENCOUNTER — Ambulatory Visit: Payer: Medicaid Other

## 2023-08-17 DIAGNOSIS — Z419 Encounter for procedure for purposes other than remedying health state, unspecified: Secondary | ICD-10-CM | POA: Diagnosis not present

## 2023-08-24 NOTE — Pre-Procedure Instructions (Signed)
Surgical Instructions   Your procedure is scheduled on Monday, October 21st. Report to [facility] Main Entrance "A" at 05:30 A.M., then check in with the Admitting office. Any questions or running late day of surgery: call [phone]  Questions prior to your surgery date: call [phone], Monday-Friday, 8am-4pm. If you experience any cold or flu symptoms such as cough, fever, chills, shortness of breath, etc. between now and your scheduled surgery, please notify us at the above number.     Remember:  Do not eat after midnight the night before your surgery  You may drink clear liquids until 04:30 AM the morning of your surgery.   Clear liquids allowed are: Water, Non-Citrus Juices (without pulp), Carbonated Beverages, Clear Tea, Black Coffee Only (NO MILK, CREAM OR POWDERED CREAMER of any kind), and Gatorade.  Patient Instructions  The night before surgery:  No food after midnight. ONLY clear liquids after midnight  The day of surgery (if you do NOT have diabetes):  Drink ONE (1) Pre-Surgery Clear Ensure by 04:30 AM the morning of surgery. Drink in one sitting. Do not sip.  This drink was given to you during your hospital  pre-op appointment visit.  Nothing else to drink after completing the  Pre-Surgery Clear Ensure.          If you have questions, please contact your surgeon's office.    Take these medicines the morning of surgery with A SIP OF WATER  amLODipine (NORVASC)     One week prior to surgery, STOP taking any Aspirin (unless otherwise instructed by your surgeon) Aleve, Naproxen, Ibuprofen, Motrin, Advil, Goody's, BC's, all herbal medications, fish oil, and non-prescription vitamins.                     Do NOT Smoke (Tobacco/Vaping) for 24 hours prior to your procedure.  If you use a CPAP at night, you may bring your mask/headgear for your overnight stay.   You will be asked to remove any contacts, glasses, piercing's, hearing aid's, dentures/partials prior  to surgery. Please bring cases for these items if needed.    Patients discharged the day of surgery will not be allowed to drive home, and someone needs to stay with them for 24 hours.  SURGICAL WAITING ROOM VISITATION Patients may have no more than 2 support people in the waiting area - these visitors may rotate.   Pre-op nurse will coordinate an appropriate time for 1 ADULT support person, who may not rotate, to accompany patient in pre-op.  Children under the age of 76 must have an adult with them who is not the patient and must remain in the main waiting area with an adult.  If the patient needs to stay at the hospital during part of their recovery, the visitor guidelines for inpatient rooms apply.  Please refer to the [facility] website for the visitor guidelines for any additional information.   If you received a COVID test during your pre-op visit  it is requested that you wear a mask when out in public, stay away from anyone that may not be feeling well and notify your surgeon if you develop symptoms. If you have been in contact with anyone that has tested positive in the last 10 days please notify you surgeon.      Pre-operative 5 CHG Bathing Instructions   You can play a key role in reducing the risk of infection after surgery. Your skin needs to be as free of germs as possible.  You can reduce the number of germs on your skin by washing with CHG (chlorhexidine gluconate) soap before surgery. CHG is an antiseptic soap that kills germs and continues to kill germs even after washing.   DO NOT use if you have an allergy to chlorhexidine/CHG or antibacterial soaps. If your skin becomes reddened or irritated, stop using the CHG and notify one of our RNs at [phone].   Please shower with the CHG soap starting 4 days before surgery using the following schedule:     Please keep in mind the following:  DO NOT shave, including legs and underarms, starting the day of your first  shower.   You may shave your face at any point before/day of surgery.  Place clean sheets on your bed the day you start using CHG soap. Use a clean washcloth (not used since being washed) for each shower. DO NOT sleep with pets once you start using the CHG.   CHG Shower Instructions:  Wash your face and private area with normal soap. If you choose to wash your hair, wash first with your normal shampoo.  After you use shampoo/soap, rinse your hair and body thoroughly to remove shampoo/soap residue.  Turn the water OFF and apply about 3 tablespoons (45 ml) of CHG soap to a CLEAN washcloth.  Apply CHG soap ONLY FROM YOUR NECK DOWN TO YOUR TOES (washing for 3-5 minutes)  DO NOT use CHG soap on face, private areas, open wounds, or sores.  Pay special attention to the area where your surgery is being performed.  If you are having back surgery, having someone wash your back for you may be helpful. Wait 2 minutes after CHG soap is applied, then you may rinse off the CHG soap.  Pat dry with a clean towel  Put on clean clothes/pajamas   If you choose to wear lotion, please use ONLY the CHG-compatible lotions on the back of this paper.   Additional instructions for the day of surgery: DO NOT APPLY any lotions, deodorants, cologne, or perfumes.   Do not bring valuables to the hospital. [facility] is not responsible for any belongings/valuables. Do not wear nail polish, gel polish, artificial nails, or any other type of covering on natural nails (fingers and toes) Do not wear jewelry or makeup Put on clean/comfortable clothes.  Please brush your teeth.  Ask your nurse before applying any prescription medications to the skin.     CHG Compatible Lotions   Aveeno Moisturizing lotion  Cetaphil Moisturizing Cream  Cetaphil Moisturizing Lotion  Clairol Herbal Essence Moisturizing Lotion, Dry Skin  Clairol Herbal Essence Moisturizing Lotion, Extra Dry Skin  Clairol Herbal Essence Moisturizing  Lotion, Normal Skin  Curel Age Defying Therapeutic Moisturizing Lotion with Alpha Hydroxy  Curel Extreme Care Body Lotion  Curel Soothing Hands Moisturizing Hand Lotion  Curel Therapeutic Moisturizing Cream, Fragrance-Free  Curel Therapeutic Moisturizing Lotion, Fragrance-Free  Curel Therapeutic Moisturizing Lotion, Original Formula  Eucerin Daily Replenishing Lotion  Eucerin Dry Skin Therapy Plus Alpha Hydroxy Crme  Eucerin Dry Skin Therapy Plus Alpha Hydroxy Lotion  Eucerin Original Crme  Eucerin Original Lotion  Eucerin Plus Crme Eucerin Plus Lotion  Eucerin TriLipid Replenishing Lotion  Keri Anti-Bacterial Hand Lotion  Keri Deep Conditioning Original Lotion Dry Skin Formula Softly Scented  Keri Deep Conditioning Original Lotion, Fragrance Free Sensitive Skin Formula  Keri Lotion Fast Absorbing Fragrance Free Sensitive Skin Formula  Keri Lotion Fast Absorbing Softly Scented Dry Skin Formula  Keri Original Lotion  Keri Skin  Renewal Lotion WellPoint Smooth Lotion  Keri Silky Smooth Sensitive Skin Lotion  Nivea Body Creamy Conditioning Oil  Nivea Body Extra Enriched Teacher, adult education Moisturizing Lotion Nivea Crme  Nivea Skin Firming Lotion  NutraDerm 30 Skin Lotion  NutraDerm Skin Lotion  NutraDerm Therapeutic Skin Cream  NutraDerm Therapeutic Skin Lotion  ProShield Protective Hand Cream  Provon moisturizing lotion  Please read over the following fact sheets that you were given.

## 2023-08-25 ENCOUNTER — Other Ambulatory Visit: Payer: Self-pay

## 2023-08-25 ENCOUNTER — Encounter (HOSPITAL_COMMUNITY)
Admission: RE | Admit: 2023-08-25 | Discharge: 2023-08-25 | Disposition: A | Discharge status: Home / Self Care | Payer: Medicaid Other | Source: Ambulatory Visit | Attending: Orthopaedic Surgery | Admitting: Orthopaedic Surgery

## 2023-08-25 ENCOUNTER — Encounter (HOSPITAL_COMMUNITY): Payer: Self-pay

## 2023-08-25 VITALS — BP 152/83 | HR 78 | Temp 98.2°F | Resp 17 | Ht 61.0 in | Wt 194.1 lb

## 2023-08-25 DIAGNOSIS — I10 Essential (primary) hypertension: Secondary | ICD-10-CM | POA: Diagnosis not present

## 2023-08-25 DIAGNOSIS — Z7689 Persons encountering health services in other specified circumstances: Secondary | ICD-10-CM | POA: Diagnosis not present

## 2023-08-25 DIAGNOSIS — M1711 Unilateral primary osteoarthritis, right knee: Secondary | ICD-10-CM | POA: Diagnosis not present

## 2023-08-25 DIAGNOSIS — Z01812 Encounter for preprocedural laboratory examination: Secondary | ICD-10-CM | POA: Diagnosis not present

## 2023-08-25 DIAGNOSIS — B182 Chronic viral hepatitis C: Secondary | ICD-10-CM | POA: Diagnosis not present

## 2023-08-25 DIAGNOSIS — F17210 Nicotine dependence, cigarettes, uncomplicated: Secondary | ICD-10-CM | POA: Insufficient documentation

## 2023-08-25 DIAGNOSIS — Z01818 Encounter for other preprocedural examination: Secondary | ICD-10-CM

## 2023-08-25 DIAGNOSIS — K7460 Unspecified cirrhosis of liver: Secondary | ICD-10-CM | POA: Insufficient documentation

## 2023-08-25 LAB — COMPREHENSIVE METABOLIC PANEL
ALT: 23 U/L (ref 0–44)
AST: 31 U/L (ref 15–41)
Albumin: 3.7 g/dL (ref 3.5–5.0)
Alkaline Phosphatase: 77 U/L (ref 38–126)
Anion gap: 8 (ref 5–15)
BUN: 19 mg/dL (ref 8–23)
CO2: 26 mmol/L (ref 22–32)
Calcium: 11.6 mg/dL — ABNORMAL HIGH (ref 8.9–10.3)
Chloride: 106 mmol/L (ref 98–111)
Creatinine, Ser: 0.97 mg/dL (ref 0.44–1.00)
GFR, Estimated: >60 mL/min (ref >60)
Glucose, Bld: 113 mg/dL — ABNORMAL HIGH (ref 70–99)
Potassium: 4.1 mmol/L (ref 3.5–5.1)
Sodium: 140 mmol/L (ref 135–145)
Total Bilirubin: 1.1 mg/dL (ref 0.3–1.2)
Total Protein: 7.2 g/dL (ref 6.5–8.1)

## 2023-08-25 LAB — CBC
HCT: 40.8 % (ref 36.0–46.0)
Hemoglobin: 14.1 g/dL (ref 12.0–15.0)
MCH: 31.3 pg (ref 26.0–34.0)
MCHC: 34.6 g/dL (ref 30.0–36.0)
MCV: 90.7 fL (ref 80.0–100.0)
Platelets: 105 10*3/uL — ABNORMAL LOW (ref 150–400)
RBC: 4.5 MIL/uL (ref 3.87–5.11)
RDW: 13.2 % (ref 11.5–15.5)
WBC: 4.7 10*3/uL (ref 4.0–10.5)
nRBC: 0 % (ref 0.0–0.2)

## 2023-08-25 LAB — SURGICAL PCR SCREEN
MRSA, PCR: NEGATIVE
Staphylococcus aureus: NEGATIVE

## 2023-08-25 NOTE — Progress Notes (Signed)
PCP - Dr. Hoy Register Cardiologist - denies  PPM/ICD - denies   Chest x-ray - 02/23/10 EKG - 06/04/23 Stress Test - 06/17/23 ECHO - 07/05/23 Cardiac Cath - denies  Sleep Study - denies   DM- denies  ASA/Blood Thinner Instructions: n/a   ERAS Protcol -yes PRE-SURGERY Ensure given at PAT  COVID TEST- n/a   Anesthesia review: yes, pt saw cardiology for clearance due to abnormal EKG. Pt had ECHO and stress test per cardiology orders. Pt states she was cleared from a cardiology standpoint and does not have to go back to see them  Patient denies shortness of breath, fever, cough and chest pain at PAT appointment   All instructions explained to the patient, with a verbal understanding of the material. Patient agrees to go over the instructions while at home for a better understanding.  The opportunity to ask questions was provided.

## 2023-08-26 DIAGNOSIS — R32 Unspecified urinary incontinence: Secondary | ICD-10-CM | POA: Diagnosis not present

## 2023-08-26 NOTE — Anesthesia Preprocedure Evaluation (Addendum)
Anesthesia Evaluation  Patient identified by MRN, date of birth, ID band Patient awake    Reviewed: Allergy & Precautions, NPO status , Patient's Chart, lab work & pertinent test results  History of Anesthesia Complications Negative for: history of anesthetic complications  Airway Mallampati: II  TM Distance: >3 FB Neck ROM: Full    Dental  (+) Edentulous Lower, Edentulous Upper   Pulmonary Current Smoker   Pulmonary exam normal        Cardiovascular hypertension, Pt. on medications Normal cardiovascular exam     Neuro/Psych negative neurological ROS     GI/Hepatic negative GI ROS,,,(+) Cirrhosis       , Hepatitis -, C  Endo/Other  negative endocrine ROS    Renal/GU negative Renal ROS     Musculoskeletal  (+) Arthritis ,    Abdominal   Peds  Hematology Plts 105k   Anesthesia Other Findings Day of surgery medications reviewed with patient.  Reproductive/Obstetrics                              Anesthesia Physical Anesthesia Plan  ASA: 3  Anesthesia Plan: General   Post-op Pain Management: Tylenol PO (pre-op)*, Regional block* and Dilaudid IV   Induction: Intravenous  PONV Risk Score and Plan: 2 and Treatment may vary due to age or medical condition, Ondansetron, Dexamethasone and Midazolam  Airway Management Planned: LMA  Additional Equipment: None  Intra-op Plan:   Post-operative Plan: Extubation in OR  Informed Consent: I have reviewed the patients History and Physical, chart, labs and discussed the procedure including the risks, benefits and alternatives for the proposed anesthesia with the patient or authorized representative who has indicated his/her understanding and acceptance.     Dental advisory given  Plan Discussed with: CRNA  Anesthesia Plan Comments: (PAT note by Antionette Poles, PA-C: 62 year old female with pertinent history including current smoker,  hepatitis C (s/p treatment with Epclusa), cirrhosis, HTN, prior history of polysubstance abuse.  Patient was seen by cardiologist Dr. Azucena Cecil 06/04/2023 for preop evaluation in the setting of abnormal EKG.  Echo and stress test were ordered.  Chest test was low risk, echo showed EF 60 to 65%, grade 1 DD, mild much regurgitation.  Based on the results of the study she was cleared to proceed with surgery.  Preop labs reviewed, mild hypercalcemia with calcium 11.6, chronic thrombocytopenia with platelets 105k, otherwise unremarkable.  EKG 06/04/2023: Sinus rhythm with first-degree AV block.  Rate 79.  Septal infarct.  TEE 07/05/2023:  1. Left ventricular ejection fraction, by estimation, is 60 to 65%. The  left ventricle has normal function. The left ventricle has no regional  wall motion abnormalities. Left ventricular diastolic parameters are  consistent with Grade I diastolic  dysfunction (impaired relaxation). The average left ventricular global  longitudinal strain is -24.6 %. The global longitudinal strain is normal.   2. Right ventricular systolic function is normal. The right ventricular  size is normal.   3. The mitral valve is normal in structure. Mild mitral valve  regurgitation.   4. The aortic valve is tricuspid. Aortic valve regurgitation is not  visualized. Aortic valve sclerosis/calcification is present, without any  evidence of aortic stenosis.   5. The inferior vena cava is normal in size with greater than 50%  respiratory variability, suggesting right atrial pressure of 3 mmHg.   Nuclear stress 06/17/2023: Pharmacological myocardial perfusion imaging study with no significant  ischemia Normal wall motion, EF  estimated at 84% No EKG changes concerning for ischemia at peak stress or in recovery. CT attenuation correction images no significant coronary calcification, no significant aortic atherosclerosis Low risk scan   )         Anesthesia Quick Evaluation

## 2023-08-26 NOTE — Progress Notes (Signed)
Anesthesia Chart Review:  62 year old female with pertinent history including current smoker, hepatitis C (s/p treatment with Epclusa), cirrhosis, HTN, prior history of polysubstance abuse.  Patient was seen by cardiologist Dr. Azucena Cecil 06/04/2023 for preop evaluation in the setting of abnormal EKG.  Echo and stress test were ordered.  Chest test was low risk, echo showed EF 60 to 65%, grade 1 DD, mild much regurgitation.  Based on the results of the study she was cleared to proceed with surgery.  Preop labs reviewed, mild hypercalcemia with calcium 11.6, chronic thrombocytopenia with platelets 105k, otherwise unremarkable.  EKG 06/04/2023: Sinus rhythm with first-degree AV block.  Rate 79.  Septal infarct.  TEE 07/05/2023:  1. Left ventricular ejection fraction, by estimation, is 60 to 65%. The  left ventricle has normal function. The left ventricle has no regional  wall motion abnormalities. Left ventricular diastolic parameters are  consistent with Grade I diastolic  dysfunction (impaired relaxation). The average left ventricular global  longitudinal strain is -24.6 %. The global longitudinal strain is normal.   2. Right ventricular systolic function is normal. The right ventricular  size is normal.   3. The mitral valve is normal in structure. Mild mitral valve  regurgitation.   4. The aortic valve is tricuspid. Aortic valve regurgitation is not  visualized. Aortic valve sclerosis/calcification is present, without any  evidence of aortic stenosis.   5. The inferior vena cava is normal in size with greater than 50%  respiratory variability, suggesting right atrial pressure of 3 mmHg.   Nuclear stress 06/17/2023: Pharmacological myocardial perfusion imaging study with no significant  ischemia Normal wall motion, EF estimated at 84% No EKG changes concerning for ischemia at peak stress or in recovery. CT attenuation correction images no significant coronary calcification, no significant  aortic atherosclerosis Low risk scan    Zannie Cove [facility] Short Stay Center/Anesthesiology Phone [phone] 08/26/2023 3:09 PM

## 2023-09-01 ENCOUNTER — Other Ambulatory Visit: Payer: Self-pay | Admitting: Physician Assistant

## 2023-09-01 MED ORDER — DOCUSATE SODIUM 100 MG PO CAPS: 100.0000 mg | ORAL_CAPSULE | Freq: Every day | ORAL | 2 refills | Status: AC | PRN

## 2023-09-01 MED ORDER — ONDANSETRON HCL 4 MG PO TABS: 4.0000 mg | ORAL_TABLET | Freq: Three times a day (TID) | ORAL | 0 refills | Status: AC | PRN

## 2023-09-01 MED ORDER — OXYCODONE-ACETAMINOPHEN 7.5-325 MG PO TABS: 1.0000 | ORAL_TABLET | ORAL | 0 refills | Status: DC | PRN

## 2023-09-01 MED ORDER — METHOCARBAMOL 750 MG PO TABS: 750.0000 mg | ORAL_TABLET | Freq: Two times a day (BID) | ORAL | 2 refills | Status: AC | PRN

## 2023-09-01 MED ORDER — ASPIRIN 81 MG PO TBEC: 81.0000 mg | DELAYED_RELEASE_TABLET | Freq: Two times a day (BID) | ORAL | 0 refills | Status: DC | PRN

## 2023-09-03 MED ORDER — TRANEXAMIC ACID 1000 MG/10ML IV SOLN
2000.0000 mg | INTRAVENOUS | Status: AC
  Filled 2023-09-03: qty 20

## 2023-09-06 ENCOUNTER — Observation Stay (HOSPITAL_COMMUNITY): Payer: Medicaid Other

## 2023-09-06 ENCOUNTER — Other Ambulatory Visit: Payer: Self-pay

## 2023-09-06 ENCOUNTER — Ambulatory Visit (HOSPITAL_BASED_OUTPATIENT_CLINIC_OR_DEPARTMENT_OTHER): Payer: Medicaid Other | Admitting: Anesthesiology

## 2023-09-06 ENCOUNTER — Encounter (HOSPITAL_COMMUNITY): Payer: Self-pay | Admitting: Orthopaedic Surgery

## 2023-09-06 ENCOUNTER — Encounter (HOSPITAL_COMMUNITY)
Admission: RE | Disposition: A | Discharge status: Home / Self Care | Payer: Self-pay | Source: Home / Self Care | Attending: Orthopaedic Surgery

## 2023-09-06 ENCOUNTER — Other Ambulatory Visit: Payer: Self-pay | Admitting: Physician Assistant

## 2023-09-06 ENCOUNTER — Ambulatory Visit (HOSPITAL_COMMUNITY): Payer: Medicaid Other | Admitting: Physician Assistant

## 2023-09-06 ENCOUNTER — Other Ambulatory Visit (HOSPITAL_COMMUNITY): Payer: Self-pay

## 2023-09-06 ENCOUNTER — Observation Stay (HOSPITAL_COMMUNITY)
Admission: RE | Admit: 2023-09-06 | Discharge: 2023-09-07 | Disposition: A | Discharge status: Home / Self Care | Payer: Medicaid Other | Attending: Orthopaedic Surgery | Admitting: Orthopaedic Surgery

## 2023-09-06 DIAGNOSIS — M1711 Unilateral primary osteoarthritis, right knee: Principal | ICD-10-CM | POA: Diagnosis present

## 2023-09-06 DIAGNOSIS — F17210 Nicotine dependence, cigarettes, uncomplicated: Secondary | ICD-10-CM | POA: Insufficient documentation

## 2023-09-06 DIAGNOSIS — R609 Edema, unspecified: Secondary | ICD-10-CM | POA: Diagnosis not present

## 2023-09-06 DIAGNOSIS — Z96651 Presence of right artificial knee joint: Secondary | ICD-10-CM | POA: Diagnosis not present

## 2023-09-06 DIAGNOSIS — M25561 Pain in right knee: Secondary | ICD-10-CM | POA: Diagnosis not present

## 2023-09-06 DIAGNOSIS — I10 Essential (primary) hypertension: Secondary | ICD-10-CM | POA: Diagnosis not present

## 2023-09-06 DIAGNOSIS — Z79899 Other long term (current) drug therapy: Secondary | ICD-10-CM | POA: Diagnosis not present

## 2023-09-06 DIAGNOSIS — S82401A Unspecified fracture of shaft of right fibula, initial encounter for closed fracture: Secondary | ICD-10-CM | POA: Diagnosis not present

## 2023-09-06 DIAGNOSIS — G8918 Other acute postprocedural pain: Secondary | ICD-10-CM | POA: Diagnosis not present

## 2023-09-06 HISTORY — PX: TOTAL KNEE ARTHROPLASTY: SHX125

## 2023-09-06 SURGERY — ARTHROPLASTY, KNEE, TOTAL
Anesthesia: General | Site: Knee | Laterality: Right

## 2023-09-06 MED ORDER — HYDROMORPHONE HCL 1 MG/ML IJ SOLN
0.2500 mg | INTRAMUSCULAR | Status: DC | PRN
Start: 2023-09-06 — End: 2023-09-06
  Administered 2023-09-06 (×2): 0.5 mg via INTRAVENOUS

## 2023-09-06 MED ORDER — ONDANSETRON HCL 4 MG/2ML IJ SOLN
4.0000 mg | Freq: Four times a day (QID) | INTRAMUSCULAR | Status: DC | PRN
  Administered 2023-09-06: 4 mg via INTRAVENOUS
  Filled 2023-09-06: qty 2

## 2023-09-06 MED ORDER — OXYCODONE HCL ER 10 MG PO T12A
10.0000 mg | EXTENDED_RELEASE_TABLET | Freq: Two times a day (BID) | ORAL | Status: DC
  Administered 2023-09-06 (×2): 10 mg via ORAL
  Filled 2023-09-06 (×2): qty 1

## 2023-09-06 MED ORDER — METHOCARBAMOL 500 MG PO TABS
500.0000 mg | ORAL_TABLET | Freq: Four times a day (QID) | ORAL | Status: DC | PRN
  Administered 2023-09-06 – 2023-09-07 (×3): 500 mg via ORAL
  Filled 2023-09-06 (×3): qty 1

## 2023-09-06 MED ORDER — DROPERIDOL 2.5 MG/ML IJ SOLN: 0.6250 mg | Freq: Once | INTRAMUSCULAR | Status: DC | PRN

## 2023-09-06 MED ORDER — BUPIVACAINE-EPINEPHRINE (PF) 0.5% -1:200000 IJ SOLN
INTRAMUSCULAR | Status: DC | PRN
  Administered 2023-09-06: 15 mL via PERINEURAL

## 2023-09-06 MED ORDER — ONDANSETRON HCL 4 MG PO TABS: 4.0000 mg | ORAL_TABLET | Freq: Four times a day (QID) | ORAL | Status: DC | PRN

## 2023-09-06 MED ORDER — ACETAMINOPHEN 500 MG PO TABS
1000.0000 mg | ORAL_TABLET | Freq: Four times a day (QID) | ORAL | Status: AC
  Administered 2023-09-06 – 2023-09-07 (×4): 1000 mg via ORAL
  Filled 2023-09-06 (×4): qty 2

## 2023-09-06 MED ORDER — OXYCODONE HCL 5 MG PO TABS
10.0000 mg | ORAL_TABLET | ORAL | Status: DC | PRN
Start: 2023-09-06 — End: 2023-09-07
  Administered 2023-09-06 – 2023-09-07 (×3): 10 mg via ORAL
  Filled 2023-09-06 (×3): qty 2

## 2023-09-06 MED ORDER — POVIDONE-IODINE 10 % EX SWAB
2.0000 | Freq: Once | CUTANEOUS | Status: AC
  Administered 2023-09-06: 2 via TOPICAL

## 2023-09-06 MED ORDER — LACTATED RINGERS IV SOLN: INTRAVENOUS | Status: DC

## 2023-09-06 MED ORDER — AMLODIPINE BESYLATE 5 MG PO TABS
5.0000 mg | ORAL_TABLET | Freq: Every day | ORAL | Status: DC
  Administered 2023-09-06: 5 mg via ORAL
  Filled 2023-09-06: qty 1

## 2023-09-06 MED ORDER — KETOROLAC TROMETHAMINE 30 MG/ML IJ SOLN
INTRAMUSCULAR | Status: DC | PRN
  Administered 2023-09-06: 30 mg via INTRAVENOUS

## 2023-09-06 MED ORDER — ASPIRIN 81 MG PO CHEW
81.0000 mg | CHEWABLE_TABLET | Freq: Two times a day (BID) | ORAL | Status: DC
  Administered 2023-09-06: 81 mg via ORAL
  Filled 2023-09-06: qty 1

## 2023-09-06 MED ORDER — DEXAMETHASONE SODIUM PHOSPHATE 10 MG/ML IJ SOLN
INTRAMUSCULAR | Status: DC | PRN
  Administered 2023-09-06: 10 mg via INTRAVENOUS

## 2023-09-06 MED ORDER — 0.9 % SODIUM CHLORIDE (POUR BTL) OPTIME
TOPICAL | Status: DC | PRN
  Administered 2023-09-06: 1000 mL

## 2023-09-06 MED ORDER — VANCOMYCIN HCL 1000 MG IV SOLR
INTRAVENOUS | Status: AC
  Filled 2023-09-06: qty 20

## 2023-09-06 MED ORDER — SPIRONOLACTONE 25 MG PO TABS: 50.0000 mg | ORAL_TABLET | Freq: Every day | ORAL | Status: DC

## 2023-09-06 MED ORDER — MENTHOL 3 MG MT LOZG: 1.0000 | LOZENGE | OROMUCOSAL | Status: DC | PRN

## 2023-09-06 MED ORDER — OXYCODONE HCL 5 MG PO TABS: 5.0000 mg | ORAL_TABLET | Freq: Once | ORAL | Status: DC | PRN

## 2023-09-06 MED ORDER — FENTANYL CITRATE (PF) 100 MCG/2ML IJ SOLN
INTRAMUSCULAR | Status: AC
  Filled 2023-09-06: qty 2

## 2023-09-06 MED ORDER — ONDANSETRON HCL 4 MG/2ML IJ SOLN
INTRAMUSCULAR | Status: AC
  Filled 2023-09-06: qty 2

## 2023-09-06 MED ORDER — BUPIVACAINE-MELOXICAM ER 400-12 MG/14ML IJ SOLN
INTRAMUSCULAR | Status: AC
  Filled 2023-09-06: qty 1

## 2023-09-06 MED ORDER — BUPIVACAINE-MELOXICAM ER 400-12 MG/14ML IJ SOLN
INTRAMUSCULAR | Status: DC | PRN
  Administered 2023-09-06: 400 mg

## 2023-09-06 MED ORDER — OXYCODONE-ACETAMINOPHEN 7.5-325 MG PO TABS
1.0000 | ORAL_TABLET | Freq: Four times a day (QID) | ORAL | 0 refills | Status: AC | PRN
Start: 2023-09-06 — End: 2024-09-05

## 2023-09-06 MED ORDER — FENTANYL CITRATE (PF) 250 MCG/5ML IJ SOLN
INTRAMUSCULAR | Status: AC
  Filled 2023-09-06: qty 5

## 2023-09-06 MED ORDER — LIDOCAINE 2% (20 MG/ML) 5 ML SYRINGE
INTRAMUSCULAR | Status: DC | PRN
  Administered 2023-09-06: 100 mg via INTRAVENOUS

## 2023-09-06 MED ORDER — HYDROMORPHONE HCL 1 MG/ML IJ SOLN
INTRAMUSCULAR | Status: AC
  Filled 2023-09-06: qty 1

## 2023-09-06 MED ORDER — DEXAMETHASONE SODIUM PHOSPHATE 10 MG/ML IJ SOLN
INTRAMUSCULAR | Status: AC
  Filled 2023-09-06: qty 1

## 2023-09-06 MED ORDER — ONDANSETRON HCL 4 MG/2ML IJ SOLN
INTRAMUSCULAR | Status: DC | PRN
  Administered 2023-09-06: 4 mg via INTRAVENOUS

## 2023-09-06 MED ORDER — TRANEXAMIC ACID-NACL 1000-0.7 MG/100ML-% IV SOLN
1000.0000 mg | INTRAVENOUS | Status: AC
  Administered 2023-09-06: 1000 mg via INTRAVENOUS
  Filled 2023-09-06: qty 100

## 2023-09-06 MED ORDER — OXYCODONE HCL 5 MG/5ML PO SOLN: 5.0000 mg | Freq: Once | ORAL | Status: DC | PRN

## 2023-09-06 MED ORDER — METOCLOPRAMIDE HCL 5 MG/ML IJ SOLN: 5.0000 mg | Freq: Three times a day (TID) | INTRAMUSCULAR | Status: DC | PRN

## 2023-09-06 MED ORDER — TRANEXAMIC ACID 1000 MG/10ML IV SOLN
INTRAVENOUS | Status: DC | PRN
  Administered 2023-09-06: 2000 mg via TOPICAL

## 2023-09-06 MED ORDER — PHENYLEPHRINE 80 MCG/ML (10ML) SYRINGE FOR IV PUSH (FOR BLOOD PRESSURE SUPPORT)
PREFILLED_SYRINGE | INTRAVENOUS | Status: DC | PRN
  Administered 2023-09-06 (×2): 80 ug via INTRAVENOUS
  Administered 2023-09-06: 160 ug via INTRAVENOUS
  Administered 2023-09-06 (×2): 80 ug via INTRAVENOUS

## 2023-09-06 MED ORDER — FENTANYL CITRATE (PF) 250 MCG/5ML IJ SOLN
INTRAMUSCULAR | Status: DC | PRN
  Administered 2023-09-06 (×2): 25 ug via INTRAVENOUS
  Administered 2023-09-06 (×2): 50 ug via INTRAVENOUS
  Administered 2023-09-06: 100 ug via INTRAVENOUS
  Administered 2023-09-06 (×5): 50 ug via INTRAVENOUS

## 2023-09-06 MED ORDER — CHLORHEXIDINE GLUCONATE 0.12 % MT SOLN
15.0000 mL | Freq: Once | OROMUCOSAL | Status: AC
  Administered 2023-09-06: 15 mL via OROMUCOSAL
  Filled 2023-09-06: qty 15

## 2023-09-06 MED ORDER — MIDAZOLAM HCL 2 MG/2ML IJ SOLN
INTRAMUSCULAR | Status: DC | PRN
  Administered 2023-09-06: 2 mg via INTRAVENOUS

## 2023-09-06 MED ORDER — ACETAMINOPHEN 325 MG PO TABS: 325.0000 mg | ORAL_TABLET | Freq: Four times a day (QID) | ORAL | Status: DC | PRN

## 2023-09-06 MED ORDER — TRANEXAMIC ACID-NACL 1000-0.7 MG/100ML-% IV SOLN
1000.0000 mg | Freq: Once | INTRAVENOUS | Status: AC
  Administered 2023-09-06: 1000 mg via INTRAVENOUS
  Filled 2023-09-06: qty 100

## 2023-09-06 MED ORDER — PROPOFOL 10 MG/ML IV BOLUS
INTRAVENOUS | Status: AC
  Filled 2023-09-06: qty 20

## 2023-09-06 MED ORDER — DEXAMETHASONE SODIUM PHOSPHATE 10 MG/ML IJ SOLN
10.0000 mg | Freq: Once | INTRAMUSCULAR | Status: AC
  Administered 2023-09-07: 10 mg via INTRAVENOUS
  Filled 2023-09-06: qty 1

## 2023-09-06 MED ORDER — ASPIRIN 81 MG PO TBEC
81.0000 mg | DELAYED_RELEASE_TABLET | Freq: Two times a day (BID) | ORAL | 0 refills | Status: AC
  Filled 2023-09-06: qty 84, 42d supply, fill #0

## 2023-09-06 MED ORDER — ORAL CARE MOUTH RINSE: 15.0000 mL | Freq: Once | OROMUCOSAL | Status: AC

## 2023-09-06 MED ORDER — CEFAZOLIN SODIUM-DEXTROSE 2-4 GM/100ML-% IV SOLN
2.0000 g | INTRAVENOUS | Status: AC
  Administered 2023-09-06: 2 g via INTRAVENOUS
  Filled 2023-09-06: qty 100

## 2023-09-06 MED ORDER — PHENOL 1.4 % MT LIQD: 1.0000 | OROMUCOSAL | Status: DC | PRN

## 2023-09-06 MED ORDER — OXYCODONE HCL 5 MG PO TABS
5.0000 mg | ORAL_TABLET | ORAL | Status: DC | PRN
  Administered 2023-09-06: 5 mg via ORAL
  Filled 2023-09-06: qty 2

## 2023-09-06 MED ORDER — PRONTOSAN WOUND IRRIGATION OPTIME
TOPICAL | Status: DC | PRN
  Administered 2023-09-06: 350 mL

## 2023-09-06 MED ORDER — MIDAZOLAM HCL 2 MG/2ML IJ SOLN
INTRAMUSCULAR | Status: AC
  Filled 2023-09-06: qty 2

## 2023-09-06 MED ORDER — KETOROLAC TROMETHAMINE 15 MG/ML IJ SOLN
15.0000 mg | Freq: Four times a day (QID) | INTRAMUSCULAR | Status: AC
  Administered 2023-09-06 – 2023-09-07 (×4): 15 mg via INTRAVENOUS
  Filled 2023-09-06 (×4): qty 1

## 2023-09-06 MED ORDER — METOCLOPRAMIDE HCL 5 MG PO TABS: 5.0000 mg | ORAL_TABLET | Freq: Three times a day (TID) | ORAL | Status: DC | PRN

## 2023-09-06 MED ORDER — VANCOMYCIN HCL 1000 MG IV SOLR
INTRAVENOUS | Status: DC | PRN
  Administered 2023-09-06: 1000 mg

## 2023-09-06 MED ORDER — HYDROMORPHONE HCL 1 MG/ML IJ SOLN
0.5000 mg | INTRAMUSCULAR | Status: DC | PRN
Start: 2023-09-06 — End: 2023-09-07

## 2023-09-06 MED ORDER — PROPOFOL 10 MG/ML IV BOLUS
INTRAVENOUS | Status: DC | PRN
  Administered 2023-09-06: 170 mg via INTRAVENOUS

## 2023-09-06 MED ORDER — DOCUSATE SODIUM 100 MG PO CAPS
100.0000 mg | ORAL_CAPSULE | Freq: Two times a day (BID) | ORAL | Status: DC
  Administered 2023-09-06 (×2): 100 mg via ORAL
  Filled 2023-09-06 (×2): qty 1

## 2023-09-06 MED ORDER — PHENYLEPHRINE 80 MCG/ML (10ML) SYRINGE FOR IV PUSH (FOR BLOOD PRESSURE SUPPORT)
PREFILLED_SYRINGE | INTRAVENOUS | Status: AC
  Filled 2023-09-06: qty 10

## 2023-09-06 MED ORDER — METHOCARBAMOL 1000 MG/10ML IJ SOLN: 500.0000 mg | Freq: Four times a day (QID) | INTRAMUSCULAR | Status: DC | PRN

## 2023-09-06 MED ORDER — SODIUM CHLORIDE 0.9 % IR SOLN
Status: DC | PRN
  Administered 2023-09-06: 1000 mL

## 2023-09-06 MED ORDER — FERROUS SULFATE 325 (65 FE) MG PO TABS
325.0000 mg | ORAL_TABLET | Freq: Three times a day (TID) | ORAL | Status: DC
  Administered 2023-09-06 (×2): 325 mg via ORAL
  Filled 2023-09-06 (×2): qty 1

## 2023-09-06 MED ORDER — LIDOCAINE 2% (20 MG/ML) 5 ML SYRINGE
INTRAMUSCULAR | Status: AC
  Filled 2023-09-06: qty 5

## 2023-09-06 MED ORDER — CEFAZOLIN SODIUM-DEXTROSE 2-4 GM/100ML-% IV SOLN
2.0000 g | Freq: Four times a day (QID) | INTRAVENOUS | Status: AC
  Administered 2023-09-06 (×2): 2 g via INTRAVENOUS
  Filled 2023-09-06 (×2): qty 100

## 2023-09-06 MED ORDER — ACETAMINOPHEN 500 MG PO TABS
1000.0000 mg | ORAL_TABLET | Freq: Once | ORAL | Status: AC
  Administered 2023-09-06: 1000 mg via ORAL
  Filled 2023-09-06: qty 2

## 2023-09-06 MED ORDER — FENTANYL CITRATE (PF) 100 MCG/2ML IJ SOLN
25.0000 ug | INTRAMUSCULAR | Status: DC | PRN
  Administered 2023-09-06 (×3): 50 ug via INTRAVENOUS

## 2023-09-06 MED ORDER — HYDROXYZINE HCL 50 MG/ML IM SOLN: 50.0000 mg | Freq: Four times a day (QID) | INTRAMUSCULAR | Status: DC | PRN

## 2023-09-06 SURGICAL SUPPLY — 85 items
ADH SKN CLS APL DERMABOND .7 (GAUZE/BANDAGES/DRESSINGS) ×1
ALCOHOL 70% 16 OZ (MISCELLANEOUS) ×1 IMPLANT
BAG COUNTER SPONGE SURGICOUNT (BAG) IMPLANT
BAG DECANTER FOR FLEXI CONT (MISCELLANEOUS) ×1 IMPLANT
BAG SPNG CNTER NS LX DISP (BAG)
BANDAGE ESMARK 6X9 LF (GAUZE/BANDAGES/DRESSINGS) IMPLANT
BLADE SAG 18X100X1.27 (BLADE) ×1 IMPLANT
BLADE SAW SGTL 73X25 THK (BLADE) ×1 IMPLANT
BNDG CMPR 9X6 STRL LF SNTH (GAUZE/BANDAGES/DRESSINGS)
BNDG ESMARK 6X9 LF (GAUZE/BANDAGES/DRESSINGS)
BOWL SMART MIX CTS (DISPOSABLE) ×1 IMPLANT
CLSR STERI-STRIP ANTIMIC 1/2X4 (GAUZE/BANDAGES/DRESSINGS) ×2 IMPLANT
COMP FEM PS KNEE NRW 8 RT (Joint) ×1 IMPLANT
COMP PATELLA 3 PEG 29X9 (Joint) ×1 IMPLANT
COMP TIB PS KNEE 0D E RT (Joint) ×1 IMPLANT
COMPONENT FEM PS KNEE NRW 8 RT (Joint) IMPLANT
COMPONENT PATELLA 3 PEG 29X9 (Joint) IMPLANT
COMPONET TIB PS KNEE 0D E RT (Joint) IMPLANT
COOLER ICEMAN CLASSIC (MISCELLANEOUS) ×1 IMPLANT
COVER SURGICAL LIGHT HANDLE (MISCELLANEOUS) ×1 IMPLANT
CUFF TOURN SGL QUICK 34 (TOURNIQUET CUFF) ×1
CUFF TOURN SGL QUICK 42 (TOURNIQUET CUFF) IMPLANT
CUFF TRNQT CYL 34X4.125X (TOURNIQUET CUFF) ×1 IMPLANT
DERMABOND ADVANCED .7 DNX12 (GAUZE/BANDAGES/DRESSINGS) ×1 IMPLANT
DRAPE EXTREMITY T 121X128X90 (DISPOSABLE) ×1 IMPLANT
DRAPE HALF SHEET 40X57 (DRAPES) ×1 IMPLANT
DRAPE INCISE IOBAN 66X45 STRL (DRAPES) ×1 IMPLANT
DRAPE ORTHO SPLIT 77X108 STRL (DRAPES)
DRAPE POUCH INSTRU U-SHP 10X18 (DRAPES) ×1 IMPLANT
DRAPE SURG ORHT 6 SPLT 77X108 (DRAPES) IMPLANT
DRAPE U-SHAPE 47X51 STRL (DRAPES) ×2 IMPLANT
DRSG AQUACEL AG ADV 3.5X10 (GAUZE/BANDAGES/DRESSINGS) ×1 IMPLANT
DURAPREP 26ML APPLICATOR (WOUND CARE) ×3 IMPLANT
ELECT CAUTERY BLADE 6.4 (BLADE) ×1 IMPLANT
ELECT PENCIL ROCKER SW 15FT (MISCELLANEOUS) ×1 IMPLANT
ELECT REM PT RETURN 9FT ADLT (ELECTROSURGICAL) ×1
ELECTRODE REM PT RTRN 9FT ADLT (ELECTROSURGICAL) ×1 IMPLANT
GLOVE BIOGEL PI IND STRL 7.0 (GLOVE) ×2 IMPLANT
GLOVE BIOGEL PI IND STRL 7.5 (GLOVE) ×5 IMPLANT
GLOVE ECLIPSE 7.0 STRL STRAW (GLOVE) ×3 IMPLANT
GLOVE INDICATOR 7.0 STRL GRN (GLOVE) ×1 IMPLANT
GLOVE INDICATOR 7.5 STRL GRN (GLOVE) ×1 IMPLANT
GLOVE SURG SYN 7.5 E (GLOVE) ×2 IMPLANT
GLOVE SURG SYN 7.5 PF PI (GLOVE) ×2 IMPLANT
GLOVE SURG UNDER LTX SZ7.5 (GLOVE) ×2 IMPLANT
GLOVE SURG UNDER POLY LF SZ7 (GLOVE) ×2 IMPLANT
GOWN STRL REUS W/ TWL LRG LVL3 (GOWN DISPOSABLE) ×1 IMPLANT
GOWN STRL REUS W/TWL LRG LVL3 (GOWN DISPOSABLE) ×1
GOWN STRL SURGICAL XL XLNG (GOWN DISPOSABLE) ×1 IMPLANT
GOWN TOGA ZIPPER T7+ PEEL AWAY (MISCELLANEOUS) ×2 IMPLANT
HANDPIECE INTERPULSE COAX TIP (DISPOSABLE) ×1
HOOD PEEL AWAY T7 (MISCELLANEOUS) ×1 IMPLANT
INSERT TIB ARTI SZ8-11 RT 11 (Joint) IMPLANT
KIT BASIN OR (CUSTOM PROCEDURE TRAY) ×1 IMPLANT
KIT TURNOVER KIT B (KITS) ×1 IMPLANT
MANIFOLD NEPTUNE II (INSTRUMENTS) ×1 IMPLANT
MARKER SKIN DUAL TIP RULER LAB (MISCELLANEOUS) ×2 IMPLANT
NDL SPNL 18GX3.5 QUINCKE PK (NEEDLE) ×1 IMPLANT
NEEDLE SPNL 18GX3.5 QUINCKE PK (NEEDLE) ×1 IMPLANT
NS IRRIG 1000ML POUR BTL (IV SOLUTION) ×1 IMPLANT
PACK TOTAL JOINT (CUSTOM PROCEDURE TRAY) ×1 IMPLANT
PAD ARMBOARD 7.5X6 YLW CONV (MISCELLANEOUS) ×2 IMPLANT
PAD COLD SHLDR WRAP-ON (PAD) ×1 IMPLANT
PIN DRILL HDLS TROCAR 75 4PK (PIN) IMPLANT
SCREW FEMALE HEX FIX 25X2.5 (ORTHOPEDIC DISPOSABLE SUPPLIES) IMPLANT
SET HNDPC FAN SPRY TIP SCT (DISPOSABLE) ×1 IMPLANT
SOLUTION PRONTOSAN WOUND 350ML (IRRIGATION / IRRIGATOR) ×1 IMPLANT
STAPLER VISISTAT 35W (STAPLE) IMPLANT
SUCTION TUBE FRAZIER 10FR DISP (SUCTIONS) ×1 IMPLANT
SUT ETHILON 2 0 FS 18 (SUTURE) IMPLANT
SUT MNCRL AB 3-0 PS2 27 (SUTURE) IMPLANT
SUT VIC AB 0 CT1 27 (SUTURE) ×2
SUT VIC AB 0 CT1 27XBRD ANBCTR (SUTURE) ×2 IMPLANT
SUT VIC AB 1 CT1 27 (SUTURE) ×1
SUT VIC AB 1 CT1 27XBRD ANBCTR (SUTURE) IMPLANT
SUT VIC AB 1 CTX 27 (SUTURE) ×3 IMPLANT
SUT VIC AB 2-0 CT1 27 (SUTURE) ×5
SUT VIC AB 2-0 CT1 TAPERPNT 27 (SUTURE) ×4 IMPLANT
SYR 50ML LL SCALE MARK (SYRINGE) ×2 IMPLANT
TOWEL GREEN STERILE (TOWEL DISPOSABLE) ×1 IMPLANT
TOWEL GREEN STERILE FF (TOWEL DISPOSABLE) ×1 IMPLANT
TRAY CATH INTERMITTENT SS 16FR (CATHETERS) IMPLANT
TUBE SUCT ARGYLE STRL (TUBING) ×1 IMPLANT
UNDERPAD 30X36 HEAVY ABSORB (UNDERPADS AND DIAPERS) ×1 IMPLANT
YANKAUER SUCT BULB TIP NO VENT (SUCTIONS) ×2 IMPLANT

## 2023-09-06 NOTE — Anesthesia Procedure Notes (Signed)
Anesthesia Regional Block: Adductor canal block   Pre-Anesthetic Checklist: , timeout performed,  Correct Patient, Correct Site, Correct Laterality,  Correct Procedure, Correct Position, site marked,  Risks and benefits discussed,  Pre-op evaluation,  At surgeon's request and post-op pain management  Laterality: Right  Prep: Maximum Sterile Barrier Precautions used, chloraprep       Needles:  Injection technique: Single-shot  Needle Type: Echogenic Stimulator Needle     Needle Length: 9cm  Needle Gauge: 22     Additional Needles:   Procedures:,,,, ultrasound used (permanent image in chart),,    Narrative:  Start time: 09/06/2023 6:48 AM End time: 09/06/2023 6:51 AM Injection made incrementally with aspirations every 5 mL.  Performed by: Personally  Anesthesiologist: Kaylyn Layer, MD  Additional Notes: Risks, benefits, and alternative discussed. Patient gave consent for procedure. Patient prepped and draped in sterile fashion. Sedation administered, patient remains easily responsive to voice. Relevant anatomy identified with ultrasound guidance. Local anesthetic given in 5cc increments with no signs or symptoms of intravascular injection. No pain or paraesthesias with injection. Patient monitored throughout procedure with signs of LAST or immediate complications. Tolerated well. Ultrasound image placed in chart.  Amalia Greenhouse, MD

## 2023-09-06 NOTE — H&P (Signed)
PREOPERATIVE H&P  Chief Complaint: right knee osteoarthritis  HPI: Samantha Evans is a 62 y.o. female who presents for surgical treatment of right knee osteoarthritis.  She denies any changes in medical history.  Past Surgical History:  Procedure Laterality Date   BREAST BIOPSY WITH RADIO FREQUENCY LOCALIZER Right 08/12/2022   Procedure: BREAST BIOPSY- Office manager;  Surgeon: Campbell Lerner, MD;  Location: [facility] ORS;  Service: General;  Laterality: Right;   CATARACT EXTRACTION Bilateral    CHOLECYSTECTOMY     COLONOSCOPY     ERCP     POLYPECTOMY     UMBILICAL HERNIA REPAIR N/A 02/27/2021   Procedure: REPAIR INCARCERATED UMBILICAL HERNIA;  Surgeon: Violeta Gelinas, MD;  Location: [facility] OR;  Service: General;  Laterality: N/A;   Social History   Socioeconomic History   Marital status: Legally Separated    Spouse name: Not on file   Number of children: 4   Years of education: Not on file   Highest education level: 12th grade  Occupational History   Occupation: unemployed  Tobacco Use   Smoking status: Every Day    Current packs/day: 0.50    Average packs/day: 0.5 packs/day for 40.0 years (20.0 ttl pk-yrs)    Types: Cigarettes    Passive exposure: Past   Smokeless tobacco: Never   Tobacco comments:    Quit smoking on 02/27/2021 when hernia repair surgery occurred.  Back to smoking .5 pack of cigarettes daily  Vaping Use   Vaping status: Never Used  Substance and Sexual Activity   Alcohol use: Not Currently   Drug use: Not Currently    Comment: quit using crack cocaine 20 years ago updated 10/26/22   Sexual activity: Not on file  Other Topics Concern   Not on file  Social History Narrative   Not on file   Social Determinants of Health   Financial Resource Strain: Medium Risk (03/25/2023)   Overall Financial Resource Strain (CARDIA)    Difficulty of Paying Living Expenses: Somewhat hard  Food Insecurity: Food Insecurity Present (03/25/2023)   Hunger Vital Sign     Worried About Running Out of Food in the Last Year: Often true    Ran Out of Food in the Last Year: Often true  Transportation Needs: No Transportation Needs (03/25/2023)   PRAPARE - Administrator, Civil Service (Medical): No    Lack of Transportation (Non-Medical): No  Physical Activity: Unknown (03/25/2023)   Exercise Vital Sign    Days of Exercise per Week: Patient declined    Minutes of Exercise per Session: Not on file  Stress: No Stress Concern Present (03/25/2023)   [employer] of Occupational Health - Occupational Stress Questionnaire    Feeling of Stress : Only a little  Social Connections: Unknown (03/25/2023)   Social Connection and Isolation Panel [NHANES]    Frequency of Communication with Friends and Family: Three times a week    Frequency of Social Gatherings with Friends and Family: Three times a week    Attends Religious Services: Patient declined    Active Member of Clubs or Organizations: No    Attends Engineer, structural: Not on file    Marital Status: Separated   Family History  Problem Relation Age of Onset   Other Mother        Hit by a train while in her truck   Heart disease Father    Lung cancer Father    Liver disease Neg Hx  Colon cancer Neg Hx    Esophageal cancer Neg Hx    Pancreatic cancer Neg Hx    Stomach cancer Neg Hx    Colon polyps Neg Hx    Crohn's disease Neg Hx    Rectal cancer Neg Hx    Ulcerative colitis Neg Hx    No Known Allergies Prior to Admission medications   Medication Sig Start Date End Date Taking? Authorizing Provider  amLODipine (NORVASC) 5 MG tablet Take 1 tablet (5 mg total) by mouth daily. 06/30/23  Yes Hoy Register, MD  aspirin EC 81 MG tablet Take 1 tablet (81 mg total) by mouth 2 (two) times daily as needed. To be taken after surgery to prevent surgery 09/01/23 08/31/24  Cristie Hem, PA-C  docusate sodium (COLACE) 100 MG capsule Take 1 capsule (100 mg total) by mouth daily as needed.  09/01/23 08/31/24  Cristie Hem, PA-C  Elastic Bandages & Supports (ABDOMINAL BINDER/ELASTIC XL) MISC 1 Container by Does not apply route as needed. 07/06/23  Yes Campbell Lerner, MD  ibuprofen (ADVIL) 200 MG tablet Take 400 mg by mouth every 6 (six) hours as needed for moderate pain.   Yes [provider]  Magnesium 250 MG TABS Take 500 mg by mouth daily.   Yes [provider]  methocarbamol (ROBAXIN-750) 750 MG tablet Take 1 tablet (750 mg total) by mouth 2 (two) times daily as needed for muscle spasms. 09/01/23   Cristie Hem, PA-C  ondansetron (ZOFRAN) 4 MG tablet Take 1 tablet (4 mg total) by mouth every 8 (eight) hours as needed for nausea or vomiting. 09/01/23   Cristie Hem, PA-C  oxyCODONE-acetaminophen (PERCOCET) 7.5-325 MG tablet Take 1 tablet by mouth every 4 (four) hours as needed for severe pain (pain score 7-10). To be taken after surgery 09/01/23 08/31/24  Cristie Hem, PA-C  spironolactone (ALDACTONE) 50 MG tablet Take 50 mg by mouth daily.   Yes [provider]     Positive ROS: All other systems have been reviewed and were otherwise negative with the exception of those mentioned in the HPI and as above.  Physical Exam: General: Alert, no acute distress Cardiovascular: No pedal edema Respiratory: No cyanosis, no use of accessory musculature GI: abdomen soft Skin: No lesions in the area of chief complaint Neurologic: Sensation intact distally Psychiatric: Patient is competent for consent with normal mood and affect Lymphatic: no lymphedema  MUSCULOSKELETAL: exam stable  Assessment: right knee osteoarthritis  Plan: Plan for Procedure(s): RIGHT TOTAL KNEE ARTHROPLASTY  The risks benefits and alternatives were discussed with the patient including but not limited to the risks of nonoperative treatment, versus surgical intervention including infection, bleeding, nerve injury,  blood clots, cardiopulmonary complications, morbidity,  mortality, among others, and they were willing to proceed.   Glee Arvin, MD 09/06/2023 6:25 AM

## 2023-09-06 NOTE — Transfer of Care (Signed)
Immediate Anesthesia Transfer of Care Note  Patient: Samantha Evans  Procedure(s) Performed: RIGHT TOTAL KNEE ARTHROPLASTY (Right: Knee)  Patient Location: PACU  Anesthesia Type:General  Level of Consciousness: awake, alert , oriented, and patient cooperative  Airway & Oxygen Therapy: Patient Spontanous Breathing and Patient connected to face mask oxygen  Post-op Assessment: Report given to RN, Post -op Vital signs reviewed and stable, and Patient moving all extremities  Post vital signs: Reviewed and stable  Last Vitals:  Vitals Value Taken Time  BP 150/74 09/06/23 0920  Temp 36.1 C 09/06/23 0920  Pulse 75 09/06/23 0927  Resp 13 09/06/23 0927  SpO2 96 % 09/06/23 0927  Vitals shown include unfiled device data.  Last Pain:  Vitals:   09/06/23 0920  PainSc: 10-Worst pain ever      Patients Stated Pain Goal: 3 (09/06/23 0920)  Complications: No notable events documented.

## 2023-09-06 NOTE — Anesthesia Procedure Notes (Signed)
Procedure Name: LMA Insertion Date/Time: 09/06/2023 7:31 AM  Performed by: Thomasene Ripple, CRNAPre-anesthesia Checklist: Patient identified, Emergency Drugs available, Suction available and Patient being monitored Patient Re-evaluated:Patient Re-evaluated prior to induction Oxygen Delivery Method: Circle System Utilized Preoxygenation: Pre-oxygenation with 100% oxygen Induction Type: IV induction Ventilation: Mask ventilation without difficulty LMA: LMA inserted LMA Size: 4.0 Number of attempts: 1 Airway Equipment and Method: Bite block Placement Confirmation: positive ETCO2 Tube secured with: Tape Dental Injury: Teeth and Oropharynx as per pre-operative assessment

## 2023-09-06 NOTE — Discharge Instructions (Signed)

## 2023-09-06 NOTE — Op Note (Signed)
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 CPT [phone]  Surgeon: N. Glee Arvin, MD  Assist: Oneal Grout, PA-C; necessary for the timely completion of procedure and due to complexity of procedure.  Anesthesia: Spinal, regional  Tourniquet time: see anesthesia record  Implants used: Zimmer persona press fit Femur: CR 8 narrow Tibia: E Patella: 29 mm Polyethylene: 11 mm, medial congruent  Indication: Samantha Evans is a 62 y.o. year old female with a history of knee pain. Having failed conservative management, the patient elected to proceed with a total knee arthroplasty.  We have reviewed the risk and benefits of the surgery and they elected to proceed after voicing understanding.  Procedure:  After informed consent was obtained and understanding of the risk were voiced including but not limited to bleeding, infection, damage to surrounding structures including nerves and vessels, blood clots, leg length inequality and the failure to achieve desired results, the operative extremity was marked with verbal confirmation of the patient in the holding area.   The patient was then brought to the operating room and transported to the operating room table in the supine position.  A tourniquet was applied to the operative extremity around the upper thigh. The operative limb was then prepped and draped in the usual sterile fashion and preoperative antibiotics were administered.  A time out was performed prior to the start of surgery confirming the correct extremity, preoperative antibiotic administration, as well as team members, implants and instruments available for the case. Correct surgical site was also confirmed with preoperative  radiographs. The limb was then elevated for exsanguination and the tourniquet was inflated. A midline incision was made and a standard medial parapatellar approach was performed.  The patella was everted which showed complete loss of articular cartilage.  The patella was prepared and sized to a 29 mm.  A cover was placed on the patella for protection from retractors.  We then turned our attention to the femur. Posterior cruciate ligament was first retained. Start site was drilled in the femur and the intramedullary distal femoral cutting guide was placed, set at 5 degrees valgus, taking 10 mm of distal resection. The distal cut was made. Osteophytes were then removed.   Next, the proximal tibial cutting guide was placed with appropriate slope, varus/valgus alignment and depth of resection. A drop rod was attached to confirm that it was pointed to the second metatarsal.  The proximal tibial cut was made taking 2 mm off the low side. Gap blocks were then used to assess the extension gap and alignment, and appropriate soft tissue releases were performed.  Attention was turned back to the femur, which was sized using the sizing guide to a size 8 narrow. Appropriate rotation of the femoral component was determined using epicondylar axis, Whiteside's line, and assessing the flexion gap under ligament tension. The appropriate size 4-in-1 cutting block was placed and cuts were made.  Posterior femoral osteophytes and uncapped bone were then removed with the curved osteotome.  Trial components were placed, and stability was checked in full extension, mid-flexion, and deep flexion.  She was a bit tight in flexion therefore I partially released the PCL from the femur.  Proper tibial rotation was determined and marked.  The patella tracked well without a lateral release. Trial components were then removed and tibial preparation performed.  The tibial bone quality was good.  The tibia was sized for a size E component.  The  bony surfaces were irrigated with a pulse lavage and then dried. Final components were placed.  The stability of the construct was re-evaluated throughout a range of motion and found to be acceptable. The trial liner was removed, the knee was copiously irrigated, and the knee was re-evaluated for any excess bone debris. The real polyethylene liner, 11 mm thick, was inserted and checked to ensure the locking mechanism had engaged appropriately. The tourniquet was deflated and hemostasis was achieved. The wound was irrigated with normal saline.  One gram of vancomycin powder was placed in the surgical bed.  Topical 0.25% bupivacaine and meloxicam was placed in the joint for postoperative pain.  Capsular closure was performed with a #1 vicryl at 45 degrees of flexion, subcutaneous fat closed with a 0 vicryl suture, then subcutaneous tissue closed with interrupted 2.0 vicryl suture. The skin was then closed with a 2.0 nylon and dermabond. A sterile dressing was applied.  The patient was awakened in the operating room and taken to recovery in stable condition. All sponge, needle, and instrument counts were correct at the end of the case.  Tessa Lerner was necessary for opening, closing, retracting, limb positioning and overall facilitation and completion of the surgery.  Position: supine  Complications: none.  Time Out: performed   Drains/Packing: none  Estimated blood loss: minimal  Returned to Recovery Room: in good condition.   Antibiotics: yes   Mechanical VTE (DVT) Prophylaxis: sequential compression devices, TED thigh-high  Chemical VTE (DVT) Prophylaxis: aspirin  Fluid Replacement  Crystalloid: see anesthesia record Blood: none  FFP: none   Specimens Removed: 1 to pathology   Sponge and Instrument Count Correct? yes   PACU: portable radiograph - knee AP and Lateral   Plan/RTC: Return in 2 weeks for wound check.   Weight Bearing/Load Lower Extremity: full   Implant Name Type  Inv. Item Serial No. Manufacturer Lot No. LRB No. Used Action  COMP PATELLA 3 PEG 29X9 - [id number] Joint COMP PATELLA 3 PEG 29X9  [employer](ORTH,TRAU,BIO,SG) [id number] Right 1 Implanted  COMP TIB PS KNEE 0D E RT - [id number] Joint COMP TIB PS KNEE 0D E RT  [employer](ORTH,TRAU,BIO,SG) [id number] Right 1 Implanted  COMP FEM PS KNEE NRW 8 RT - [id number] Joint COMP FEM PS KNEE NRW 8 RT  [employer](ORTH,TRAU,BIO,SG) [id number] Right 1 Implanted  INSERT TIB ARTI SZ8-11 RT 11 - [id number] Joint INSERT TIB ARTI SZ8-11 RT 11  [employer](ORTH,TRAU,BIO,SG) [id number] Right 1 Implanted    N. Glee Arvin, MD [facility] 8:54 AM

## 2023-09-06 NOTE — Anesthesia Postprocedure Evaluation (Signed)
Anesthesia Post Note  Patient: Samantha Evans  Procedure(s) Performed: RIGHT TOTAL KNEE ARTHROPLASTY (Right: Knee)     Patient location during evaluation: PACU Anesthesia Type: General Level of consciousness: awake and alert Pain management: pain level controlled Vital Signs Assessment: post-procedure vital signs reviewed and stable Respiratory status: spontaneous breathing, nonlabored ventilation and respiratory function stable Cardiovascular status: blood pressure returned to baseline Postop Assessment: no apparent nausea or vomiting Anesthetic complications: no   No notable events documented.  Last Vitals:  Vitals:   09/06/23 1015 09/06/23 1025  BP: 137/67   Pulse: 71   Resp: 16   Temp: (!) 36.1 C   SpO2: 92% 97%    Last Pain:  Vitals:   09/06/23 1015  PainSc: 6         RLE Motor Response: Purposeful movement (09/06/23 1015) RLE Sensation: Full sensation (09/06/23 1015)      [name]

## 2023-09-06 NOTE — Evaluation (Signed)
Physical Therapy Evaluation Patient Details Name: Samantha Evans MRN: [id number] DOB: 1960-12-27 Today's Date: 09/06/2023  History of Present Illness  62 y.o female presents to [facility] hospital on 09/06/2023 for elective R TKA. PMH includes hepatic cirrhosis, AKI, peripheral edema, liver lesion.  Clinical Impression  Pt presents to therapy with generalized weakness, limited mobility, and decreased ROM in the right LE. Pt was able to walk a short distance in the hospital room with the use of a RW. Pt ambulation was limited by right knee pain and nausea. Pt was educated on TKA exercises and precautions following surgery. PT will focus on improving the pt activity tolerance, gait and stair training during the session tomorrow. Pt will continue to benefit from skilled PT for frequent ambulation that will increase activity tolerance, strengthening, and ROM.      If plan is discharge home, recommend the following: A little help with walking and/or transfers;Assistance with cooking/housework;Assist for transportation;Help with stairs or ramp for entrance;A little help with bathing/dressing/bathroom   Can travel by private vehicle        Equipment Recommendations None recommended by PT  Recommendations for Other Services       Functional Status Assessment Patient has had a recent decline in their functional status and demonstrates the ability to make significant improvements in function in a reasonable and predictable amount of time.     Precautions / Restrictions Precautions Precautions: Knee Precaution Booklet Issued: Yes (comment) Restrictions Weight Bearing Restrictions: Yes RLE Weight Bearing: Weight bearing as tolerated      Mobility  Bed Mobility Overal bed mobility: Needs Assistance Bed Mobility: Supine to Sit, Sit to Supine     Supine to sit: Min assist Sit to supine: Min assist   General bed mobility comments: Therapist provided min assist with RLE during bed mobility     Transfers Overall transfer level: Needs assistance Equipment used: Rolling walker (2 wheels) Transfers: Sit to/from Stand Sit to Stand: Contact guard assist                Ambulation/Gait Ambulation/Gait assistance: Contact guard assist Gait Distance (Feet): 30 Feet Assistive device: Rolling walker (2 wheels) Gait Pattern/deviations: Step-to pattern Gait velocity: slow Gait velocity interpretation: <1.8 ft/sec, indicate of risk for recurrent falls      Stairs            Wheelchair Mobility     Tilt Bed    Modified Rankin (Stroke Patients Only)       Balance Overall balance assessment: Modified Independent Sitting-balance support: No upper extremity supported Sitting balance-Leahy Scale: Good     Standing balance support: Bilateral upper extremity supported Standing balance-Leahy Scale: Poor Standing balance comment: Pt relied on UE support for balance                             Pertinent Vitals/Pain Pain Assessment Pain Assessment: 0-10 Pain Score: 7  Pain Location: R knee Pain Descriptors / Indicators: Aching Pain Intervention(s): Monitored during session    Home Living Family/patient expects to be discharged to:: Private residence Living Arrangements: Children Available Help at Discharge: Family Type of Home: Mobile home Home Access: Stairs to enter Entrance Stairs-Rails: None Entrance Stairs-Number of Steps: 3   Home Layout: One level Home Equipment: Agricultural consultant (2 wheels)      Prior Function Prior Level of Function : Independent/Modified Independent  Extremity/Trunk Assessment   Upper Extremity Assessment Upper Extremity Assessment: Overall WFL for tasks assessed    Lower Extremity Assessment Lower Extremity Assessment: RLE deficits/detail RLE: Unable to fully assess due to pain RLE Sensation: WNL RLE Coordination: WNL    Cervical / Trunk Assessment Cervical / Trunk Assessment:  Normal  Communication   Communication Communication: No apparent difficulties Cueing Techniques: Verbal cues;Tactile cues;Visual cues  Cognition Arousal: Alert Behavior During Therapy: WFL for tasks assessed/performed Overall Cognitive Status: Within Functional Limits for tasks assessed                                          General Comments      Exercises     Assessment/Plan    PT Assessment Patient needs continued PT services  PT Problem List Decreased strength;Decreased range of motion;Decreased activity tolerance;Decreased balance;Decreased mobility       PT Treatment Interventions Gait training;Stair training;Functional mobility training;Therapeutic activities;Therapeutic exercise;Balance training    PT Goals (Current goals can be found in the Care Plan section)  Acute Rehab PT Goals Patient Stated Goal: to go home PT Goal Formulation: With patient Time For Goal Achievement: 09/20/23 Potential to Achieve Goals: Good    Frequency Min 1X/week     Co-evaluation               AM-PAC PT "6 Clicks" Mobility  Outcome Measure Help needed turning from your back to your side while in a flat bed without using bedrails?: None Help needed moving from lying on your back to sitting on the side of a flat bed without using bedrails?: A Little Help needed moving to and from a bed to a chair (including a wheelchair)?: A Little Help needed standing up from a chair using your arms (e.g., wheelchair or bedside chair)?: A Little Help needed to walk in hospital room?: A Little Help needed climbing 3-5 steps with a railing? : A Little 6 Click Score: 19    End of Session Equipment Utilized During Treatment: Gait belt Activity Tolerance: Patient limited by pain Patient left: in bed;with call bell/phone within reach Nurse Communication: Mobility status PT Visit Diagnosis: Other abnormalities of gait and mobility (R26.89);Muscle weakness (generalized)  (M62.81);Pain Pain - Right/Left: Right Pain - part of body: Knee    Time: 1250-1322 PT Time Calculation (min) (ACUTE ONLY): 32 min   Charges:   PT Evaluation $PT Eval Low Complexity: 1 Low   PT General Charges $$ ACUTE PT VISIT: 1 Visit       Caryl Comes, SPT Acute rehab [phone]  Caryl Comes 09/06/2023, 2:47 PM

## 2023-09-07 ENCOUNTER — Encounter (HOSPITAL_COMMUNITY): Payer: Self-pay | Admitting: Orthopaedic Surgery

## 2023-09-07 ENCOUNTER — Other Ambulatory Visit: Payer: Self-pay | Admitting: Physician Assistant

## 2023-09-07 DIAGNOSIS — Z79899 Other long term (current) drug therapy: Secondary | ICD-10-CM | POA: Diagnosis not present

## 2023-09-07 DIAGNOSIS — M1711 Unilateral primary osteoarthritis, right knee: Secondary | ICD-10-CM | POA: Diagnosis not present

## 2023-09-07 DIAGNOSIS — Z96651 Presence of right artificial knee joint: Secondary | ICD-10-CM | POA: Diagnosis not present

## 2023-09-07 DIAGNOSIS — F17210 Nicotine dependence, cigarettes, uncomplicated: Secondary | ICD-10-CM | POA: Diagnosis not present

## 2023-09-07 DIAGNOSIS — I10 Essential (primary) hypertension: Secondary | ICD-10-CM | POA: Diagnosis not present

## 2023-09-07 NOTE — Discharge Summary (Signed)
Patient ID: Samantha Evans MRN: [id number] DOB/AGE: 21-Oct-1961 62 y.o.  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 female who was admitted 09/06/2023 for operative treatment ofPrimary osteoarthritis of right knee. Patient has severe unremitting pain that affects sleep, daily activities, and work/hobbies. After pre-op clearance the patient was taken to the operating room on 09/06/2023 and underwent  Procedure(s): RIGHT TOTAL KNEE ARTHROPLASTY.    Patient was given perioperative antibiotics:  Anti-infectives (From admission, onward)    Start     Dose/Rate Route Frequency Ordered Stop   09/06/23 1330  ceFAZolin (ANCEF) IVPB 2g/100 mL premix        2 g 200 mL/hr over 30 Minutes Intravenous Every 6 hours 09/06/23 1031 09/06/23 2033   09/06/23 0809  vancomycin (VANCOCIN) powder  Status:  Discontinued          As needed 09/06/23 0811 09/06/23 0912   09/06/23 0600  ceFAZolin (ANCEF) IVPB 2g/100 mL premix        2 g 200 mL/hr over 30 Minutes Intravenous On call to O.R. 09/06/23 0542 09/06/23 0743        Patient was given sequential compression devices, early ambulation, and chemoprophylaxis to prevent DVT.  Patient benefited maximally from hospital stay and there were no complications.    Recent vital signs: Patient  Vitals for the past 24 hrs:  BP Temp Temp src Pulse Resp SpO2  09/07/23 0742 (!) 142/65 97.7 F (36.5 C) Oral 71 16 92 %  09/07/23 0211 (!) 145/80 98.3 F (36.8 C) Oral 73 20 91 %  09/06/23 2328 (!) 156/70 98.1 F (36.7 C) Oral 72 20 94 %  09/06/23 2004 (!) 168/73 98.1 F (36.7 C) Oral (!) 59 18 96 %  09/06/23 1514 (!) 158/77 98 F (36.7 C) Oral 63 20 100 %  09/06/23 1040 (!) 159/78 98 F (36.7 C) Oral 71 20 97 %  09/06/23 1025 -- -- -- -- -- 97 %  09/06/23 1015 137/67 (!) 97 F (36.1 C) -- 71 16 92 %  09/06/23 1000 135/71 -- -- 69 20 96 %  09/06/23 0950 (!) 138/56 -- -- 70 16 95 %  09/06/23 0935 128/66 -- -- 73 14 94 %  09/06/23 0920 (!) 150/74 (!) 97 F (36.1 C) -- 77 14 94 %     Recent laboratory studies: No results for input(s): "WBC", "HGB", "HCT", "PLT", "NA", "K", "CL", "CO2", "BUN", "CREATININE", "GLUCOSE", "INR", "CALCIUM" in the last 72 hours.  Invalid input(s): "PT", "2"   Discharge Medications:   Allergies as of 09/07/2023   No Known Allergies      Medication List     STOP taking these medications    ibuprofen 200 MG tablet Commonly known as: ADVIL       TAKE these medications  Abdominal Binder/Elastic XL Misc 1 Container by Does not apply route as needed.   amLODipine 5 MG tablet Commonly known as: NORVASC Take 1 tablet (5 mg total) by mouth daily.   aspirin EC 81 MG tablet Take 1 tablet (81 mg total) by mouth 2 (two) times daily. To be taken after surgery What changed:  when to take this reasons to take this additional instructions   docusate sodium 100 MG capsule Commonly known as: Colace Take 1 capsule (100 mg total) by mouth daily as needed.   Magnesium 250 MG Tabs Take 500 mg by mouth daily.   methocarbamol 750 MG tablet Commonly known as: Robaxin-750 Take 1 tablet (750 mg total) by mouth 2 (two) times daily as needed for muscle spasms.   ondansetron 4 MG tablet Commonly known as: Zofran Take 1 tablet (4 mg total) by mouth  every 8 (eight) hours as needed for nausea or vomiting.   oxyCODONE-acetaminophen 7.5-325 MG tablet Commonly known as: Percocet Take 1-2 tablets by mouth every 6 (six) hours as needed for severe pain (pain score 7-10). What changed:  how much to take when to take this additional instructions   spironolactone 50 MG tablet Commonly known as: ALDACTONE Take 50 mg by mouth daily.               Durable Medical Equipment  (From admission, onward)           Start     Ordered   09/06/23 1032  DME Walker rolling  Once       Question Answer Comment  Walker: With 5 Inch Wheels   Patient needs a walker to treat with the following condition Status post left partial knee replacement      09/06/23 1031   09/06/23 1032  DME 3 n 1  Once        09/06/23 1031   09/06/23 1032  DME Bedside commode  Once       Question:  Patient needs a bedside commode to treat with the following condition  Answer:  Status post left partial knee replacement   09/06/23 1031            Diagnostic Studies: DG Knee Right Port  Result Date: 09/06/2023 CLINICAL DATA:  Right knee pain. EXAM: PORTABLE RIGHT KNEE - 1-2 VIEW COMPARISON:  Preoperative radiographs. FINDINGS: Right knee arthroplasty in expected alignment. No periprosthetic lucency or fracture. Recent postsurgical change includes air and edema in the soft tissues and joint space. Healed proximal fibular fracture unchanged in appearance. IMPRESSION: Right knee arthroplasty without immediate postoperative complication. Electronically Signed   By: Narda Rutherford M.D.   On: 09/06/2023 12:39    Disposition: Discharge disposition: 01-Home or Self Care          Follow-up Information     Cristie Hem, PA-C. Schedule an appointment as soon as possible for a visit in 2 week(s).   Specialty: Orthopedic Surgery Contact information: [address] [place] [address] [phone]                  Signed: Cristie Hem 09/07/2023, 7:50 AM

## 2023-09-07 NOTE — Progress Notes (Signed)
Physical Therapy Treatment Patient Details Name: Samantha Evans MRN: [id number] DOB: [birth year] Today's Date: 09/07/2023   History of Present Illness 62 y.o female presents to [facility] hospital on 09/06/2023 for elective R TKA. PMH includes hepatic cirrhosis, AKI, peripheral edema, liver lesion.    PT Comments  Pt tolerates treatment well, ambulating for increased distances. PT provides education on stair negotiation sequencing. Pt is able to negotiate stairs with unilateral UE support, PT encourages use of cane to negotiate steps at home due to lack of a railing. Pt is encouraged to mobilize frequently at the time of discharge. PT reinforces the need for knee extension when resting to allow for increased knee extension ROM. PT will continue to follow until discharge is complete.    If plan is discharge home, recommend the following: A little help with bathing/dressing/bathroom;Assistance with cooking/housework;Assist for transportation;Help with stairs or ramp for entrance   Can travel by private vehicle        Equipment Recommendations  None recommended by PT (pt reports owning a RW)    Recommendations for Other Services       Precautions / Restrictions Precautions Precautions: Knee Precaution Booklet Issued: Yes (comment) Restrictions Weight Bearing Restrictions: Yes RLE Weight Bearing: Weight bearing as tolerated     Mobility  Bed Mobility                    Transfers Overall transfer level: Needs assistance Equipment used: Rolling walker (2 wheels) Transfers: Sit to/from Stand Sit to Stand: Supervision                Ambulation/Gait Ambulation/Gait assistance: Contact guard assist Gait Distance (Feet): 100 Feet Assistive device: Rolling walker (2 wheels) Gait Pattern/deviations: Step-through pattern Gait velocity: reduced Gait velocity interpretation: <1.8 ft/sec, indicate of risk for recurrent falls   General Gait Details: slowed step-through gait, pt  lacking terminal knee extension of RLE during gait cycle. PT encourages pt maintain RW closer to body to reduce stride length due to lack of terminal knee extension, in an effort to reduce the risk for potential R knee buckling   Stairs Stairs: Yes Stairs assistance: Min assist Stair Management: No rails, Step to pattern, Forwards (with hand hold to simulate cane) Number of Stairs: 3 General stair comments: verbal cues for step sequencing. PT encourages use of cane to negotiate stairs, along with hand hold from caregiver   Wheelchair Mobility     Tilt Bed    Modified Rankin (Stroke Patients Only)       Balance Overall balance assessment: Needs assistance Sitting-balance support: No upper extremity supported, Feet supported Sitting balance-Leahy Scale: Good     Standing balance support: Single extremity supported, Reliant on assistive device for balance Standing balance-Leahy Scale: Poor                              Cognition Arousal: Alert Behavior During Therapy: WFL for tasks assessed/performed Overall Cognitive Status: Within Functional Limits for tasks assessed                                          Exercises Total Joint Exercises Goniometric ROM: -8 degrees knee extension (taken in sitting, likely capable of increased extension in supine), 80 degrees knee flexion in sitting Other Exercises Other Exercises: pt denies questions or concerns about TKR exercise  packet    General Comments General comments (skin integrity, edema, etc.): VSS on RA      Pertinent Vitals/Pain Pain Assessment Pain Assessment: Faces Faces Pain Scale: Hurts little more Pain Location: R knee Pain Descriptors / Indicators: Aching Pain Intervention(s): Monitored during session    Home Living Family/patient expects to be discharged to:: Private residence Living Arrangements: Children Available Help at Discharge: Family Type of Home: Mobile home Home  Access: Stairs to enter Entrance Stairs-Rails: None Entrance Stairs-Number of Steps: 3   Home Layout: One level Home Equipment: Agricultural consultant (2 wheels);Shower seat;BSC/3in1      Prior Function            PT Goals (current goals can now be found in the care plan section) Acute Rehab PT Goals Patient Stated Goal: to go home Progress towards PT goals: Progressing toward goals    Frequency    Min 1X/week      PT Plan      Co-evaluation              AM-PAC PT "6 Clicks" Mobility   Outcome Measure  Help needed turning from your back to your side while in a flat bed without using bedrails?: None Help needed moving from lying on your back to sitting on the side of a flat bed without using bedrails?: A Little Help needed moving to and from a bed to a chair (including a wheelchair)?: A Little Help needed standing up from a chair using your arms (e.g., wheelchair or bedside chair)?: A Little Help needed to walk in hospital room?: A Little Help needed climbing 3-5 steps with a railing? : A Little 6 Click Score: 19    End of Session Equipment Utilized During Treatment: Gait belt Activity Tolerance: Patient tolerated treatment well Patient left: in bed;with call bell/phone within reach Nurse Communication: Mobility status PT Visit Diagnosis: Other abnormalities of gait and mobility (R26.89);Muscle weakness (generalized) (M62.81);Pain Pain - Right/Left: Right Pain - part of body: Knee     Time: 9562-1308 PT Time Calculation (min) (ACUTE ONLY): 12 min  Charges:    $Gait Training: 8-22 mins PT General Charges $$ ACUTE PT VISIT: 1 Visit                     Arlyss Gandy, PT, DPT Acute Rehabilitation Office [phone]    Arlyss Gandy 09/07/2023, 9:26 AM

## 2023-09-07 NOTE — Progress Notes (Signed)
Subjective: 1 Day Post-Op Procedure(s) (LRB): RIGHT TOTAL KNEE ARTHROPLASTY (Right) Patient reports pain as mild.    Objective: Vital signs in last 24 hours: Temp:  [97 F (36.1 C)-98.3 F (36.8 C)] 97.7 F (36.5 C) (10/22 0742) Pulse Rate:  [59-77] 71 (10/22 0742) Resp:  [14-20] 16 (10/22 0742) BP: (128-168)/(56-80) 142/65 (10/22 0742) SpO2:  [91 %-100 %] 92 % (10/22 0742)  Intake/Output from previous day: 10/21 0701 - 10/22 0700 In: 1120 [P.O.:720; I.V.:400] Out: 10 [Blood:10] Intake/Output this shift: No intake/output data recorded.  No results for input(s): "HGB" in the last 72 hours. No results for input(s): "WBC", "RBC", "HCT", "PLT" in the last 72 hours. No results for input(s): "NA", "K", "CL", "CO2", "BUN", "CREATININE", "GLUCOSE", "CALCIUM" in the last 72 hours. No results for input(s): "LABPT", "INR" in the last 72 hours.  Neurologically intact Neurovascular intact Sensation intact distally Intact pulses distally Dorsiflexion/Plantar flexion intact Incision: scant drainage No cellulitis present Compartment soft   Assessment/Plan: 1 Day Post-Op Procedure(s) (LRB): RIGHT TOTAL KNEE ARTHROPLASTY (Right) Advance diet Up with therapy D/C IV fluids Discharge home with home health once cleared by PT WBAT RLE      Cristie Hem 09/07/2023, 7:48 AM

## 2023-09-07 NOTE — Progress Notes (Signed)
Patient alert and oriented, void, ambulate. Surgical site clean and dry no sign of infection. D/c instructions explain and given to the patient. Patient d/c per order.

## 2023-09-07 NOTE — Evaluation (Signed)
Occupational Therapy Evaluation Patient Details Name: Samantha Evans MRN: [id number] DOB: 05/26/1961 Today's Date: 09/07/2023   History of Present Illness 62 y.o female presents to [facility] hospital on 09/06/2023 for elective R TKA. PMH includes hepatic cirrhosis, AKI, peripheral edema, liver lesion.   Clinical Impression   Pt reports ind at baseline with ADLs/functional mobility, lives with son who can assist at d/c. Son assists with some IADLs already. Pt currently needing set up - min A for ADLs, and CGA for transfers with RW. Pt educated on compensatory strategies for LB ADLs and use of shower seat for home. Reiterated not placing pillow under knee as well and pt verbalized understanding. Pt presenting with impairments listed below, will follow acutely. Follow physician's recommendations for follow up therapies.       If plan is discharge home, recommend the following: A little help with walking and/or transfers;A little help with bathing/dressing/bathroom;Assistance with cooking/housework;Assist for transportation;Help with stairs or ramp for entrance    Functional Status Assessment  Patient has had a recent decline in their functional status and demonstrates the ability to make significant improvements in function in a reasonable and predictable amount of time.  Equipment Recommendations  None recommended by OT (pt has 3in1, RW and shower seat)    Recommendations for Other Services PT consult     Precautions / Restrictions Precautions Precautions: Knee Restrictions Weight Bearing Restrictions: Yes RLE Weight Bearing: Weight bearing as tolerated      Mobility Bed Mobility               General bed mobility comments: seated EOB upon arrival and departure    Transfers Overall transfer level: Needs assistance Equipment used: Rolling walker (2 wheels) Transfers: Sit to/from Stand Sit to Stand: Contact guard assist                  Balance Overall balance  assessment: Needs assistance Sitting-balance support: No upper extremity supported Sitting balance-Leahy Scale: Good     Standing balance support: Bilateral upper extremity supported Standing balance-Leahy Scale: Poor Standing balance comment: Pt relied on UE support for balance                           ADL either performed or assessed with clinical judgement   ADL Overall ADL's : Needs assistance/impaired Eating/Feeding: Set up;Sitting   Grooming: Contact guard assist;Standing   Upper Body Bathing: Minimal assistance;Sitting   Lower Body Bathing: Minimal assistance;Sitting/lateral leans   Upper Body Dressing : Minimal assistance;Sitting   Lower Body Dressing: Minimal assistance;Sitting/lateral leans   Toilet Transfer: Contact guard assist;Ambulation;Regular Toilet;Rolling walker (2 wheels)   Toileting- Clothing Manipulation and Hygiene: Supervision/safety       Functional mobility during ADLs: Contact guard assist;Rolling walker (2 wheels)       Vision   Vision Assessment?: No apparent visual deficits     Perception Perception: Not tested       Praxis Praxis: Not tested       Pertinent Vitals/Pain Pain Assessment Pain Assessment: Faces Pain Score: 3  Faces Pain Scale: Hurts little more Pain Location: R knee Pain Descriptors / Indicators: Aching Pain Intervention(s): Limited activity within patient's tolerance, Monitored during session, Repositioned     Extremity/Trunk Assessment Upper Extremity Assessment Upper Extremity Assessment: Overall WFL for tasks assessed   Lower Extremity Assessment Lower Extremity Assessment: Defer to PT evaluation   Cervical / Trunk Assessment Cervical / Trunk Assessment: Normal   Communication Communication  Communication: No apparent difficulties   Cognition Arousal: Alert Behavior During Therapy: WFL for tasks assessed/performed Overall Cognitive Status: Within Functional Limits for tasks assessed                                  General Comments: slow processing and some wordfinding difficulties, pt reports poor sleep last night     General Comments  VSS    Exercises     Shoulder Instructions      Home Living Family/patient expects to be discharged to:: Private residence Living Arrangements: Children Available Help at Discharge: Family Type of Home: Mobile home Home Access: Stairs to enter Entrance Stairs-Number of Steps: 3 Entrance Stairs-Rails: None Home Layout: One level     Bathroom Shower/Tub: Producer, television/film/video: Standard Bathroom Accessibility: Yes   Home Equipment: Agricultural consultant (2 wheels);Shower seat;BSC/3in1          Prior Functioning/Environment Prior Level of Function : Independent/Modified Independent                        OT Problem List: Decreased strength;Decreased range of motion;Decreased activity tolerance;Impaired balance (sitting and/or standing)      OT Treatment/Interventions: Self-care/ADL training;Therapeutic exercise;Energy conservation;DME and/or AE instruction;Therapeutic activities;Patient/family education;Balance training    OT Goals(Current goals can be found in the care plan section) Acute Rehab OT Goals Patient Stated Goal: none stated OT Goal Formulation: With patient Time For Goal Achievement: 09/21/23 Potential to Achieve Goals: Good  OT Frequency: Min 1X/week    Co-evaluation              AM-PAC OT "6 Clicks" Daily Activity     Outcome Measure Help from another person eating meals?: None Help from another person taking care of personal grooming?: A Little Help from another person toileting, which includes using toliet, bedpan, or urinal?: A Little Help from another person bathing (including washing, rinsing, drying)?: A Little Help from another person to put on and taking off regular upper body clothing?: A Little Help from another person to put on and taking off regular lower  body clothing?: A Little 6 Click Score: 19   End of Session Equipment Utilized During Treatment: Rolling walker (2 wheels) Nurse Communication: Mobility status  Activity Tolerance: Patient tolerated treatment well Patient left: in bed;with call bell/phone within reach  OT Visit Diagnosis: Unsteadiness on feet (R26.81);Other abnormalities of gait and mobility (R26.89);Muscle weakness (generalized) (M62.81)                Time: 1610-9604 OT Time Calculation (min): 15 min Charges:  OT General Charges $OT Visit: 1 Visit OT Evaluation $OT Eval Low Complexity: 1 Low  Kyren Vaux K, OTD, OTR/L SecureChat Preferred Acute Rehab [phone]   Carver Fila Koonce 09/07/2023, 8:13 AM

## 2023-09-14 ENCOUNTER — Ambulatory Visit: Payer: Medicaid Other | Admitting: Physician Assistant

## 2023-09-14 ENCOUNTER — Other Ambulatory Visit (INDEPENDENT_AMBULATORY_CARE_PROVIDER_SITE_OTHER): Payer: Self-pay

## 2023-09-14 ENCOUNTER — Encounter: Payer: Self-pay | Admitting: Physician Assistant

## 2023-09-14 DIAGNOSIS — Z96651 Presence of right artificial knee joint: Secondary | ICD-10-CM | POA: Diagnosis not present

## 2023-09-14 NOTE — Progress Notes (Signed)
Office Visit Note   Patient: Samantha Evans           Date of Birth: Feb 15, 1961           MRN: [id number] Visit Date: 09/14/2023              Requested by: Samantha Evans [address] [place] [address] [place],  [place] [address] PCP: Samantha Evans  Chief Complaint  Patient presents with   Right Knee - Injury    09/06/23 RT TKA with Samantha Evans      HPI: Samantha Evans is 8 days status post right total knee arthroplasty with [facility]anthaXu.  She presents today status post 3 falls at home.  She said she is has a fall 3 falls while she has been trying to balance herself getting out of bed to her walker.  She denies any hip pain she does not denies any loss of consciousness it just aggravates her knee pain.  She is somewhat unsure of what exercises to do though she is trying.  She has a CPM machine and has been taught how to set it up but has not been successful in using this consistently  Assessment & Plan: Visit Diagnoses:  1. S/P total knee arthroplasty, right     Plan: X-rays today do not show any fracture she does have quite a bit of bruising in her leg but she has absolutely no hip or back pain and good motion of her hip.  All her pain is focused to her knee.  Will see if we can get 1 home health visit to evaluate for home safety given her number of falls.  This would be to prevent further falling.  Also have instructed her to use her CPM machine will follow-up with Samantha Evans next week  Follow-Up Instructions: No follow-ups on file.   Ortho Exam  Patient is alert, oriented, no adenopathy, well-dressed, normal affect, normal respiratory effort. Examination of her right knee dressing was removed incision has well opposed wound edges no evidence of dehiscence no evidence of anything but just a small amount of bloody drainage.  Compartments are soft and compressible negative Samantha Evans she does have bruising from her hip down to her ankle but no real tenderness except for around her knee she is  neurovascularly intact  Imaging: XR Knee 1-2 Views Right  Result Date: 09/14/2023 Radiographs of the right knee were taken today status post right total knee arthroplasty components are in good position without evident evidence of fracture  No images are attached to the encounter.  Labs: Lab Results  Component Value Date   HGBA1C 4.8 05/26/2022     Lab Results  Component Value Date   ALBUMIN 3.7 08/25/2023   ALBUMIN 4.1 02/25/2023   ALBUMIN 4.1 09/28/2022    Lab Results  Component Value Date   MG 1.6 04/23/2023   Lab Results  Component Value Date   VD25OH 24 (L) 04/23/2023    No results found for: "PREALBUMIN"    Latest Ref Rng & Units 08/25/2023    2:45 PM 02/25/2023    2:58 PM 06/07/2022    9:25 PM  CBC EXTENDED  WBC 4.0 - 10.5 K/uL 4.7  4.5  5.5   RBC 3.87 - 5.11 MIL/uL 4.50  4.45  4.20   Hemoglobin 12.0 - 15.0 g/dL 19.1  47.8  29.5   HCT 36.0 - 46.0 % 40.8  40.0  36.5   Platelets 150 - 400 K/uL 105  109.0  119   NEUT# 1.4 - 7.7 K/uL  2.9    Lymph# 0.7 - 4.0 K/uL  1.0       There is no height or weight on file to calculate BMI.  Orders:  Orders Placed This Encounter  Procedures   XR Knee 1-2 Views Right   Ambulatory referral to Physical Therapy   No orders of the defined types were placed in this encounter.    Procedures: No procedures performed  Clinical Data: No additional findings.  ROS:  All other systems negative, except as noted in the HPI. Review of Systems  Objective: Vital Signs: There were no vitals taken for this visit.  Specialty Comments:  No specialty comments available.  PMFS History: Patient Active Problem List   Diagnosis Date Noted   Status post total right knee replacement 09/06/2023   Liver lesion 07/06/2023   Urge incontinence of urine 05/18/2023   Pancreatic cyst 11/26/2022   Abnormal magnetic resonance cholangiopancreatography (MRCP) 08/27/2022   Papilloma of right breast 07/16/2022   Ventral hernia,  recurrent 07/16/2022   Primary osteoarthritis of right knee 06/04/2022   Peripheral edema 05/12/2021   Chronic hepatitis C without hepatic coma (HCC) 03/25/2021   Acid reflux 03/25/2021   Hepatic cirrhosis (HCC)- by CT scan 02/27/2021   AKI (acute kidney injury) (HCC) 02/27/2021   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 updated 10/26/22    Family History  Problem Relation Age of Onset   Other Mother        Hit by a train while in her truck   Heart disease Father    Lung cancer Father    Liver disease Neg Hx    Colon cancer Neg Hx    Esophageal cancer Neg Hx    Pancreatic cancer Neg Hx    Stomach cancer Neg Hx    Colon polyps Neg Hx    Crohn's disease Neg Hx    Rectal cancer Neg Hx    Ulcerative colitis Neg Hx     Past Surgical History:  Procedure Laterality Date   BREAST BIOPSY WITH RADIO FREQUENCY LOCALIZER Right 08/12/2022   Procedure: BREAST BIOPSY- Office manager;  Surgeon: Samantha Lerner, Evans;  Location: [facility] ORS;  Service: General;  Laterality: Right;   CATARACT EXTRACTION Bilateral    CHOLECYSTECTOMY     COLONOSCOPY     ERCP     POLYPECTOMY     TOTAL KNEE ARTHROPLASTY Right 09/06/2023   Procedure: RIGHT TOTAL KNEE ARTHROPLASTY;  Surgeon: Samantha Kos, Evans;  Location: [facility] OR;  Service: Orthopedics;  Laterality: Right;   UMBILICAL HERNIA REPAIR N/A 02/27/2021   Procedure: REPAIR INCARCERATED UMBILICAL HERNIA;  Surgeon: Samantha Gelinas, Evans;  Location: [facility];  Service: General;  Laterality: N/A;   Social History   Occupational History   Occupation: unemployed  Tobacco Use   Smoking status: Every Day    Current packs/day: 0.50    Average packs/day: 0.5 packs/day for 40.0 years (20.0 ttl pk-yrs)     Types: Cigarettes    Passive exposure: Past   Smokeless tobacco: Never   Tobacco comments:    Quit smoking on 02/27/2021 when hernia repair surgery occurred.  Back  to smoking .5 pack of cigarettes daily  Vaping Use   Vaping status: Never Used  Substance and Sexual Activity   Alcohol use: Not Currently   Drug use: Not Currently    Comment: quit using crack cocaine 20 years ago updated 10/26/22   Sexual activity: Not on file

## 2023-09-17 DIAGNOSIS — Z419 Encounter for procedure for purposes other than remedying health state, unspecified: Secondary | ICD-10-CM | POA: Diagnosis not present

## 2023-09-21 ENCOUNTER — Encounter: Payer: Medicaid Other | Admitting: Physician Assistant

## 2023-09-24 ENCOUNTER — Encounter: Payer: Medicaid Other | Admitting: Physician Assistant

## 2023-09-24 DIAGNOSIS — Z7689 Persons encountering health services in other specified circumstances: Secondary | ICD-10-CM | POA: Diagnosis not present

## 2023-10-06 ENCOUNTER — Ambulatory Visit (INDEPENDENT_AMBULATORY_CARE_PROVIDER_SITE_OTHER): Payer: Medicaid Other | Admitting: Orthopaedic Surgery

## 2023-10-06 ENCOUNTER — Encounter: Payer: Self-pay | Admitting: Orthopaedic Surgery

## 2023-10-06 ENCOUNTER — Other Ambulatory Visit (INDEPENDENT_AMBULATORY_CARE_PROVIDER_SITE_OTHER): Payer: Medicaid Other

## 2023-10-06 DIAGNOSIS — Z96651 Presence of right artificial knee joint: Secondary | ICD-10-CM | POA: Diagnosis not present

## 2023-10-06 DIAGNOSIS — Z7689 Persons encountering health services in other specified circumstances: Secondary | ICD-10-CM | POA: Diagnosis not present

## 2023-10-06 NOTE — Progress Notes (Signed)
Post-Op Visit Note   Patient: Samantha Evans           Date of Birth: 1961-11-06           MRN: [id number] Visit Date: 10/06/2023 PCP: Hoy Register, MD   Assessment & Plan:  Chief Complaint:  Chief Complaint  Patient presents with   Right Knee - Follow-up    Right total knee arthroplasty 09/06/2023   Visit Diagnoses:  1. Status post total right knee replacement     Plan: Brynlei is 4 weeks status post right total knee replacement.  She has not had any physical therapy.  She has canceled I rescheduled her postop appointment multiple times.  She says she has not been using the CPM because she does not have any help to get into the machine.  She says her son works out of town.  Exam of the right knee shows healed surgical scar.  No signs of infection.  Range of motion is progressing well.  Quad function is still weak.  Collaterals are stable.  She has moderate ecchymosis and swelling from recent fall.  I think Milady is very fortunate that she has not gotten stiff despite not getting any physical therapy and the fact that she has been noncompliant with the CPM at home exercises.  We remove the sutures today.  We placed an order for outpatient PT.  I stressed the importance of being compliant with postoperative rehab protocol.  Recheck in 6 weeks.  Follow-Up Instructions: Return in about 6 weeks (around 11/17/2023).   Orders:  Orders Placed This Encounter  Procedures   XR Knee 1-2 Views Right   Ambulatory referral to Physical Therapy   No orders of the defined types were placed in this encounter.   Imaging: XR Knee 1-2 Views Right  Result Date: 10/06/2023 X-rays of the right knee demonstrate a stable right total knee replacement in good alignment    PMFS History: Patient Active Problem List   Diagnosis Date Noted   Status post total right knee replacement 09/06/2023   Liver lesion 07/06/2023   Urge incontinence of urine 05/18/2023   Pancreatic cyst 11/26/2022    Abnormal magnetic resonance cholangiopancreatography (MRCP) 08/27/2022   Papilloma of right breast 07/16/2022   Ventral hernia, recurrent 07/16/2022   Primary osteoarthritis of right knee 06/04/2022   Peripheral edema 05/12/2021   Chronic hepatitis C without hepatic coma (HCC) 03/25/2021   Acid reflux 03/25/2021   Hepatic cirrhosis (HCC)- by CT scan 02/27/2021   AKI (acute kidney injury) (HCC) 02/27/2021   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 updated 10/26/22    Family History  Problem Relation Age of Onset   Other Mother        Hit by a train while in her truck   Heart disease Father    Lung cancer Father    Liver disease Neg Hx    Colon cancer Neg Hx    Esophageal cancer Neg Hx    Pancreatic cancer Neg Hx    Stomach cancer Neg Hx    Colon polyps Neg Hx    Crohn's disease Neg Hx    Rectal cancer Neg Hx  Ulcerative colitis Neg Hx     Past Surgical History:  Procedure Laterality Date   BREAST BIOPSY WITH RADIO FREQUENCY LOCALIZER Right 08/12/2022   Procedure: BREAST BIOPSY- Office manager;  Surgeon: Campbell Lerner, MD;  Location: [facility] ORS;  Service: General;  Laterality: Right;   CATARACT EXTRACTION Bilateral    CHOLECYSTECTOMY     COLONOSCOPY     ERCP     POLYPECTOMY     TOTAL KNEE ARTHROPLASTY Right 09/06/2023   Procedure: RIGHT TOTAL KNEE ARTHROPLASTY;  Surgeon: Tarry Kos, MD;  Location: [facility] OR;  Service: Orthopedics;  Laterality: Right;   UMBILICAL HERNIA REPAIR N/A 02/27/2021   Procedure: REPAIR INCARCERATED UMBILICAL HERNIA;  Surgeon: Violeta Gelinas, MD;  Location: [facility] OR;  Service: General;  Laterality: N/A;   Social History   Occupational History   Occupation: unemployed  Tobacco Use    Smoking status: Every Day    Current packs/day: 0.50    Average packs/day: 0.5 packs/day for 40.0 years (20.0 ttl pk-yrs)    Types: Cigarettes    Passive exposure: Past   Smokeless tobacco: Never   Tobacco comments:    Quit smoking on 02/27/2021 when hernia repair surgery occurred.  Back to smoking .5 pack of cigarettes daily  Vaping Use   Vaping status: Never Used  Substance and Sexual Activity   Alcohol use: Not Currently   Drug use: Not Currently    Comment: quit using crack cocaine 20 years ago updated 10/26/22   Sexual activity: Not on file

## 2023-10-12 ENCOUNTER — Ambulatory Visit: Payer: Medicaid Other | Admitting: Physical Therapy

## 2023-10-17 ENCOUNTER — Other Ambulatory Visit: Payer: Self-pay | Admitting: Physician Assistant

## 2023-10-17 DIAGNOSIS — Z419 Encounter for procedure for purposes other than remedying health state, unspecified: Secondary | ICD-10-CM | POA: Diagnosis not present

## 2023-10-20 NOTE — Therapy (Signed)
OUTPATIENT PHYSICAL THERAPY EVALUATION   Patient Name: Samantha Evans MRN: [id number] DOB:1961/04/19, 62 y.o., female Today's Date: 10/21/2023   END OF SESSION:  PT End of Session - 10/21/23 1528     Visit Number 1    Number of Visits 17    Date for PT Re-Evaluation 12/16/23    Authorization Type [facility] [employer]    Authorization Time Period auth tbd    PT Start Time 1530    PT Stop Time 1612    PT Time Calculation (min) 42 min    Activity Tolerance Patient tolerated treatment well;No increased pain    Behavior During Therapy WFL for tasks assessed/performed             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 updated 10/26/22   Past Surgical History:  Procedure Laterality Date   BREAST BIOPSY WITH RADIO FREQUENCY LOCALIZER Right 08/12/2022   Procedure: BREAST BIOPSY- Office manager;  Surgeon: Campbell Lerner, MD;  Location: [facility] ORS;  Service: General;  Laterality: Right;   CATARACT EXTRACTION Bilateral    CHOLECYSTECTOMY     COLONOSCOPY     ERCP     POLYPECTOMY     TOTAL KNEE ARTHROPLASTY Right 09/06/2023   Procedure: RIGHT TOTAL KNEE ARTHROPLASTY;  Surgeon: Tarry Kos, MD;  Location: [facility] OR;  Service: Orthopedics;  Laterality: Right;   UMBILICAL HERNIA REPAIR N/A 02/27/2021   Procedure: REPAIR INCARCERATED UMBILICAL HERNIA;  Surgeon: Violeta Gelinas, MD;  Location: [facility] OR;  Service: General;  Laterality: N/A;   Patient Active Problem List   Diagnosis Date Noted   Status post total right knee replacement 09/06/2023   Liver lesion 07/06/2023   Urge incontinence of urine 05/18/2023   Pancreatic cyst 11/26/2022   Abnormal magnetic resonance cholangiopancreatography (MRCP) 08/27/2022   Papilloma of right  breast 07/16/2022   Ventral hernia, recurrent 07/16/2022   Primary osteoarthritis of right knee 06/04/2022   Peripheral edema 05/12/2021   Chronic hepatitis C without hepatic coma (HCC) 03/25/2021   Acid reflux 03/25/2021   Hepatic cirrhosis (HCC)- by CT scan 02/27/2021   AKI (acute kidney injury) (HCC) 02/27/2021    PCP: Hoy Register, MD  REFERRING PROVIDER: Tarry Kos, MD  REFERRING DIAG: Status post total right knee replacement  THERAPY DIAG:  Right knee pain, unspecified chronicity  Muscle weakness (generalized)  Other abnormalities of gait and mobility  Rationale for Evaluation and Treatment: Rehabilitation  ONSET DATE: 09/06/2023 DOS    SUBJECTIVE:  SUBJECTIVE STATEMENT: Pain fairly consistent since surgery, using RW. Feels burning in anterior thigh which resolves w/ massage. Was icing initially but states her machine broke. States swelling has improved. Most difficulty with extending leg.  No fevers/night sweats, no chills, occasional N/T as described below.   PERTINENT HISTORY: Right TKA 09/06/2023, HTN, hep C  PAIN:  Are you having pain? Yes:  NPRS scale: 4/10, 7/10 at worst Pain location: Right knee, distal quad Pain description: burning Aggravating factors: getting out of bed in mornings, walking within household, avoiding stairs  Relieving factors: self massage  PRECAUTIONS: fall risk, chronic hep C  RED FLAGS: None   WEIGHT BEARING RESTRICTIONS: No  FALLS:  Has patient  fallen in last 6 months? Yes. Number of falls 2 falls, a couple months ago getting out of bed (pt states shortly after knee surgery, had imaging after per pt report)  LIVING ENVIRONMENT: Mobile home, lives w/ son who works out of town per pt report. Ramped entrance in back, 3 STE without rails Has a friend who lives with them as well, does a lot of the cooking and housework  Has shower chair, walk in shower   PLOF: independent w/o device  PATIENT GOALS: be able to walk  without device   OBJECTIVE:  Note: Objective measures were completed at Evaluation unless otherwise noted. PATIENT SURVEYS:  FOTO 44 > 61  COGNITION: Overall cognitive status: Within functional limits for tasks assessed     SENSATION: Occasional popliteal numbness per pt, light touch symmetrical  on exam   EDEMA/OBSERVATION:  Mild edema about knee joint, not formally measured. No swelling in calf, no overt erythema. Does still have some scabbing about the incision but no openings or drainage that are apparent on eval.    PALPATION: Tenderness lateral knee, distal quad . No calf tenderness  LOWER EXTREMITY ROM:  Active ROM Right eval Left eval  Knee flexion A: 122 deg with pain   Knee extension Lacking 12 actively in supine   Ankle dorsiflexion     (Blank rows = not tested) (Key: WFL = within functional limits not formally assessed, * = concordant pain, s = stiffness/stretching sensation, NT = not tested)  Comment: passively pt is able to achieve just shy of full extension, not formally measured  LOWER EXTREMITY MMT:    MMT Right eval Left eval  Hip flexion    Hip abduction (modified sitting)    Hip internal rotation    Hip external rotation    Knee flexion    Knee extension    Ankle dorsiflexion     (Blank rows = not tested) (Key: WFL = within functional limits not formally assessed, * = concordant pain, s = stiffness/stretching sensation, NT = not tested)  Comments: deferred given pain with mobility and apparent quad weakness  FUNCTIONAL TESTS:  5xSTS: 29.83sec with UE support, weight shift to L  GAIT: Distance walked: within clinic Assistive device utilized: Environmental consultant - 2 wheeled Level of assistance: Modified independence Comments: antalgic gait, leading with RLE, reduced gait speed/cadence   TODAY'S TREATMENT:   [facility] Adult PT Treatment:                                                DATE: 10/20/2023 Therapeutic Exercise: LAQ x10 Seated quad set w/ heel  propped x10 STS x5 HEP handout + education  PATIENT EDUCATION:  Education details: Pt education on PT impairments, prognosis, and POC. Informed consent. Rationale for interventions, safe/appropriate HEP performance Person educated: Patient Education method: Explanation, Demonstration, Tactile cues, Verbal cues, and Handouts Education comprehension: verbalized understanding, returned demonstration, verbal cues required, tactile cues required, and needs further education    HOME EXERCISE PROGRAM: Access Code: [facility] URL: [email or web address] Date: 10/21/2023 Prepared by: Fransisco Hertz  Exercises - Seated Quad Set  - 2-3 x daily - 1 sets - 10 reps - Sit to Stand with Armchair  - 2-3 x daily - 1 sets - 5 reps - Seated Long Arc Quad with Strap  - 2-3 x daily - 1 sets - 8 reps  ASSESSMENT: CLINICAL IMPRESSION: Patient is a pleasant 62 y.o. female who was seen today for physical therapy evaluation and treatment for right TKA on 09/06/2023. Pt states he has not had any HHPT or OPPT since surgery. States she has been using RW since surgery and is limited in household ambulation and typical daily tasks. On exam today pt demonstrates excellent knee flexion mobility although it is painful, most prominent deficit is significant quad weakness.  She also demonstrates altered kinematics with transfers and gait. Tolerates HEP well without increase in pain, no adverse events. Recommend skilled PT to address aforementioned deficits with aim of improving functional tolerance and reducing pain with typical activities. Pt departs today's session in no acute distress, all voiced concerns/questions addressed appropriately from PT perspective.     OBJECTIVE IMPAIRMENTS: Abnormal gait, decreased activity tolerance, decreased balance, decreased endurance, decreased mobility, difficulty walking, decreased ROM, decreased strength, improper body mechanics, and pain.   ACTIVITY LIMITATIONS: carrying,  lifting, bending, standing, squatting, stairs, transfers, bed mobility, and locomotion level  PARTICIPATION LIMITATIONS: meal prep, cleaning, laundry, driving, shopping, and community activity  PERSONAL FACTORS: Age, Time since onset of injury/illness/exacerbation, and 1-2 comorbidities: HTN, hep C  are also affecting patient's functional outcome.   REHAB POTENTIAL: Good   CLINICAL DECISION MAKING: Evolving/moderate complexity   EVALUATION COMPLEXITY: Moderate     GOALS: Goals reviewed with patient? Yes  SHORT TERM GOALS: Target date: 11/18/2023  Patient will be I with initial HEP in order to progress with therapy. Baseline: HEP provided at eval Goal status: INITIAL  2.  Patient will report right knee pain </= 5/10 at worst in order to reduce functional limitations Baseline: 4/10 avg, 7/10 at worst Goal status: INITIAL   LONG TERM GOALS: Target date: 12/16/2023 Patient will be I with final HEP to maintain progress from PT. Baseline: HEP provided at eval Goal status: INITIAL  2.  Patient will report >/= 61% status on FOTO to indicate improved functional ability. Baseline: 44% functional status Goal status: INITIAL  3.  Patient will demonstrate right knee AROM >/= 5-125 deg in order to improve her gait and mobility Baseline: 12-122 deg Goal status: INITIAL  4.  Patient will demonstrate right knee strength 5/5 MMT in order to improve her walking and activity tolerance Baseline: NT on eval Goal status: INITIAL  5. Pt will be able to perform 5xSTS in less than 20sec in order to facilitate reduced fall risk.  Baseline: 29sec w/ UE support  Goal status: INITIAL   PLAN: PT FREQUENCY: 1-2x/week   PT DURATION: 8 weeks    PLANNED INTERVENTIONS: [id number]- PT Re-evaluation, 97110-Therapeutic exercises, 97530- Therapeutic activity, 97112- Neuromuscular re-education, 97535- Self Care, [id number]- Manual therapy, [phone]- Gait training, [phone]- Vasopneumatic device, Patient/Family education,  Balance training, Stair training, Taping, Dry Needling, Joint mobilization, Joint manipulation, Scar mobilization, Cryotherapy, and Moist heat  PLAN FOR NEXT SESSION: Review HEP and progress PRN, emphasis on quad strength and extension mobility, functional kinematics.   Ashley Murrain PT, DPT 10/21/2023 5:23 PM    [employer] Authorization   Choose one: Rehabilitative  Standardized Assessment or Functional Outcome Tool: See Pain Assessment and 5x sit to stand  Score or Percent Disability: 29sec with UE support  Body Parts Treated (Select each separately):  Knee. Overall deficits/functional limitations for body part selected: moderate  If treatment provided at initial evaluation, no treatment charged due to lack of authorization.

## 2023-10-21 ENCOUNTER — Encounter: Payer: Self-pay | Admitting: Physical Therapy

## 2023-10-21 ENCOUNTER — Ambulatory Visit: Payer: Medicaid Other | Attending: Family Medicine | Admitting: Physical Therapy

## 2023-10-21 DIAGNOSIS — M25561 Pain in right knee: Secondary | ICD-10-CM | POA: Insufficient documentation

## 2023-10-21 DIAGNOSIS — M6281 Muscle weakness (generalized): Secondary | ICD-10-CM | POA: Insufficient documentation

## 2023-10-21 DIAGNOSIS — R2689 Other abnormalities of gait and mobility: Secondary | ICD-10-CM | POA: Insufficient documentation

## 2023-10-21 DIAGNOSIS — Z7689 Persons encountering health services in other specified circumstances: Secondary | ICD-10-CM | POA: Diagnosis not present

## 2023-10-26 ENCOUNTER — Telehealth: Payer: Self-pay | Admitting: Physical Therapy

## 2023-10-26 ENCOUNTER — Ambulatory Visit: Payer: Medicaid Other | Admitting: Physical Therapy

## 2023-10-26 NOTE — Telephone Encounter (Signed)
Called pt re: this afternoon's missed appt - left voicemail w/ office call back number for scheduling or any questions/concerns, advised on attendance policy, and confirmed date/time of next appt.

## 2023-10-26 NOTE — Therapy (Incomplete)
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 updated 10/26/22   Past Surgical History:  Procedure Laterality Date   BREAST BIOPSY WITH RADIO FREQUENCY LOCALIZER Right 08/12/2022   Procedure: BREAST BIOPSY- Office manager;  Surgeon: Campbell Lerner, MD;  Location: [facility] ORS;  Service: General;  Laterality: Right;   CATARACT EXTRACTION Bilateral    CHOLECYSTECTOMY     COLONOSCOPY     ERCP     POLYPECTOMY     TOTAL KNEE ARTHROPLASTY Right 09/06/2023   Procedure: RIGHT TOTAL KNEE ARTHROPLASTY;  Surgeon: Tarry Kos, MD;  Location: [facility] OR;  Service: Orthopedics;  Laterality: Right;   UMBILICAL HERNIA REPAIR N/A 02/27/2021   Procedure: REPAIR INCARCERATED UMBILICAL HERNIA;  Surgeon: Violeta Gelinas, MD;  Location: [facility] OR;  Service: General;  Laterality: N/A;   Patient Active Problem List   Diagnosis Date Noted   Status post total right knee replacement 09/06/2023   Liver lesion 07/06/2023   Urge incontinence of urine 05/18/2023   Pancreatic cyst 11/26/2022   Abnormal magnetic resonance cholangiopancreatography (MRCP) 08/27/2022   Papilloma of right breast 07/16/2022   Ventral hernia, recurrent 07/16/2022   Primary osteoarthritis of right knee 06/04/2022   Peripheral edema 05/12/2021   Chronic hepatitis C without hepatic coma (HCC) 03/25/2021   Acid reflux 03/25/2021   Hepatic cirrhosis (HCC)- by CT scan 02/27/2021   AKI (acute kidney injury) (HCC) 02/27/2021    PCP: Hoy Register, MD  REFERRING PROVIDER: Tarry Kos, MD  REFERRING DIAG: Status  post total right knee replacement  THERAPY DIAG:  No diagnosis found.  Rationale for Evaluation and Treatment: Rehabilitation  ONSET DATE: 09/06/2023 DOS    SUBJECTIVE:   Per eval - Pain fairly consistent since surgery, using RW. Feels burning in anterior thigh which resolves w/ massage. Was icing initially but states her machine broke. States swelling has improved. Most difficulty with extending leg.  No fevers/night sweats, no chills, occasional N/T as described below.   SUBJECTIVE STATEMENT: 10/26/2023 ***   PERTINENT HISTORY: Right TKA 09/06/2023, HTN, hep C  PAIN:  Are you having pain? Yes:  NPRS scale: 4/10, 7/10 at worst Pain location: Right knee, distal quad Pain description: burning Aggravating factors: getting out of bed in mornings, walking within household, avoiding stairs  Relieving factors: self massage  PRECAUTIONS: fall risk, chronic hep C  RED FLAGS: None   WEIGHT BEARING RESTRICTIONS: No  FALLS:  Has patient fallen in last 6 months? Yes. Number of falls 2 falls, a couple months ago getting out of bed (pt states shortly after knee surgery, had imaging after per pt report)  LIVING ENVIRONMENT: Mobile home, lives w/ son who works out of town per pt report. Ramped entrance in back, 3 STE without rails Has a friend who lives with them as well, does a lot of the cooking and housework  Has shower chair, walk in shower   PLOF: independent w/o device  PATIENT GOALS: be able to walk without device   OBJECTIVE:  Note: Objective measures were  completed at Evaluation unless otherwise noted. PATIENT SURVEYS:  FOTO 44 > 61  COGNITION: Overall cognitive status: Within functional limits for tasks assessed     SENSATION: Occasional popliteal numbness per pt, light touch symmetrical  on exam   EDEMA/OBSERVATION:  Mild edema about knee joint, not formally measured. No swelling in calf, no overt erythema. Does still have some scabbing about the incision but  no openings or drainage that are apparent on eval.    PALPATION: Tenderness lateral knee, distal quad . No calf tenderness  LOWER EXTREMITY ROM:  Active ROM Right eval Left eval  Knee flexion A: 122 deg with pain   Knee extension Lacking 12 actively in supine   Ankle dorsiflexion     (Blank rows = not tested) (Key: WFL = within functional limits not formally assessed, * = concordant pain, s = stiffness/stretching sensation, NT = not tested)  Comment: passively pt is able to achieve just shy of full extension, not formally measured  LOWER EXTREMITY MMT:    MMT Right eval Left eval  Hip flexion    Hip abduction (modified sitting)    Hip internal rotation    Hip external rotation    Knee flexion    Knee extension    Ankle dorsiflexion     (Blank rows = not tested) (Key: WFL = within functional limits not formally assessed, * = concordant pain, s = stiffness/stretching sensation, NT = not tested)  Comments: deferred given pain with mobility and apparent quad weakness  FUNCTIONAL TESTS:  5xSTS: 29.83sec with UE support, weight shift to L  GAIT: Distance walked: within clinic Assistive device utilized: Environmental consultant - 2 wheeled Level of assistance: Modified independence Comments: antalgic gait, leading with RLE, reduced gait speed/cadence   TODAY'S TREATMENT:   [facility] Adult PT Treatment:                                                DATE: 10/26/23 Therapeutic Exercise: *** Manual Therapy: *** Neuromuscular re-ed: *** Therapeutic Activity: *** Modalities: *** Self Care: ***    [facility] Adult PT Treatment:                                                DATE: 10/20/2023 Therapeutic Exercise: LAQ x10 Seated quad set w/ heel propped x10 STS x5 HEP handout + education  PATIENT EDUCATION:  Education details: rationale for interventions, HEP  Person educated: Patient Education method: Explanation, Demonstration, Tactile cues, Verbal cues Education comprehension:  verbalized understanding, returned demonstration, verbal cues required, tactile cues required, and needs further education     HOME EXERCISE PROGRAM: Access Code: [facility] URL: [email or web address] Date: 10/21/2023 Prepared by: Fransisco Hertz  Exercises - Seated Quad Set  - 2-3 x daily - 1 sets - 10 reps - Sit to Stand with Armchair  - 2-3 x daily - 1 sets - 5 reps - Seated Long Arc Quad with Strap  - 2-3 x daily - 1 sets - 8 reps   ASSESSMENT: CLINICAL IMPRESSION: 10/26/2023 ***  Per eval - Patient is a pleasant 62 y.o. female who was seen today for physical therapy evaluation and treatment for right TKA on 09/06/2023. Pt states he has not had any HHPT or OPPT  since surgery. States she has been using RW since surgery and is limited in household ambulation and typical daily tasks. On exam today pt demonstrates excellent knee flexion mobility although it is painful, most prominent deficit is significant quad weakness.  She also demonstrates altered kinematics with transfers and gait. Tolerates HEP well without increase in pain, no adverse events. Recommend skilled PT to address aforementioned deficits with aim of improving functional tolerance and reducing pain with typical activities. Pt departs today's session in no acute distress, all voiced concerns/questions addressed appropriately from PT perspective.     OBJECTIVE IMPAIRMENTS: Abnormal gait, decreased activity tolerance, decreased balance, decreased endurance, decreased mobility, difficulty walking, decreased ROM, decreased strength, improper body mechanics, and pain.   ACTIVITY LIMITATIONS: carrying, lifting, bending, standing, squatting, stairs, transfers, bed mobility, and locomotion level  PARTICIPATION LIMITATIONS: meal prep, cleaning, laundry, driving, shopping, and community activity  PERSONAL FACTORS: Age, Time since onset of injury/illness/exacerbation, and 1-2 comorbidities: HTN, hep C  are also affecting  patient's functional outcome.   REHAB POTENTIAL: Good   CLINICAL DECISION MAKING: Evolving/moderate complexity   EVALUATION COMPLEXITY: Moderate     GOALS: Goals reviewed with patient? Yes  SHORT TERM GOALS: Target date: 11/18/2023  Patient will be I with initial HEP in order to progress with therapy. Baseline: HEP provided at eval Goal status: INITIAL  2.  Patient will report right knee pain </= 5/10 at worst in order to reduce functional limitations Baseline: 4/10 avg, 7/10 at worst Goal status: INITIAL   LONG TERM GOALS: Target date: 12/16/2023 Patient will be I with final HEP to maintain progress from PT. Baseline: HEP provided at eval Goal status: INITIAL  2.  Patient will report >/= 61% status on FOTO to indicate improved functional ability. Baseline: 44% functional status Goal status: INITIAL  3.  Patient will demonstrate right knee AROM >/= 5-125 deg in order to improve her gait and mobility Baseline: 12-122 deg Goal status: INITIAL  4.  Patient will demonstrate right knee strength 5/5 MMT in order to improve her walking and activity tolerance Baseline: NT on eval Goal status: INITIAL  5. Pt will be able to perform 5xSTS in less than 20sec in order to facilitate reduced fall risk.  Baseline: 29sec w/ UE support  Goal status: INITIAL   PLAN: PT FREQUENCY: 1-2x/week   PT DURATION: 8 weeks    PLANNED INTERVENTIONS: 97164- PT Re-evaluation, 97110-Therapeutic exercises, 97530- Therapeutic activity, 97112- Neuromuscular re-education, 97535- Self Care, 16109- Manual therapy, [phone]- Gait training, [phone]- Vasopneumatic device, Patient/Family education, Balance training, Stair training, Taping, Dry Needling, Joint mobilization, Joint manipulation, Scar mobilization, Cryotherapy, and Moist heat  PLAN FOR NEXT SESSION: Review HEP and progress PRN, emphasis on quad strength and extension mobility, functional kinematics.   Ashley Murrain PT, DPT 10/26/2023 9:32 AM     [employer] Authorization   Choose one: Rehabilitative  Standardized Assessment or Functional Outcome Tool: See Pain Assessment and 5x sit to stand  Score or Percent Disability: 29sec with UE support  Body Parts Treated (Select each separately):  Knee. Overall deficits/functional limitations for body part selected: moderate  If treatment provided at initial evaluation, no treatment charged due to lack of authorization.

## 2023-10-27 NOTE — Therapy (Incomplete)
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 updated 10/26/22   Past Surgical History:  Procedure Laterality Date   BREAST BIOPSY WITH RADIO FREQUENCY LOCALIZER Right 08/12/2022   Procedure: BREAST BIOPSY- Office manager;  Surgeon: Campbell Lerner, MD;  Location: [facility] ORS;  Service: General;  Laterality: Right;   CATARACT EXTRACTION Bilateral    CHOLECYSTECTOMY     COLONOSCOPY     ERCP     POLYPECTOMY     TOTAL KNEE ARTHROPLASTY Right 09/06/2023   Procedure: RIGHT TOTAL KNEE ARTHROPLASTY;  Surgeon: Tarry Kos, MD;  Location: [facility] OR;  Service: Orthopedics;  Laterality: Right;   UMBILICAL HERNIA REPAIR N/A 02/27/2021   Procedure: REPAIR INCARCERATED UMBILICAL HERNIA;  Surgeon: Violeta Gelinas, MD;  Location: [facility] OR;  Service: General;  Laterality: N/A;   Patient Active Problem List   Diagnosis Date Noted   Status post total right knee replacement 09/06/2023   Liver lesion 07/06/2023   Urge incontinence of urine 05/18/2023   Pancreatic cyst 11/26/2022   Abnormal magnetic resonance cholangiopancreatography (MRCP) 08/27/2022   Papilloma of right breast 07/16/2022   Ventral hernia, recurrent 07/16/2022   Primary osteoarthritis of right knee 06/04/2022   Peripheral edema 05/12/2021   Chronic hepatitis C without hepatic coma (HCC) 03/25/2021   Acid reflux 03/25/2021   Hepatic cirrhosis (HCC)- by CT scan 02/27/2021   AKI (acute kidney injury) (HCC) 02/27/2021    PCP: Hoy Register, MD  REFERRING PROVIDER: Tarry Kos, MD  REFERRING DIAG: Status  post total right knee replacement  THERAPY DIAG:  No diagnosis found.  Rationale for Evaluation and Treatment: Rehabilitation  ONSET DATE: 09/06/2023 DOS    SUBJECTIVE:   Per eval - Pain fairly consistent since surgery, using RW. Feels burning in anterior thigh which resolves w/ massage. Was icing initially but states her machine broke. States swelling has improved. Most difficulty with extending leg.  No fevers/night sweats, no chills, occasional N/T as described below.   SUBJECTIVE STATEMENT: 10/27/2023 ***   PERTINENT HISTORY: Right TKA 09/06/2023, HTN, hep C  PAIN:  Are you having pain? Yes:  NPRS scale: 4/10, 7/10 at worst Pain location: Right knee, distal quad Pain description: burning Aggravating factors: getting out of bed in mornings, walking within household, avoiding stairs  Relieving factors: self massage  PRECAUTIONS: fall risk, chronic hep C  RED FLAGS: None   WEIGHT BEARING RESTRICTIONS: No  FALLS:  Has patient fallen in last 6 months? Yes. Number of falls 2 falls, a couple months ago getting out of bed (pt states shortly after knee surgery, had imaging after per pt report)  LIVING ENVIRONMENT: Mobile home, lives w/ son who works out of town per pt report. Ramped entrance in back, 3 STE without rails Has a friend who lives with them as well, does a lot of the cooking and housework  Has shower chair, walk in shower   PLOF: independent w/o device  PATIENT GOALS: be able to walk without device   OBJECTIVE:  Note: Objective measures were  completed at Evaluation unless otherwise noted. PATIENT SURVEYS:  FOTO 44 > 61  COGNITION: Overall cognitive status: Within functional limits for tasks assessed     SENSATION: Occasional popliteal numbness per pt, light touch symmetrical  on exam   EDEMA/OBSERVATION:  Mild edema about knee joint, not formally measured. No swelling in calf, no overt erythema. Does still have some scabbing about the incision but  no openings or drainage that are apparent on eval.    PALPATION: Tenderness lateral knee, distal quad . No calf tenderness  LOWER EXTREMITY ROM:  Active ROM Right eval Left eval  Knee flexion A: 122 deg with pain   Knee extension Lacking 12 actively in supine   Ankle dorsiflexion     (Blank rows = not tested) (Key: WFL = within functional limits not formally assessed, * = concordant pain, s = stiffness/stretching sensation, NT = not tested)  Comment: passively pt is able to achieve just shy of full extension, not formally measured  LOWER EXTREMITY MMT:    MMT Right eval Left eval  Hip flexion    Hip abduction (modified sitting)    Hip internal rotation    Hip external rotation    Knee flexion    Knee extension    Ankle dorsiflexion     (Blank rows = not tested) (Key: WFL = within functional limits not formally assessed, * = concordant pain, s = stiffness/stretching sensation, NT = not tested)  Comments: deferred given pain with mobility and apparent quad weakness  FUNCTIONAL TESTS:  5xSTS: 29.83sec with UE support, weight shift to L  GAIT: Distance walked: within clinic Assistive device utilized: Environmental consultant - 2 wheeled Level of assistance: Modified independence Comments: antalgic gait, leading with RLE, reduced gait speed/cadence   TODAY'S TREATMENT:   [facility] Adult PT Treatment:                                                DATE: 10/28/23 Therapeutic Exercise: *** Manual Therapy: *** Neuromuscular re-ed: *** Therapeutic Activity: *** Modalities: *** Self Care: ***    [facility] Adult PT Treatment:                                                DATE: 10/20/2023 Therapeutic Exercise: LAQ x10 Seated quad set w/ heel propped x10 STS x5 HEP handout + education  PATIENT EDUCATION:  Education details: rationale for interventions, HEP  Person educated: Patient Education method: Explanation, Demonstration, Tactile cues, Verbal cues Education comprehension:  verbalized understanding, returned demonstration, verbal cues required, tactile cues required, and needs further education     HOME EXERCISE PROGRAM: Access Code: [facility] URL: [email or web address] Date: 10/21/2023 Prepared by: Fransisco Hertz  Exercises - Seated Quad Set  - 2-3 x daily - 1 sets - 10 reps - Sit to Stand with Armchair  - 2-3 x daily - 1 sets - 5 reps - Seated Long Arc Quad with Strap  - 2-3 x daily - 1 sets - 8 reps   ASSESSMENT: CLINICAL IMPRESSION: 10/27/2023 ***  Per eval - Patient is a pleasant 62 y.o. female who was seen today for physical therapy evaluation and treatment for right TKA on 09/06/2023. Pt states he has not had any HHPT or OPPT  since surgery. States she has been using RW since surgery and is limited in household ambulation and typical daily tasks. On exam today pt demonstrates excellent knee flexion mobility although it is painful, most prominent deficit is significant quad weakness.  She also demonstrates altered kinematics with transfers and gait. Tolerates HEP well without increase in pain, no adverse events. Recommend skilled PT to address aforementioned deficits with aim of improving functional tolerance and reducing pain with typical activities. Pt departs today's session in no acute distress, all voiced concerns/questions addressed appropriately from PT perspective.     OBJECTIVE IMPAIRMENTS: Abnormal gait, decreased activity tolerance, decreased balance, decreased endurance, decreased mobility, difficulty walking, decreased ROM, decreased strength, improper body mechanics, and pain.   ACTIVITY LIMITATIONS: carrying, lifting, bending, standing, squatting, stairs, transfers, bed mobility, and locomotion level  PARTICIPATION LIMITATIONS: meal prep, cleaning, laundry, driving, shopping, and community activity  PERSONAL FACTORS: Age, Time since onset of injury/illness/exacerbation, and 1-2 comorbidities: HTN, hep C  are also affecting  patient's functional outcome.   REHAB POTENTIAL: Good   CLINICAL DECISION MAKING: Evolving/moderate complexity   EVALUATION COMPLEXITY: Moderate     GOALS: Goals reviewed with patient? Yes  SHORT TERM GOALS: Target date: 11/18/2023  Patient will be I with initial HEP in order to progress with therapy. Baseline: HEP provided at eval Goal status: INITIAL  2.  Patient will report right knee pain </= 5/10 at worst in order to reduce functional limitations Baseline: 4/10 avg, 7/10 at worst Goal status: INITIAL   LONG TERM GOALS: Target date: 12/16/2023 Patient will be I with final HEP to maintain progress from PT. Baseline: HEP provided at eval Goal status: INITIAL  2.  Patient will report >/= 61% status on FOTO to indicate improved functional ability. Baseline: 44% functional status Goal status: INITIAL  3.  Patient will demonstrate right knee AROM >/= 5-125 deg in order to improve her gait and mobility Baseline: 12-122 deg Goal status: INITIAL  4.  Patient will demonstrate right knee strength 5/5 MMT in order to improve her walking and activity tolerance Baseline: NT on eval Goal status: INITIAL  5. Pt will be able to perform 5xSTS in less than 20sec in order to facilitate reduced fall risk.  Baseline: 29sec w/ UE support  Goal status: INITIAL   PLAN: PT FREQUENCY: 1-2x/week   PT DURATION: 8 weeks    PLANNED INTERVENTIONS: 97164- PT Re-evaluation, 97110-Therapeutic exercises, 97530- Therapeutic activity, 97112- Neuromuscular re-education, 97535- Self Care, 09811- Manual therapy, [phone]- Gait training, [phone]- Vasopneumatic device, Patient/Family education, Balance training, Stair training, Taping, Dry Needling, Joint mobilization, Joint manipulation, Scar mobilization, Cryotherapy, and Moist heat  PLAN FOR NEXT SESSION: Review HEP and progress PRN, emphasis on quad strength and extension mobility, functional kinematics.  ***   [employer] Authorization   Choose one:  Rehabilitative  Standardized Assessment or Functional Outcome Tool: See Pain Assessment and 5x sit to stand  Score or Percent Disability: 29sec with UE support  Body Parts Treated (Select each separately):  Knee. Overall deficits/functional limitations for body part selected: moderate  If treatment provided at initial evaluation, no treatment charged due to lack of authorization.

## 2023-10-28 ENCOUNTER — Ambulatory Visit: Payer: Medicaid Other | Admitting: Physical Therapy

## 2023-11-01 ENCOUNTER — Ambulatory Visit: Payer: Medicaid Other | Admitting: Physical Therapy

## 2023-11-03 ENCOUNTER — Ambulatory Visit: Payer: Medicaid Other | Admitting: Physical Therapy

## 2023-11-04 ENCOUNTER — Encounter: Payer: Medicaid Other | Admitting: Physical Therapy

## 2023-11-08 NOTE — Telephone Encounter (Signed)
Only needs 6 weeks po

## 2023-11-12 ENCOUNTER — Encounter: Payer: Medicaid Other | Admitting: Physical Therapy

## 2023-11-15 ENCOUNTER — Encounter: Payer: Medicaid Other | Admitting: Physical Therapy

## 2023-11-16 ENCOUNTER — Encounter: Payer: Medicaid Other | Admitting: Physician Assistant

## 2023-11-17 DIAGNOSIS — Z419 Encounter for procedure for purposes other than remedying health state, unspecified: Secondary | ICD-10-CM | POA: Diagnosis not present

## 2023-11-18 ENCOUNTER — Encounter: Payer: Medicaid Other | Admitting: Physical Therapy

## 2023-12-18 DIAGNOSIS — Z419 Encounter for procedure for purposes other than remedying health state, unspecified: Secondary | ICD-10-CM | POA: Diagnosis not present

## 2024-01-03 ENCOUNTER — Ambulatory Visit: Payer: Self-pay | Admitting: Family Medicine

## 2024-01-10 ENCOUNTER — Ambulatory Visit: Payer: Medicaid Other | Admitting: Family Medicine

## 2024-01-15 DIAGNOSIS — Z419 Encounter for procedure for purposes other than remedying health state, unspecified: Secondary | ICD-10-CM | POA: Diagnosis not present

## 2024-02-26 DIAGNOSIS — Z419 Encounter for procedure for purposes other than remedying health state, unspecified: Secondary | ICD-10-CM | POA: Diagnosis not present

## 2024-03-27 DIAGNOSIS — Z419 Encounter for procedure for purposes other than remedying health state, unspecified: Secondary | ICD-10-CM | POA: Diagnosis not present

## 2024-04-27 DIAGNOSIS — Z419 Encounter for procedure for purposes other than remedying health state, unspecified: Secondary | ICD-10-CM | POA: Diagnosis not present

## 2024-05-11 ENCOUNTER — Telehealth: Payer: Self-pay

## 2024-05-11 NOTE — Telephone Encounter (Signed)
 Received paperwork from [employer] and reached out to patient to schedule visit and message states that call can not be completed at this time.

## 2024-05-27 DIAGNOSIS — Z419 Encounter for procedure for purposes other than remedying health state, unspecified: Secondary | ICD-10-CM | POA: Diagnosis not present

## 2024-06-27 DIAGNOSIS — Z419 Encounter for procedure for purposes other than remedying health state, unspecified: Secondary | ICD-10-CM | POA: Diagnosis not present

## 2024-07-28 DIAGNOSIS — Z419 Encounter for procedure for purposes other than remedying health state, unspecified: Secondary | ICD-10-CM | POA: Diagnosis not present

## 2024-08-27 DIAGNOSIS — Z419 Encounter for procedure for purposes other than remedying health state, unspecified: Secondary | ICD-10-CM | POA: Diagnosis not present

## 2024-09-18 ENCOUNTER — Encounter: Payer: Self-pay | Admitting: Radiology

## 2024-10-23 ENCOUNTER — Emergency Department (HOSPITAL_COMMUNITY)
Admission: EM | Admit: 2024-10-23 | Discharge: 2024-10-23 | Disposition: A | Discharge status: Home / Self Care | Attending: Emergency Medicine | Admitting: Emergency Medicine

## 2024-10-23 ENCOUNTER — Encounter (HOSPITAL_COMMUNITY): Payer: Self-pay

## 2024-10-23 ENCOUNTER — Emergency Department (HOSPITAL_COMMUNITY)

## 2024-10-23 ENCOUNTER — Other Ambulatory Visit: Payer: Self-pay

## 2024-10-23 DIAGNOSIS — K439 Ventral hernia without obstruction or gangrene: Secondary | ICD-10-CM

## 2024-10-23 DIAGNOSIS — K5730 Diverticulosis of large intestine without perforation or abscess without bleeding: Secondary | ICD-10-CM | POA: Diagnosis not present

## 2024-10-23 DIAGNOSIS — K7689 Other specified diseases of liver: Secondary | ICD-10-CM | POA: Diagnosis not present

## 2024-10-23 DIAGNOSIS — R1084 Generalized abdominal pain: Secondary | ICD-10-CM | POA: Diagnosis not present

## 2024-10-23 DIAGNOSIS — K469 Unspecified abdominal hernia without obstruction or gangrene: Secondary | ICD-10-CM | POA: Diagnosis not present

## 2024-10-23 DIAGNOSIS — K7460 Unspecified cirrhosis of liver: Secondary | ICD-10-CM | POA: Diagnosis not present

## 2024-10-23 DIAGNOSIS — Z743 Need for continuous supervision: Secondary | ICD-10-CM | POA: Diagnosis not present

## 2024-10-23 DIAGNOSIS — K429 Umbilical hernia without obstruction or gangrene: Secondary | ICD-10-CM | POA: Diagnosis not present

## 2024-10-23 DIAGNOSIS — R161 Splenomegaly, not elsewhere classified: Secondary | ICD-10-CM | POA: Diagnosis not present

## 2024-10-23 LAB — COMPREHENSIVE METABOLIC PANEL WITH GFR
ALT: 12 U/L (ref 0–44)
AST: 27 U/L (ref 15–41)
Albumin: 4.1 g/dL (ref 3.5–5.0)
Alkaline Phosphatase: 99 U/L (ref 38–126)
Anion gap: 8 (ref 5–15)
BUN: 12 mg/dL (ref 8–23)
CO2: 27 mmol/L (ref 22–32)
Calcium: 11.5 mg/dL — ABNORMAL HIGH (ref 8.9–10.3)
Chloride: 101 mmol/L (ref 98–111)
Creatinine, Ser: 0.66 mg/dL (ref 0.44–1.00)
GFR, Estimated: >60 mL/min (ref >60)
Glucose, Bld: 102 mg/dL — ABNORMAL HIGH (ref 70–99)
Potassium: 4.1 mmol/L (ref 3.5–5.1)
Sodium: 136 mmol/L (ref 135–145)
Total Bilirubin: 0.9 mg/dL (ref 0.0–1.2)
Total Protein: 7.6 g/dL (ref 6.5–8.1)

## 2024-10-23 LAB — URINALYSIS, ROUTINE W REFLEX MICROSCOPIC
Bilirubin Urine: NEGATIVE
Glucose, UA: NEGATIVE mg/dL
Hgb urine dipstick: NEGATIVE
Ketones, ur: NEGATIVE mg/dL
Leukocytes,Ua: NEGATIVE
Nitrite: NEGATIVE
Protein, ur: 30 mg/dL — AB
Specific Gravity, Urine: 1.016 (ref 1.005–1.030)
pH: 6 (ref 5.0–8.0)

## 2024-10-23 LAB — CBC
HCT: 40 % (ref 36.0–46.0)
Hemoglobin: 13.9 g/dL (ref 12.0–15.0)
MCH: 31.2 pg (ref 26.0–34.0)
MCHC: 34.8 g/dL (ref 30.0–36.0)
MCV: 89.9 fL (ref 80.0–100.0)
Platelets: 133 K/uL — ABNORMAL LOW (ref 150–400)
RBC: 4.45 MIL/uL (ref 3.87–5.11)
RDW: 13.1 % (ref 11.5–15.5)
WBC: 5.4 K/uL (ref 4.0–10.5)
nRBC: 0 % (ref 0.0–0.2)

## 2024-10-23 LAB — LIPASE, BLOOD: Lipase: 31 U/L (ref 11–51)

## 2024-10-23 MED ORDER — FENTANYL CITRATE (PF) 50 MCG/ML IJ SOSY
50.0000 ug | PREFILLED_SYRINGE | Freq: Once | INTRAMUSCULAR | Status: AC
  Administered 2024-10-23: 50 ug via INTRAVENOUS
  Filled 2024-10-23: qty 1

## 2024-10-23 MED ORDER — IOHEXOL 300 MG/ML  SOLN
100.0000 mL | Freq: Once | INTRAMUSCULAR | Status: AC | PRN
  Administered 2024-10-23: 100 mL via INTRAVENOUS

## 2024-10-23 NOTE — ED Provider Notes (Signed)
 [place] EMERGENCY DEPARTMENT AT [facility] Provider Note   CSN: [id number] Arrival date & time: 10/23/24  1702     Patient presents with: Abdominal Pain and Hernia   Samantha Evans is a 63 y.o. female.  63 year old female presents to ED with complaints of abdominal pain.  Patient reports that she has a hernia and was evaluated by surgeon last year but he refused to do surgery because she was still smoking.  Patient reports she has had some mild constipation with her last bowel movement yesterday and had some nausea and vomiting last week but has been able to eat food and drink fluids since then.  Patient reports that she was moving firewood today and it worsened the pain in her abdomen.  Patient is currently complaining of abdominal pain and denies any other associated symptoms.     Prior to Admission medications   Medication Sig Start Date End Date Taking? Authorizing Provider  amLODipine  (NORVASC ) 5 MG tablet Take 1 tablet (5 mg total) by mouth daily. 06/30/23   Newlin, Enobong, MD  Elastic Bandages & Supports (ABDOMINAL BINDER/ELASTIC XL) MISC 1 Container by Does not apply route as needed. 07/06/23   Lane Shope, MD  Magnesium 250 MG TABS Take 500 mg by mouth daily.    [provider]  methocarbamol  (ROBAXIN -750) 750 MG tablet Take 1 tablet (750 mg total) by mouth 2 (two) times daily as needed for muscle spasms. 09/01/23   Jule Ronal CROME, PA-C  ondansetron  (ZOFRAN ) 4 MG tablet Take 1 tablet (4 mg total) by mouth every 8 (eight) hours as needed for nausea or vomiting. 09/01/23   Jule Ronal CROME, PA-C  spironolactone  (ALDACTONE ) 50 MG tablet Take 50 mg by mouth daily.    [provider]    Allergies: Patient has no known allergies.    Review of Systems  Gastrointestinal:  Positive for abdominal pain, constipation, nausea and vomiting.  All other systems reviewed and are negative.   Updated Vital Signs BP (!) 161/87 (BP Location: Right Arm)    Pulse 75   Temp 98 F (36.7 C) (Oral)   Resp 20   Ht 5' 1 (1.549 m)   Wt 88.4 kg   SpO2 95%   BMI 36.82 kg/m   Physical Exam Vitals and nursing note reviewed.  Constitutional:      Appearance: Normal appearance. She is well-developed.  HENT:     Head: Normocephalic and atraumatic.     Nose: Nose normal.  Eyes:     Extraocular Movements: Extraocular movements intact.     Conjunctiva/sclera: Conjunctivae normal.     Pupils: Pupils are equal, round, and reactive to light.  Cardiovascular:     Rate and Rhythm: Normal rate.  Pulmonary:     Effort: Pulmonary effort is normal. No respiratory distress.  Abdominal:     General: Bowel sounds are normal.     Palpations: Abdomen is soft.     Tenderness: There is generalized abdominal tenderness. There is no guarding.     Hernia: A hernia is present.     Comments: Obvious palpated hernia in the umbilical region.  No color changes to the abdomen and bowel sounds are present.  Patient reports some mild pain to palpation throughout the abdomen.  Musculoskeletal:        General: Normal range of motion.     Cervical back: Normal range of motion.  Skin:    General: Skin is warm.     Capillary Refill:  Capillary refill takes less than 2 seconds.  Neurological:     General: No focal deficit present.     Mental Status: She is alert.  Psychiatric:        Mood and Affect: Mood normal.        Behavior: Behavior normal.     (all labs ordered are listed, but only abnormal results are displayed) Labs Reviewed  COMPREHENSIVE METABOLIC PANEL WITH GFR - Abnormal; Notable for the following components:      Result Value   Glucose, Bld 102 (*)    Calcium 11.5 (*)    All other components within normal limits  CBC - Abnormal; Notable for the following components:   Platelets 133 (*)    All other components within normal limits  URINALYSIS, ROUTINE W REFLEX MICROSCOPIC - Abnormal; Notable for the following components:   Protein, ur 30 (*)     Bacteria, UA RARE (*)    All other components within normal limits  LIPASE, BLOOD    EKG: None  Radiology: CT ABDOMEN PELVIS W CONTRAST Result Date: 10/23/2024 EXAM: CT ABDOMEN AND PELVIS WITH CONTRAST 10/23/2024 08:58:12 PM TECHNIQUE: CT of the abdomen and pelvis was performed with the administration of 100 mL of iohexol  (OMNIPAQUE ) 300 MG/ML solution. Multiplanar reformatted images are provided for review. Automated exposure control, iterative reconstruction, and/or weight-based adjustment of the mA/kV was utilized to reduce the radiation dose to as low as reasonably achievable. COMPARISON: 06/08/2022 CLINICAL HISTORY: Abdominal pain, acute, nonlocalized; Bowel obstruction suspected; Hernia. FINDINGS: LOWER CHEST: No acute abnormality. LIVER: Nodular contours of the liver compatible with cirrhosis. Scattered hepatic cysts are stable. GALLBLADDER AND BILE DUCTS: Prior cholecystectomy. Mild intrahepatic and extrahepatic biliary ductal dilatation related to post-cholecystectomy state, stable. SPLEEN: Splenomegaly with a craniocaudal length of 15.5 cm. PANCREAS: No acute abnormality. ADRENAL GLANDS: No acute abnormality. KIDNEYS, URETERS AND BLADDER: No stones in the kidneys or ureters. No hydronephrosis. No perinephric or periureteral stranding. Urinary bladder is unremarkable. GI AND BOWEL: Stomach demonstrates no acute abnormality. Sigmoid diverticulosis. Large umbilical or supraumbilical ventral hernia containing multiple small bowel loops and a small amount of fluid. No evidence of bowel obstruction. PERITONEUM AND RETROPERITONEUM: No ascites. No free air. VASCULATURE: Aorta is normal in caliber. Aortic atherosclerosis. LYMPH NODES: No lymphadenopathy. REPRODUCTIVE ORGANS: No acute abnormality. BONES AND SOFT TISSUES: No acute osseous abnormality. No focal soft tissue abnormality. IMPRESSION: 1. Large ventral (umbilical or supraumbilical) hernia containing multiple small bowel loops and a small amount  of fluid, without obstruction. 2. Cirrhosis, associated splenomegaly. Electronically signed by: Franky Crease MD 10/23/2024 09:05 PM EST RP Workstation: [id number]    Procedures   Medications Ordered in the ED  iohexol  (OMNIPAQUE ) 300 MG/ML solution 100 mL (100 mLs Intravenous Contrast Given 10/23/24 2049)  fentaNYL  (SUBLIMAZE ) injection 50 mcg (50 mcg Intravenous Given 10/23/24 2134)   63 y.o. female presents to the ED with complaints of abdominal pain secondary to known hernia,  The differential diagnosis includes but not limited to strangulated hernia, bowel obstruction, pancreatitis, appendicitis, UTI, (Ddx)  On arrival pt is nontoxic, vitals mildly hypertensive no other significant abnormalities on vitals. Exam significant for palpable hernia near umbilicus.  I ordered medication Fentanyl  for pain during reduction  Lab Tests:  CMP lipase CBC UA ordered in triage no concerning findings on labs.  Imaging Studies ordered:  I ordered imaging studies which included CT abdomen pelvis with contrast.  CT resulted and large ventral hernia containing multiple small bowel loops and small amount of fluid  without obstruction.  Also noted cirrhosis and splenomegaly  ED Course:   Patient initially sitting comfortably in ED bed in no acute distress and nontoxic-appearing patient denies any other current associated symptoms but has had some episodes of nausea and vomiting throughout the past week and constipation with recent bowel movement of yesterday.  Patient says the pain started after moving wood and the hernia has been tolerable prior.  Due to large size of hernia CT scan will be ordered to assess.  CT resulted and with no obstruction or strangulation of hernia.  Patient was discussed with attending and he advised he will assess to see if he can reduce the hernia.  Attending was able to reduce the hernia and advised patient would be appropriate for outpatient follow-up with general surgery.  Patient  was advised of follow-up and advised of strict return precautions.  Patient was discharged at this time.   Portions of this note were generated with Scientist, clinical (histocompatibility and immunogenetics). Dictation errors may occur despite best attempts at proofreading.   Final diagnoses:  Hernia of anterior abdominal wall    ED Discharge Orders     None          Myriam Fonda GORMAN DEVONNA 10/23/24 2242    Emil Share, DO 10/23/24 2344

## 2024-10-23 NOTE — ED Notes (Signed)
 First poc with pt. Pt axox4. GCS 15.  Pt requests restroom.  Pt assisted to and from restroom with standby assist.  Noted slow, but steady gait.  Pt provided urine specimen. Pt c/o pain 7/10.  No respiratory distress noted at this time.  Pt reconnected to continuous bp and Oxygen saturation

## 2024-10-23 NOTE — Discharge Instructions (Addendum)
 Imaging was concerning for hernia, it was reduced today in the ED.  Please follow-up with the general surgeons information that we have attached your discharge paperwork.  Please call them as soon as possible to establish an appointment. It is important to monitor for any worsening symptoms including nausea, vomiting, severe abdominal pain, worsening constipation.  If any of these or other concerning symptoms arise please return to ED for further evaluation.

## 2024-10-23 NOTE — ED Triage Notes (Signed)
 PT arrives via EMS from home with complaints of abdominal pain. Pt reports a hx of a hernia, that is now aggravated after lifting firewood today. Pt states that she needs to have another hernia repair, and has to stop smoking first.

## 2024-10-23 NOTE — ED Notes (Signed)
 Pt axox4. GCS 15. PT verbalizes understanding of discharge instructions, follow up, rx. PT escorted out via wheelchair by NT to waiting room to await transport home.

## 2024-10-23 NOTE — ED Notes (Signed)
 PT provided sandwich and drink.  Called significant other for safe transportation home.  Pt awaiting ride.

## 2024-10-30 DIAGNOSIS — K439 Ventral hernia without obstruction or gangrene: Secondary | ICD-10-CM | POA: Diagnosis not present

## 2024-10-30 DIAGNOSIS — K7469 Other cirrhosis of liver: Secondary | ICD-10-CM | POA: Diagnosis not present

## 2024-10-30 NOTE — Progress Notes (Signed)
 "   REFERRING PHYSICIAN:  Self  PROVIDER:  DONNICE DEWAYNE LIMA, MD  MRN: [id number] DOB: 05-18-1961 DATE OF ENCOUNTER: 10/30/2024  Subjective   Chief Complaint:Ventral hernia     History of Present Illness: Samantha Evans is a 63 y.o. female who is seen today as an office consultation at the request of Dr. Enobong Amao for evaluation of recurrent ventral hernia.  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  She presented to the emergency department in April 2022 with an incarcerated umbilical hernia containing a loop of small bowel.  Dr. Dann Hummer operated on her emergently.  No mesh was used due to the incarcerated bowel.  He closed the hernia primarily with interrupted #1 Novafil sutures.  She developed a recurrence in this area.  She was evaluated by a careers adviser at [facility] in 2023.  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  She never followed up regarding her hernia.  It has become larger and more uncomfortable.  She had some nausea and vomiting prior to the ER visit but they were able to reduce her hernia.  CT scan was obtained that showed a large recurrent ventral hernia containing several loops of small bowel without sign of obstruction.   Review of Systems: A complete review of systems was obtained from the patient.  I have reviewed this information and discussed as appropriate with the patient.  See HPI as well for other ROS.  Review of Systems  Constitutional: Negative.   HENT: Negative.    Eyes: Negative.   Respiratory: Negative.    Cardiovascular: Negative.   Gastrointestinal:  Positive for abdominal pain, nausea and vomiting.  Genitourinary: Negative.   Musculoskeletal: Negative.   Skin: Negative.   Neurological: Negative.   Endo/Heme/Allergies: Negative.   Psychiatric/Behavioral: Negative.         Medical History: Past Medical History:  Diagnosis Date   Arthritis    Carpal tunnel syndrome    Cataracts, bilateral    Cirrhosis (CMS/HHS-HCC)    Hepatitis C    Hypertension    Small bowel obstruction (CMS/HHS-HCC)    Substance abuse (CMS-HCC)     Patient Active Problem List  Diagnosis   Acid reflux   AKI (acute kidney injury)   Chronic hepatitis C without hepatic coma (CMS/HHS-HCC)   Hepatic cirrhosis (CMS/HHS-HCC)   Papilloma of right breast   Peripheral edema   Status post total right knee replacement   Ventral hernia, recurrent    Past Surgical History:  Procedure Laterality Date   Umbilical hernia repair (N/A  02/27/2021   Breast biopsy with radio frequency localizer (Right)  08/12/2022   Total knee arthroplasty (Right)  09/06/2023   CATARACT EXTRACTION W/ INTRAOCULAR LENS IMPLANT & ANTERIOR VITRECTOMY, BILATERAL     CHOLECYSTECTOMY     COLONOSCOPY     ERCP       No Known Allergies  Current Outpatient Medications on File Prior to Visit  Medication Sig Dispense Refill   amLODIPine  (NORVASC ) 5 MG tablet Take 5 mg by mouth once daily (Patient not taking: Reported on 10/30/2024)     magnesium 250 mg Tab Take 500 mg by mouth once daily (Patient not taking: Reported on 10/30/2024)     spironolactone  (ALDACTONE ) 50 MG tablet Take 50 mg by mouth once daily (Patient not taking: Reported on 10/30/2024)     No current facility-administered  medications on file prior to visit.    Family History  Problem Relation Age of Onset   Obesity Mother      Social History   Tobacco Use  Smoking Status Every Day   Types: Cigarettes   Passive exposure: Past  Smokeless Tobacco Never     Social History   Socioeconomic History   Marital status: Married  Tobacco Use   Smoking status: Every Day    Types: Cigarettes    Passive exposure: Past   Smokeless tobacco: Never  Vaping Use   Vaping status: Unknown  Substance and Sexual  Activity   Alcohol use: Never   Drug use: Never   Social Drivers of Corporate Investment Banker Strain: Medium Risk (03/25/2023)   Received from [facility]   Overall Financial Resource Strain (CARDIA)    Difficulty of Paying Living Expenses: Somewhat hard  Food Insecurity: Food Insecurity Present (03/25/2023)   Received from [facility]   Hunger Vital Sign    Within the past 12 months, you worried that your food would run out before you got the money to buy more.: Often true    Within the past 12 months, the food you bought just didn't last and you didn't have money to get more.: Often true  Transportation Needs: No Transportation Needs (03/25/2023)   Received from [facility] - Transportation    Lack of Transportation (Medical): No    Lack of Transportation (Non-Medical): No  Physical Activity: Unknown (03/25/2023)   Received from [facility]   Exercise Vital Sign    On average, how many days per week do you engage in moderate to strenuous exercise (like a brisk walk)?: Patient declined  Stress: No Stress Concern Present (03/25/2023)   Received from [facility] - Occupational Stress Questionnaire    Feeling of Stress : Only a little  Social Connections: Unknown (03/25/2023)   Received from [facility]   Social Connection and Isolation Panel    In a typical week, how many times do you talk on the phone with family, friends, or neighbors?: Three times a week    How often do you get together with friends or relatives?: Three times a week    How often do you attend church or religious services?: Patient declined    Do you belong to any clubs or organizations such as church groups, unions, fraternal or athletic groups, or school groups?: No    Are you married, widowed, divorced, separated, never married, or living with a partner?: Separated    Objective:    Vitals:   10/30/24 1027 10/30/24 1028  BP: (!) 171/94   Pulse: 95    Resp: 16   Temp: 36.8 C (98.3 F)   SpO2: 98%   Weight: 99.8 kg (220 lb)   Height: 154.9 cm (5' 1)   PainSc:    3    Body mass index is 41.57 kg/m.  Physical Exam   Constitutional:  WDWN in NAD, conversant, no obvious deformities; lying in bed comfortably Eyes:  Pupils equal, round; sclera anicteric; moist conjunctiva; no lid lag HENT:  Oral mucosa moist; good dentition  Neck:  No masses palpated, trachea midline; no thyromegaly Lungs:  CTA bilaterally; normal respiratory effort CV:  Regular rate and rhythm; no murmurs; extremities well-perfused with no edema Abd:  +bowel sounds, soft, non-tender, no palpable organomegaly; healed periumbilical incision with a large protruding central abdominal ventral hernia.  When  she is relaxed and supine it is mostly reducible.  The fascial defect is approximately 7 cm in diameter. Musc: Normal gait; no apparent clubbing or cyanosis in extremities Lymphatic:  No palpable cervical or axillary lymphadenopathy Skin:  Warm, dry; no sign of jaundice Psychiatric - alert and oriented x 4; calm mood and affect   Labs, Imaging and Diagnostic Testing: EXAM: CT ABDOMEN AND PELVIS WITH CONTRAST 10/23/2024 08:58:12 PM   TECHNIQUE: CT of the abdomen and pelvis was performed with the administration of 100 mL of iohexol  (OMNIPAQUE ) 300 MG/ML solution. Multiplanar reformatted images are provided for review. Automated exposure control, iterative reconstruction, and/or weight-based adjustment of the mA/kV was utilized to reduce the radiation dose to as low as reasonably achievable.   COMPARISON: 06/08/2022   CLINICAL HISTORY: Abdominal pain, acute, nonlocalized; Bowel obstruction suspected; Hernia.   FINDINGS:   LOWER CHEST: No acute abnormality.   LIVER: Nodular contours of the liver compatible with cirrhosis. Scattered hepatic cysts are stable.   GALLBLADDER AND BILE DUCTS: Prior cholecystectomy. Mild intrahepatic and extrahepatic biliary  ductal dilatation related to post-cholecystectomy state, stable.   SPLEEN: Splenomegaly with a craniocaudal length of 15.5 cm.   PANCREAS: No acute abnormality.   ADRENAL GLANDS: No acute abnormality.   KIDNEYS, URETERS AND BLADDER: No stones in the kidneys or ureters. No hydronephrosis. No perinephric or periureteral stranding. Urinary bladder is unremarkable.   GI AND BOWEL: Stomach demonstrates no acute abnormality. Sigmoid diverticulosis. Large umbilical or supraumbilical ventral hernia containing multiple small bowel loops and a small amount of fluid. No evidence of bowel obstruction.   PERITONEUM AND RETROPERITONEUM: No ascites. No free air.   VASCULATURE: Aorta is normal in caliber. Aortic atherosclerosis.   LYMPH NODES: No lymphadenopathy.   REPRODUCTIVE ORGANS: No acute abnormality.   BONES AND SOFT TISSUES: No acute osseous abnormality. No focal soft tissue abnormality.   IMPRESSION: 1. Large ventral (umbilical or supraumbilical) hernia containing multiple small bowel loops and a small amount of fluid, without obstruction. 2. Cirrhosis, associated splenomegaly.   Electronically signed by: Franky Crease MD 10/23/2024 09:05 PM EST RP Workstation: [id number]    Assessment and Plan:  Diagnoses and all orders for this visit:  Recurrent ventral hernia  Cirrhosis, non-alcoholic (CMS/HHS-HCC)    Patient has a large recurrence of her ventral hernia.  She continues to abuse tobacco which is very likely contributing to the failure of her previous repair and the enlarging hernia.  I reiterated the importance of smoking cessation to the patient in order to have accessible hernia repair with mesh.  Her surgical management will also be complicated by her nonalcoholic cirrhosis diagnosed by appearance on CT scan.  Her liver function tests are all normal.  She denies any alcohol use.  There is no indications for urgent surgical intervention at this time.  We again  discussed smoking cessation.  I referred the patient to one of our ventral hernia specialist in our practice for further evaluation.  She may also use an abdominal binder for temporary relief of her symptoms. DONNICE DEWAYNE LIMA, MD  10/30/2024 12:18 PM   "

## 2024-12-08 ENCOUNTER — Ambulatory Visit: Payer: Self-pay | Admitting: General Surgery

## 2024-12-08 NOTE — H&P (Unsigned)
 " Chief Complaint: No chief complaint on file.       History of Present Illness: Samantha Evans is a 64 y.o. female who is seen today as an office consultation at the request of Dr. Arch for evaluation of No chief complaint on file. Samantha Evans   History of Present Illness Samantha Evans is a 64 year old female with a large recurrent incisional hernia who presents for surgical consultation and preoperative evaluation.   She has had a large abdominal hernia for at least three weeks that intermittently protrudes and reduces. It pops up and pops right down, and she uses manual pressure to reduce it.   She quit smoking three weeks ago and has no current tobacco use or exposure. She heats her home with wood and lifts pieces weighing about 15 to 25 pounds.   She denies heart, kidney, or lung disease and is not taking anticoagulants.          Review of Systems: A complete review of systems was obtained from the patient.  I have reviewed this information and discussed as appropriate with the patient.  See HPI as well for other ROS.   ROS      Medical History: Past Medical History      Past Medical History:  Diagnosis Date   Arthritis     Carpal tunnel syndrome     Cataracts, bilateral     Cirrhosis (CMS/HHS-HCC)     Hepatitis C     Hypertension     Small bowel obstruction (CMS/HHS-HCC)     Substance abuse (CMS-HCC)          Problem List     Patient Active Problem List  Diagnosis   Acid reflux   AKI (acute kidney injury)   Chronic hepatitis C without hepatic coma (CMS/HHS-HCC)   Hepatic cirrhosis (CMS/HHS-HCC)   Papilloma of right breast   Peripheral edema   Status post total right knee replacement   Ventral hernia, recurrent        Past Surgical History       Past Surgical History:  Procedure Laterality Date   Umbilical hernia repair (N/A   02/27/2021   Breast biopsy with radio frequency localizer (Right)   08/12/2022   Total knee arthroplasty (Right)   09/06/2023    CATARACT EXTRACTION W/ INTRAOCULAR LENS IMPLANT & ANTERIOR VITRECTOMY, BILATERAL       CHOLECYSTECTOMY       COLONOSCOPY       ERCP            Allergies  No Known Allergies     Medications Ordered Prior to Encounter        Current Outpatient Medications on File Prior to Visit  Medication Sig Dispense Refill   amLODIPine  (NORVASC ) 5 MG tablet Take 5 mg by mouth once daily (Patient not taking: Reported on 11/17/2024)       magnesium 250 mg Tab Take 500 mg by mouth once daily (Patient not taking: Reported on 11/17/2024)       spironolactone  (ALDACTONE ) 50 MG tablet Take 50 mg by mouth once daily (Patient not taking: Reported on 11/17/2024)        No current facility-administered medications on file prior to visit.        Family History       Family History  Problem Relation Age of Onset   Obesity Mother          Tobacco Use History  Social History  Tobacco Use  Smoking Status Every Day   Types: Cigarettes   Passive exposure: Past  Smokeless Tobacco Never        Social History  Social History         Socioeconomic History   Marital status: Married  Tobacco Use   Smoking status: Every Day      Types: Cigarettes      Passive exposure: Past   Smokeless tobacco: Never  Vaping Use   Vaping status: Unknown  Substance and Sexual Activity   Alcohol use: Never   Drug use: Never    Social Drivers of Acupuncturist Strain: Medium Risk (03/25/2023)    Received from [facility]    Overall Financial Resource Strain (CARDIA)     Difficulty of Paying Living Expenses: Somewhat hard  Food Insecurity: Food Insecurity Present (03/25/2023)    Received from [facility]    Hunger Vital Sign     Within the past 12 months, you worried that your food would run out before you got the money to buy more.: Often true     Within the past 12 months, the food you bought just didn't last and you didn't have money to get more.: Often true  Transportation Needs: No  Transportation Needs (03/25/2023)    Received from [facility] - Transportation     Lack of Transportation (Medical): No     Lack of Transportation (Non-Medical): No  Physical Activity: Unknown (03/25/2023)    Received from [facility]    Exercise Vital Sign     On average, how many days per week do you engage in moderate to strenuous exercise (like a brisk walk)?: Patient declined  Stress: No Stress Concern Present (03/25/2023)    Received from [facility] - Occupational Stress Questionnaire     Feeling of Stress : Only a little  Social Connections: Unknown (03/25/2023)    Received from [facility]    Social Connection and Isolation Panel     In a typical week, how many times do you talk on the phone with family, friends, or neighbors?: Three times a week     How often do you get together with friends or relatives?: Three times a week     How often do you attend church or religious services?: Patient declined     Do you belong to any clubs or organizations such as church groups, unions, fraternal or athletic groups, or school groups?: No     Are you married, widowed, divorced, separated, never married, or living with a partner?: Separated        Objective:      There were no vitals filed for this visit.  There is no height or weight on file to calculate BMI.   Physical Exam Constitutional:      Appearance: Normal appearance.  HENT:     Head: Normocephalic and atraumatic.     Mouth/Throat:     Mouth: Mucous membranes are moist.     Pharynx: Oropharynx is clear.  Eyes:     General: No scleral icterus.    Pupils: Pupils are equal, round, and reactive to light.  Cardiovascular:     Rate and Rhythm: Normal rate and regular rhythm.     Pulses: Normal pulses.     Heart sounds: No murmur heard.    No friction rub. No gallop.  Pulmonary:  Effort: Pulmonary effort is normal. No respiratory distress.     Breath sounds: Normal  breath sounds. No stridor.  Abdominal:     General: Abdomen is flat.   Musculoskeletal:        General: No swelling.  Skin:    General: Skin is warm.  Neurological:     General: No focal deficit present.     Mental Status: She is alert and oriented to person, place, and time. Mental status is at baseline.  Psychiatric:        Mood and Affect: Mood normal.        Thought Content: Thought content normal.        Judgment: Judgment normal.       Hernia Size:8cm Incarcerated: YES Initial Hernia   Assessment and Plan:  There are no diagnoses linked to this encounter.  Samantha Evans is a 64 y.o. female      Assessment & Plan Will plan on open incisional hernia repair with mesh likely retrorectus vs TAR   All risks and benefits were discussed with the patient, to generally include infection, bleeding, damage to surrounding structures, acute and chronic nerve pain, and recurrence. Alternatives were offered and described.  All questions were answered and the patient voiced understanding of the procedure and wishes to proceed at this point.     Tobacco use Abstinent from tobacco since last visit. Continued cessation critical to minimize postoperative infection risk and optimize outcomes. - Reinforced importance of ongoing smoking cessation to reduce surgical and infectious complications. - Encouraged her efforts and discussed strategies to maintain a smoke-free environment.           No follow-ups on file.   Lynda Leos, MD, [facility] "

## 2025-01-08 ENCOUNTER — Ambulatory Visit (HOSPITAL_COMMUNITY): Admit: 2025-01-08 | Admitting: General Surgery
